# Patient Record
Sex: Male | Born: 1975 | Race: Black or African American | Hispanic: No | Marital: Married | Smoking: Never smoker
Health system: Southern US, Community
[De-identification: ages and names within clinical notes are randomized; demographics above are authoritative.]

## PROBLEM LIST (undated history)

## (undated) DIAGNOSIS — I10 Essential (primary) hypertension: Secondary | ICD-10-CM

## (undated) DIAGNOSIS — J45909 Unspecified asthma, uncomplicated: Secondary | ICD-10-CM

## (undated) DIAGNOSIS — F32A Depression, unspecified: Secondary | ICD-10-CM

## (undated) DIAGNOSIS — K2100 Gastro-esophageal reflux disease with esophagitis, without bleeding: Secondary | ICD-10-CM

## (undated) DIAGNOSIS — F319 Bipolar disorder, unspecified: Secondary | ICD-10-CM

## (undated) DIAGNOSIS — K219 Gastro-esophageal reflux disease without esophagitis: Secondary | ICD-10-CM

## (undated) DIAGNOSIS — F419 Anxiety disorder, unspecified: Secondary | ICD-10-CM

## (undated) HISTORY — PX: NO PAST SURGERIES: SHX2092

## (undated) HISTORY — DX: Gastro-esophageal reflux disease with esophagitis, without bleeding: K21.00

---

## 2012-02-12 ENCOUNTER — Encounter (HOSPITAL_COMMUNITY): Payer: Self-pay | Admitting: *Deleted

## 2012-02-12 ENCOUNTER — Emergency Department (HOSPITAL_COMMUNITY)
Admission: EM | Admit: 2012-02-12 | Discharge: 2012-02-12 | Disposition: A | Discharge status: Home / Self Care | Payer: No Typology Code available for payment source | Attending: Emergency Medicine | Admitting: Emergency Medicine

## 2012-02-12 ENCOUNTER — Emergency Department (HOSPITAL_COMMUNITY): Payer: No Typology Code available for payment source

## 2012-02-12 DIAGNOSIS — Z043 Encounter for examination and observation following other accident: Secondary | ICD-10-CM | POA: Insufficient documentation

## 2012-02-12 MED ORDER — IBUPROFEN 400 MG PO TABS
800.0000 mg | ORAL_TABLET | Freq: Once | ORAL | Status: AC
  Administered 2012-02-12: 800 mg via ORAL
  Filled 2012-02-12: qty 2
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
## 2012-02-12 NOTE — ED Notes (Signed)
Pt returned from xray pain med given
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
## 2013-02-12 ENCOUNTER — Emergency Department (HOSPITAL_COMMUNITY)
Admission: EM | Admit: 2013-02-12 | Discharge: 2013-02-12 | Disposition: A | Discharge status: Home / Self Care | Payer: Self-pay | Attending: Emergency Medicine | Admitting: Emergency Medicine

## 2013-02-12 ENCOUNTER — Encounter (HOSPITAL_COMMUNITY): Payer: Self-pay | Admitting: *Deleted

## 2013-02-12 DIAGNOSIS — H9201 Otalgia, right ear: Secondary | ICD-10-CM

## 2013-02-12 DIAGNOSIS — S335XXA Sprain of ligaments of lumbar spine, initial encounter: Secondary | ICD-10-CM | POA: Insufficient documentation

## 2013-02-12 DIAGNOSIS — K029 Dental caries, unspecified: Secondary | ICD-10-CM

## 2013-02-12 DIAGNOSIS — H9209 Otalgia, unspecified ear: Secondary | ICD-10-CM | POA: Insufficient documentation

## 2013-02-12 DIAGNOSIS — Y9229 Other specified public building as the place of occurrence of the external cause: Secondary | ICD-10-CM | POA: Insufficient documentation

## 2013-02-12 DIAGNOSIS — S29012A Strain of muscle and tendon of back wall of thorax, initial encounter: Secondary | ICD-10-CM

## 2013-02-12 DIAGNOSIS — X503XXA Overexertion from repetitive movements, initial encounter: Secondary | ICD-10-CM | POA: Insufficient documentation

## 2013-02-12 DIAGNOSIS — Y990 Civilian activity done for income or pay: Secondary | ICD-10-CM | POA: Insufficient documentation

## 2013-02-12 MED ORDER — TRAMADOL HCL 50 MG PO TABS: 50.0000 mg | ORAL_TABLET | Freq: Four times a day (QID) | ORAL | Status: DC | PRN

## 2013-02-12 MED ORDER — CYCLOBENZAPRINE HCL 10 MG PO TABS: 10.0000 mg | ORAL_TABLET | Freq: Two times a day (BID) | ORAL | Status: DC | PRN

## 2013-02-12 MED ORDER — IBUPROFEN 600 MG PO TABS: 600.0000 mg | ORAL_TABLET | Freq: Four times a day (QID) | ORAL | Status: DC | PRN

## 2013-02-12 MED ORDER — AMOXICILLIN 500 MG PO CAPS: 500.0000 mg | ORAL_CAPSULE | Freq: Three times a day (TID) | ORAL | Status: DC

## 2013-02-12 NOTE — ED Notes (Signed)
Pt is in no distress 

## 2013-02-12 NOTE — ED Provider Notes (Signed)
CSN: [id number]     Arrival date & time 02/12/13  1331 History   First MD Initiated Contact with Patient 02/12/13 1510     Chief Complaint  Patient presents with  . Otalgia  . Back Pain   (Consider location/radiation/quality/duration/timing/severity/associated sxs/prior Treatment) HPI Jordan [name] is a 37 y.o. male  presented to emergency department with complaint of a right earache and left upper back pain. Patient states his ear ache began 4 days ago. States it feels like pressure. Patient denies any injury or drainage out of the ear. Patient denies any fever, chills, any other URI symptoms. Patient denies any being in his ears. Patient denies any headache. Patient denies any dental pain or sore throat. Patient states that he took some Tylenol with no relief. He should also states he's been having left upper and mid back pain for the past month. Patient states the back pain does not radiate. It is not midline. States it is worsened with movement. States he lifts boxes up to 50 pounds at work daily. He denies any known injury. Patient denies any numbness or weakness in his extremities. Patient denies any loss of bowels or bladder function. Patient denies any fever, chills malaise. History reviewed. No pertinent past medical history. History reviewed. No pertinent past surgical history. History reviewed. No pertinent family history. History  Substance Use Topics  . Smoking status: Never Smoker   . Smokeless tobacco: Not on file  . Alcohol Use: Yes     Comment: occ    Review of Systems  Constitutional: Negative for fever and chills.  HENT: Positive for ear pain. Negative for sore throat, neck pain, neck stiffness and dental problem.   Respiratory: Negative for cough, chest tightness and shortness of breath.   Cardiovascular: Negative for chest pain, palpitations and leg swelling.  Gastrointestinal: Negative for nausea, vomiting, abdominal pain, diarrhea and abdominal distention.   Genitourinary: Negative for dysuria, urgency, frequency, hematuria and flank pain.  Musculoskeletal: Positive for back pain.  Skin: Negative for rash.  Allergic/Immunologic: Negative for immunocompromised state.  Neurological: Negative for dizziness, weakness, light-headedness, numbness and headaches.    Allergies  Review of patient's allergies indicates no known allergies.  Home Medications  No current outpatient prescriptions on file. BP 140/97  Pulse 62  Temp(Src) 97.8 F (36.6 C) (Oral)  Resp 18  SpO2 99% Physical Exam  Nursing note and vitals reviewed. Constitutional: He appears well-developed and well-nourished. No distress.  HENT:  Head: Normocephalic.  Right Ear: External ear normal.  Left Ear: External ear normal.  Nose: Nose normal.  Mouth/Throat: Oropharynx is clear and moist.  Ear canals and tympanic membranes are normal bilaterally. Partially broken off and decayed right lower third molar. No surrounding gum swelling or erythema.  Neck: Normal range of motion. Neck supple.  Cardiovascular: Normal rate, regular rhythm and normal heart sounds.   Pulmonary/Chest: Effort normal and breath sounds normal. No respiratory distress. He has no wheezes. He has no rales.  Abdominal: Soft.  Musculoskeletal:  No midline thoracic or lumbar spine tenderness. Patient is tender over left trapezius and left parascapular muscles. There is no bruising, swelling, erythema. No pain with range of motion of left shoulder.  Neurological:  5/5 and equal lower extremity strength. 2+ and equal patellar reflexes bilaterally. Pt able to dorsiflex bilateral toes and feet with good strength against resistance. Equal sensation bilaterally over thighs and lower legs.   Skin: Skin is warm and dry.    ED Course  Procedures (including critical  care time) Labs Review Labs Reviewed - No data to display Imaging Review No results found.  MDM   1. Ear pain, right   2. Dental cavity   3. Upper  back strain, initial encounter    Patient with 2 complaints. He is having right ear pain and left upper back pain. Ear appears normal, he does have a cavity in his right lower third molar tooth and this could be a referred pain from the tooth. He does not have any facial swelling or any signs of an obvious abscess. Afebrile. He is nontoxic appearing. There are no red flags suggest cauda equina. His back pain is most likely musculoskeletal. I will start him on amoxicillin to cover for possible dental infection. We'll prescribe him pain medication, muscle relaxant,  and instructed him to followup closely with pcp and a dentist.   Filed Vitals:   02/12/13 1359  BP: 140/97  Pulse: 62  Temp: 97.8 F (36.6 C)  Resp: 18       [name] A Robyn Nohr, PA-C 02/13/13 0015

## 2013-02-12 NOTE — ED Notes (Signed)
Pt reports left side lower back pain that radiates up to mid back, has been hurting x1 month. And now also having right ear pain since Monday. Ambulatory, no acute distress noted at triage.

## 2013-02-13 NOTE — ED Provider Notes (Signed)
Medical screening examination/treatment/procedure(s) were performed by non-physician practitioner and as supervising physician I was immediately available for consultation/collaboration.  Adasha Boehme N Cadie Sorci, DO 02/13/13 1458 

## 2013-03-15 ENCOUNTER — Emergency Department (HOSPITAL_COMMUNITY)
Admission: EM | Admit: 2013-03-15 | Discharge: 2013-03-15 | Disposition: A | Discharge status: Home / Self Care | Payer: No Typology Code available for payment source | Attending: Emergency Medicine | Admitting: Emergency Medicine

## 2013-03-15 ENCOUNTER — Encounter (HOSPITAL_COMMUNITY): Payer: Self-pay | Admitting: Emergency Medicine

## 2013-03-15 DIAGNOSIS — H6983 Other specified disorders of Eustachian tube, bilateral: Secondary | ICD-10-CM

## 2013-03-15 DIAGNOSIS — H6980 Other specified disorders of Eustachian tube, unspecified ear: Secondary | ICD-10-CM | POA: Insufficient documentation

## 2013-03-15 DIAGNOSIS — H9203 Otalgia, bilateral: Secondary | ICD-10-CM

## 2013-03-15 DIAGNOSIS — M549 Dorsalgia, unspecified: Secondary | ICD-10-CM

## 2013-03-15 DIAGNOSIS — M545 Low back pain, unspecified: Secondary | ICD-10-CM | POA: Insufficient documentation

## 2013-03-15 DIAGNOSIS — H9209 Otalgia, unspecified ear: Secondary | ICD-10-CM | POA: Insufficient documentation

## 2013-03-15 DIAGNOSIS — H6990 Unspecified Eustachian tube disorder, unspecified ear: Secondary | ICD-10-CM | POA: Insufficient documentation

## 2013-03-15 DIAGNOSIS — Z79899 Other long term (current) drug therapy: Secondary | ICD-10-CM | POA: Insufficient documentation

## 2013-03-15 MED ORDER — HYDROCODONE-ACETAMINOPHEN 5-325 MG PO TABS: 1.0000 | ORAL_TABLET | Freq: Four times a day (QID) | ORAL | Status: DC | PRN

## 2013-03-15 NOTE — ED Provider Notes (Signed)
Medical screening examination/treatment/procedure(s) were performed by non-physician practitioner and as supervising physician I was immediately available for consultation/collaboration.  Tekeya Geffert R. Jeziah Kretschmer, MD 03/15/13 1622 

## 2013-03-15 NOTE — ED Notes (Signed)
Reports having lower back pain, right ear pain and now left ear pain. No distress noted.

## 2013-03-15 NOTE — ED Provider Notes (Signed)
CSN: [id number]     Arrival date & time 03/15/13  1113 History  This chart was scribed for non-physician practitioner, Arthor Captain, PA-C working with Juliet Rude. Rubin Payor, MD by Greggory Stallion, ED scribe. This patient was seen in room TR08C/TR08C and the patient's care was started at 12:11 PM.   Chief Complaint  Patient presents with  . Back Pain  . Otalgia   The history is provided by the patient. No language interpreter was used.   HPI Comments: Jordan [name] is a 37 y.o. male who presents to the Emergency Department complaining of bilateral throbbing otalgia that started 3 weeks ago. Pt has trachel pressure. He states it started in the right ear and moved to the left ear last night. Pt also has aching lower back pain and was previously seen for it and given tramadol. The pain radiates into his buttock and up his back. Pt denies hearing loss, congestion and rhinorrhea.   History reviewed. No pertinent past medical history. History reviewed. No pertinent past surgical history. History reviewed. No pertinent family history. History  Substance Use Topics  . Smoking status: Never Smoker   . Smokeless tobacco: Not on file  . Alcohol Use: Yes     Comment: occ    Review of Systems  HENT: Positive for ear pain. Negative for congestion, hearing loss and rhinorrhea.   Musculoskeletal: Positive for back pain.    Allergies  Review of patient's allergies indicates no known allergies.  Home Medications   Current Outpatient Rx  Name  Route  Sig  Dispense  Refill  . Aspirin-Caffeine (BAYER BACK & BODY PAIN EX ST) 500-32.5 MG TABS   Oral   Take 2 tablets by mouth every 6 (six) hours as needed (pain).         . cyclobenzaprine (FLEXERIL) 10 MG tablet   Oral   Take 1 tablet (10 mg total) by mouth 2 (two) times daily as needed for muscle spasms.   20 tablet   0    BP 124/77  Pulse 69  Temp(Src) 97.5 F (36.4 C) (Oral)  Resp 18  Ht 5\' 8"  (1.727 m)  Wt 165 lb (74.844 kg)  BMI  25.09 kg/m2  SpO2 98%  Physical Exam  Nursing note and vitals reviewed. Constitutional: He is oriented to person, place, and time. He appears well-developed and well-nourished. No distress.  HENT:  Head: Normocephalic and atraumatic.  Right Ear: Tympanic membrane, external ear and ear canal normal.  Left Ear: Tympanic membrane, external ear and ear canal normal.  Mouth/Throat: Oropharynx is clear and moist and mucous membranes are normal.  Eyes: EOM are normal.  Neck: Neck supple. No tracheal deviation present.  Cardiovascular: Normal rate.   Pulmonary/Chest: Effort normal. No respiratory distress.  Musculoskeletal: Normal range of motion.  Lymphadenopathy:    He has no cervical adenopathy.  Neurological: He is alert and oriented to person, place, and time.  Skin: Skin is warm and dry.  Psychiatric: He has a normal mood and affect. His behavior is normal.    ED Course  Procedures (including critical care time)  DIAGNOSTIC STUDIES: Oxygen Saturation is 98% on RA, normal by my interpretation.    COORDINATION OF CARE: 12:16 PM-Discussed treatment plan which includes pain medication with pt at bedside and pt agreed to plan. Advised pt to follow up with [facility].   Labs Review Labs Reviewed - No data to display Imaging Review No results found.  EKG Interpretation   None  MDM   1. Acute otalgia, bilateral   2. Eustachian tube dysfunction, bilateral   3. Back pain    Pt with bilateral otalgia. Suspect eustation tube dysfunction. No signs of otitis. No neck stiffness, signs of meningitis or mastoiditis. Will treat ear pain with opiate. Follow up with [facility] regarding chronic back pain with specialist follow up.     I personally performed the services described in this documentation, which was scribed in my presence. The recorded information has been reviewed and is accurate.    Arthor Captain, PA-C 03/15/13 1432

## 2014-04-25 ENCOUNTER — Encounter (HOSPITAL_COMMUNITY): Payer: Self-pay | Admitting: *Deleted

## 2014-04-25 ENCOUNTER — Emergency Department (HOSPITAL_COMMUNITY)
Admission: EM | Admit: 2014-04-25 | Discharge: 2014-04-25 | Disposition: A | Discharge status: Home / Self Care | Payer: No Typology Code available for payment source | Attending: Emergency Medicine | Admitting: Emergency Medicine

## 2014-04-25 DIAGNOSIS — Z79899 Other long term (current) drug therapy: Secondary | ICD-10-CM | POA: Diagnosis not present

## 2014-04-25 DIAGNOSIS — M25562 Pain in left knee: Secondary | ICD-10-CM | POA: Insufficient documentation

## 2014-04-25 DIAGNOSIS — R21 Rash and other nonspecific skin eruption: Secondary | ICD-10-CM | POA: Diagnosis not present

## 2014-04-25 DIAGNOSIS — Y998 Other external cause status: Secondary | ICD-10-CM | POA: Insufficient documentation

## 2014-04-25 DIAGNOSIS — Y9289 Other specified places as the place of occurrence of the external cause: Secondary | ICD-10-CM | POA: Diagnosis not present

## 2014-04-25 DIAGNOSIS — R05 Cough: Secondary | ICD-10-CM | POA: Diagnosis not present

## 2014-04-25 DIAGNOSIS — Y9389 Activity, other specified: Secondary | ICD-10-CM | POA: Insufficient documentation

## 2014-04-25 DIAGNOSIS — S39012A Strain of muscle, fascia and tendon of lower back, initial encounter: Secondary | ICD-10-CM | POA: Diagnosis not present

## 2014-04-25 DIAGNOSIS — X58XXXA Exposure to other specified factors, initial encounter: Secondary | ICD-10-CM | POA: Insufficient documentation

## 2014-04-25 DIAGNOSIS — M545 Low back pain: Secondary | ICD-10-CM | POA: Diagnosis present

## 2014-04-25 MED ORDER — PREDNISONE 20 MG PO TABS: ORAL_TABLET | ORAL | Status: DC

## 2014-04-25 NOTE — ED Notes (Signed)
Declined W/C at D/C and was escorted to lobby by RN. 

## 2014-04-25 NOTE — ED Provider Notes (Signed)
CSN: [id number]     Arrival date & time 04/25/14  1057 History  This chart was scribed for Raymon MuttonMarissa Milena Liggett, PA-C with Gilda Creasehristopher J. Pollina, MD by Tonye RoyaltyJoshua Chen, ED Scribe. This patient was seen in room TR10C/TR10C and the patient's care was started at 12:37 PM.    Chief Complaint  Patient presents with  . Back Pain  . Leg Pain   HPI  HPI Comments: Jordan [name] is a 38 y.o. male without significant medical history who presents to the Emergency Department complaining of pressure-like, throbbing, non-radiating low back pain with onset 1 month ago. He states it is worse with standing and walking. He reports a moderate amount of heavy lifting at work and notes he wears  a back brace. He denies recent falls or injury. He denies history of kidney stones. He states he had similar back pain 9 months ago, but not as severe. He states he has been using ASA only at home for his pain. He denies incontinence, dysuria, hematuria, or tenderness to touch.  He also complains of throbbing, non-radiating pain behind his left knee with onset 1 month ago. He states he noticed it spontaneously while at work. He denies recent injury, falls, or long travel. He denies history of blood clots. He denies swelling to his left leg, numbness/tingling to his leg or anywhere else, chest pain, shortness of breath, dyspnea, neck pain, neck stiffness, foot pain, or ankle pain.  He also complains of a erythematous rash affecting at first his medial left lower leg, then bilateral thighs, now right arm and back. He states the spots are erythematous but denies pustule, drainage, pain, or itching. He denies insect bite, changes in soap, change in detergent, new medication, new pets, sleeping at someone else's home, recent travel outside of [place], or recent hiking. He notes a cough with onset 3 weeks ago. He denies fever, sore throat, fatigue, energy level change, appetite change, abdominal pain, nausea, or vomiting.  History reviewed. No  pertinent past medical history. History reviewed. No pertinent past surgical history. History reviewed. No pertinent family history. History  Substance Use Topics  . Smoking status: Never Smoker   . Smokeless tobacco: Not on file  . Alcohol Use: Yes     Comment: occ    Review of Systems  Constitutional: Negative for fever, activity change and appetite change.  HENT: Negative for sore throat.   Respiratory: Positive for cough. Negative for apnea and shortness of breath.   Cardiovascular: Negative for chest pain and leg swelling.  Gastrointestinal: Negative for nausea, vomiting and abdominal pain.  Genitourinary: Negative for dysuria and hematuria.  Musculoskeletal: Positive for back pain and arthralgias (posterior knee). Negative for neck pain and neck stiffness.  Skin: Positive for rash.  Neurological: Negative for numbness.      Allergies  Review of patient's allergies indicates no known allergies.  Home Medications   Prior to Admission medications   Medication Sig Start Date End Date Taking? Authorizing Provider  Aspirin-Caffeine (BAYER BACK & BODY PAIN EX ST) 500-32.5 MG TABS Take 2 tablets by mouth every 6 (six) hours as needed (pain).    Historical Provider, MD  cyclobenzaprine (FLEXERIL) 10 MG tablet Take 1 tablet (10 mg total) by mouth 2 (two) times daily as needed for muscle spasms. 02/12/13   Tatyana A Kirichenko, PA-C  HYDROcodone-acetaminophen (NORCO) 5-325 MG per tablet Take 1 tablet by mouth every 6 (six) hours as needed for pain. 03/15/13   Arthor CaptainAbigail Harris, PA-C  predniSONE (DELTASONE) 20  MG tablet 3 tabs po day one, then 2 tabs daily x 4 days 04/25/14   Niomie Englert, PA-C   BP 131/81 mmHg  Pulse 72  Temp(Src) 98.6 F (37 C) (Oral)  Resp 20  Ht 5\' 8"  (1.727 m)  Wt 170 lb (77.111 kg)  BMI 25.85 kg/m2  SpO2 100% Physical Exam  Constitutional: He is oriented to person, place, and time. He appears well-developed and well-nourished. No distress.  HENT:   Head: Normocephalic and atraumatic.  Mouth/Throat: Oropharynx is clear and moist. No oropharyngeal exudate.  Eyes: Conjunctivae and EOM are normal. Pupils are equal, round, and reactive to light. Right eye exhibits no discharge. Left eye exhibits no discharge.  Neck: Normal range of motion. Neck supple. No tracheal deviation present.  Cardiovascular: Normal rate, regular rhythm and normal heart sounds.  Exam reveals no friction rub.   No murmur heard. Pulmonary/Chest: Effort normal and breath sounds normal. No respiratory distress. He has no wheezes. He has no rales.  Musculoskeletal: Normal range of motion. He exhibits tenderness.       Left knee: He exhibits normal range of motion, no swelling, no effusion, no ecchymosis, no deformity and no laceration. Tenderness (posterior aspect) found.       Lumbar back: He exhibits normal range of motion, no tenderness, no bony tenderness, no swelling, no edema, no deformity and no laceration.  Negative swelling, erythema, inflammation, lesions, sores, deformities, bulging identified to the spine. Negative pain upon palpation.  Negative swelling, erythema, inflammation, lesions, sores, deformities identified to the left knee. Mild discomfort upon palpation to the posterior aspect of the left knee. Negative swelling to the left leg. Full range of motion to left lower extremity without difficulty. Negative warmth upon palpation. Negative pain out of portion to exam.  Full ROM to upper and lower extremities without difficulty noted, negative ataxia noted.  Lymphadenopathy:    He has no cervical adenopathy.  Neurological: He is alert and oriented to person, place, and time. No cranial nerve deficit. He exhibits normal muscle tone. Coordination normal.  Cranial nerves III-XII grossly intact Strength 5+/5+ to upper and lower extremities bilaterally with resistance applied, equal distribution noted Negative saddle paresthesias bilaterally Sensation intact with  differentiation to sharp and dull touch Fine motor skills intact 2 point discrimination intact  Heel to knee down shin normal bilaterally Gait proper, proper balance - negative sway, negative drift, negative step-offs  Skin: Rash noted. He is not diaphoretic.  Macules identified to the upper left and right thigh anterior aspect with negative erythema, swelling, drainage, bleeding, pain upon palpation.  Psychiatric: He has a normal mood and affect. His behavior is normal. Thought content normal.  Nursing note and vitals reviewed.   ED Course  Procedures (including critical care time)  DIAGNOSTIC STUDIES: Oxygen Saturation is 97% on room air, normal by my interpretation.    COORDINATION OF CARE: 12:52 PM Discussed treatment plan with patient at beside, the patient agrees with the plan and has no further questions at this time.   Labs Review  Labs Reviewed - No data to display  Imaging Review No results found.   EKG Interpretation None      MDM   Final diagnoses:  Lumbosacral strain, initial encounter  Left knee pain  Rash and nonspecific skin eruption    Medications - No data to display  Filed Vitals:   04/25/14 1106 04/25/14 1303  BP: 131/89 131/81  Pulse: 78 72  Temp: 98 F (36.7 C) 98.6 F (37  C)  TempSrc: Oral Oral  Resp: 18 20  Height: 5\' 8"  (1.727 m)   Weight: 170 lb (77.111 kg)   SpO2: 97% 100%   I personally performed the services described in this documentation, which was scribed in my presence. The recorded information has been reviewed and is accurate.  Patient presenting to the ED with multiple complaints of back pain, left knee pain, rash that had been ongoing for more than a month. Patient reports that he has history of back pain that appears to be similar presentation. Doubt cauda equina. Doubt epidural abscess. Suspicion to be muscular pain secondary to pain with motion. Negative focal neurological deficits identified. Pulses palpable and  strong. Gait proper-negative step-offs or sway. Suspicion to be muscular pain of the left knee-full range of motion noted without difficulty or ataxia. Rash unknown definitive etiology. Patient stable, afebrile. Patient not septic appearing. Discharged patient. Discharge patient with prednisone. [facility] to [facility], dermatologist, orthopedics. Discussed with patient to avoid any physical shortness activity. Discussed with patient to rest and stay hydrated. Discussed with patient to apply warm compressions and massage. Discussed with patient to closely monitor symptoms and if symptoms are to worsen or change to report back to the ED - strict return instructions given.  Patient agreed to plan of care, understood, all questions answered.   Raymon Mutton, PA-C 04/25/14 1336  Gilda Crease, MD 04/29/14 [phone]

## 2014-04-25 NOTE — ED Notes (Signed)
Pt has multiple complaints. Reports having mid back pain x 1 month, now left leg pain and rash to entire body that does not itch. No acute distress noted at triage.

## 2014-04-25 NOTE — Discharge Instructions (Signed)
Please call your doctor for a followup appointment within 24-48 hours. When you talk to your doctor please let them know that you were seen in the emergency department and have them acquire all of your records so that they can discuss the findings with you and formulate a treatment plan to fully care for your new and ongoing problems. Please call and set-up an appointment with Health and wellness Please call and set-up an appointment with Orthopedics Please call and set-up an appointment with Dermatology Please take medication as prescribed and on a full stomach Please avoid any physical strenuous activity Please apply warm compressions and massage Please continue to back brace as needed Please perform stretching exercises Please continue to monitor symptoms closely and if symptoms are to worsen or change (fever greater than 101, chills, sweating, nausea, vomiting, chest pain, shortness of breathe, difficulty breathing, weakness, numbness, tingling, worsening or changes to pain pattern, fall, injury, inability to control urine or bowel movements, swelling to the knee or to the back, loss of sensation, worsening or changes to pain pattern, spreading of the rash or worsening to appearance of rash) please report back to the Emergency Department immediately.   Lumbosacral Strain Lumbosacral strain is a strain of any of the parts that make up your lumbosacral vertebrae. Your lumbosacral vertebrae are the bones that make up the lower third of your backbone. Your lumbosacral vertebrae are held together by muscles and tough, fibrous tissue (ligaments).  CAUSES  A sudden blow to your back can cause lumbosacral strain. Also, anything that causes an excessive stretch of the muscles in the low back can cause this strain. This is typically seen when people exert themselves strenuously, fall, lift heavy objects, bend, or crouch repeatedly. RISK FACTORS  Physically demanding work.  Participation in pushing or  pulling sports or sports that require a sudden twist of the back (tennis, golf, baseball).  Weight lifting.  Excessive lower back curvature.  Forward-tilted pelvis.  Weak back or abdominal muscles or both.  Tight hamstrings. SIGNS AND SYMPTOMS  Lumbosacral strain may cause pain in the area of your injury or pain that moves (radiates) down your leg.  DIAGNOSIS Your health care provider can often diagnose lumbosacral strain through a physical exam. In some cases, you may need tests such as X-ray exams.  TREATMENT  Treatment for your lower back injury depends on many factors that your clinician will have to evaluate. However, most treatment will include the use of anti-inflammatory medicines. HOME CARE INSTRUCTIONS   Avoid hard physical activities (tennis, racquetball, waterskiing) if you are not in proper physical condition for it. This may aggravate or create problems.  If you have a back problem, avoid sports requiring sudden body movements. Swimming and walking are generally safer activities.  Maintain good posture.  Maintain a healthy weight.  For acute conditions, you may put ice on the injured area.  Put ice in a plastic bag.  Place a towel between your skin and the bag.  Leave the ice on for 20 minutes, 2-3 times a day.  When the low back starts healing, stretching and strengthening exercises may be recommended. SEEK MEDICAL CARE IF:  Your back pain is getting worse.  You experience severe back pain not relieved with medicines. SEEK IMMEDIATE MEDICAL CARE IF:   You have numbness, tingling, weakness, or problems with the use of your arms or legs.  There is a change in bowel or bladder control.  You have increasing pain in any area of the  body, including your belly (abdomen).  You notice shortness of breath, dizziness, or feel faint.  You feel sick to your stomach (nauseous), are throwing up (vomiting), or become sweaty.  You notice discoloration of your toes  or legs, or your feet get very cold. MAKE SURE YOU:   Understand these instructions.  Will watch your condition.  Will get help right away if you are not doing well or get worse. Document Released: 02/21/2005 Document Revised: 05/19/2013 Document Reviewed: 12/31/2012 [facility] Patient Information 2015 [place], [place]. This information is not intended to replace advice given to you by your health care provider. Make sure you discuss any questions you have with your health care provider.  Rash A rash is a change in the color or texture of your skin. There are many different types of rashes. You may have other problems that accompany your rash. CAUSES   Infections.  Allergic reactions. This can include allergies to pets or foods.  Certain medicines.  Exposure to certain chemicals, soaps, or cosmetics.  Heat.  Exposure to poisonous plants.  Tumors, both cancerous and noncancerous. SYMPTOMS   Redness.  Scaly skin.  Itchy skin.  Dry or cracked skin.  Bumps.  Blisters.  Pain. DIAGNOSIS  Your caregiver may do a physical exam to determine what type of rash you have. A skin sample (biopsy) may be taken and examined under a microscope. TREATMENT  Treatment depends on the type of rash you have. Your caregiver may prescribe certain medicines. For serious conditions, you may need to see a skin doctor (dermatologist). HOME CARE INSTRUCTIONS   Avoid the substance that caused your rash.  Do not scratch your rash. This can cause infection.  You may take cool baths to help stop itching.  Only take over-the-counter or prescription medicines as directed by your caregiver.  Keep all follow-up appointments as directed by your caregiver. SEEK IMMEDIATE MEDICAL CARE IF:  You have increasing pain, swelling, or redness.  You have a fever.  You have new or severe symptoms.  You have body aches, diarrhea, or vomiting.  Your rash is not better after 3 days. MAKE SURE  YOU:  Understand these instructions.  Will watch your condition.  Will get help right away if you are not doing well or get worse. Document Released: 05/04/2002 Document Revised: 08/06/2011 Document Reviewed: 02/26/2011 [facility] Patient Information 2015 [place], [place]. This information is not intended to replace advice given to you by your health care provider. Make sure you discuss any questions you have with your health care provider.  Back Exercises Back exercises help treat and prevent back injuries. The goal of back exercises is to increase the strength of your abdominal and back muscles and the flexibility of your back. These exercises should be started when you no longer have back pain. Back exercises include:  Pelvic Tilt. Lie on your back with your knees bent. Tilt your pelvis until the lower part of your back is against the floor. Hold this position 5 to 10 sec and repeat 5 to 10 times.  Knee to Chest. Pull first 1 knee up against your chest and hold for 20 to 30 seconds, repeat this with the other knee, and then both knees. This may be done with the other leg straight or bent, whichever feels better.  Sit-Ups or Curl-Ups. Bend your knees 90 degrees. Start with tilting your pelvis, and do a partial, slow sit-up, lifting your trunk only 30 to 45 degrees off the floor. Take at least 2 to  3 seconds for each sit-up. Do not do sit-ups with your knees out straight. If partial sit-ups are difficult, simply do the above but with only tightening your abdominal muscles and holding it as directed.  Hip-Lift. Lie on your back with your knees flexed 90 degrees. Push down with your feet and shoulders as you raise your hips a couple inches off the floor; hold for 10 seconds, repeat 5 to 10 times.  Back arches. Lie on your stomach, propping yourself up on bent elbows. Slowly press on your hands, causing an arch in your low back. Repeat 3 to 5 times. Any initial stiffness and discomfort should lessen  with repetition over time.  Shoulder-Lifts. Lie face down with arms beside your body. Keep hips and torso pressed to floor as you slowly lift your head and shoulders off the floor. Do not overdo your exercises, especially in the beginning. Exercises may cause you some mild back discomfort which lasts for a few minutes; however, if the pain is more severe, or lasts for more than 15 minutes, do not continue exercises until you see your caregiver. Improvement with exercise therapy for back problems is slow.  See your caregivers for assistance with developing a proper back exercise program. Document Released: 06/21/2004 Document Revised: 08/06/2011 Document Reviewed: 03/15/2011 [facility] Patient Information 2015 [employer]. This information is not intended to replace advice given to you by your health care provider. Make sure you discuss any questions you have with your health care provider.   Emergency Department Resource Guide 1) Find a Doctor and Pay Out of Pocket Although you won't have to find out who is covered by your insurance plan, it is a good idea to ask around and get recommendations. You will then need to call the office and see if the doctor you have chosen will accept you as a new patient and what types of options they offer for patients who are self-pay. Some doctors offer discounts or will set up payment plans for their patients who do not have insurance, but you will need to ask so you aren't surprised when you get to your appointment.  2) Contact Your Local Health Department Not all health departments have doctors that can see patients for sick visits, but many do, so it is worth a call to see if yours does. If you don't know where your local health department is, you can check in your phone book. The CDC also has a tool to help you locate your state's health department, and many state websites also have listings of all of their local health departments.  3) Find a Walk-in  Clinic If your illness is not likely to be very severe or complicated, you may want to try a walk in clinic. These are popping up all over the country in pharmacies, drugstores, and shopping centers. They're usually staffed by nurse practitioners or physician assistants that have been trained to treat common illnesses and complaints. They're usually fairly quick and inexpensive. However, if you have serious medical issues or chronic medical problems, these are probably not your best option.  No Primary Care Doctor: - Call Health Connect at  [phone] - they can help you locate a primary care doctor that  accepts your insurance, provides certain services, etc. - Physician Referral Service- [phone]  Chronic Pain Problems: Organization         Address  Phone   Notes  [facility]  [phone]) [phone] Patients need to be referred by their  primary care doctor.   Medication Assistance: Organization         Address  Phone   Notes  [facility] [place] Medication [facility] [address] [place], [place] [address] [phone] --Must be a resident of [facility] -- Must have NO insurance coverage whatsoever (no Medicaid/ Medicare, etc.) -- The pt. MUST have a primary care doctor that directs their care regularly and follows them in the community   MedAssist  [phone]   [employer]  [phone]    Agencies that provide inexpensive medical care: Organization         Address  Phone   Notes  [facility]  [phone]   [facility]    [phone]   [facility] [address] [place], [place] [address] [phone]) [phone]   [facility] [place] [address], [place] [phone]   Planned Parenthood    [phone]   [facility]    [phone]   [facility]  [address], [place] Phone:  [phone], Fax:  [phone] Hours  of Operation:  9 am - 6 pm, M-F.  Also accepts Medicaid/Medicare and self-pay.  [facility]  [address], [place] Phone: [phone]) [phone], Fax: [phone](336) [phone]. Hours of Operation:  8:30 am - 5:30 pm, M-F.  Also accepts Medicaid and self-pay.  [facility] [place] [address], [place] Phone: [phone]   [facility] [address], [place], [place] [phone], Ext. [phone] Mondays & Thursdays: 7-9 AM.  First 15 patients are seen on a first come, first serve basis.    Medicaid-accepting [facility] [place] Providers:  Organization         Address  Phone   Notes  [facility] [address], Post [phone]) [phone] Also accepts self-pay patients.  [facility] [address] [place], [place]  [phone]   [facility] [address], [place] [phone]) [phone]   [facility] [address] [place] [address], [place] [phone]) [phone]   Renaye RakersVeita Bland [address], [place]   [phone] Only accepts [place] Access [place] patients after they have their name applied to their card.   Self-Pay (no insurance) in [facility]:  Organization         Address  Phone   Notes  Sickle Cell Patients, [facility] [address], [place] [phone]) [phone]   [facility] [address], [place] [phone]   [facility] [place]  [address], [place] [phone]) [phone]   [facility]/Dr. Osei-Bonsu  [address] [place] [address], [place] or [phone], [place] [phone]) [phone] Phone number for both [place] and [place] locations is the same.  [facility] [address], [place] [phone]) [phone]   [facility] [place] [address] [place] [address], [place] or [address] [phone](336) [phone]   [facility] [address], [place] [phone]) [phone], phone; [phone], fax Sees patients 1st and 3rd Saturday of every month.  Must not qualify for public or private insurance (i.e. Medicaid, Medicare, [place] Health Choice, Veterans' Benefits)  Household income should be no more than 200%  of the poverty level The clinic cannot treat you if you are pregnant or think you are pregnant  Sexually transmitted diseases are not treated at the clinic.    Dental Care: Organization         Address  Phone  Notes  [facility] [address] [place], [place] [phone] Accepts children up to age 81 who are enrolled in [place] or [place] Health Choice; pregnant women with a Medicaid card; and children who have applied for Medicaid or [place] Health Choice, but were declined, whose parents can pay a reduced fee at time of service.  [facility] of [facility]  [address], [place] [phone] Accepts children up to age 40 who are enrolled in [place] or [place] Health Choice; pregnant women with a Medicaid card; and children who have applied for Medicaid or [place] Health Choice, but were declined, whose parents can pay a reduced fee at time of service.  [facility]  [address] [place], [place] [phone] Patients are seen by appointment only. Walk-ins are not accepted. [facility] will see patients 53 years of age and older. Monday - Tuesday (8am-5pm) Most Wednesdays (8:30-5pm) $30 per visit, cash only  [facility] Access [facility]  [address], [facility] [phone] Patients are seen by appointment only. Walk-ins are not accepted. [facility] will see patients 79 years of age and older. One Wednesday Evening (Monthly: Volunteer Based).  $30 per visit, cash only  [employer]  [phone] for adults; Children under age 67, call Graduate  Pediatric Dentistry at [phone]. Children aged 80-14, please call [phone] to request a pediatric application.  Dental services are provided in all areas of dental care including fillings, crowns and bridges, complete and partial dentures, implants, gum treatment, root canals, and extractions. Preventive care is also provided. Treatment is provided to both adults and children. Patients are selected via a lottery and there is often a waiting list.   [facility] [address], [place]  [phone] [email or web address]   [facility] [address] [place], [place] [phone], Ext. [phone] Second and Fourth Thursday of each month, opens at 6:30 AM; Clinic ends at 9 AM.  Patients are seen on a first-come first-served basis, and a limited number are seen during each clinic.   [facility]  [address] [place], [place] [phone]   Eligibility Requirements You must have lived in [place], [place], or [place] counties for at least the last three months.   You cannot be eligible for state or federal sponsored [place], including [employer], [place], or [employer].   You generally cannot be eligible for healthcare insurance through your employer.    How to apply: Eligibility screenings are held every Tuesday and Wednesday afternoon from 1:00 pm until 4:00 pm. You do not need an appointment for the interview!  [facility] [address] [place], [place] [phone]   [facility] Health Department  [phone]   [facility] Health Department  [phone]   [facility] Health Department  [phone]    Behavioral Health Resources in the Community: Intensive Outpatient Programs Organization         Address  Phone  Notes  [facility] [address] [place], [place] [phone]   [facility]patient [address], [place], [place]  [phone]   [facility] [address] [place], [place]  [phone]   [facility] [phone] [address],  [place], [place] [phone] or [phone]   Substance Abuse Resources Organization         Address  Phone  Notes  Alcohol and Drug Services  [phone]   [facility]  [phone]   The [place]  [phone]   Floydene Flock  [phone]   Residential & Outpatient Substance Abuse Program  [phone]   Psychological Services Organization         Address  Phone  Notes  [facility] Health  [phone]   [facility] Services  [phone]   [facility] Mental Health [address] [place] [phone] or [phone]    Mobile Crisis Teams Organization         Address  Phone  Notes  [facility], Mobile Crisis Care Unit  [phone]   [facility]  [address] [place], [place] [phone]   Doristine Locks [address] [place] [phone]    Self-Help/Support Groups Organization         Address  Phone             Notes  [facility] of [place] - variety of support groups  [phone]- I7437963 Call for more information  Narcotics Anonymous (NA), [facility] [address], [employer] [place]  2 meetings at this location   Statistician         Address  Phone  Notes  [facility] [address],    [place]  [phone]   [facility]  [address], [place] [address], [place], [place] [phone]   [facility] [address] [place] [address] [place], [place] [phone] Admissions: 8am-3pm M-F  [facility] [address]    [place], [place] [phone]   [facility] [address] [place], [place], [place] [phone]   [facility] [address]  [place], [place] [phone]   [facility] - Intensive Outpatient [address], [place], [place] [phone]   [facility] ([facility] [address] [place].,  [place], [place] [phone] or [phone]   [facility] ([facility]) [address] [place], [place] [phone] Accepts Medicaid  [facility] [place] [address],  [place] [phone] Substance Abuse/Addiction Treatment   [facility] Organization         Address  Phone  Notes  [facility]  [phone]   Angie Fava, PhD [address] [place], [place]   [phone] or [phone]   [facility] Behavioral   [address] [place], [place] [phone]   [facility] [address], [place], [place] [phone] Insurance/Medicaid/sponsorship through [facility] [address]                                    [place], [place] [phone] Therapy/tele-psych/case  [facility] [address], [place] [phone]    Dr. Lolly Mustache  [phone]   [facility] [place]  [employer] [facility] Dept. 1) [address] [place] 2) [address] [place] 3)  [address],  [place] [phone]  [phone]   [place] [phone] or [phone] (After Hours)

## 2014-12-22 ENCOUNTER — Emergency Department (HOSPITAL_COMMUNITY)
Admission: EM | Admit: 2014-12-22 | Discharge: 2014-12-22 | Disposition: A | Discharge status: Home / Self Care | Payer: No Typology Code available for payment source | Attending: Emergency Medicine | Admitting: Emergency Medicine

## 2014-12-22 ENCOUNTER — Encounter (HOSPITAL_COMMUNITY): Payer: Self-pay | Admitting: *Deleted

## 2014-12-22 DIAGNOSIS — R1110 Vomiting, unspecified: Secondary | ICD-10-CM | POA: Diagnosis not present

## 2014-12-22 DIAGNOSIS — Z79899 Other long term (current) drug therapy: Secondary | ICD-10-CM | POA: Diagnosis not present

## 2014-12-22 DIAGNOSIS — R51 Headache: Secondary | ICD-10-CM | POA: Insufficient documentation

## 2014-12-22 DIAGNOSIS — Z7982 Long term (current) use of aspirin: Secondary | ICD-10-CM | POA: Diagnosis not present

## 2014-12-22 DIAGNOSIS — R109 Unspecified abdominal pain: Secondary | ICD-10-CM | POA: Diagnosis present

## 2014-12-22 DIAGNOSIS — R197 Diarrhea, unspecified: Secondary | ICD-10-CM | POA: Insufficient documentation

## 2014-12-22 LAB — COMPREHENSIVE METABOLIC PANEL
ALK PHOS: 57 U/L (ref 38–126)
ALT: 33 U/L (ref 17–63)
AST: 25 U/L (ref 15–41)
Albumin: 4.2 g/dL (ref 3.5–5.0)
Anion gap: 8 (ref 5–15)
BUN: 14 mg/dL (ref 6–20)
CHLORIDE: 102 mmol/L (ref 101–111)
CO2: 28 mmol/L (ref 22–32)
CREATININE: 1.07 mg/dL (ref 0.61–1.24)
Calcium: 9.2 mg/dL (ref 8.9–10.3)
GFR calc Af Amer: >60 mL/min (ref >60)
GLUCOSE: 115 mg/dL — AB (ref 65–99)
Potassium: 3.8 mmol/L (ref 3.5–5.1)
Sodium: 138 mmol/L (ref 135–145)
Total Bilirubin: 0.7 mg/dL (ref 0.3–1.2)
Total Protein: 8.1 g/dL (ref 6.5–8.1)

## 2014-12-22 LAB — CBC
HEMATOCRIT: 43.3 % (ref 39.0–52.0)
Hemoglobin: 14.4 g/dL (ref 13.0–17.0)
MCH: 29.9 pg (ref 26.0–34.0)
MCHC: 33.3 g/dL (ref 30.0–36.0)
MCV: 90 fL (ref 78.0–100.0)
PLATELETS: 266 10*3/uL (ref 150–400)
RBC: 4.81 MIL/uL (ref 4.22–5.81)
RDW: 13.2 % (ref 11.5–15.5)
WBC: 4.7 10*3/uL (ref 4.0–10.5)

## 2014-12-22 LAB — LIPASE, BLOOD: LIPASE: 15 U/L — AB (ref 22–51)

## 2014-12-22 MED ORDER — PROMETHAZINE HCL 25 MG PO TABS: 25.0000 mg | ORAL_TABLET | Freq: Four times a day (QID) | ORAL | Status: DC | PRN

## 2014-12-22 MED ORDER — LOPERAMIDE HCL 2 MG PO CAPS
2.0000 mg | ORAL_CAPSULE | Freq: Once | ORAL | Status: AC
  Administered 2014-12-22: 2 mg via ORAL
  Filled 2014-12-22: qty 1

## 2014-12-22 MED ORDER — LOPERAMIDE HCL 2 MG PO CAPS: 2.0000 mg | ORAL_CAPSULE | Freq: Four times a day (QID) | ORAL | Status: DC | PRN

## 2014-12-22 MED ORDER — ONDANSETRON 4 MG PO TBDP
4.0000 mg | ORAL_TABLET | Freq: Once | ORAL | Status: AC
  Administered 2014-12-22: 4 mg via ORAL
  Filled 2014-12-22: qty 1

## 2014-12-22 NOTE — ED Notes (Signed)
Pt reports abd pain, nausea and diarrhea.

## 2014-12-22 NOTE — ED Provider Notes (Signed)
CSN: [id number]     Arrival date & time 12/22/14  1316 History   First MD Initiated Contact with Patient 12/22/14 1607     Chief Complaint  Patient presents with  . Abdominal Pain  . Headache  . Diarrhea     Patient is a 38 y.o. male presenting with abdominal pain, headaches, and diarrhea.  Abdominal Pain Associated symptoms: diarrhea   Headache Associated symptoms: abdominal pain and diarrhea   Diarrhea Associated symptoms: abdominal pain and headaches    Last night pt suddenly had multiple episodes of watery diarrhea.  He was able to go to sleep but when he woke up he had multiple episodes of diarrhea again.  Maybe 10 times.  Last night he had abominal cramping.  Not today.   Mild headache. No blood in stool.  Nausea but no vomiting.  No fever or trouble urinating.  He tried pepto bismol and ibuprofen with minimal relief. History reviewed. No pertinent past medical history. History reviewed. No pertinent past surgical history. History reviewed. No pertinent family history. History  Substance Use Topics  . Smoking status: Never Smoker   . Smokeless tobacco: Not on file  . Alcohol Use: Yes     Comment: occ    Review of Systems  Gastrointestinal: Positive for abdominal pain and diarrhea.  Neurological: Positive for headaches.  All other systems reviewed and are negative.     Allergies  Review of patient's allergies indicates no known allergies.  Home Medications   Prior to Admission medications   Medication Sig Start Date End Date Taking? Authorizing Provider  Aspirin-Caffeine (BAYER BACK & BODY PAIN EX ST) 500-32.5 MG TABS Take 2 tablets by mouth every 6 (six) hours as needed (pain).   Yes Historical Provider, MD  ibuprofen (ADVIL,MOTRIN) 200 MG tablet Take 400-800 mg by mouth every 6 (six) hours as needed for mild pain.   Yes Historical Provider, MD  loperamide (IMODIUM) 2 MG capsule Take 1 capsule (2 mg total) by mouth 4 (four) times daily as needed for diarrhea or  loose stools. 12/22/14   Linwood Dibbles, MD  promethazine (PHENERGAN) 25 MG tablet Take 1 tablet (25 mg total) by mouth every 6 (six) hours as needed for nausea or vomiting. 12/22/14   Linwood Dibbles, MD   BP 125/89 mmHg  Pulse 59  Temp(Src) 98 F (36.7 C) (Oral)  Resp 18  Ht  (1.778 m)  Wt 185 lb (83.915 kg)  BMI 26.54 kg/m2  SpO2 100% Physical Exam  Constitutional: He appears well-developed and well-nourished. No distress.  HENT:  Head: Normocephalic and atraumatic.  Right Ear: External ear normal.  Left Ear: External ear normal.  Eyes: Conjunctivae are normal. Right eye exhibits no discharge. Left eye exhibits no discharge. No scleral icterus.  Neck: Neck supple. No tracheal deviation present.  Cardiovascular: Normal rate, regular rhythm and intact distal pulses.   Pulmonary/Chest: Effort normal and breath sounds normal. No stridor. No respiratory distress. He has no wheezes. He has no rales.  Abdominal: Soft. Bowel sounds are normal. He exhibits no distension. There is no tenderness. There is no rebound and no guarding.  Musculoskeletal: He exhibits no edema or tenderness.  Neurological: He is alert. He has normal strength. No cranial nerve deficit (no facial droop, extraocular movements intact, no slurred speech) or sensory deficit. He exhibits normal muscle tone. He displays no seizure activity. Coordination normal.  Skin: Skin is warm and dry. No rash noted.  Psychiatric: He has a normal mood and  affect.  Nursing note and vitals reviewed.   ED Course  Procedures (including critical care time) Labs Review Labs Reviewed  LIPASE, BLOOD - Abnormal; Notable for the following:    Lipase 15 (*)    All other components within normal limits  COMPREHENSIVE METABOLIC PANEL - Abnormal; Notable for the following:    Glucose, Bld 115 (*)    All other components within normal limits  CBC  URINALYSIS, ROUTINE W REFLEX MICROSCOPIC (NOT AT [facility])   Medications  ondansetron (ZOFRAN-ODT)  disintegrating tablet 4 mg (4 mg Oral Given 12/22/14 1700)  loperamide (IMODIUM) capsule 2 mg (2 mg Oral Given 12/22/14 1700)    MDM   Final diagnoses:  Vomiting and diarrhea   Most likely viral illness.  Improved with treatment.  At this time there does not appear to be any evidence of an acute emergency medical condition and the patient appears stable for discharge with appropriate outpatient follow up.     Linwood Dibbles, MD 12/23/14 [phone]

## 2014-12-22 NOTE — Discharge Instructions (Signed)

## 2015-02-03 ENCOUNTER — Encounter (HOSPITAL_COMMUNITY): Payer: Self-pay | Admitting: *Deleted

## 2015-02-03 ENCOUNTER — Emergency Department (HOSPITAL_COMMUNITY)
Admission: EM | Admit: 2015-02-03 | Discharge: 2015-02-03 | Disposition: A | Discharge status: Home / Self Care | Payer: No Typology Code available for payment source | Attending: Emergency Medicine | Admitting: Emergency Medicine

## 2015-02-03 DIAGNOSIS — R1013 Epigastric pain: Secondary | ICD-10-CM | POA: Diagnosis present

## 2015-02-03 DIAGNOSIS — K2100 Gastro-esophageal reflux disease with esophagitis, without bleeding: Secondary | ICD-10-CM

## 2015-02-03 LAB — COMPREHENSIVE METABOLIC PANEL
ALK PHOS: 60 U/L (ref 38–126)
ALT: 32 U/L (ref 17–63)
AST: 29 U/L (ref 15–41)
Albumin: 4.1 g/dL (ref 3.5–5.0)
Anion gap: 6 (ref 5–15)
BUN: 15 mg/dL (ref 6–20)
CALCIUM: 9.4 mg/dL (ref 8.9–10.3)
CO2: 28 mmol/L (ref 22–32)
CREATININE: 1.11 mg/dL (ref 0.61–1.24)
Chloride: 104 mmol/L (ref 101–111)
GFR calc non Af Amer: >60 mL/min (ref >60)
GLUCOSE: 108 mg/dL — AB (ref 65–99)
Potassium: 4.1 mmol/L (ref 3.5–5.1)
SODIUM: 138 mmol/L (ref 135–145)
Total Bilirubin: 0.4 mg/dL (ref 0.3–1.2)
Total Protein: 7.6 g/dL (ref 6.5–8.1)

## 2015-02-03 LAB — URINALYSIS, ROUTINE W REFLEX MICROSCOPIC
BILIRUBIN URINE: NEGATIVE
GLUCOSE, UA: NEGATIVE mg/dL
HGB URINE DIPSTICK: NEGATIVE
KETONES UR: NEGATIVE mg/dL
Leukocytes, UA: NEGATIVE
Nitrite: NEGATIVE
PROTEIN: NEGATIVE mg/dL
Specific Gravity, Urine: 1.028 (ref 1.005–1.030)
Urobilinogen, UA: 1 mg/dL (ref 0.0–1.0)
pH: 6 (ref 5.0–8.0)

## 2015-02-03 LAB — CBC
HCT: 42.7 % (ref 39.0–52.0)
Hemoglobin: 13.9 g/dL (ref 13.0–17.0)
MCH: 29.6 pg (ref 26.0–34.0)
MCHC: 32.6 g/dL (ref 30.0–36.0)
MCV: 91 fL (ref 78.0–100.0)
PLATELETS: 258 10*3/uL (ref 150–400)
RBC: 4.69 MIL/uL (ref 4.22–5.81)
RDW: 13.4 % (ref 11.5–15.5)
WBC: 4.9 10*3/uL (ref 4.0–10.5)

## 2015-02-03 LAB — LIPASE, BLOOD: Lipase: 19 U/L — ABNORMAL LOW (ref 22–51)

## 2015-02-03 NOTE — ED Provider Notes (Signed)
CSN: [id number]     Arrival date & time 02/03/15  1458 History   First MD Initiated Contact with Patient 02/03/15 1627     Chief Complaint  Patient presents with  . Abdominal Pain     (Consider location/radiation/quality/duration/timing/severity/associated sxs/prior Treatment) HPI Complains of epigastric pain radiating to his throat onset April 2016. Pain typically occurs 20 minutes after eating. And improves after 1 or 2 hours. He's been treating himself with palms and with Pepto-Bismol. He also use milk of magnesia 2 days ago and had 3 "runny stools after treating himself with milk of magnesia. He denies fever denies urinary symptoms denies other associated symptoms. Presently asymptomatic. No past medical history on file. past medical history negative History reviewed. No pertinent past surgical history. No family history on file. Social History  Substance Use Topics  . Smoking status: Never Smoker   . Smokeless tobacco: None  . Alcohol Use: Yes     Comment: occ    Review of Systems  Constitutional: Negative.   HENT: Negative.   Respiratory: Negative.   Cardiovascular: Negative.   Gastrointestinal: Positive for abdominal pain.  Musculoskeletal: Negative.   Skin: Negative.   Neurological: Negative.   Psychiatric/Behavioral: Negative.   All other systems reviewed and are negative.     Allergies  Review of patient's allergies indicates no known allergies.  Home Medications   Prior to Admission medications   Medication Sig Start Date End Date Taking? Authorizing Provider  ibuprofen (ADVIL,MOTRIN) 200 MG tablet Take 400-800 mg by mouth every 6 (six) hours as needed for mild pain.   Yes Historical Provider, MD  naproxen sodium (ANAPROX) 220 MG tablet Take 220 mg by mouth 2 (two) times daily as needed (pain).   Yes Historical Provider, MD  loperamide (IMODIUM) 2 MG capsule Take 1 capsule (2 mg total) by mouth 4 (four) times daily as needed for diarrhea or loose  stools. Patient not taking: Reported on 02/03/2015 12/22/14   Linwood Dibbles, MD  promethazine (PHENERGAN) 25 MG tablet Take 1 tablet (25 mg total) by mouth every 6 (six) hours as needed for nausea or vomiting. Patient not taking: Reported on 02/03/2015 12/22/14   Linwood Dibbles, MD   BP 127/89 mmHg  Pulse 64  Temp(Src) 98.2 F (36.8 C) (Oral)  Resp 20  SpO2 98% Physical Exam  Constitutional: He appears well-developed and well-nourished.  HENT:  Head: Normocephalic and atraumatic.  Eyes: Conjunctivae are normal. Pupils are equal, round, and reactive to light.  Neck: Neck supple. No tracheal deviation present. No thyromegaly present.  Cardiovascular: Normal rate and regular rhythm.   No murmur heard. Pulmonary/Chest: Effort normal and breath sounds normal.  Abdominal: Soft. Bowel sounds are normal. He exhibits no distension. There is no tenderness.  Musculoskeletal: Normal range of motion. He exhibits no edema or tenderness.  Neurological: He is alert. Coordination normal.  Skin: Skin is warm and dry. No rash noted.  Psychiatric: He has a normal mood and affect.  Nursing note and vitals reviewed.   ED Course  Procedures (including critical care time) Labs Review Labs Reviewed  LIPASE, BLOOD - Abnormal; Notable for the following:    Lipase 19 (*)    All other components within normal limits  COMPREHENSIVE METABOLIC PANEL - Abnormal; Notable for the following:    Glucose, Bld 108 (*)    All other components within normal limits  URINALYSIS, ROUTINE W REFLEX MICROSCOPIC (NOT AT [facility]) - Abnormal; Notable for the following:    APPearance CLOUDY (*)  All other components within normal limits  CBC    Imaging Review No results found. I have personally reviewed and evaluated these images and lab results as part of my medical decision-making.   EKG Interpretation None     Results for orders placed or performed during the hospital encounter of 02/03/15  Lipase, blood  Result Value Ref  Range   Lipase 19 (L) 22 - 51 U/L  Comprehensive metabolic panel  Result Value Ref Range   Sodium 138 135 - 145 mmol/L   Potassium 4.1 3.5 - 5.1 mmol/L   Chloride 104 101 - 111 mmol/L   CO2 28 22 - 32 mmol/L   Glucose, Bld 108 (H) 65 - 99 mg/dL   BUN 15 6 - 20 mg/dL   Creatinine, Ser 0.98 0.61 - 1.24 mg/dL   Calcium 9.4 8.9 - 11.9 mg/dL   Total Protein 7.6 6.5 - 8.1 g/dL   Albumin 4.1 3.5 - 5.0 g/dL   AST 29 15 - 41 U/L   ALT 32 17 - 63 U/L   Alkaline Phosphatase 60 38 - 126 U/L   Total Bilirubin 0.4 0.3 - 1.2 mg/dL   GFR calc non Af Amer >60 >60 mL/min   GFR calc Af Amer >60 >60 mL/min   Anion gap 6 5 - 15  CBC  Result Value Ref Range   WBC 4.9 4.0 - 10.5 K/uL   RBC 4.69 4.22 - 5.81 MIL/uL   Hemoglobin 13.9 13.0 - 17.0 g/dL   HCT 14.7 82.9 - 56.2 %   MCV 91.0 78.0 - 100.0 fL   MCH 29.6 26.0 - 34.0 pg   MCHC 32.6 30.0 - 36.0 g/dL   RDW 13.0 86.5 - 78.4 %   Platelets 258 150 - 400 K/uL  Urinalysis, Routine w reflex microscopic (not at [facility])  Result Value Ref Range   Color, Urine YELLOW YELLOW   APPearance CLOUDY (A) CLEAR   Specific Gravity, Urine 1.028 1.005 - 1.030   pH 6.0 5.0 - 8.0   Glucose, UA NEGATIVE NEGATIVE mg/dL   Hgb urine dipstick NEGATIVE NEGATIVE   Bilirubin Urine NEGATIVE NEGATIVE   Ketones, ur NEGATIVE NEGATIVE mg/dL   Protein, ur NEGATIVE NEGATIVE mg/dL   Urobilinogen, UA 1.0 0.0 - 1.0 mg/dL   Nitrite NEGATIVE NEGATIVE   Leukocytes, UA NEGATIVE NEGATIVE   No results found.  MDM  Strongly suspect GERD. Patient told to stop milk of magnesia since it is temporally related to runny stools. Also told to avoid alcohol. Avoid greasy and fatty foods. He is no longer taking ibuprofen or Anaprox. I've referred him to the resource guide and [facility] to get primary care physician. Also suggest take Prilosec OTC Diagnosis esophagitis Final diagnoses:  None        Doug Sou, MD 02/03/15 [phone]

## 2015-02-03 NOTE — Discharge Instructions (Signed)
Esophagitis Use Prilosec OTC as directed. Stop milk of magnesia. Sleep on 2 pillows. Avoid greasy and fatty foods. Call the [facility] and community wellness Center tomorrow to get a primary care physician or any of the numbers on the resource guide. You may need referral to a specialist called a gastroenterologist if you're not better within a few weeks. Return if your condition worsens for any reason. Esophagitis is inflammation of the esophagus. It can involve swelling, soreness, and pain in the esophagus. This condition can make it difficult and painful to swallow. CAUSES  Most causes of esophagitis are not serious. Many different factors can cause esophagitis, including:  Gastroesophageal reflux disease (GERD). This is when acid from your stomach flows up into the esophagus.  Recurrent vomiting.  An allergic-type reaction.  Certain medicines, especially those that come in large pills.  Ingestion of harmful chemicals, such as household cleaning products.  Heavy alcohol use.  An infection of the esophagus.  Radiation treatment for cancer.  Certain diseases such as sarcoidosis, Crohn's disease, and scleroderma. These diseases may cause recurrent esophagitis. SYMPTOMS   Trouble swallowing.  Painful swallowing.  Chest pain.  Difficulty breathing.  Nausea.  Vomiting.  Abdominal pain. DIAGNOSIS  Your caregiver will take your history and do a physical exam. Depending upon what your caregiver finds, certain tests may also be done, including:  Barium X-ray. You will drink a solution that coats the esophagus, and X-rays will be taken.  Endoscopy. A lighted tube is put down the esophagus so your caregiver can examine the area.  Allergy tests. These can sometimes be arranged through follow-up visits. TREATMENT  Treatment will depend on the cause of your esophagitis. In some cases, steroids or other medicines may be given to help relieve your symptoms or to treat the underlying  cause of your condition. Medicines that may be recommended include:  Viscous lidocaine, to soothe the esophagus.  Antacids.  Acid reducers.  Proton pump inhibitors.  Antiviral medicines for certain viral infections of the esophagus.  Antifungal medicines for certain fungal infections of the esophagus.  Antibiotic medicines, depending on the cause of the esophagitis. HOME CARE INSTRUCTIONS   Avoid foods and drinks that seem to make your symptoms worse.  Eat small, frequent meals instead of large meals.  Avoid eating for the 3 hours prior to your bedtime.  If you have trouble taking pills, use a pill splitter to decrease the size and likelihood of the pill getting stuck or injuring the esophagus on the way down. Drinking water after taking a pill also helps.  Stop smoking if you smoke.  Maintain a healthy weight.  Wear loose-fitting clothing. Do not wear anything tight around your waist that causes pressure on your stomach.  Raise the head of your bed 6 to 8 inches with wood blocks to help you sleep. Extra pillows will not help.  Only take over-the-counter or prescription medicines as directed by your caregiver. SEEK IMMEDIATE MEDICAL CARE IF:  You have severe chest pain that radiates into your arm, neck, or jaw.  You feel sweaty, dizzy, or lightheaded.  You have shortness of breath.  You vomit blood.  You have difficulty or pain with swallowing.  You have bloody or black, tarry stools.  You have a fever.  You have a burning sensation in the chest more than 3 times a week for more than 2 weeks.  You cannot swallow, drink, or eat.  You drool because you cannot swallow your saliva. MAKE SURE YOU:  Understand these instructions.  Will watch your condition.  Will get help right away if you are not doing well or get worse. Document Released: 06/21/2004 Document Revised: 08/06/2011 Document Reviewed: 01/12/2011 [facility] Patient Information 2015 [place], [place].  This information is not intended to replace advice given to you by your health care provider. Make sure you discuss any questions you have with your health care provider.  Emergency Department Resource Guide 1) Find a Doctor and Pay Out of Pocket Although you won't have to find out who is covered by your insurance plan, it is a good idea to ask around and get recommendations. You will then need to call the office and see if the doctor you have chosen will accept you as a new patient and what types of options they offer for patients who are self-pay. Some doctors offer discounts or will set up payment plans for their patients who do not have insurance, but you will need to ask so you aren't surprised when you get to your appointment.  2) Contact Your Local Health Department Not all health departments have doctors that can see patients for sick visits, but many do, so it is worth a call to see if yours does. If you don't know where your local health department is, you can check in your phone book. The CDC also has a tool to help you locate your state's health department, and many state websites also have listings of all of their local health departments.  3) Find a Walk-in Clinic If your illness is not likely to be very severe or complicated, you may want to try a walk in clinic. These are popping up all over the country in pharmacies, drugstores, and shopping centers. They're usually staffed by nurse practitioners or physician assistants that have been trained to treat common illnesses and complaints. They're usually fairly quick and inexpensive. However, if you have serious medical issues or chronic medical problems, these are probably not your best option.  No Primary Care Doctor: - Call Health Connect at  [phone] - they can help you locate a primary care doctor that  accepts your insurance, provides certain services, etc. - Physician Referral Service- [phone]  Chronic Pain  Problems: Organization         Address  Phone   Notes  [facility]  [phone] Patients need to be referred by their primary care doctor.   Medication Assistance: Organization         Address  Phone   Notes  [facility] Medication [facility] [address] [place] [place], [place] [address] [phone] --Must be a resident of [facility] -- Must have NO insurance coverage whatsoever (no Medicaid/ Medicare, etc.) -- The pt. MUST have a primary care doctor that directs their care regularly and follows them in the community   [facility]  [phone]   [employer]  [phone]    Agencies that provide inexpensive medical care: Organization         Address  Phone   Notes  [facility]  [phone]   [facility]    [phone]   [facility] [address] [place], [place] [address] [phone]   [facility] [address] [place] [place] [phone]   Planned Parenthood    [phone]   [facility]    [phone]   [facility]  [phone]  [address], [place] Phone:  [phone], Fax:  [phone] Hours of Operation:  9 am - 6 pm, M-F.  Also accepts Medicaid/Medicare and self-pay.  [facility]  [address], [place] Phone: [phone], Fax: [phone]. Hours of Operation:  8:30 am - 5:30 pm, M-F.  Also accepts Medicaid and self-pay.  [facility] [place] [address] [place] Phone: [phone]   [facility] [address] Natasha Bence [place], [place] [phone], Ext. [phone] Mondays & Thursdays: 7-9 AM.  First 15 patients are seen on a first come, first serve basis.    Medicaid-accepting [facility] Providers:  Organization         Address  Phone   Notes  [facility] [address], [place] [phone] Also  accepts self-pay patients.  [facility] [address] Laurell Josephs [place], [place]  [phone]   [facility] [address], [place] [phone]   [facility] [address], [place] [phone]   Renaye Rakers [address], [place]   [phone] Only accepts [place] Access [place] patients after they have their name applied to their card.   Self-Pay (no insurance) in [facility]:  Organization         Address  Phone   Notes  Sickle Cell Patients, [facility] Internal Medicine [address] [place], [place] [phone]   [facility] [address] [place], [place] [phone]   [facility] [place]  [address], [place] [phone]   [facility]/Dr. Osei-Bonsu  [address], [place] or [address], [place] [phone] Phone number for both [place] and [place] locations is the same.  [facility] [address] [place] [phone]   [facility] [address], [place] or [address] [phone]   [facility] [address], [place] [phone], phone; [phone], fax Sees patients 1st and 3rd Saturday of every month.  Must not qualify for public or private insurance (i.e. Medicaid, Medicare, [place] Health Choice, Veterans' Benefits)  Household income should be no more than 200% of the poverty level The clinic cannot treat you if you are pregnant or think you are pregnant  Sexually transmitted diseases are not treated at the clinic.    Dental Care: Organization         Address  Phone  Notes  [facility] of [facility] [address] [place], [place] [phone] Accepts children up to age 19 who are enrolled in [place] or [place] Health Choice; pregnant  women with a Medicaid card; and children who have applied for Medicaid or [place] Health Choice, but were declined, whose parents can pay a reduced fee at time of service.  [facility] Department of [facility]  [address], [place] [phone] Accepts children up to age 70 who are enrolled in [place] or [place] Health Choice; pregnant women with a Medicaid card; and children who have applied for Medicaid or [place] Health Choice, but were declined, whose parents can pay a reduced fee at time of service.  [facility] Adult Dental Access PROGRAM  [address] [place], [place] [phone] Patients  are seen by appointment only. Walk-ins are not accepted. [facility] will see patients 41 years of age and older. Monday - Tuesday (8am-5pm) Most Wednesdays (8:30-5pm) $30 per visit, cash only  [facility] Adult Dental Access PROGRAM  [address], [facility] [phone] Patients are seen by appointment only. Walk-ins are not accepted. [facility] will see patients 56 years of age and older. One Wednesday Evening (Monthly: Volunteer Based).  $30 per visit, cash only  [employer]  [phone] for adults; Children under age 57, call Graduate Pediatric Dentistry at [phone]. Children aged 72-14, please call [phone] to request a pediatric application.  Dental services are provided in all areas of dental care including fillings, crowns and bridges, complete and partial dentures, implants, gum treatment, root canals, and extractions. Preventive care is also provided. Treatment is provided to both adults and children. Patients are selected via a lottery and there is often a waiting list.   [facility] [address], [place]  [phone] [email or web address]   [facility] [address] [place], [place] [phone], Ext. [phone] Second and Fourth Thursday of each month, opens at 6:30 AM; Clinic ends at 9 AM.  Patients are  seen on a first-come first-served basis, and a limited number are seen during each clinic.   [facility]  [address] Ether Griffins [place], [place] [phone]   Eligibility Requirements You must have lived in [place], [place], or [place] counties for at least the last three months.   You cannot be eligible for state or federal sponsored [place], including [employer], [place], or [employer].   You generally cannot be eligible for healthcare insurance through your employer.    How to apply: Eligibility screenings are held every Tuesday and Wednesday afternoon from 1:00 pm until 4:00 pm. You do not need an appointment for the interview!  [facility] [address] [place], [place] [phone]   [facility] Health Department  [phone]   [facility]  [phone]   [facility] Health Department  [phone]    Behavioral Health Resources in the Community: Intensive Outpatient Programs Organization         Address  Phone  Notes  [facility] [address] [place], [place] [phone]   [facility] Outpatient [address] [place], [place] [phone]   [facility] [address] [place], [place]  [phone]   [facility] [address]  [place], [place] [phone] or [phone]   Substance Abuse Resources Organization         Address  Phone  Notes  [facility] Drug Services  [phone]   [facility]  [phone]   [facility] [place]  [phone]   Floydene Flock  [phone]   Residential & Outpatient Substance Abuse Program  [phone]   Psychological Services Organization         Address  Phone  Notes  [facility]  [phone]   [facility] Services  [phone]   [facility] [address], [place] [phone] or [phone]    Mobile Crisis  Teams Organization         Address  Phone  Notes  [facility], Mobile Crisis Care Unit  [phone]   [facility]  [address] [place], [place] [phone]   [facility] [address], [place] [address]  [place] [phone]    Self-Help/Support Groups Organization         Address  Phone             Notes  [facility] [place] - variety of support groups  336- I7437963 Call for more information  Narcotics Anonymous (NA), [facility] [address], [employer] [place]  2 meetings at this location   Statistician         Address  Phone  Notes  [facility] [address],    [place]  [phone]   [facility]  [address] [place] [address], [place], [place] [phone]   [facility] [address] [place], [place] [phone] Admissions: 8am-3pm M-F  [facility] [address],    [place], [place] [phone]   [facility] [address] [place], [place], [place] [phone]   [facility] [address],  [place], [place] [phone]   [facility] - Intensive Outpatient [address] Laurell Josephs [address], [place], [place] [phone]   [facility] ([facility]iction [facility] [address] [place]  [place], [place] [phone] or [phone]   [facility] ([facility]) [address], [place], [place] [phone] Accepts Medicaid  [facility] [address]  [place] [phone] Substance Abuse/Addiction Treatment   [facility] Organization         Address  Phone  Notes  [facility]  [phone]   Angie Fava, PhD [address] Ervin Knack [place], [place]   [phone] or [phone]   [facility] Behavioral   [address] [place], [place] [phone]   [facility] [address] [place], [place] [phone]  Insurance/Medicaid/sponsorship through [facility] [address]                                    [place], [place] [phone] Therapy/tele-psych/case  [facility] [address] [place], [place] [phone]    Dr. Lolly Mustache  [phone]   [facility] [place]  [employer] [facility] Dept. 1) [address], [place] 2) [address] [place] 3)  [address], [place] [phone]  [phone]   [facility] Child Abuse Hotline 906-788-1420 or [phone] (After Hours)

## 2015-02-03 NOTE — ED Notes (Signed)
Pt states that he has been experiencing abd pain/indegestion after eating x 3 months. States that he takes pepto, milk of mag and gingerale for some relief.

## 2015-02-03 NOTE — ED Notes (Signed)
NAD. Pt verbalized understanding of discharge instructions. 

## 2015-04-08 ENCOUNTER — Encounter (HOSPITAL_COMMUNITY): Payer: Self-pay | Admitting: *Deleted

## 2015-04-08 ENCOUNTER — Emergency Department (HOSPITAL_COMMUNITY)
Admission: EM | Admit: 2015-04-08 | Discharge: 2015-04-08 | Disposition: A | Discharge status: Home / Self Care | Payer: No Typology Code available for payment source | Attending: Emergency Medicine | Admitting: Emergency Medicine

## 2015-04-08 DIAGNOSIS — R0981 Nasal congestion: Secondary | ICD-10-CM

## 2015-04-08 DIAGNOSIS — Z791 Long term (current) use of non-steroidal anti-inflammatories (NSAID): Secondary | ICD-10-CM | POA: Insufficient documentation

## 2015-04-08 MED ORDER — FLUTICASONE PROPIONATE 50 MCG/ACT NA SUSP: 2.0000 | Freq: Every day | NASAL | Status: DC

## 2015-04-08 MED ORDER — CETIRIZINE HCL 10 MG PO TABS: 10.0000 mg | ORAL_TABLET | Freq: Every day | ORAL | Status: DC

## 2015-04-08 NOTE — Discharge Instructions (Signed)
As we discussed, I do not think you need antibiotics at this time. You likely have a virus that is causing the nasal and sinus congestion. You may use the flonase nasal spray and Zyrtec (antihistamine) as needed for nasal congestion and sinus drainage. You may take ibuprofen (advil) or acetaminophen (tylenol) as well for your headache. Below is a list of resources for you to help establish primary care. Return to the ER for any new or worsening symptoms.  Take medications as prescribed. Return to the emergency room for worsening condition or new concerning symptoms. Follow up with your regular doctor. If you don't have a regular doctor use one of the numbers below to establish a primary care doctor.   Emergency Department Resource Guide 1) Find a Doctor and Pay Out of Pocket Although you won't have to find out who is covered by your insurance plan, it is a good idea to ask around and get recommendations. You will then need to call the office and see if the doctor you have chosen will accept you as a new patient and what types of options they offer for patients who are self-pay. Some doctors offer discounts or will set up payment plans for their patients who do not have insurance, but you will need to ask so you aren't surprised when you get to your appointment.  2) Contact Your Local Health Department Not all health departments have doctors that can see patients for sick visits, but many do, so it is worth a call to see if yours does. If you don't know where your local health department is, you can check in your phone book. The CDC also has a tool to help you locate your state's health department, and many state websites also have listings of all of their local health departments.  3) Find a Walk-in Clinic If your illness is not likely to be very severe or complicated, you may want to try a walk in clinic. These are popping up all over the country in pharmacies, drugstores, and shopping centers. They're  usually staffed by nurse practitioners or physician assistants that have been trained to treat common illnesses and complaints. They're usually fairly quick and inexpensive. However, if you have serious medical issues or chronic medical problems, these are probably not your best option.  No Primary Care Doctor: - Call Health Connect at  [phone] - they can help you locate a primary care doctor that  accepts your insurance, provides certain services, etc. - Physician Referral [phone]  Emergency Department Resource Guide 1) Find a Doctor and Pay Out of Pocket Although you won't have to find out who is covered by your insurance plan, it is a good idea to ask around and get recommendations. You will then need to call the office and see if the doctor you have chosen will accept you as a new patient and what types of options they offer for patients who are self-pay. Some doctors offer discounts or will set up payment plans for their patients who do not have insurance, but you will need to ask so you aren't surprised when you get to your appointment.  2) Contact Your Local Health Department Not all health departments have doctors that can see patients for sick visits, but many do, so it is worth a call to see if yours does. If you don't know where your local health department is, you can check in your phone book. The CDC also has a tool to help you locate  your state's health department, and many state websites also have listings of all of their local health departments.  3) Find a Walk-in Clinic If your illness is not likely to be very severe or complicated, you may want to try a walk in clinic. These are popping up all over the country in pharmacies, drugstores, and shopping centers. They're usually staffed by nurse practitioners or physician assistants that have been trained to treat common illnesses and complaints. They're usually fairly quick and inexpensive. However, if you have serious  medical issues or chronic medical problems, these are probably not your best option.  No Primary Care Doctor: - Call Health Connect at  [phone] - they can help you locate a primary care doctor that  accepts your insurance, provides certain services, etc. - Physician Referral Service- [phone]  Chronic Pain Problems: Organization         Address  Phone   Notes  [facility]  [phone] Patients need to be referred by their primary care doctor.   Medication Assistance: Organization         Address  Phone   Notes  [facility] [address] [place], [place] [address] [phone] --Must be a resident of [facility] [place] -- Must have NO insurance coverage whatsoever (no Medicaid/ Medicare, etc.) -- The pt. MUST have a primary care doctor that directs their care regularly and follows them in the community   Med[facility]ist  [phone]   [employer]  [phone]    Agencies that provide inexpensive medical care: Organization         Address  Phone   Notes  [facility]  [phone]   [facility]    [phone]   [facility] [address] [place], [place] [address] [phone]   [facility] [place] [address], [place] [phone]   [facility]    [phone]   [facility]    [phone]   [facility]  [address], [place] Phone:  [phone], Fax:  [phone] Hours of Operation:  9 am - 6 pm, M-F.  Also accepts Medicaid/Medicare and self-pay.  [facility]  [address], [place] Phone: [phone], Fax: [phone]. Hours of Operation:  8:30 am - 5:30 pm, M-F.  Also accepts Medicaid and self-pay.  [facility] [place] [address], [place] Phone: [phone]   [facility] [address], [place], [place] [phone], Ext. [phone] Mondays & Thursdays: 7-9 AM.  First 15 patients are seen on a first come, first serve basis.    Medicaid-accepting [facility] Providers:  Organization         Address  Phone   Notes  [facility] [address], [place] [phone] Also accepts self-pay patients.  [facility] [address], [place]  [phone]   [facility] [address], [place] [phone]   [facility] [address] [place] [address], [place] [phone]   Renaye RakersVeita Bland [address], [place]   [phone] Only accepts [place] patients after they have their name applied to their card.   Self-Pay (no insurance) in [facility]:  Organization  Address  Phone   Notes  Sickle Cell Patients, [facility] [address] [place], [place] [phone]   [facility] [address] [place], [place] [phone]   [facility]  [address], [place] [phone]   [facility]/Dr. Osei-Bonsu  [address] [place] or [address], [place] [phone] Phone number for both [place] and [place] locations is the same.  [facility] [address] [place] [phone]   [facility] [address], [place] or [address] [phone]   [facility] [address], [place] [phone], phone; [phone], fax Sees patients 1st and 3rd Saturday of every month.  Must not qualify for public or private insurance (i.e. Medicaid, Medicare,  Health Choice, Veterans' Benefits)  Household income should be no more than 200% of the poverty level The clinic cannot treat you if you are pregnant or think you are pregnant  Sexually  transmitted diseases are not treated at the clinic.   Please obtain all of your results from medical records or have your doctors office obtain the results - share them with your doctor - you should be seen at your doctors office in the next 2 days. Call today to arrange your follow up. Take the medications as prescribed. Please review all of the medicines and only take them if you do not have an allergy to them. Please be aware that if you are taking birth control pills, taking other prescriptions, ESPECIALLY ANTIBIOTICS may make the birth control ineffective - if this is the case, either do not engage in sexual activity or use alternative methods of birth control such as condoms until you have finished the medicine and your family doctor says it is OK to restart them. If you are on a blood thinner such as COUMADIN, be aware that any other medicine that you take may cause the coumadin to either work too much, or not enough - you should have your coumadin level rechecked in next 7 days if this is the case.  ?  It is also a possibility that you have an allergic reaction to any of the medicines that you have been prescribed - Everybody reacts differently to medications and while MOST people have no trouble with most medicines, you may have a reaction such as nausea, vomiting, rash, swelling, shortness of breath. If this is the case, please stop taking the medicine immediately and contact your physician.  ?  You should return to the ER if you develop severe or worsening symptoms.

## 2015-04-08 NOTE — ED Notes (Signed)
Pt c/o a cold with nasal congestion and nose running since yesterday

## 2015-04-08 NOTE — ED Provider Notes (Signed)
CSN: [id number]     Arrival date & time 04/08/15  1537 History   First MD Initiated Contact with Patient 04/08/15 1601     Chief Complaint  Patient presents with  . Nasal Congestion    HPI   Jordan [name] is an 39 y.o. male with no significant PMH who presents to the ED for evaluation of nasal congestion. He states that his symptoms began last night. States that he feels like his nose is really stuffy and feels like his sinuses are draining into his throat. Endorses a mild diffuse headache that feels like increased pressure. He states he has not tried anything for his symptoms. Denies history of seasonal allergies. Denies fever, chills, chest pain, SOB, abd pain, N/V/D. Denies alcohol, tobacco, or other drug use. He is requesting a work note today.  History reviewed. No pertinent past medical history. History reviewed. No pertinent past surgical history. No family history on file. Social History  Substance Use Topics  . Smoking status: Never [name]   . Smokeless tobacco: None  . Alcohol Use: Yes     Comment: occ    Review of Systems  All other systems reviewed and are negative.     Allergies  Review of patient's allergies indicates no known allergies.  Home Medications   Prior to Admission medications   Medication Sig Start Date End Date Taking? Authorizing Provider  cetirizine (ZYRTEC) 10 MG tablet Take 1 tablet (10 mg total) by mouth daily. 04/08/15   Ace Gins Tyrice Hewitt, PA-C  fluticasone (FLONASE) 50 MCG/ACT nasal spray Place 2 sprays into both nostrils daily. 04/08/15   Ace Gins Taysom Glymph, PA-C  ibuprofen (ADVIL,MOTRIN) 200 MG tablet Take 400-800 mg by mouth every 6 (six) hours as needed for mild pain.    Historical Provider, MD  loperamide (IMODIUM) 2 MG capsule Take 1 capsule (2 mg total) by mouth 4 (four) times daily as needed for diarrhea or loose stools. Patient not taking: Reported on 02/03/2015 12/22/14   Linwood Dibbles, MD  naproxen sodium (ANAPROX) 220 MG tablet Take 220 mg by mouth 2  (two) times daily as needed (pain).    Historical Provider, MD  promethazine (PHENERGAN) 25 MG tablet Take 1 tablet (25 mg total) by mouth every 6 (six) hours as needed for nausea or vomiting. Patient not taking: Reported on 02/03/2015 12/22/14   Linwood Dibbles, MD   BP 134/89 mmHg  Pulse 66  Temp(Src) 98 F (36.7 C)  Resp 16  Ht  (1.753 m)  Wt 180 lb (81.647 kg)  BMI 26.57 kg/m2  SpO2 98% Physical Exam  Constitutional: He is oriented to person, place, and time. He appears well-developed and well-nourished. No distress.  HENT:  Head: Atraumatic.  Right Ear: External ear normal.  Left Ear: External ear normal.  Nose: No sinus tenderness. Right sinus exhibits no maxillary sinus tenderness and no frontal sinus tenderness. Left sinus exhibits no maxillary sinus tenderness and no frontal sinus tenderness.  Mouth/Throat: Oropharynx is clear and moist. No oropharyngeal exudate.  Mucosal edema with swollen turbinates bilaterally  Eyes: No scleral icterus.  Neck: No tracheal deviation present.  Pulmonary/Chest: No respiratory distress.  Abdominal: He exhibits no distension.  Neurological: He is alert and oriented to person, place, and time.  Skin: He is not diaphoretic.  Psychiatric: He has a normal mood and affect. His behavior is normal.  Nursing note and vitals reviewed.   ED Course  Procedures (including critical care time) Labs Review Labs Reviewed - No data  to display  Imaging Review No results found. I have personally reviewed and evaluated these images and lab results as part of my medical decision-making.   EKG Interpretation None      MDM   Final diagnoses:  Nasal sinus congestion    Given recent onset of symptoms, antibiotic therapy not indicated at this time. I discussed with pt that he likely has sinus congestion secondary to a virus or seasonal changes. Will give rx for flonase. Pt also endorses sinus drainage so will give rx for Zyrtec. Pt does not have a PCP so  gave resource guide. Return precautions given.     Carlene CoriaSerena Y Analilia Geddis, PA-C 04/08/15 1701  Leta BaptistEmily Roe Nguyen, MD 04/10/15 [phone]

## 2015-07-25 ENCOUNTER — Encounter (HOSPITAL_COMMUNITY): Payer: Self-pay | Admitting: *Deleted

## 2015-07-25 ENCOUNTER — Emergency Department (HOSPITAL_COMMUNITY)
Admission: EM | Admit: 2015-07-25 | Discharge: 2015-07-25 | Disposition: A | Discharge status: Home / Self Care | Payer: No Typology Code available for payment source | Attending: Emergency Medicine | Admitting: Emergency Medicine

## 2015-07-25 DIAGNOSIS — R05 Cough: Secondary | ICD-10-CM | POA: Insufficient documentation

## 2015-07-25 DIAGNOSIS — Z79899 Other long term (current) drug therapy: Secondary | ICD-10-CM | POA: Insufficient documentation

## 2015-07-25 DIAGNOSIS — R21 Rash and other nonspecific skin eruption: Secondary | ICD-10-CM | POA: Insufficient documentation

## 2015-07-25 DIAGNOSIS — R059 Cough, unspecified: Secondary | ICD-10-CM

## 2015-07-25 DIAGNOSIS — Z7951 Long term (current) use of inhaled steroids: Secondary | ICD-10-CM | POA: Insufficient documentation

## 2015-07-25 MED ORDER — BENZONATATE 100 MG PO CAPS: 100.0000 mg | ORAL_CAPSULE | Freq: Three times a day (TID) | ORAL | Status: DC

## 2015-07-25 MED ORDER — DIPHENHYDRAMINE HCL 25 MG PO TABS: 25.0000 mg | ORAL_TABLET | Freq: Four times a day (QID) | ORAL | Status: DC | PRN

## 2015-07-25 NOTE — Discharge Instructions (Signed)
Take the prescribed medication as directed. °Follow-up with your primary care physician. °Return to the ED for new or worsening symptoms. ° °

## 2015-07-25 NOTE — ED Notes (Signed)
Pt reports rash to upper body for one week and a dry cough for one week.

## 2015-07-25 NOTE — ED Provider Notes (Signed)
CSN: [id number]     Arrival date & time 07/25/15  0818 History   First MD Initiated Contact with Patient 07/25/15 0831     Chief Complaint  Patient presents with  . Rash  . Cough     (Consider location/radiation/quality/duration/timing/severity/associated sxs/prior Treatment) Patient is a 40 y.o. male presenting with rash and cough. The history is provided by the patient and medical records.  Rash Cough Associated symptoms: rash    40 year old male with no significant past medical history presenting to the ED for cough and rash. Patient states he has had a dry cough for the past week. He states is worse in the morning and late at night but improves throughout the day. He denies any fever or chills. No sick contacts similar symptoms. No chest pain or shortness of breath. He has used over-the-counter cough and cold medications without relief.  Patient also complains of a rash of his back. He states is not pruritic but he has felt "bumps" on his back. He did recently change his shower gel, unsure if it is attributed to this.  History reviewed. No pertinent past medical history. History reviewed. No pertinent past surgical history. History reviewed. No pertinent family history. Social History  Substance Use Topics  . Smoking status: Never Smoker   . Smokeless tobacco: None  . Alcohol Use: Yes     Comment: occ    Review of Systems  Respiratory: Positive for cough.   Skin: Positive for rash.  All other systems reviewed and are negative.     Allergies  Review of patient's allergies indicates no known allergies.  Home Medications   Prior to Admission medications   Medication Sig Start Date End Date Taking? Authorizing Provider  cetirizine (ZYRTEC) 10 MG tablet Take 1 tablet (10 mg total) by mouth daily. 04/08/15  Yes Ace Gins Sam, PA-C  fluticasone (FLONASE) 50 MCG/ACT nasal spray Place 2 sprays into both nostrils daily. 04/08/15  Yes Ace Gins Sam, PA-C  ibuprofen  (ADVIL,MOTRIN) 200 MG tablet Take 400-800 mg by mouth every 6 (six) hours as needed for mild pain.   Yes Historical Provider, MD  loperamide (IMODIUM) 2 MG capsule Take 1 capsule (2 mg total) by mouth 4 (four) times daily as needed for diarrhea or loose stools. 12/22/14  Yes Linwood Dibbles, MD  naproxen sodium (ANAPROX) 220 MG tablet Take 220 mg by mouth 2 (two) times daily as needed (pain).   Yes Historical Provider, MD  promethazine (PHENERGAN) 25 MG tablet Take 1 tablet (25 mg total) by mouth every 6 (six) hours as needed for nausea or vomiting. 12/22/14  Yes Linwood Dibbles, MD   BP 138/91 mmHg  Pulse 73  Temp(Src) 98.7 F (37.1 C) (Oral)  Resp 16  Ht  (1.753 m)  Wt 80.74 kg  BMI 26.27 kg/m2  SpO2 96%   Physical Exam  Constitutional: He is oriented to person, place, and time. He appears well-developed and well-nourished. No distress.  HENT:  Head: Normocephalic and atraumatic.  Right Ear: Hearing, tympanic membrane and ear canal normal.  Left Ear: Hearing, tympanic membrane and ear canal normal.  Nose: Nose normal.  Mouth/Throat: Uvula is midline, oropharynx is clear and moist and mucous membranes are normal. No oropharyngeal exudate, posterior oropharyngeal edema, posterior oropharyngeal erythema or tonsillar abscesses.  Eyes: Conjunctivae and EOM are normal. Pupils are equal, round, and reactive to light.  Neck: Normal range of motion. Neck supple.  Cardiovascular: Normal rate, regular rhythm and normal heart sounds.  Pulmonary/Chest: Effort normal and breath sounds normal. No respiratory distress. He has no wheezes. He has no rhonchi. He has no rales.  Lungs clear, intermittent dry cough  Abdominal: Soft. Bowel sounds are normal. There is no tenderness. There is no guarding.  Musculoskeletal: Normal range of motion. He exhibits no edema.  Neurological: He is alert and oriented to person, place, and time.  Skin: Skin is warm and dry. No rash noted. He is not diaphoretic.  No rash noted   Psychiatric: He has a normal mood and affect.  Nursing note and vitals reviewed.   ED Course  Procedures (including critical care time) Labs Review Labs Reviewed - No data to display  Imaging Review No results found. I have personally reviewed and evaluated these images and lab results as part of my medical decision-making.   EKG Interpretation None      MDM   Final diagnoses:  Cough   40 y.o. M here with cough and rash.  Patient afebrile, non-toxic.  Exam is benign-- no wheezes or rhonchi on exam. No complaints of chest pain or SOB.   I do not appreciate any rash on exam.  Patient did recently change shower gel-- possible skin irritation from this.  Rx tessalon and benadryl.  Discussed plan with patient, he/she acknowledged understanding and agreed with plan of care.  Return precautions given for new or worsening symptoms.  Garlon Hatchet, PA-C 07/25/15 6962  Vanetta Mulders, MD 07/25/15 [phone]

## 2015-07-25 NOTE — ED Notes (Signed)
Declined W/C at D/C and was escorted to lobby by RN. 

## 2015-08-11 ENCOUNTER — Emergency Department (HOSPITAL_COMMUNITY)
Admission: EM | Admit: 2015-08-11 | Discharge: 2015-08-11 | Disposition: A | Discharge status: Home / Self Care | Payer: No Typology Code available for payment source | Attending: Emergency Medicine | Admitting: Emergency Medicine

## 2015-08-11 ENCOUNTER — Encounter (HOSPITAL_COMMUNITY): Payer: Self-pay | Admitting: Emergency Medicine

## 2015-08-11 DIAGNOSIS — Z79899 Other long term (current) drug therapy: Secondary | ICD-10-CM | POA: Insufficient documentation

## 2015-08-11 DIAGNOSIS — J40 Bronchitis, not specified as acute or chronic: Secondary | ICD-10-CM

## 2015-08-11 DIAGNOSIS — Z7951 Long term (current) use of inhaled steroids: Secondary | ICD-10-CM | POA: Insufficient documentation

## 2015-08-11 MED ORDER — BENZONATATE 100 MG PO CAPS: 100.0000 mg | ORAL_CAPSULE | Freq: Three times a day (TID) | ORAL | Status: DC

## 2015-08-11 NOTE — ED Notes (Signed)
Patient states cough x 2 weeks.  Patient states was seen recently and received tessalon perles and wants more.  Patient states chest congestion with dry cough.   Patient states nasal congestion, but not able to blow anything out.   Denies other symptoms.   Patient states he has been taking benadryl at home.

## 2015-08-11 NOTE — ED Provider Notes (Signed)
CSN: [id number]     Arrival date & time 08/11/15  0759 History   First MD Initiated Contact with Patient 08/11/15 [phone]     Chief Complaint  Patient presents with  . Cough   HPI  Jordan [name] is a 40 year old male presenting with cough. Onset of cough was approximately 2 weeks ago. He states that he is having a dry cough that is unrelieved with over-the-counter medications. He has tried cold and flu, Mucinex, Robitussin and Delsym. He was evaluated in this emergency department 2 weeks ago for the same complaint and discharged home with [facility]. He states that this has helped his cough that he recently ran out of his prescription. He states that in the interim he has been coughing so forcefully that his chest is aching. He denies chest pain between coughing episodes. Denies posttussive emesis. He has no other complaints at this time. Denies fevers, chills, headache, dizziness, syncope, nasal congestion, rhinorrhea, sore throat, neck pain, SOB, wheezing, abdominal pain, nausea or vomiting. Denies known sick contacts. Patient does not smoke cigarettes.  History reviewed. No pertinent past medical history. History reviewed. No pertinent past surgical history. No family history on file. Social History  Substance Use Topics  . Smoking status: Never Smoker   . Smokeless tobacco: None  . Alcohol Use: Yes     Comment: occ    Review of Systems  Respiratory: Positive for cough.   All other systems reviewed and are negative.     Allergies  Review of patient's allergies indicates no known allergies.  Home Medications   Prior to Admission medications   Medication Sig Start Date End Date Taking? Authorizing Provider  benzonatate (TESSALON) 100 MG capsule Take 1 capsule (100 mg total) by mouth every 8 (eight) hours. 07/25/15  Yes Garlon Hatchet, PA-C  diphenhydrAMINE (BENADRYL) 25 MG tablet Take 1 tablet (25 mg total) by mouth every 6 (six) hours as needed for itching (Rash). 07/25/15  Yes Garlon Hatchet, PA-C  fluticasone (FLONASE) 50 MCG/ACT nasal spray Place 2 sprays into both nostrils daily. 04/08/15  Yes Ace Gins Sam, PA-C  benzonatate (TESSALON) 100 MG capsule Take 1 capsule (100 mg total) by mouth every 8 (eight) hours. 08/11/15   Jordan Witzke, PA-C  cetirizine (ZYRTEC) 10 MG tablet Take 1 tablet (10 mg total) by mouth daily. 04/08/15   Ace Gins Sam, PA-C  ibuprofen (ADVIL,MOTRIN) 200 MG tablet Take 400-800 mg by mouth every 6 (six) hours as needed for mild pain.    Historical Provider, MD  loperamide (IMODIUM) 2 MG capsule Take 1 capsule (2 mg total) by mouth 4 (four) times daily as needed for diarrhea or loose stools. 12/22/14   Linwood Dibbles, MD  naproxen sodium (ANAPROX) 220 MG tablet Take 220 mg by mouth 2 (two) times daily as needed (pain).    Historical Provider, MD  promethazine (PHENERGAN) 25 MG tablet Take 1 tablet (25 mg total) by mouth every 6 (six) hours as needed for nausea or vomiting. 12/22/14   Linwood Dibbles, MD   BP 131/85 mmHg  Pulse 81  Temp(Src) 98.6 F (37 C) (Oral)  Resp 18  SpO2 99% Physical Exam  Constitutional: He appears well-developed and well-nourished. No distress.  Nontoxic-appearing  HENT:  Head: Normocephalic and atraumatic.  Mouth/Throat: Oropharynx is clear and moist. No oropharyngeal exudate.  Eyes: Conjunctivae are normal. Right eye exhibits no discharge. Left eye exhibits no discharge. No scleral icterus.  Neck: Normal range of motion. Neck supple.  Cardiovascular: Normal rate, regular rhythm and normal heart sounds.   Pulmonary/Chest: Effort normal and breath sounds normal. No respiratory distress. He has no wheezes. He has no rales. He exhibits no tenderness.  Lungs CTAB. No coughing during exam  Musculoskeletal: Normal range of motion.  Lymphadenopathy:    He has no cervical adenopathy.  Neurological: He is alert. Coordination normal.  Skin: Skin is warm and dry.  Psychiatric: He has a normal mood and affect. His behavior is normal.   Nursing note and vitals reviewed.   ED Course  Procedures (including critical care time) Labs Review Labs Reviewed - No data to display  Imaging Review No results found. I have personally reviewed and evaluated these images and lab results as part of my medical decision-making.   EKG Interpretation None      MDM   Final diagnoses:  Bronchitis   40 year old male presenting with dry cough 2 weeks. Previously evaluated in emergency department and discharged with Tessalon pearls which provided significant relief. Afebrile and nontoxic appearing. Lungs clear to auscultation bilaterally without wheezes or rhonchi. No chest tenderness to palpation. Presentation consistent with bronchitis; likely viral. I do not feel that chest imaging is necessary at this time. Will write another prescription for Tessalon but discussed that patient will need to follow-up with his primary care provider if the coughing persists. Patient does not currently have a PCP; discussed ways to get one and provided [facility] number in discharge paperwork. Return precautions given in discharge paperwork and discussed with pt at bedside. Pt stable for discharge     Jordan HeimlichStevi Keela Rubert, PA-C 08/11/15 [id number]  Jordan BarretteMarcy Pfeiffer, MD 08/12/15 [phone]

## 2015-08-11 NOTE — Discharge Instructions (Signed)
Upper Respiratory Infection, Adult Most upper respiratory infections (URIs) are a viral infection of the air passages leading to the lungs. A URI affects the nose, throat, and upper air passages. The most common type of URI is nasopharyngitis and is typically referred to as "the common cold." URIs run their course and usually go away on their own. Most of the time, a URI does not require medical attention, but sometimes a bacterial infection in the upper airways can follow a viral infection. This is called a secondary infection. Sinus and middle ear infections are common types of secondary upper respiratory infections. Bacterial pneumonia can also complicate a URI. A URI can worsen asthma and chronic obstructive pulmonary disease (COPD). Sometimes, these complications can require emergency medical care and may be life threatening.  CAUSES Almost all URIs are caused by viruses. A virus is a type of germ and can spread from one person to another.  RISKS FACTORS You may be at risk for a URI if:   You smoke.   You have chronic heart or lung disease.  You have a weakened defense (immune) system.   You are very young or very old.   You have nasal allergies or asthma.  You work in crowded or poorly ventilated areas.  You work in health care facilities or schools. SIGNS AND SYMPTOMS  Symptoms typically develop 2-3 days after you come in contact with a cold virus. Most viral URIs last 7-10 days. However, viral URIs from the influenza virus (flu virus) can last 14-18 days and are typically more severe. Symptoms may include:   Runny or stuffy (congested) nose.   Sneezing.   Cough.   Sore throat.   Headache.   Fatigue.   Fever.   Loss of appetite.   Pain in your forehead, behind your eyes, and over your cheekbones (sinus pain).  Muscle aches.  DIAGNOSIS  Your health care provider may diagnose a URI by:  Physical exam.  Tests to check that your symptoms are not due to  another condition such as:  Strep throat.  Sinusitis.  Pneumonia.  Asthma. TREATMENT  A URI goes away on its own with time. It cannot be cured with medicines, but medicines may be prescribed or recommended to relieve symptoms. Medicines may help:  Reduce your fever.  Reduce your cough.  Relieve nasal congestion. HOME CARE INSTRUCTIONS   Take medicines only as directed by your health care provider.   Gargle warm saltwater or take cough drops to comfort your throat as directed by your health care provider.  Use a warm mist humidifier or inhale steam from a shower to increase air moisture. This may make it easier to breathe.  Drink enough fluid to keep your urine clear or pale yellow.   Eat soups and other clear broths and maintain good nutrition.   Rest as needed.   Return to work when your temperature has returned to normal or as your health care provider advises. You may need to stay home longer to avoid infecting others. You can also use a face mask and careful hand washing to prevent spread of the virus.  Increase the usage of your inhaler if you have asthma.   Do not use any tobacco products, including cigarettes, chewing tobacco, or electronic cigarettes. If you need help quitting, ask your health care provider. PREVENTION  The best way to protect yourself from getting a cold is to practice good hygiene.   Avoid oral or hand contact with people with cold   symptoms.   Wash your hands often if contact occurs.  There is no clear evidence that vitamin C, vitamin E, echinacea, or exercise reduces the chance of developing a cold. However, it is always recommended to get plenty of rest, exercise, and practice good nutrition.  SEEK MEDICAL CARE IF:   You are getting worse rather than better.   Your symptoms are not controlled by medicine.   You have chills.  You have worsening shortness of breath.  You have brown or red mucus.  You have yellow or brown nasal  discharge.  You have pain in your face, especially when you bend forward.  You have a fever.  You have swollen neck glands.  You have pain while swallowing.  You have white areas in the back of your throat. SEEK IMMEDIATE MEDICAL CARE IF:   You have severe or persistent:  Headache.  Ear pain.  Sinus pain.  Chest pain.  You have chronic lung disease and any of the following:  Wheezing.  Prolonged cough.  Coughing up blood.  A change in your usual mucus.  You have a stiff neck.  You have changes in your:  Vision.  Hearing.  Thinking.  Mood. MAKE SURE YOU:   Understand these instructions.  Will watch your condition.  Will get help right away if you are not doing well or get worse.   This information is not intended to replace advice given to you by your health care provider. Make sure you discuss any questions you have with your health care provider.   Document Released: 11/07/2000 Document Revised: 09/28/2014 Document Reviewed: 08/19/2013 Elsevier Interactive Patient Education 2016 [employer]  

## 2016-04-03 ENCOUNTER — Emergency Department (HOSPITAL_COMMUNITY)
Admission: EM | Admit: 2016-04-03 | Discharge: 2016-04-03 | Disposition: A | Discharge status: Home / Self Care | Payer: No Typology Code available for payment source | Attending: Emergency Medicine | Admitting: Emergency Medicine

## 2016-04-03 ENCOUNTER — Encounter (HOSPITAL_COMMUNITY): Payer: Self-pay | Admitting: Emergency Medicine

## 2016-04-03 DIAGNOSIS — M62830 Muscle spasm of back: Secondary | ICD-10-CM | POA: Insufficient documentation

## 2016-04-03 MED ORDER — NAPROXEN 500 MG PO TABS: 500.0000 mg | ORAL_TABLET | Freq: Two times a day (BID) | ORAL | 0 refills | Status: DC

## 2016-04-03 MED ORDER — METHOCARBAMOL 500 MG PO TABS
1000.0000 mg | ORAL_TABLET | Freq: Once | ORAL | Status: AC
  Administered 2016-04-03: 1000 mg via ORAL
  Filled 2016-04-03: qty 2

## 2016-04-03 MED ORDER — METHOCARBAMOL 500 MG PO TABS: 500.0000 mg | ORAL_TABLET | Freq: Three times a day (TID) | ORAL | 0 refills | Status: DC | PRN

## 2016-04-03 MED ORDER — HYDROCODONE-ACETAMINOPHEN 5-325 MG PO TABS: 1.0000 | ORAL_TABLET | ORAL | 0 refills | Status: DC | PRN

## 2016-04-03 MED ORDER — IBUPROFEN 400 MG PO TABS
800.0000 mg | ORAL_TABLET | Freq: Once | ORAL | Status: AC
  Administered 2016-04-03: 800 mg via ORAL
  Filled 2016-04-03: qty 2

## 2016-04-03 NOTE — Discharge Instructions (Signed)
Pain medication as prescribed. Robaxin for muscle spasms. Ice to painful or spasming areas tonight. As areas improve you may use ice, heat, or massage. Slowly improve your activity as your symptoms improved.

## 2016-04-03 NOTE — ED Provider Notes (Signed)
[facility]-EMERGENCY DEPT Provider Note   CSN: [id number] Arrival date & time: 04/03/16  1737     History   Chief Complaint Chief Complaint  Patient presents with  . Neck Pain  . Back Pain    HPI Jordan [name] is a 40 y.o. male.He presents for valuation of left-sided back pain. He states he went to get out of bed and felt some pain in his left lateral ribs and to the left of his spine. He laid back down for a while. When he attempted to get back. Sudden pain from his neck down to his back. All left paraspinal. No right-sided symptoms. Nonradiating to arms or legs. No difficulty breathing or pleuritic discomfort. No cough or fever. No recent illness. No past similar episodes. He does work at [employer] He does not perform repetitive motion he states he has a different assignment per day. No recent falls injuries trauma or obvious inciting events  HPI  History reviewed. No pertinent past medical history.  There are no active problems to display for this patient.   History reviewed. No pertinent surgical history.     Home Medications    Prior to Admission medications   Medication Sig Start Date End Date Taking? Authorizing Provider  benzonatate (TESSALON) 100 MG capsule Take 1 capsule (100 mg total) by mouth every 8 (eight) hours. 07/25/15   Garlon Hatchet, PA-C  benzonatate (TESSALON) 100 MG capsule Take 1 capsule (100 mg total) by mouth every 8 (eight) hours. 08/11/15   Stevi Barrett, PA-C  cetirizine (ZYRTEC) 10 MG tablet Take 1 tablet (10 mg total) by mouth daily. 04/08/15   Ace Gins Sam, PA-C  diphenhydrAMINE (BENADRYL) 25 MG tablet Take 1 tablet (25 mg total) by mouth every 6 (six) hours as needed for itching (Rash). 07/25/15   Garlon Hatchet, PA-C  fluticasone (FLONASE) 50 MCG/ACT nasal spray Place 2 sprays into both nostrils daily. 04/08/15   Carlene Coria, PA-C  HYDROcodone-acetaminophen (NORCO/VICODIN) 5-325 MG tablet Take 1 tablet by mouth every 4 (four) hours as needed. 04/03/16    Rolland Porter, MD  ibuprofen (ADVIL,MOTRIN) 200 MG tablet Take 400-800 mg by mouth every 6 (six) hours as needed for mild pain.    Historical Provider, MD  loperamide (IMODIUM) 2 MG capsule Take 1 capsule (2 mg total) by mouth 4 (four) times daily as needed for diarrhea or loose stools. 12/22/14   Linwood Dibbles, MD  methocarbamol (ROBAXIN) 500 MG tablet Take 1 tablet (500 mg total) by mouth 3 (three) times daily between meals as needed. 04/03/16   Rolland Porter, MD  naproxen (NAPROSYN) 500 MG tablet Take 1 tablet (500 mg total) by mouth 2 (two) times daily. 04/03/16   Rolland Porter, MD  naproxen sodium (ANAPROX) 220 MG tablet Take 220 mg by mouth 2 (two) times daily as needed (pain).    Historical Provider, MD  promethazine (PHENERGAN) 25 MG tablet Take 1 tablet (25 mg total) by mouth every 6 (six) hours as needed for nausea or vomiting. 12/22/14   Linwood Dibbles, MD    Family History History reviewed. No pertinent family history.  Social History Social History  Substance Use Topics  . Smoking status: Never Smoker  . Smokeless tobacco: Never Used  . Alcohol use Yes     Comment: occ     Allergies   Patient has no known allergies.   Review of Systems Review of Systems  Constitutional: Negative for appetite change, chills, diaphoresis, fatigue and fever.  HENT: Negative  for mouth sores, sore throat and trouble swallowing.   Eyes: Negative for visual disturbance.  Respiratory: Negative for cough, chest tightness, shortness of breath and wheezing.   Cardiovascular: Negative for chest pain.  Gastrointestinal: Negative for abdominal distention, abdominal pain, diarrhea, nausea and vomiting.  Endocrine: Negative for polydipsia, polyphagia and polyuria.  Genitourinary: Negative for dysuria, frequency and hematuria.  Musculoskeletal: Positive for back pain. Negative for gait problem.  Skin: Negative for color change, pallor and rash.  Neurological: Negative for dizziness, syncope, light-headedness and  headaches.  Hematological: Does not bruise/bleed easily.  Psychiatric/Behavioral: Negative for behavioral problems and confusion.     Physical Exam Updated Vital Signs BP 154/99 (BP Location: Right Arm)   Pulse 76   Temp 97.9 F (36.6 C) (Oral)   Resp 18   SpO2 100%   Physical Exam  Constitutional: He is oriented to person, place, and time. He appears well-developed and well-nourished. No distress.  HENT:  Head: Normocephalic.  Eyes: Conjunctivae are normal. Pupils are equal, round, and reactive to light. No scleral icterus.  Neck: Normal range of motion. Neck supple. No thyromegaly present.  Cardiovascular: Normal rate and regular rhythm.  Exam reveals no gallop and no friction rub.   No murmur heard. Pulmonary/Chest: Effort normal and breath sounds normal. No respiratory distress. He has no wheezes. He has no rales.  Abdominal: Soft. Bowel sounds are normal. He exhibits no distension. There is no tenderness. There is no rebound.  Musculoskeletal: Normal range of motion.       Back:  Neurological: He is alert and oriented to person, place, and time.  Normal symmetric Strength to shoulder shrug, triceps, biceps, grip,wrist flex/extend,and intrinsics  Norma lsymmetric sensation above and below clavicles, and to all distributions to UEs. Norma symmetric strength to flex/.extend hip and knees, dorsi/plantar flex ankles. Normal symmetric sensation to all distributions to LEs Patellar and achilles reflexes 1-2+. Downgoing Babinski   Skin: Skin is warm and dry. No rash noted.  Psychiatric: He has a normal mood and affect. His behavior is normal.     ED Treatments / Results  Labs (all labs ordered are listed, but only abnormal results are displayed) Labs Reviewed - No data to display  EKG  EKG Interpretation None       Radiology No results found.  Procedures Procedures (including critical care time)  Medications Ordered in ED Medications  methocarbamol (ROBAXIN)  tablet 1,000 mg (not administered)  ibuprofen (ADVIL,MOTRIN) tablet 800 mg (not administered)     Initial Impression / Assessment and Plan / ED Course  I have reviewed the triage vital signs and the nursing notes.  Pertinent labs & imaging results that were available during my care of the patient were reviewed by me and considered in my medical decision making (see chart for details).  Clinical Course     Para spinal muscular spasm. No symptoms or findings to suggest radiculopathy or acute neurological abnormality. No history of trauma. No history of cancer, IV drug use, or fever. No pulmonary symptoms. Think he is appropriate for home with anti-inflammatories, pain medicine, muscle relaxants.  Final Clinical Impressions(s) / ED Diagnoses   Final diagnoses:  Muscle spasm of back  Spasm of thoracic back muscle    New Prescriptions New Prescriptions   HYDROCODONE-ACETAMINOPHEN (NORCO/VICODIN) 5-325 MG TABLET    Take 1 tablet by mouth every 4 (four) hours as needed.   METHOCARBAMOL (ROBAXIN) 500 MG TABLET    Take 1 tablet (500 mg total) by mouth 3 (  three) times daily between meals as needed.   NAPROXEN (NAPROSYN) 500 MG TABLET    Take 1 tablet (500 mg total) by mouth 2 (two) times daily.     Rolland PorterMark Deneisha Dade, MD 04/03/16 [phone]

## 2016-04-03 NOTE — ED Triage Notes (Signed)
Pt sts neck and back pain x 1 week worse today with spasms

## 2016-04-07 ENCOUNTER — Encounter (HOSPITAL_COMMUNITY): Payer: Self-pay

## 2016-04-07 ENCOUNTER — Emergency Department (HOSPITAL_COMMUNITY)
Admission: EM | Admit: 2016-04-07 | Discharge: 2016-04-07 | Disposition: A | Discharge status: Home / Self Care | Payer: No Typology Code available for payment source | Attending: Emergency Medicine | Admitting: Emergency Medicine

## 2016-04-07 DIAGNOSIS — M25512 Pain in left shoulder: Secondary | ICD-10-CM | POA: Insufficient documentation

## 2016-04-07 DIAGNOSIS — M542 Cervicalgia: Secondary | ICD-10-CM | POA: Insufficient documentation

## 2016-04-07 MED ORDER — MELOXICAM 15 MG PO TABS: 15.0000 mg | ORAL_TABLET | Freq: Every day | ORAL | 0 refills | Status: DC

## 2016-04-07 MED ORDER — MELOXICAM 15 MG PO TABS
15.0000 mg | ORAL_TABLET | Freq: Every day | ORAL | Status: DC
  Filled 2016-04-07: qty 1

## 2016-04-07 NOTE — ED Triage Notes (Signed)
Patient seen on Tuesday for left chest, shoulder and axilla pain. Taking meds for pain and muscle relaxer as prescribed with no relief. States the pain runs down to chest and axilla. NAD

## 2016-04-07 NOTE — Discharge Instructions (Signed)
Stop Naproxen. Start Mobic Try ice or heat on muscles Try and rest as much as possible between now and Monday EKG was normal today

## 2016-04-07 NOTE — ED Provider Notes (Signed)
[facility] Provider Note   CSN: [id number] Arrival date & time: 04/07/16  1203     History   Chief Complaint Chief Complaint  Patient presents with  . Shoulder Pain  . axilla discomfort    HPI Jordan [name] is a 40 y.o. male who presents with left-sided neck and shoulder pain. He was last seen 4 days ago for similar symptoms. Denies any injury. He states he does a lot of repetitive motions at his job. He has taken the last 3 days off been resting and taking medicines as prescribed. The pain has gotten better however has not gone away therefore he came in for another evaluation. He states that the pain is worse when he stands up or uses his left arm. He is worried that the pain is related to his heart. Denies history of hypertension, hyperlipidemia, diabetes, does not smoke, no personal history of cardiac or lung disease. No fever, syncope, trauma, loss of bowel/bladder function, saddle anesthesia, urinary retention, leg weakness or paresthesias.   HPI  History reviewed. No pertinent past medical history.  There are no active problems to display for this patient.   History reviewed. No pertinent surgical history.     Home Medications    Prior to Admission medications   Medication Sig Start Date End Date Taking? Authorizing Provider  benzonatate (TESSALON) 100 MG capsule Take 1 capsule (100 mg total) by mouth every 8 (eight) hours. 07/25/15   Garlon HatchetLisa M Sanders, PA-C  benzonatate (TESSALON) 100 MG capsule Take 1 capsule (100 mg total) by mouth every 8 (eight) hours. 08/11/15   Stevi Barrett, PA-C  cetirizine (ZYRTEC) 10 MG tablet Take 1 tablet (10 mg total) by mouth daily. 04/08/15   Ace GinsSerena Y Sam, PA-C  diphenhydrAMINE (BENADRYL) 25 MG tablet Take 1 tablet (25 mg total) by mouth every 6 (six) hours as needed for itching (Rash). 07/25/15   Garlon HatchetLisa M Sanders, PA-C  fluticasone (FLONASE) 50 MCG/ACT nasal spray Place 2 sprays into both nostrils daily. 04/08/15   Carlene CoriaSerena Y Sam, PA-C    HYDROcodone-acetaminophen (NORCO/VICODIN) 5-325 MG tablet Take 1 tablet by mouth every 4 (four) hours as needed. 04/03/16   Rolland PorterMark James, MD  ibuprofen (ADVIL,MOTRIN) 200 MG tablet Take 400-800 mg by mouth every 6 (six) hours as needed for mild pain.    Historical Provider, MD  loperamide (IMODIUM) 2 MG capsule Take 1 capsule (2 mg total) by mouth 4 (four) times daily as needed for diarrhea or loose stools. 12/22/14   Linwood DibblesJon Knapp, MD  methocarbamol (ROBAXIN) 500 MG tablet Take 1 tablet (500 mg total) by mouth 3 (three) times daily between meals as needed. 04/03/16   Rolland PorterMark James, MD  naproxen (NAPROSYN) 500 MG tablet Take 1 tablet (500 mg total) by mouth 2 (two) times daily. 04/03/16   Rolland PorterMark James, MD  naproxen sodium (ANAPROX) 220 MG tablet Take 220 mg by mouth 2 (two) times daily as needed (pain).    Historical Provider, MD  promethazine (PHENERGAN) 25 MG tablet Take 1 tablet (25 mg total) by mouth every 6 (six) hours as needed for nausea or vomiting. 12/22/14   Linwood DibblesJon Knapp, MD    Family History No family history on file.  Social History Social History  Substance Use Topics  . Smoking status: Never Smoker  . Smokeless tobacco: Never Used  . Alcohol use Yes     Comment: occ     Allergies   Patient has no known allergies.   Review of Systems Review of  Systems  Constitutional: Negative for fever.  Musculoskeletal: Positive for arthralgias, myalgias and neck pain. Negative for back pain.  All other systems reviewed and are negative.    Physical Exam Updated Vital Signs BP 124/79   Pulse 63   Temp 97.8 F (36.6 C) (Oral)   Resp 15   SpO2 98%   Physical Exam  Constitutional: He is oriented to person, place, and time. He appears well-developed and well-nourished. No distress.  HENT:  Head: Normocephalic and atraumatic.  Eyes: Conjunctivae are normal. Pupils are equal, round, and reactive to light. Right eye exhibits no discharge. Left eye exhibits no discharge. No scleral icterus.   Neck: Normal range of motion.  Cardiovascular: Normal rate.   Pulmonary/Chest: Effort normal. No respiratory distress.  Abdominal: He exhibits no distension.  Musculoskeletal:  Tenderness of left trapezius muscle and left lateral upper ribs under the axillary region  Neurological: He is alert and oriented to person, place, and time.  Skin: Skin is warm and dry.  Psychiatric: He has a normal mood and affect. His behavior is normal.  Nursing note and vitals reviewed.    ED Treatments / Results  Labs (all labs ordered are listed, but only abnormal results are displayed) Labs Reviewed - No data to display  EKG  EKG Interpretation  Date/Time:  Saturday April 07 2016 12:08:02 EST Ventricular Rate:  64 PR Interval:  164 QRS Duration: 80 QT Interval:  374 QTC Calculation: 385 R Axis:   111 Text Interpretation:  Normal sinus rhythm Right axis deviation Abnormal ECG No previous ECGs available Confirmed by ZACKOWSKI  MD, SCOTT ([id number]) on 04/07/2016 12:47:38 PM       Radiology No results found.  Procedures Procedures (including critical care time)  Medications Ordered in ED Medications - No data to display   Initial Impression / Assessment and Plan / ED Course  I have reviewed the triage vital signs and the nursing notes.  Pertinent labs & imaging results that were available during my care of the patient were reviewed by me and considered in my medical decision making (see chart for details).  Clinical Course    40 year old male with ongoing shoulder pain. EKG is NSR. Rx for Mobic given and gave reassurance that symptoms will get better as long as he continues medicines and rests. Also discussed treatment with ice/heat. Patient is NAD, non-toxic, with stable VS. Patient is informed of clinical course, understands medical decision making process, and agrees with plan. Opportunity for questions provided and all questions answered. Return precautions given.   Final Clinical  Impressions(s) / ED Diagnoses   Final diagnoses:  Acute pain of left shoulder  Neck pain on left side    New Prescriptions New Prescriptions   No medications on file     Bethel BornKelly Marie Cayce Paschal, PA-C 04/11/16 [id number]    Vanetta MuldersScott Zackowski, MD 04/11/16 [phone]

## 2016-04-30 ENCOUNTER — Emergency Department (HOSPITAL_COMMUNITY)
Admission: EM | Admit: 2016-04-30 | Discharge: 2016-04-30 | Disposition: A | Discharge status: Home / Self Care | Payer: No Typology Code available for payment source | Attending: Emergency Medicine | Admitting: Emergency Medicine

## 2016-04-30 ENCOUNTER — Encounter (HOSPITAL_COMMUNITY): Payer: Self-pay | Admitting: *Deleted

## 2016-04-30 DIAGNOSIS — J069 Acute upper respiratory infection, unspecified: Secondary | ICD-10-CM | POA: Insufficient documentation

## 2016-04-30 MED ORDER — FLUTICASONE PROPIONATE 50 MCG/ACT NA SUSP: 2.0000 | Freq: Every day | NASAL | 2 refills | Status: DC

## 2016-04-30 MED ORDER — BENZONATATE 100 MG PO CAPS: 100.0000 mg | ORAL_CAPSULE | Freq: Three times a day (TID) | ORAL | 0 refills | Status: DC

## 2016-04-30 MED ORDER — ONDANSETRON 4 MG PO TBDP
4.0000 mg | ORAL_TABLET | Freq: Once | ORAL | Status: AC
  Administered 2016-04-30: 4 mg via ORAL
  Filled 2016-04-30: qty 1

## 2016-04-30 NOTE — ED Notes (Signed)
Pt states he understands instructions. All questions answered. Home stable with steady gait.

## 2016-04-30 NOTE — ED Triage Notes (Signed)
Pt states cough since last Monday and emesis since this am.

## 2016-04-30 NOTE — ED Provider Notes (Signed)
[facility] Provider Note   CSN: [id number] Arrival date & time: 04/30/16  [id number]     History   Chief Complaint Chief Complaint  Patient presents with  . Cough  . Emesis    HPI Jordan [name] is a 40 y.o. male.  HPI Patient is a [age] year old male with no significant past medical history presents with a week of cough, nasal congestion and myalgias. Patient reports progressively worsening nonproductive cough, worse with lying down. He has attempted taking over-the-counter medications including NyQuil last night without relief. Over the past day he has had two episodes of posttussive emesis. Had subjective fevers with diaphoresis this morning. He reports multiple sick contacts including his son with similar symptoms over the past week. Denies chills, changes in bowel movements, dysuria, or other symptoms.    History reviewed. No pertinent past medical history.  There are no active problems to display for this patient.   History reviewed. No pertinent surgical history.     Home Medications    Prior to Admission medications   Medication Sig Start Date End Date Taking? Authorizing Provider  benzonatate (TESSALON) 100 MG capsule Take 1 capsule (100 mg total) by mouth every 8 (eight) hours. 04/30/16   Isa RankinAnn B Lizmarie Witters, MD  fluticasone (FLONASE) 50 MCG/ACT nasal spray Place 2 sprays into both nostrils daily. 04/30/16   Isa RankinAnn B Edge Mauger, MD  HYDROcodone-acetaminophen (NORCO/VICODIN) 5-325 MG tablet Take 1 tablet by mouth every 4 (four) hours as needed. 04/03/16   Rolland PorterMark James, MD  meloxicam (MOBIC) 15 MG tablet Take 1 tablet (15 mg total) by mouth daily. 04/07/16   Bethel BornKelly Marie Gekas, PA-C  methocarbamol (ROBAXIN) 500 MG tablet Take 1 tablet (500 mg total) by mouth 3 (three) times daily between meals as needed. 04/03/16   Rolland PorterMark James, MD  naproxen (NAPROSYN) 500 MG tablet Take 1 tablet (500 mg total) by mouth 2 (two) times daily. 04/03/16   Rolland PorterMark James, MD    Family History No family history on  file.  Social History Social History  Substance Use Topics  . Smoking status: Never Smoker  . Smokeless tobacco: Never Used  . Alcohol use Yes     Comment: occ     Allergies   Patient has no known allergies.   Review of Systems Review of Systems  Constitutional: Positive for fever (subjective). Negative for chills.  HENT: Positive for congestion, postnasal drip and rhinorrhea. Negative for ear pain and sore throat.   Eyes: Negative for pain and visual disturbance.  Respiratory: Positive for cough. Negative for shortness of breath.   Cardiovascular: Negative for chest pain and palpitations.  Gastrointestinal: Positive for nausea and vomiting. Negative for abdominal pain.  Genitourinary: Negative for dysuria and hematuria.  Musculoskeletal: Positive for myalgias. Negative for arthralgias and back pain.  Skin: Negative for color change and rash.  Neurological: Negative for seizures and syncope.  All other systems reviewed and are negative.    Physical Exam Updated Vital Signs BP 121/87 (BP Location: Right Arm)   Pulse 67   Temp 98.3 F (36.8 C)   Resp 16   SpO2 100%   Physical Exam  Constitutional: He appears well-developed and well-nourished.  HENT:  Head: Normocephalic and atraumatic.  Cerumen obscures bilateral TMs.  Eyes: Conjunctivae are normal.  Neck: Neck supple.  Cardiovascular: Normal rate and regular rhythm.   No murmur heard. Pulmonary/Chest: Effort normal and breath sounds normal. No respiratory distress.  Abdominal: Soft. There is no tenderness.  Musculoskeletal: He exhibits no edema.  Neurological: He is alert.  Skin: Skin is warm and dry.  Psychiatric: He has a normal mood and affect.  Nursing note and vitals reviewed.    ED Treatments / Results  Labs (all labs ordered are listed, but only abnormal results are displayed) Labs Reviewed - No data to display  EKG  EKG Interpretation None       Radiology No results  found.  Procedures Procedures (including critical care time)  Medications Ordered in ED Medications  ondansetron (ZOFRAN-ODT) disintegrating tablet 4 mg (4 mg Oral Given 04/30/16 0837)     Initial Impression / Assessment and Plan / ED Course  I have reviewed the triage vital signs and the nursing notes.  Pertinent labs & imaging results that were available during my care of the patient were reviewed by me and considered in my medical decision making (see chart for details).  Clinical Course     Patient is a 40 year old male with no significant past medical history presents with a week of cough, nasal congestion and myalgias. On presentation is afebrile, VSS, non-toxic appearing. No fever, tachycardia, hypoxia or respiratory distress. Exam without acute findings. Positive sick contacts. Likely viral URI. No further labs or imaging necessary at this time. Discharged in stable condition. Will prescribe Tessalon perls and Flonase spray for congestion. Symptomatic care discussed. Work note given. Advised PCP follow up in 2-3 days if not improving. Patient in agreement with plan.  Patient care supervised by Dr. Clydene PughKnott, ED attending  Final Clinical Impressions(s) / ED Diagnoses   Final diagnoses:  Upper respiratory tract infection, unspecified type    New Prescriptions Discharge Medication List as of 04/30/2016  8:33 AM    START taking these medications   Details  benzonatate (TESSALON) 100 MG capsule Take 1 capsule (100 mg total) by mouth every 8 (eight) hours., Starting Mon 04/30/2016, Print    fluticasone (FLONASE) 50 MCG/ACT nasal spray Place 2 sprays into both nostrils daily., Starting Mon 04/30/2016, Print         Isa RankinAnn B Tylia Ewell, MD 04/30/16 1757    Lyndal Pulleyaniel Knott, MD 04/30/16 1958

## 2016-06-18 ENCOUNTER — Ambulatory Visit (HOSPITAL_COMMUNITY)
Admission: EM | Admit: 2016-06-18 | Discharge: 2016-06-18 | Disposition: A | Discharge status: Home / Self Care | Payer: No Typology Code available for payment source | Attending: Family Medicine | Admitting: Family Medicine

## 2016-06-18 ENCOUNTER — Encounter (HOSPITAL_COMMUNITY): Payer: Self-pay | Admitting: Family Medicine

## 2016-06-18 DIAGNOSIS — R42 Dizziness and giddiness: Secondary | ICD-10-CM

## 2016-06-18 DIAGNOSIS — B349 Viral infection, unspecified: Secondary | ICD-10-CM

## 2016-06-18 DIAGNOSIS — G44209 Tension-type headache, unspecified, not intractable: Secondary | ICD-10-CM | POA: Diagnosis not present

## 2016-06-18 MED ORDER — ONDANSETRON 8 MG PO TBDP: 8.0000 mg | ORAL_TABLET | Freq: Three times a day (TID) | ORAL | 0 refills | Status: DC | PRN

## 2016-06-18 MED ORDER — MECLIZINE HCL 25 MG PO TABS: ORAL_TABLET | ORAL | 0 refills | Status: DC

## 2016-06-18 MED ORDER — NAPROXEN 500 MG PO TABS: ORAL_TABLET | ORAL | 0 refills | Status: DC

## 2016-06-18 NOTE — ED Triage Notes (Signed)
Pt here for congestion that stated this week and today he is here with headache, nausea and dizziness.

## 2016-06-18 NOTE — ED Provider Notes (Signed)
CSN: [id number]     Arrival date & time 06/18/16  1518 History   First MD Initiated Contact with Patient 06/18/16 1603     Chief Complaint  Patient presents with  . Headache  . Nasal Congestion  . Nausea   (Consider location/radiation/quality/duration/timing/severity/associated sxs/prior Treatment) Patient c/o dizziness, headache, and nausea today.  He states he has had these sx's on and offfor last week.   The history is provided by the patient.  Headache  Pain location:  Generalized Severity currently:  4/10 Onset quality:  Sudden Timing:  Constant Chronicity:  New Similar to prior headaches: no   Relieved by:  Nothing Worsened by:  Nothing Associated symptoms: fatigue     History reviewed. No pertinent past medical history. History reviewed. No pertinent surgical history. History reviewed. No pertinent family history. Social History  Substance Use Topics  . Smoking status: Never Smoker  . Smokeless tobacco: Never Used  . Alcohol use Yes     Comment: occ    Review of Systems  Constitutional: Positive for fatigue.  HENT: Negative.   Eyes: Negative.   Respiratory: Negative.   Cardiovascular: Negative.   Gastrointestinal: Negative.   Endocrine: Negative.   Genitourinary: Negative.   Musculoskeletal: Negative.   Allergic/Immunologic: Negative.   Neurological: Positive for headaches.  Hematological: Negative.   Psychiatric/Behavioral: Negative.     Allergies  Patient has no known allergies.  Home Medications   Prior to Admission medications   Medication Sig Start Date End Date Taking? Authorizing Provider  meclizine (ANTIVERT) 25 MG tablet One po tid prn dizziness 06/18/16   Deatra CanterWilliam J Velton Roselle, FNP  naproxen (NAPROSYN) 500 MG tablet One po bid prn headaches 06/18/16   Deatra CanterWilliam J Yaneli Keithley, FNP  ondansetron (ZOFRAN ODT) 8 MG disintegrating tablet Take 1 tablet (8 mg total) by mouth every 8 (eight) hours as needed for nausea or vomiting. 06/18/16   Deatra CanterWilliam J Sameera Betton,  FNP   Meds Ordered and Administered this Visit  Medications - No data to display  BP 136/85   Pulse 66   Temp 98.7 F (37.1 C)   Resp 18   SpO2 95%  No data found.   Physical Exam  Constitutional: He appears well-developed and well-nourished.  HENT:  Head: Normocephalic and atraumatic.  Right Ear: External ear normal.  Left Ear: External ear normal.  Mouth/Throat: Oropharynx is clear and moist.  Eyes: Conjunctivae and EOM are normal. Pupils are equal, round, and reactive to light.  Neck: Normal range of motion. Neck supple.  Cardiovascular: Normal rate, regular rhythm and normal heart sounds.   Pulmonary/Chest: Effort normal and breath sounds normal.  Abdominal: Soft. Bowel sounds are normal.  Nursing note and vitals reviewed.   Urgent Care Course     Procedures (including critical care time)  Labs Review Labs Reviewed - No data to display  Imaging Review No results found.   Visual Acuity Review  Right Eye Distance:   Left Eye Distance:   Bilateral Distance:    Right Eye Near:   Left Eye Near:    Bilateral Near:         MDM   1. Vertigo   2. Tension-type headache, not intractable, unspecified chronicity pattern   3. Viral syndrome    antivert zofran Naprosyn      Deatra CanterWilliam J Tyan Dy, FNP 06/18/16 [phone]

## 2016-08-31 ENCOUNTER — Emergency Department (HOSPITAL_COMMUNITY)
Admission: EM | Admit: 2016-08-31 | Discharge: 2016-08-31 | Disposition: A | Discharge status: Home / Self Care | Payer: No Typology Code available for payment source | Attending: Emergency Medicine | Admitting: Emergency Medicine

## 2016-08-31 ENCOUNTER — Encounter (HOSPITAL_COMMUNITY): Payer: Self-pay | Admitting: Nurse Practitioner

## 2016-08-31 DIAGNOSIS — R112 Nausea with vomiting, unspecified: Secondary | ICD-10-CM

## 2016-08-31 DIAGNOSIS — J069 Acute upper respiratory infection, unspecified: Secondary | ICD-10-CM | POA: Insufficient documentation

## 2016-08-31 LAB — COMPREHENSIVE METABOLIC PANEL
ALBUMIN: 4.2 g/dL (ref 3.5–5.0)
ALT: 30 U/L (ref 17–63)
ANION GAP: 9 (ref 5–15)
AST: 31 U/L (ref 15–41)
Alkaline Phosphatase: 62 U/L (ref 38–126)
BUN: 10 mg/dL (ref 6–20)
CALCIUM: 9.3 mg/dL (ref 8.9–10.3)
CHLORIDE: 104 mmol/L (ref 101–111)
CO2: 28 mmol/L (ref 22–32)
Creatinine, Ser: 1.03 mg/dL (ref 0.61–1.24)
GFR calc Af Amer: >60 mL/min (ref >60)
GFR calc non Af Amer: >60 mL/min (ref >60)
GLUCOSE: 98 mg/dL (ref 65–99)
POTASSIUM: 3.7 mmol/L (ref 3.5–5.1)
Sodium: 141 mmol/L (ref 135–145)
Total Bilirubin: 0.6 mg/dL (ref 0.3–1.2)
Total Protein: 8.1 g/dL (ref 6.5–8.1)

## 2016-08-31 LAB — URINALYSIS, ROUTINE W REFLEX MICROSCOPIC
Bilirubin Urine: NEGATIVE
Glucose, UA: NEGATIVE mg/dL
HGB URINE DIPSTICK: NEGATIVE
Ketones, ur: NEGATIVE mg/dL
Leukocytes, UA: NEGATIVE
Nitrite: NEGATIVE
PH: 6 (ref 5.0–8.0)
Protein, ur: NEGATIVE mg/dL
SPECIFIC GRAVITY, URINE: 1.02 (ref 1.005–1.030)

## 2016-08-31 LAB — CBC
HEMATOCRIT: 41.2 % (ref 39.0–52.0)
HEMOGLOBIN: 13.6 g/dL (ref 13.0–17.0)
MCH: 29.6 pg (ref 26.0–34.0)
MCHC: 33 g/dL (ref 30.0–36.0)
MCV: 89.8 fL (ref 78.0–100.0)
Platelets: 264 10*3/uL (ref 150–400)
RBC: 4.59 MIL/uL (ref 4.22–5.81)
RDW: 13.5 % (ref 11.5–15.5)
WBC: 6.8 10*3/uL (ref 4.0–10.5)

## 2016-08-31 MED ORDER — ONDANSETRON 4 MG PO TBDP: 4.0000 mg | ORAL_TABLET | Freq: Three times a day (TID) | ORAL | 0 refills | Status: DC | PRN

## 2016-08-31 MED ORDER — ONDANSETRON 4 MG PO TBDP
8.0000 mg | ORAL_TABLET | Freq: Once | ORAL | Status: AC
  Administered 2016-08-31: 8 mg via ORAL
  Filled 2016-08-31: qty 2

## 2016-08-31 NOTE — ED Provider Notes (Signed)
[facility] Provider Note   CSN: [id number] Arrival date & time: 08/31/16  1633     History   Chief Complaint Chief Complaint  Patient presents with  . Nasal Congestion  . Emesis    HPI Jordan [name] is a 41 y.o. male.  HPI SUBJECTIVE:  Jordan [name] is a 41 y.o. male who complains of congestion, nasal blockage, post nasal drip, dry cough, myalgias, chills and headache for 1 days. Pt also has nausea and emesis x 4 without abd pain. He denies a history of chest pain and wheezing and denies a history of asthma.   Pt's kids have had URI like symptoms without emesis. No suspicious po intake. Marland Kitchen   History reviewed. No pertinent past medical history.  There are no active problems to display for this patient.   History reviewed. No pertinent surgical history.     Home Medications    Prior to Admission medications   Medication Sig Start Date End Date Taking? Authorizing Provider  omeprazole (PRILOSEC OTC) 20 MG tablet Take 20 mg by mouth daily.   Yes Historical Provider, MD  ondansetron (ZOFRAN ODT) 4 MG disintegrating tablet Take 1 tablet (4 mg total) by mouth every 8 (eight) hours as needed for nausea or vomiting. 08/31/16   Derwood Kaplan, MD    Family History History reviewed. No pertinent family history.  Social History Social History  Substance Use Topics  . Smoking status: Never Smoker  . Smokeless tobacco: Never Used  . Alcohol use Yes     Comment: occ     Allergies   Patient has no known allergies.   Review of Systems Review of Systems  Constitutional: Positive for activity change.  HENT: Positive for congestion, postnasal drip, rhinorrhea, sinus pain and voice change. Negative for sore throat.   Gastrointestinal: Positive for nausea and vomiting.  Allergic/Immunologic: Negative for immunocompromised state.  Neurological: Positive for headaches.     Physical Exam Updated Vital Signs BP 118/90   Pulse 73   Temp 97.8 F (36.6 C) (Oral)    Resp 16   SpO2 99%   Physical Exam  Constitutional: He is oriented to person, place, and time. He appears well-developed.  HENT:  Head: Atraumatic.  No meningeal signs  Neck: Neck supple.  Cardiovascular: Normal rate.   Pulmonary/Chest: Effort normal.  Neurological: He is alert and oriented to person, place, and time.  Skin: Skin is warm.  Nursing note and vitals reviewed.    ED Treatments / Results  Labs (all labs ordered are listed, but only abnormal results are displayed) Labs Reviewed  COMPREHENSIVE METABOLIC PANEL  CBC  URINALYSIS, ROUTINE W REFLEX MICROSCOPIC    EKG  EKG Interpretation None       Radiology No results found.  Procedures Procedures (including critical care time)  Medications Ordered in ED Medications  ondansetron (ZOFRAN-ODT) disintegrating tablet 8 mg (8 mg Oral Given 08/31/16 2014)     Initial Impression / Assessment and Plan / ED Course  I have reviewed the triage vital signs and the nursing notes.  Pertinent labs & imaging results that were available during my care of the patient were reviewed by me and considered in my medical decision making (see chart for details).  Clinical Course as of Aug 31 2113  Fri Aug 31, 2016  2114 Pt reassessed. Pt's VSS and WNL. Pt's cap refill < 3 seconds. Pt has been hydrated in the ER and now passed po challenge. We will discharge with antiemetic. Strict ER  return precautions have been discussed and pt will return if he is unable to tolerate fluids and symptoms are getting worse.   [AN]    Clinical Course User Index [AN] Derwood Kaplan, MD    ASSESSMENT:  viral upper respiratory illness  PLAN: Symptomatic therapy suggested: push fluids, rest and return office visit prn if symptoms persist or worsen. Lack of antibiotic effectiveness discussed with him. Call or return to clinic prn if these symptoms worsen or fail to improve as anticipated  Final Clinical Impressions(s) / ED Diagnoses   Final  diagnoses:  Viral upper respiratory tract infection  Non-intractable vomiting with nausea, unspecified vomiting type    New Prescriptions New Prescriptions   ONDANSETRON (ZOFRAN ODT) 4 MG DISINTEGRATING TABLET    Take 1 tablet (4 mg total) by mouth every 8 (eight) hours as needed for nausea or vomiting.     Derwood Kaplan, MD 08/31/16 2115

## 2016-08-31 NOTE — ED Triage Notes (Signed)
Pt presents with c/o nasal congestion and vomiting. He woke this morning with nasal congestion, then when he got to work he began vomiting. He reports headache and loss of appetite. He denies pain, fevers, cough, urinary symptoms, constipation, diarrhea. He had to leave work early due to vomiting

## 2016-08-31 NOTE — Discharge Instructions (Signed)
We think what you have is a viral syndrome - the treatment for which is symptomatic relief only, and your body will fight the infection off in a few days. °We are prescribing you some meds for pain and fevers. °See your primary care doctor in 1 week if the symptoms dont improve. ° °Please return to the ER if your symptoms worsen; you have increased pain, fevers, chills, inability to keep any medications down, confusion. Otherwise see the outpatient doctor as requested. ° °

## 2016-08-31 NOTE — ED Notes (Signed)
Reports being a little queasy but able to keep ginger ale down.

## 2016-12-24 ENCOUNTER — Encounter (HOSPITAL_COMMUNITY): Payer: Self-pay

## 2016-12-24 ENCOUNTER — Emergency Department (HOSPITAL_COMMUNITY)
Admission: EM | Admit: 2016-12-24 | Discharge: 2016-12-24 | Disposition: A | Discharge status: Home / Self Care | Payer: Self-pay | Attending: Emergency Medicine | Admitting: Emergency Medicine

## 2016-12-24 DIAGNOSIS — M791 Myalgia: Secondary | ICD-10-CM | POA: Insufficient documentation

## 2016-12-24 DIAGNOSIS — M7918 Myalgia, other site: Secondary | ICD-10-CM

## 2016-12-24 MED ORDER — OXYCODONE HCL 5 MG PO TABS
5.0000 mg | ORAL_TABLET | Freq: Once | ORAL | Status: AC
  Administered 2016-12-24: 5 mg via ORAL
  Filled 2016-12-24: qty 1

## 2016-12-24 MED ORDER — KETOROLAC TROMETHAMINE 60 MG/2ML IM SOLN
15.0000 mg | Freq: Once | INTRAMUSCULAR | Status: AC
  Administered 2016-12-24: 15 mg via INTRAMUSCULAR
  Filled 2016-12-24: qty 2

## 2016-12-24 MED ORDER — ACETAMINOPHEN 500 MG PO TABS
1000.0000 mg | ORAL_TABLET | Freq: Once | ORAL | Status: AC
  Administered 2016-12-24: 1000 mg via ORAL
  Filled 2016-12-24: qty 2

## 2016-12-24 NOTE — ED Triage Notes (Signed)
Pt states he has had pain in his legs bilaterally X1 month. 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 Describes the pain as a "pressure." Pt denies any recent extended travel or shortness of breath.

## 2016-12-24 NOTE — Discharge Instructions (Signed)
Take 4 over the counter ibuprofen tablets 3 times a day or 2 over-the-counter naproxen tablets twice a day for pain. Also take tylenol 1000mg(2 extra strength) four times a day.    

## 2016-12-24 NOTE — ED Provider Notes (Signed)
[facility]-EMERGENCY DEPT Provider Note   CSN: [id number] Arrival date & time: 12/24/16  1350     History   Chief Complaint Chief Complaint  Patient presents with  . Leg Pain    HPI Jordan [name] is a 41 y.o. male.  41 yo M with a chief complaint of pelvis pain. 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 He's been having pain to the anterior aspect of his pelvis as well as down around his gluteus. Worse with walking and sitting. No pain with standing.   The history is provided by the patient.  Illness  This is a new problem. The current episode started more than 2 days ago. The problem occurs constantly. The problem has been gradually worsening. Pertinent negatives include no chest pain, no abdominal pain, no headaches and no shortness of breath. Nothing aggravates the symptoms. Nothing relieves the symptoms. He has tried nothing for the symptoms. The treatment provided no relief.    History reviewed. No pertinent past medical history.  There are no active problems to display for this patient.   History reviewed. No pertinent surgical history.     Home Medications    Prior to Admission medications   Medication Sig Start Date End Date Taking? Authorizing Provider  omeprazole (PRILOSEC OTC) 20 MG tablet Take 20 mg by mouth daily.    [provider]  ondansetron (ZOFRAN ODT) 4 MG disintegrating tablet Take 1 tablet (4 mg total) by mouth every 8 (eight) hours as needed for nausea or vomiting. 08/31/16   Derwood KaplanNanavati, Ankit, MD    Family History History reviewed. No pertinent family history.  Social History Social History  Substance Use Topics  . Smoking status: Never Smoker  . Smokeless tobacco: Never Used  . Alcohol use Yes     Comment: occ     Allergies   Patient has no known allergies.   Review of Systems Review of Systems  Constitutional: Negative for chills and fever.  HENT: Negative for congestion and  facial swelling.   Eyes: Negative for discharge and visual disturbance.  Respiratory: Negative for shortness of breath.   Cardiovascular: Negative for chest pain and palpitations.  Gastrointestinal: Negative for abdominal pain, diarrhea and vomiting.  Musculoskeletal: Positive for arthralgias and myalgias.  Skin: Negative for color change and rash.  Neurological: Negative for tremors, syncope and headaches.  Psychiatric/Behavioral: Negative for confusion and dysphoric mood.     Physical Exam Updated Vital Signs BP 128/83 (BP Location: Right Arm)   Pulse (!) 55   Temp 98.3 F (36.8 C) (Oral)   Resp 16   SpO2 100%   Physical Exam  Constitutional: He is oriented to person, place, and time. He appears well-developed and well-nourished.  HENT:  Head: Normocephalic and atraumatic.  Eyes: Pupils are equal, round, and reactive to light. EOM are normal.  Neck: Normal range of motion. Neck supple. No JVD present.  Cardiovascular: Normal rate and regular rhythm.  Exam reveals no gallop and no friction rub.   No murmur heard. Pulmonary/Chest: No respiratory distress. He has no wheezes.  Abdominal: He exhibits no distension and no mass. There is no tenderness. There is no rebound and no guarding.  Musculoskeletal: Normal range of motion. He exhibits tenderness.  Tenderness to the inferior pubic ramus. There is no mass. No noted testicular pain. Pain also noted to the ischium bilaterally.  Neurological: He is alert and oriented to person, place, and time.  Skin: No rash noted. No pallor.  Psychiatric: He has a normal mood and affect. His behavior is normal.  Nursing note and vitals reviewed.    ED Treatments / Results  Labs (all labs ordered are listed, but only abnormal results are displayed) Labs Reviewed - No data to display  EKG  EKG Interpretation None       Radiology No results found.  Procedures Procedures (including critical care time)  Medications Ordered in  ED Medications  ketorolac (TORADOL) injection 15 mg (not administered)  acetaminophen (TYLENOL) tablet 1,000 mg (not administered)  oxyCODONE (Oxy IR/ROXICODONE) immediate release tablet 5 mg (not administered)     Initial Impression / Assessment and Plan / ED Course  I have reviewed the triage vital signs and the nursing notes.  Pertinent labs & imaging results that were available during my care of the patient were reviewed by me and considered in my medical decision making (see chart for details).     41 yo M With a chief complaint of pain in his pelvis. I think most likely this is a pelvic floor injury. We'll have him stay to work for a couple days Tylenol and NSAIDs. Right and limited duty for the next week. PCP follow-up. This could also be early hernia, was unable to palpate any defects.  4:07 PM:  I have discussed the diagnosis/risks/treatment options with the patient and family and believe the pt to be eligible for discharge home to follow-up with PCP. We also discussed returning to the ED immediately if new or worsening sx occur. We discussed the sx which are most concerning (e.g., sudden worsening pain, fever, inability to tolerate by mouth) that necessitate immediate return. Medications administered to the patient during their visit and any new prescriptions provided to the patient are listed below.  Medications given during this visit Medications  ketorolac (TORADOL) injection 15 mg (not administered)  acetaminophen (TYLENOL) tablet 1,000 mg (not administered)  oxyCODONE (Oxy IR/ROXICODONE) immediate release tablet 5 mg (not administered)     The patient appears reasonably screen and/or stabilized for discharge and I doubt any other medical condition or other [facility] requiring further screening, evaluation, or treatment in the ED at this time prior to discharge.    Final Clinical Impressions(s) / ED Diagnoses   Final diagnoses:  Myalgia of pelvic floor    New  Prescriptions New Prescriptions   No medications on file     Melene PlanFloyd, Nicolina Hirt, DO 12/24/16 1607

## 2017-04-21 ENCOUNTER — Encounter (HOSPITAL_COMMUNITY): Payer: Self-pay | Admitting: *Deleted

## 2017-04-21 ENCOUNTER — Other Ambulatory Visit: Payer: Self-pay

## 2017-04-21 ENCOUNTER — Emergency Department (HOSPITAL_COMMUNITY): Payer: Self-pay

## 2017-04-21 ENCOUNTER — Emergency Department (HOSPITAL_COMMUNITY)
Admission: EM | Admit: 2017-04-21 | Discharge: 2017-04-21 | Disposition: A | Discharge status: Home / Self Care | Payer: Self-pay | Attending: Emergency Medicine | Admitting: Emergency Medicine

## 2017-04-21 DIAGNOSIS — Z79899 Other long term (current) drug therapy: Secondary | ICD-10-CM | POA: Insufficient documentation

## 2017-04-21 DIAGNOSIS — R1032 Left lower quadrant pain: Secondary | ICD-10-CM

## 2017-04-21 DIAGNOSIS — R1030 Lower abdominal pain, unspecified: Secondary | ICD-10-CM | POA: Insufficient documentation

## 2017-04-21 DIAGNOSIS — R1031 Right lower quadrant pain: Secondary | ICD-10-CM

## 2017-04-21 LAB — I-STAT CHEM 8, ED
BUN: 21 mg/dL — ABNORMAL HIGH (ref 6–20)
Calcium, Ion: 1.19 mmol/L (ref 1.15–1.40)
Chloride: 104 mmol/L (ref 101–111)
Creatinine, Ser: 1 mg/dL (ref 0.61–1.24)
Glucose, Bld: 99 mg/dL (ref 65–99)
HCT: 44 % (ref 39.0–52.0)
Hemoglobin: 15 g/dL (ref 13.0–17.0)
Potassium: 3.9 mmol/L (ref 3.5–5.1)
Sodium: 140 mmol/L (ref 135–145)
TCO2: 27 mmol/L (ref 22–32)

## 2017-04-21 LAB — URINALYSIS, ROUTINE W REFLEX MICROSCOPIC
Bilirubin Urine: NEGATIVE
Glucose, UA: NEGATIVE mg/dL
Hgb urine dipstick: NEGATIVE
Ketones, ur: NEGATIVE mg/dL
Leukocytes, UA: NEGATIVE
Nitrite: NEGATIVE
Protein, ur: NEGATIVE mg/dL
Specific Gravity, Urine: 1.015 (ref 1.005–1.030)
pH: 5 (ref 5.0–8.0)

## 2017-04-21 MED ORDER — PREDNISONE 50 MG PO TABS: 50.0000 mg | ORAL_TABLET | Freq: Every day | ORAL | 0 refills | Status: DC

## 2017-04-21 NOTE — ED Notes (Signed)
Patient transported to CT 

## 2017-04-21 NOTE — Discharge Instructions (Signed)
You will need to follow-up with a primary care provider.  Your CT scan and laboratory testing did not show any significant abnormalities.  There is no signs of infection or hernia noted.  We will give you medicines to try to help with any inflammatory process that could be causing this.

## 2017-04-21 NOTE — ED Notes (Signed)
Pt stating he wants non contrast CT scan because he is afraid of needles and does not want IV. PA made aware.

## 2017-04-21 NOTE — ED Triage Notes (Signed)
Pt states was dx with myalgia of the pelvic floor several months ago, but the s/s have not improved.

## 2017-04-22 NOTE — ED Provider Notes (Signed)
[facility]   CSN: [id number] Arrival date & time: 04/21/17  [id number]     History   Chief Complaint Chief Complaint  Patient presents with  . Muscle Pain    HPI Jordan [name] is a 41 y.o. male.  HPI Patient presents to the emergency department with right lower groin pain for the last few months.  Patient states that he was seen here several months ago for similar complaints.  Patient states that nothing seems to make the condition better or worse.  He states that the pain is intermittent.  He states that he has not taken any medications prior to arrival.  He states that he has vomited occasionally during this timeframe.abdominal pain, nausea, vomiting, diarrhea, rash, back pain, dysuria, hematemesis, bloody stool, near syncope, or syncope. History reviewed. No pertinent past medical history.  There are no active problems to display for this patient.   History reviewed. No pertinent surgical history.     Home Medications    Prior to Admission medications   Medication Sig Start Date End Date Taking? Authorizing Provider  acetaminophen (TYLENOL) 500 MG tablet Take 500 mg by mouth every 6 (six) hours as needed for mild pain.   Yes [provider]  ibuprofen (ADVIL,MOTRIN) 800 MG tablet Take 800 mg by mouth every 8 (eight) hours as needed for mild pain.   Yes [provider]  pyridOXINE (VITAMIN B-6) 100 MG tablet Take 100 mg by mouth daily.   Yes [provider]  ondansetron (ZOFRAN ODT) 4 MG disintegrating tablet Take 1 tablet (4 mg total) by mouth every 8 (eight) hours as needed for nausea or vomiting. Patient not taking: Reported on 04/21/2017 08/31/16   Derwood KaplanNanavati, Ankit, MD  predniSONE (DELTASONE) 50 MG tablet Take 1 tablet (50 mg total) by mouth daily. 04/21/17   Charlestine NightLawyer, Airyana Sprunger, PA-C    Family History No family history on file.  Social History Social History   Tobacco Use  . Smoking status:  Never Smoker  . Smokeless tobacco: Never Used  Substance Use Topics  . Alcohol use: Yes    Comment: occ  . Drug use: No     Allergies   Patient has no known allergies.   Review of Systems Review of Systems All other systems negative except as documented in the HPI. All pertinent positives and negatives as reviewed in the HPI.   Physical Exam Updated Vital Signs BP 122/84 (BP Location: Right Arm)   Pulse 65   Temp (!) 97.2 F (36.2 C) (Oral)   Resp 16   Ht 5\' 8"  (1.727 m)   Wt 77.1 kg (170 lb)   SpO2 100%   BMI 25.85 kg/m   Physical Exam  Constitutional: He is oriented to person, place, and time. He appears well-developed and well-nourished. No distress.  HENT:  Head: Normocephalic and atraumatic.  Mouth/Throat: Oropharynx is clear and moist.  Eyes: Pupils are equal, round, and reactive to light.  Neck: Normal range of motion. Neck supple.  Cardiovascular: Normal rate, regular rhythm and normal heart sounds. Exam reveals no gallop and no friction rub.  No murmur heard. Pulmonary/Chest: Effort normal and breath sounds normal. No respiratory distress. He has no wheezes.  Abdominal: Soft. Bowel sounds are normal. He exhibits no distension and no mass. There is tenderness in the suprapubic area. There is no guarding.    Genitourinary: Penis normal. Right testis shows no mass and no tenderness. Left testis shows no mass and no  tenderness. No penile erythema. No discharge found.  Lymphadenopathy: No inguinal adenopathy noted on the right or left side.  Neurological: He is alert and oriented to person, place, and time. He exhibits normal muscle tone. Coordination normal.  Skin: Skin is warm and dry. Capillary refill takes less than 2 seconds. No rash noted. No erythema.  Psychiatric: He has a normal mood and affect. His behavior is normal.  Nursing note and vitals reviewed.    ED Treatments / Results  Labs (all labs ordered are listed, but only abnormal results are  displayed) Labs Reviewed  I-STAT CHEM 8, ED - Abnormal; Notable for the following components:      Result Value   BUN 21 (*)    All other components within normal limits  URINALYSIS, ROUTINE W REFLEX MICROSCOPIC    EKG  EKG Interpretation None       Radiology Ct Renal Stone Study  Result Date: 04/21/2017 CLINICAL DATA:  41 year old male with flank pain. EXAM: CT ABDOMEN AND PELVIS WITHOUT CONTRAST TECHNIQUE: Multidetector CT imaging of the abdomen and pelvis was performed following the standard protocol without IV contrast. COMPARISON:  None. FINDINGS: Lower chest: No acute abnormality. Hepatobiliary: No focal liver abnormality is seen. No gallstones, gallbladder wall thickening, or biliary dilatation. Pancreas: Unremarkable. No pancreatic ductal dilatation or surrounding inflammatory changes. Spleen: Normal in size without focal abnormality. Adrenals/Urinary Tract: Adrenal glands are unremarkable. Kidneys are normal, without renal calculi, focal lesion, or hydronephrosis. Bladder is unremarkable. Stomach/Bowel: Stomach is within normal limits. Appendix appears normal. No evidence of bowel wall thickening, distention, or inflammatory changes. Vascular/Lymphatic: No significant vascular findings are present. No enlarged abdominal or pelvic lymph nodes. Reproductive: Prostate is unremarkable. Other: No abdominal wall hernia or abnormality. No abdominopelvic ascites. Musculoskeletal: No acute or significant osseous findings. IMPRESSION: Unremarkable CT evaluation of the abdomen and pelvis. Specifically, no evidence for nephroureterolithiasis. Electronically Signed   By: Sande BrothersSerena  Chacko M.D.   On: 04/21/2017 10:21    Procedures Procedures (including critical care time)  Medications Ordered in ED Medications - No data to display   Initial Impression / Assessment and Plan / ED Course  I have reviewed the triage vital signs and the nursing notes.  Pertinent labs & imaging results that were  available during my care of the patient were reviewed by me and considered in my medical decision making (see chart for details).    Base of the patient's testing today and CT scans I do not see any specific cause for his pain.  I will have him follow-up with GI specialist and primary care.  The patient is advised that this could be an inflammatory process.  The patient is advised to return for any worsening in his condition I have answered all his questions and the patient agrees the plan.  I do not see any signs of hernia at this point.  We will have our case manager call the patient to help attempt to set up primary care follow-up  Final Clinical Impressions(s) / ED Diagnoses   Final diagnoses:  Bilateral groin pain    ED Discharge Orders        Ordered    predniSONE (DELTASONE) 50 MG tablet  Daily     04/21/17 [id number]       Darrek Leasure, PA-C 04/30/17 [id number]    Mancel BaleWentz, Elliott, MD 04/30/17 [phone]

## 2017-05-29 ENCOUNTER — Ambulatory Visit (HOSPITAL_COMMUNITY)
Admission: EM | Admit: 2017-05-29 | Discharge: 2017-05-29 | Disposition: A | Discharge status: Home / Self Care | Payer: Self-pay | Attending: Urgent Care | Admitting: Urgent Care

## 2017-05-29 ENCOUNTER — Encounter (HOSPITAL_COMMUNITY): Payer: Self-pay | Admitting: Emergency Medicine

## 2017-05-29 ENCOUNTER — Other Ambulatory Visit: Payer: Self-pay

## 2017-05-29 DIAGNOSIS — H9201 Otalgia, right ear: Secondary | ICD-10-CM

## 2017-05-29 DIAGNOSIS — R0982 Postnasal drip: Secondary | ICD-10-CM

## 2017-05-29 DIAGNOSIS — R1013 Epigastric pain: Secondary | ICD-10-CM

## 2017-05-29 DIAGNOSIS — R0981 Nasal congestion: Secondary | ICD-10-CM

## 2017-05-29 DIAGNOSIS — R112 Nausea with vomiting, unspecified: Secondary | ICD-10-CM

## 2017-05-29 DIAGNOSIS — R070 Pain in throat: Secondary | ICD-10-CM

## 2017-05-29 LAB — POCT H PYLORI SCREEN: H. PYLORI SCREEN, POC: NEGATIVE

## 2017-05-29 MED ORDER — ONDANSETRON 8 MG PO TBDP: 8.0000 mg | ORAL_TABLET | Freq: Three times a day (TID) | ORAL | 0 refills | Status: DC | PRN

## 2017-05-29 MED ORDER — PSEUDOEPHEDRINE HCL 60 MG PO TABS: 60.0000 mg | ORAL_TABLET | Freq: Three times a day (TID) | ORAL | 0 refills | Status: DC | PRN

## 2017-05-29 MED ORDER — CETIRIZINE HCL 10 MG PO TABS: 10.0000 mg | ORAL_TABLET | Freq: Every day | ORAL | 0 refills | Status: DC

## 2017-05-29 NOTE — ED Provider Notes (Signed)
  MRN: [id number] DOB: 07/12/1975  Subjective:   Jordan [name] is a 42 y.o. male presenting for 3 day history of nausea, decreased appetite, subjective fever. Reports that he was trying vigorously to vomit today, saw some streaks of blood. Has also had left ear discomfort, right sinus congestion, scratchy throat at night. Denies sinus pain, headache, sore throat, chest pain, shob, wheezing, cough. Has tried otc cold medications including Sudafed. Denies smoking cigarettes.  Jordan [name] is not currently taking any medications and has No Known Allergies.  Jordan [name] denies past medical and surgical history.   Objective:   Vitals: BP (!) 135/95 (BP Location: Left Arm)   Pulse 61   Temp 98.6 F (37 C) (Oral)   SpO2 97%   Physical Exam  Constitutional: He is oriented to person, place, and time. He appears well-developed and well-nourished.  HENT:  TM's intact bilaterally, no effusions or erythema. Nasal turbinates pink, dry, nasal passages patent. No sinus tenderness. Oropharynx with significant post-nasal drainage, mucous membranes moist.    Eyes: Right eye exhibits no discharge. Left eye exhibits no discharge.  Neck: Normal range of motion. Neck supple. No thyromegaly present.  Cardiovascular: Normal rate, regular rhythm and intact distal pulses. Exam reveals no gallop and no friction rub.  No murmur heard. Pulmonary/Chest: No respiratory distress. He has no wheezes. He has no rales.  Abdominal: Soft. Bowel sounds are normal. He exhibits no distension and no mass. There is tenderness (generalized throughout, upper abdomen worse than lower abdomen). There is no guarding.  Lymphadenopathy:    He has no cervical adenopathy.  Neurological: He is alert and oriented to person, place, and time.  Skin: Skin is warm and dry.  Psychiatric: He has a normal mood and affect.   Results for orders placed or performed during the hospital encounter of 05/29/17 (from the past 24 hour(s))  H.pylori screen, POC      Status: None   Collection Time: 05/29/17  3:58 PM  Result Value Ref Range   H. PYLORI SCREEN, POC NEGATIVE NEGATIVE   Assessment and Plan :   Abdominal discomfort, epigastric  Nausea and vomiting, intractability of vomiting not specified, unspecified vomiting type  Throat discomfort  Nasal congestion  Discomfort of right ear  Post-nasal drainage  Recommended supportive care to patient for viral illness that is likely causing nausea from his post-nasal drainage. Zofran for nausea and vomiting. Counseled patient on potential for adverse effects with medications prescribed today, patient verbalized understanding. Return-to-clinic precautions discussed, patient verbalized understanding.    Wallis BambergMani, Jermiah Howton, [place] JerseyPA-C 05/29/17 1621

## 2017-05-29 NOTE — ED Triage Notes (Signed)
Pt reports nausea for 3 days and one episode of bloody vomit today.  He also reports waking up in a pool of sweat today.

## 2018-03-08 ENCOUNTER — Other Ambulatory Visit: Payer: Self-pay

## 2018-03-08 ENCOUNTER — Emergency Department (HOSPITAL_COMMUNITY)
Admission: EM | Admit: 2018-03-08 | Discharge: 2018-03-08 | Disposition: A | Discharge status: Home / Self Care | Payer: Self-pay | Attending: Emergency Medicine | Admitting: Emergency Medicine

## 2018-03-08 ENCOUNTER — Encounter (HOSPITAL_COMMUNITY): Payer: Self-pay | Admitting: Emergency Medicine

## 2018-03-08 DIAGNOSIS — Z79899 Other long term (current) drug therapy: Secondary | ICD-10-CM | POA: Insufficient documentation

## 2018-03-08 DIAGNOSIS — M545 Low back pain, unspecified: Secondary | ICD-10-CM

## 2018-03-08 DIAGNOSIS — M62830 Muscle spasm of back: Secondary | ICD-10-CM | POA: Insufficient documentation

## 2018-03-08 MED ORDER — LIDOCAINE 5 % EX PTCH
1.0000 | MEDICATED_PATCH | Freq: Once | CUTANEOUS | Status: DC
  Administered 2018-03-08: 1 via TRANSDERMAL
  Filled 2018-03-08: qty 1

## 2018-03-08 MED ORDER — METHOCARBAMOL 500 MG PO TABS: 500.0000 mg | ORAL_TABLET | Freq: Two times a day (BID) | ORAL | 0 refills | Status: DC

## 2018-03-08 MED ORDER — KETOROLAC TROMETHAMINE 15 MG/ML IJ SOLN
15.0000 mg | Freq: Once | INTRAMUSCULAR | Status: AC
  Administered 2018-03-08: 15 mg via INTRAMUSCULAR
  Filled 2018-03-08: qty 1

## 2018-03-08 MED ORDER — LIDOCAINE 5 % EX PTCH: 1.0000 | MEDICATED_PATCH | CUTANEOUS | 0 refills | Status: DC

## 2018-03-08 NOTE — ED Provider Notes (Signed)
[facility] EMERGENCY DEPARTMENT Provider Note   CSN: [id number] Arrival date & time: 03/08/18  4098     History   Chief Complaint Chief Complaint  Patient presents with  . Back Pain    HPI Jordan [name] is a 42 y.o. male presenting for 3 weeks of left-sided back pain.  Patient states that pain has been constant for the past 3 weeks after beginning gradually after work 1 day.  Patient denies injury, fall or trauma.  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  Patient describes his pain as a left-sided throbbing pain that is moderate in intensity and worsened with certain movements such as turning or palpation of the left side of his back.  Patient denies radiation of pain.  Patient states that he has been using Tylenol and ibuprofen for his pain with minimal relief.  Patient denies history of fever, numbness/tingling or weak saddle area paresthesias, bowel/bladder incontinence, IV drug use or cancer.  Patient denies dysuria hematuria or urinary frequency.  HPI  History reviewed. No pertinent past medical history.  There are no active problems to display for this patient.   History reviewed. No pertinent surgical history.      Home Medications    Prior to Admission medications   Medication Sig Start Date End Date Taking? Authorizing Provider  acetaminophen (TYLENOL) 500 MG tablet Take 500 mg by mouth every 6 (six) hours as needed for mild pain.    [provider]  cetirizine (ZYRTEC ALLERGY) 10 MG tablet Take 1 tablet (10 mg total) by mouth daily. 05/29/17   Wallis Bamberg, PA-C  ibuprofen (ADVIL,MOTRIN) 800 MG tablet Take 800 mg by mouth every 8 (eight) hours as needed for mild pain.    [provider]  lidocaine (LIDODERM) 5 % Place 1 patch onto the skin daily. Remove & Discard patch within 12 hours or as directed by MD 03/08/18   Bill Salinas, PA-C    methocarbamol (ROBAXIN) 500 MG tablet Take 1 tablet (500 mg total) by mouth 2 (two) times daily. 03/08/18   Harlene Salts A, PA-C  ondansetron (ZOFRAN-ODT) 8 MG disintegrating tablet Take 1 tablet (8 mg total) by mouth every 8 (eight) hours as needed for nausea. 05/29/17   Wallis Bamberg, PA-C  predniSONE (DELTASONE) 50 MG tablet Take 1 tablet (50 mg total) by mouth daily. 04/21/17   Lawyer, Cristal Deer, PA-C  pseudoephedrine (SUDAFED) 60 MG tablet Take 1 tablet (60 mg total) by mouth every 8 (eight) hours as needed for congestion. 05/29/17   Wallis Bamberg, PA-C  pyridOXINE (VITAMIN B-6) 100 MG tablet Take 100 mg by mouth daily.    [provider]    Family History No family history on file.  Social History Social History   Tobacco Use  . Smoking status: Never Smoker  . Smokeless tobacco: Never Used  Substance Use Topics  . Alcohol use: Yes    Comment: occ  . Drug use: No     Allergies   Patient has no known allergies.   Review of Systems Review of Systems  Constitutional: Negative.  Negative for chills and fever.  Gastrointestinal: Negative.  Negative for abdominal pain, diarrhea, nausea and vomiting.  Genitourinary: Negative for dysuria and hematuria.  Musculoskeletal: Positive for back pain. Negative for neck pain.  Neurological: Negative.  Negative for weakness, numbness and headaches.  Denies saddle area paresthesias Denies bowel/bladder incontinence   Physical Exam Updated Vital Signs BP (!) 130/95 (BP Location: Right Arm)   Pulse (!) 55   Temp 97.9 F (36.6 C) (Oral)   Resp 15   Ht 5\' 9"  (1.753 m)   Wt 82.6 kg   SpO2 100%   BMI 26.88 kg/m   Physical Exam  Constitutional: He appears well-developed and well-nourished. No distress.  HENT:  Head: Normocephalic and atraumatic.  Right Ear: External ear normal.  Left Ear: External ear normal.  Nose: Nose normal.  Eyes: Pupils are equal, round, and reactive to light. EOM are normal.  Neck: Trachea  normal, normal range of motion, full passive range of motion without pain and phonation normal. Neck supple. No tracheal deviation present.  Cardiovascular:  Pulses:      Dorsalis pedis pulses are 2+ on the right side, and 2+ on the left side.       Posterior tibial pulses are 2+ on the right side, and 2+ on the left side.  Pulmonary/Chest: Effort normal. No respiratory distress.  Abdominal: Soft. There is no tenderness. There is no rebound and no guarding.  Musculoskeletal: Normal range of motion.       Right hip: Normal.       Left hip: Normal.       Cervical back: Normal.       Thoracic back: Normal. He exhibits normal range of motion, no bony tenderness, no swelling and no deformity.       Lumbar back: He exhibits tenderness and spasm. He exhibits normal range of motion, no bony tenderness, no swelling and no deformity.       Back:  Muscular spasms noted to left lumbar musculature.  No midline spinal tenderness to palpation.  No spinal step-off crepitus or deformity noted.  Hips stable and without bony tenderness.  Feet:  Right Foot:  Protective Sensation: 3 sites tested. 3 sites sensed.  Left Foot:  Protective Sensation: 3 sites tested. 3 sites sensed.  Neurological: He is alert. No sensory deficit. GCS eye subscore is 4. GCS verbal subscore is 5. GCS motor subscore is 6.  Speech is clear and goal oriented, follows commands Major Cranial nerves without deficit, no facial droop Normal strength in upper and lower extremities bilaterally including dorsiflexion and plantar flexion, strong and equal grip strength Sensation normal to light touch Moves extremities without ataxia, coordination intact Normal gait  Skin: Skin is warm and dry. Capillary refill takes less than 2 seconds.  Psychiatric: He has a normal mood and affect. His behavior is normal.   ED Treatments / Results  Labs (all labs ordered are listed, but only abnormal results are displayed) Labs Reviewed - No data to  display  EKG None  Radiology No results found.  Procedures Procedures (including critical care time)  Medications Ordered in ED Medications  lidocaine (LIDODERM) 5 % 1 patch (1 patch Transdermal Patch Applied 03/08/18 0624)  ketorolac (TORADOL) 15 MG/ML injection 15 mg (15 mg Intramuscular Given 03/08/18 1324)     Initial Impression / Assessment and Plan / ED Course  I have reviewed the triage vital signs and the nursing notes.  Pertinent labs & imaging results that were available during my care of the patient were reviewed by me and considered in my medical decision making (see chart for details).  Clinical Course as of Mar 09 643  Sat Mar 08, 2018  4010 Discussed with Dr. Erma Heritage, agrees with toradol, lidoderm, robaxin and discharge   [  BM]    Clinical Course User Index [BM] Bill Salinas, PA-C   Patient presenting with Left sided back pain.   No neurological deficits and normal neuro exam.  Patient can walk but states is painful.  Patient denies loss of bowel/bladder control or saddle area paresthesias.  No concern for cauda equina.  No fever, night sweats, weight loss, h/o cancer, or IVDU. RICE protocol and pain medicine indicated and discussed with patient.   Patient without trauma or signs of injury.  No bony tenderness on examination.  Imaging not indicated at this time.  Ketorolac 15 mg IM given and patient endorses some relief with medication. Patient denies history of CKD or gastric ulcers/bleeding. Robaxin 500mg  BID prescribed. Patient informed to avoid driving or operating heavy machinery while taking muscle relaxer. Patient provided with Lidoderm patch for comfort.  At this time there does not appear to be any evidence of an acute emergency medical condition and the patient appears stable for discharge with appropriate outpatient follow up. Diagnosis was discussed with patient who verbalizes understanding of care plan and is agreeable to discharge. I have  discussed return precautions with patient who verbalizes understanding of return precautions. Patient strongly encouraged to follow-up with their PCP. All questions answered.  Patient's case discussed with Dr. Erma Heritage who agrees with plan to discharge with follow-up.    Note: Portions of this report may have been transcribed using voice recognition software. Every effort was made to ensure accuracy; however, inadvertent computerized transcription errors may still be present.  Final Clinical Impressions(s) / ED Diagnoses   Final diagnoses:  Acute left-sided low back pain without sciatica  Muscle spasm of back    ED Discharge Orders         Ordered    methocarbamol (ROBAXIN) 500 MG tablet  2 times daily     03/08/18 0641    lidocaine (LIDODERM) 5 %  Every 24 hours     03/08/18 0641           Bill Salinas, PA-C 03/08/18 1610    Shaune Pollack, MD 03/08/18 [phone]

## 2018-03-08 NOTE — ED Notes (Signed)
Patient verbalizes understanding of medications and discharge instructions. No further questions at this time. VSS and patient ambulatory at discharge.   

## 2018-03-08 NOTE — ED Triage Notes (Signed)
Pt reports left sided back pain X3 weeks, OTC and home remedies have not relieved the pain.  Pt has physical job.

## 2018-03-08 NOTE — Discharge Instructions (Addendum)
Please return to the Emergency Department for any new or worsening symptoms or if your symptoms do not improve. Please be sure to follow up with your Primary Care Physician as soon as possible regarding your visit today. If you do not have a Primary Doctor please use the resources below to establish one. You may use the muscle relaxer Robaxin as prescribed.  Please do not drive or operate heavy machinery while taking this medication because it will make you drowsy. You may use the Lidoderm patch as prescribed on your left-sided back to help with pain. Please use heating pads and stretching to help with your muscle spasm.  Contact a health care provider if: You have pain that is not relieved with rest or medicine. You have increasing pain going down into your legs or buttocks. Your pain does not improve in 2 weeks. You have pain at night. You lose weight. You have a fever or chills. Get help right away if: You develop new bowel or bladder control problems. You have unusual weakness or numbness in your arms or legs. You develop nausea or vomiting. You develop abdominal pain. You feel faint.  Do not take your medicine if  develop an itchy rash, swelling in your mouth or lips, or difficulty breathing.   RESOURCE GUIDE  Chronic Pain Problems: Contact [facility]  [phone] Patients need to be referred by their primary care doctor.  Insufficient Money for Medicine: Contact United Way:  call "211" or [facility] 254-617-0096.  No Primary Care Doctor: Call [facility] Connect  [phone] - can help you locate a primary care doctor that  accepts your insurance, provides certain services, etc. Physician Referral Service- [phone]  Agencies that provide inexpensive medical care: [facility]  [phone] [facility]  [phone] [facility]  [phone] [facility]  [phone] Planned [facility]  [phone] [facility]  Cl[facility]  [phone]  Medicaid-accepting [facility] Providers: [facility]- [address]  [phone], Mon-Fri 9am-7pm, Sat 9am-1pm [facility]- [address] [place], Suite [place]  [phone] [facility]- [address] [place]  [phone] [facility]- [address]  [phone] Renaye Rakers- [address] [place], [address], [phone]  Only accepts [place] patients after they have their name  applied to their card  Self Pay (no insurance) in [facility]: Sickle Cell Patients: Dr Willey Blade, [facility]  [address] [place], [phone] [facility]- [address] [place]  [phone]       [facility] [place]- [address]       -     [facility]- see information above (Speak to [employer] if you do not have insurance)       -  [facility]- [address] [place], [phone]       -  [facility]- [address],  [phone]       -  [facility]- [address], [phone]       -  Dr Julio Sicks-  [address], [place], [phone]       -  [facility]- [address] [phone]       -  [facility]- [address] [phone], also [address]  [address] [phone]       -    [facility]- [address] [place], [phone], 1st & 3rd Saturday   every month, 10am-1pm  1) Find a Doctor and Pay Out of Pocket Although you won't have to find out who is covered by your insurance plan, it is a good idea to ask around and get recommendations. You will then need to call the office and see if the doctor you have chosen will accept you as a new patient and what types of options they offer for patients who are self-pay. Some doctors offer discounts or will set up payment plans for their patients who do not have insurance, but you will need to ask so you aren't  surprised when you get to your appointment.  2) Contact Your Local Health Department Not all health departments have doctors that can see patients for sick visits, but many do, so it is worth a call to see if yours does. If you don't know where your local health department is, you can check in your phone book. The CDC also has a tool to help you locate your state's health department, and many state websites also have listings of all of their local health departments.  3) Find a Walk-in Clinic If your illness is not likely to be very severe or complicated, you may want to try a walk in clinic. These are popping up all over the country in pharmacies, drugstores, and shopping centers. They're usually staffed by nurse practitioners or physician assistants that have been trained to treat common illnesses and complaints. They're usually fairly quick and inexpensive. However, if you have serious medical issues or chronic medical problems, these are probably not your best option  STD Testing [facility] Department of [facility], STD Clinic, [address] [place], phone [phone] or [phone].  Monday - Friday, call for an appointment. [facility] Department of [employer], STD Clinic, [place] [address], [place], phone [phone] or [phone].  Monday - Friday, call for an appointment.  Abuse/Neglect: [facility] Child Abuse Hotline 802 755 5876 [facility] Child Abuse Hotline 620-543-0656 (After Hours)  Emergency Shelter:  [facility] 947-156-4044  Maternity Homes: [facility] [place] [facility] 6715220404 [facility] [phone]  MRSA Hotline #:   (716)732-4081  [facility] Resources  [facility]  United Way [facility] Dept. [address]                 [address]         [address] [place] [address]  [place]                                                Cristobal Goldmann Phone:  [phone]                                  Phone:  [phone]                   Phone:  [phone]  [facility]  Mental Health, [phone] [facility]- [phone]       -     [facility] in [place], [address],                                  [phone], [facility] Child Abuse Hotline (502)207-2778 or 262-371-7388 (After Hours)   Behavioral Health Services  Substance Abuse Resources: Alcohol and Drug Services  717-245-5022 [facility] 902-253-9812 [facility] [place] 778-301-7766 Floydene Flock 820-351-7540 Residential & Outpatient Substance Abuse Program  402-111-4407  Psychological Services: [facility]  [phone] [facility] Services  [phone] [facility], [phone] [place]. [address] [place], ACCESS LINE: 2086300038 or 667-654-9533, [employer]  Dental Assistance  If unable to pay or uninsured, contact:  [facility] or [facility]. to become qualified for the adult dental clinic.  Patients with Medicaid: [facility] [phone] [address], [phone] [address], [phone]  If unable to pay, or uninsured, contact [facility] 914-749-3528) or [facility] Department [phone] in [place], [phone] in [facility]) to become qualified for the adult dental clinic   Other Low-Cost Community Dental Services: [facility]- [address] [place], [place], [place], [address], [phone], [address], 2nd and 4th Thursday of the month at 6:30am.  10 clients each day by appointment, can sometimes see walk-in patients if someone does not show for an appointment. [facility]- [address] Griffins [place], [place], [address], [phone] [facility] [address] [place], [place],  [address], [phone] [facility] Health Department- [phone] [facility] Health Department- [phone] [facility] [phone]

## 2018-05-24 ENCOUNTER — Emergency Department (HOSPITAL_COMMUNITY): Payer: Self-pay

## 2018-05-24 ENCOUNTER — Other Ambulatory Visit: Payer: Self-pay

## 2018-05-24 ENCOUNTER — Emergency Department (HOSPITAL_COMMUNITY)
Admission: EM | Admit: 2018-05-24 | Discharge: 2018-05-24 | Disposition: A | Discharge status: Home / Self Care | Payer: Self-pay | Attending: Emergency Medicine | Admitting: Emergency Medicine

## 2018-05-24 DIAGNOSIS — J209 Acute bronchitis, unspecified: Secondary | ICD-10-CM | POA: Insufficient documentation

## 2018-05-24 DIAGNOSIS — Z79899 Other long term (current) drug therapy: Secondary | ICD-10-CM | POA: Insufficient documentation

## 2018-05-24 DIAGNOSIS — J40 Bronchitis, not specified as acute or chronic: Secondary | ICD-10-CM

## 2018-05-24 MED ORDER — PREDNISONE 20 MG PO TABS: 40.0000 mg | ORAL_TABLET | Freq: Every day | ORAL | 0 refills | Status: DC

## 2018-05-24 MED ORDER — ALBUTEROL SULFATE (2.5 MG/3ML) 0.083% IN NEBU
5.0000 mg | INHALATION_SOLUTION | Freq: Once | RESPIRATORY_TRACT | Status: AC
  Administered 2018-05-24: 5 mg via RESPIRATORY_TRACT
  Filled 2018-05-24: qty 6

## 2018-05-24 MED ORDER — ALBUTEROL SULFATE HFA 108 (90 BASE) MCG/ACT IN AERS
2.0000 | INHALATION_SPRAY | RESPIRATORY_TRACT | Status: DC | PRN
  Administered 2018-05-24: 2 via RESPIRATORY_TRACT
  Filled 2018-05-24: qty 6.7

## 2018-05-24 MED ORDER — PREDNISONE 20 MG PO TABS
60.0000 mg | ORAL_TABLET | Freq: Once | ORAL | Status: AC
  Administered 2018-05-24: 60 mg via ORAL
  Filled 2018-05-24: qty 3

## 2018-05-24 NOTE — ED Notes (Signed)
ED Provider at bedside. 

## 2018-05-24 NOTE — ED Triage Notes (Signed)
Pt endorses dry cough x 2 weeks, has taken several otc meds without relief. Denies shob, bodyaches, chills or fevers. VSS

## 2018-05-24 NOTE — ED Notes (Signed)
Patient transported to X-ray 

## 2018-05-24 NOTE — ED Notes (Signed)
Patient verbalizes understanding of discharge instructions. Opportunity for questioning and answers were provided. Armband removed by staff, pt discharged from ED ambularory.

## 2018-05-24 NOTE — ED Provider Notes (Signed)
[facility] Provider Note   CSN: [id number] Arrival date & time: 05/24/18  1630     History   Chief Complaint Chief Complaint  Patient presents with  . Cough    HPI Jordan [name] is a 42 y.o. male.  Patient is a 42 year old male with no significant past medical history presenting today with 3 weeks of cough now.  He states initially started as a cold with nasal congestion, rhinorrhea, cough and sore throat.  He states after about a week those symptoms improved but then he had fever and chills for about 3 or 4 days.  However now he has had a persistent dry cough which seems to be worse at night but is just not getting better.  He is tried for 5 different over-the-counter medications that are not helping.  He has no history of asthma and he is not a smoker.  No vomiting, abdominal pain or chest pain.  He states his chest just feels sore in the morning when he wakes up after all the coughing.  The history is provided by the patient.  Cough  This is a new problem. Episode onset: 3 weeks. The problem occurs constantly. The problem has been gradually worsening. The cough is non-productive. Associated symptoms include rhinorrhea, myalgias and shortness of breath. Pertinent negatives include no headaches and no wheezing. He has tried decongestants and cough syrup for the symptoms. The treatment provided no relief. He is not a smoker. His past medical history does not include bronchitis, COPD or asthma.    No past medical history on file.  There are no active problems to display for this patient.   No past surgical history on file.      Home Medications    Prior to Admission medications   Medication Sig Start Date End Date Taking? Authorizing Provider  acetaminophen (TYLENOL) 500 MG tablet Take 500 mg by mouth every 6 (six) hours as needed for mild pain.    [provider]  cetirizine (ZYRTEC ALLERGY) 10 MG tablet Take 1 tablet (10 mg  total) by mouth daily. 05/29/17   Wallis BambergMani, Mario, PA-C  ibuprofen (ADVIL,MOTRIN) 800 MG tablet Take 800 mg by mouth every 8 (eight) hours as needed for mild pain.    [provider]  lidocaine (LIDODERM) 5 % Place 1 patch onto the skin daily. Remove & Discard patch within 12 hours or as directed by MD 03/08/18   Bill SalinasMorelli, Brandon A, PA-C  methocarbamol (ROBAXIN) 500 MG tablet Take 1 tablet (500 mg total) by mouth 2 (two) times daily. 03/08/18   Harlene SaltsMorelli, Brandon A, PA-C  ondansetron (ZOFRAN-ODT) 8 MG disintegrating tablet Take 1 tablet (8 mg total) by mouth every 8 (eight) hours as needed for nausea. 05/29/17   Wallis BambergMani, Mario, PA-C  predniSONE (DELTASONE) 50 MG tablet Take 1 tablet (50 mg total) by mouth daily. 04/21/17   Lawyer, Cristal Deerhristopher, PA-C  pseudoephedrine (SUDAFED) 60 MG tablet Take 1 tablet (60 mg total) by mouth every 8 (eight) hours as needed for congestion. 05/29/17   Wallis BambergMani, Mario, PA-C  pyridOXINE (VITAMIN B-6) 100 MG tablet Take 100 mg by mouth daily.    [provider]    Family History No family history on file.  Social History Social History   Tobacco Use  . Smoking status: Never Smoker  . Smokeless tobacco: Never Used  Substance Use Topics  . Alcohol use: Yes    Comment: occ  . Drug use: No  Allergies   Patient has no known allergies.   Review of Systems Review of Systems  HENT: Positive for rhinorrhea.   Respiratory: Positive for cough and shortness of breath. Negative for wheezing.   Musculoskeletal: Positive for myalgias.  Neurological: Negative for headaches.  All other systems reviewed and are negative.    Physical Exam Updated Vital Signs BP (!) 146/93 (BP Location: Right Arm)   Pulse 62   Temp 98.3 F (36.8 C) (Oral)   Resp 16   SpO2 98%   Physical Exam Vitals signs and nursing note reviewed.  Constitutional:      General: He is not in acute distress.    Appearance: He is well-developed.  HENT:     Head: Normocephalic and  atraumatic.     Right Ear: Tympanic membrane normal.     Left Ear: Tympanic membrane normal.     Nose: Nose normal. No congestion.     Mouth/Throat:     Mouth: Mucous membranes are moist.  Eyes:     Conjunctiva/sclera: Conjunctivae normal.     Pupils: Pupils are equal, round, and reactive to light.  Neck:     Musculoskeletal: Normal range of motion and neck supple.  Cardiovascular:     Rate and Rhythm: Normal rate and regular rhythm.     Heart sounds: No murmur.  Pulmonary:     Effort: Pulmonary effort is normal. No respiratory distress.     Breath sounds: Decreased breath sounds and wheezing present. No rales.  Abdominal:     General: There is no distension.     Palpations: Abdomen is soft.     Tenderness: There is no abdominal tenderness. There is no guarding or rebound.  Musculoskeletal: Normal range of motion.        General: No tenderness.  Skin:    General: Skin is warm and dry.     Findings: No erythema or rash.  Neurological:     Mental Status: He is alert and oriented to person, place, and time.  Psychiatric:        Behavior: Behavior normal.      ED Treatments / Results  Labs (all labs ordered are listed, but only abnormal results are displayed) Labs Reviewed - No data to display  EKG None  Radiology Dg Chest 2 View  Result Date: 05/24/2018 CLINICAL DATA:  Dry cough x2 weeks EXAM: CHEST - 2 VIEW COMPARISON:  02/02/2012 thoracic spine radiographs FINDINGS: The heart size and mediastinal contours are within normal limits. Both lungs are clear. The visualized skeletal structures are unremarkable. IMPRESSION: No active cardiopulmonary disease. Electronically Signed   By: Tollie Ethavid  Kwon M.D.   On: 05/24/2018 17:35    Procedures Procedures (including critical care time)  Medications Ordered in ED Medications  predniSONE (DELTASONE) tablet 60 mg (has no administration in time range)  albuterol (PROVENTIL HFA;VENTOLIN HFA) 108 (90 Base) MCG/ACT inhaler 2 puff  (has no administration in time range)  albuterol (PROVENTIL) (2.5 MG/3ML) 0.083% nebulizer solution 5 mg (5 mg Nebulization Given 05/24/18 1754)     Initial Impression / Assessment and Plan / ED Course  I have reviewed the triage vital signs and the nursing notes.  Pertinent labs & imaging results that were available during my care of the patient were reviewed by me and considered in my medical decision making (see chart for details).     Pt with symptoms consistent with viral URI which has now most likely become bronchitis.  Well appearing here.  No signs  of breathing difficulty but decreased breath sounds on exam.  No signs of pharyngitis, otitis or abnormal abdominal findings.   CXR wnl and after albuterol pt feeling a little better with slightly better air movement and more wheezing.  Pt given prednisone and albuterol.  O2 sat 98% on RA and no distress.  Final Clinical Impressions(s) / ED Diagnoses   Final diagnoses:  Bronchitis    ED Discharge Orders         Ordered    predniSONE (DELTASONE) 20 MG tablet  Daily     05/24/18 1834           Gwyneth Sprout, MD 05/24/18 1834

## 2018-06-01 ENCOUNTER — Other Ambulatory Visit: Payer: Self-pay

## 2018-06-01 ENCOUNTER — Emergency Department (HOSPITAL_COMMUNITY)
Admission: EM | Admit: 2018-06-01 | Discharge: 2018-06-01 | Disposition: A | Discharge status: Home / Self Care | Payer: Self-pay | Attending: Emergency Medicine | Admitting: Emergency Medicine

## 2018-06-01 ENCOUNTER — Encounter (HOSPITAL_COMMUNITY): Payer: Self-pay | Admitting: *Deleted

## 2018-06-01 DIAGNOSIS — R030 Elevated blood-pressure reading, without diagnosis of hypertension: Secondary | ICD-10-CM | POA: Insufficient documentation

## 2018-06-01 DIAGNOSIS — R05 Cough: Secondary | ICD-10-CM | POA: Insufficient documentation

## 2018-06-01 DIAGNOSIS — R059 Cough, unspecified: Secondary | ICD-10-CM

## 2018-06-01 DIAGNOSIS — Z79899 Other long term (current) drug therapy: Secondary | ICD-10-CM | POA: Insufficient documentation

## 2018-06-01 DIAGNOSIS — R0982 Postnasal drip: Secondary | ICD-10-CM | POA: Insufficient documentation

## 2018-06-01 MED ORDER — MONTELUKAST SODIUM 10 MG PO TABS: 10.0000 mg | ORAL_TABLET | Freq: Every day | ORAL | 0 refills | Status: DC

## 2018-06-01 MED ORDER — FLUTICASONE PROPIONATE 50 MCG/ACT NA SUSP: 1.0000 | Freq: Every day | NASAL | 2 refills | Status: DC

## 2018-06-01 MED ORDER — ALBUTEROL SULFATE HFA 108 (90 BASE) MCG/ACT IN AERS
1.0000 | INHALATION_SPRAY | Freq: Once | RESPIRATORY_TRACT | Status: AC
  Administered 2018-06-01: 1 via RESPIRATORY_TRACT
  Filled 2018-06-01: qty 6.7

## 2018-06-01 NOTE — ED Triage Notes (Signed)
Pt in c/o non productive cough worse at night, pt c/o sore throat onset yesterday, pt reports cough onset x 4 wks, pt reports seen here last week for the same symptoms, pt afebrile, A&O x4

## 2018-06-01 NOTE — ED Provider Notes (Signed)
[name]   CSN: [id number] Arrival date & time: 06/01/18  0630     History   Chief Complaint Chief Complaint  Patient presents with  . Cough    HPI Jordan FloodCharles [name] is a 43 y.o. male.  HPI  Patient is a 43 year old male with no significant past medical history presenting for persistent cough, and postnasal drip for 3 to 4 weeks.  Patient reports he was evaluated here 2 weeks ago and was diagnosed with upper respiratory infection, given prednisone, albuterol inhaler, however he reports that the cough continues.  He reports that he will wake up with a sore throat because he has a lot of drainage running down the back of his throat.  Denies any fevers at this interval.  Denies any productive cough or hemoptysis.  Patient denies any history of asthma or lung disease.  Patient is a non-smoker.  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  He denies any history of allergic rhinitis.  History reviewed. No pertinent past medical history.  There are no active problems to display for this patient.   History reviewed. No pertinent surgical history.      Home Medications    Prior to Admission medications   Medication Sig Start Date End Date Taking? Authorizing Provider  acetaminophen (TYLENOL) 500 MG tablet Take 500 mg by mouth every 6 (six) hours as needed for mild pain.    [provider]  cetirizine (ZYRTEC ALLERGY) 10 MG tablet Take 1 tablet (10 mg total) by mouth daily. 05/29/17   Wallis BambergMani, Mario, PA-C  ibuprofen (ADVIL,MOTRIN) 800 MG tablet Take 800 mg by mouth every 8 (eight) hours as needed for mild pain.    [provider]  lidocaine (LIDODERM) 5 % Place 1 patch onto the skin daily. Remove & Discard patch within 12 hours or as directed by MD 03/08/18   Bill SalinasMorelli, Brandon A, PA-C  methocarbamol (ROBAXIN) 500 MG tablet Take 1 tablet  (500 mg total) by mouth 2 (two) times daily. 03/08/18   Harlene SaltsMorelli, Brandon A, PA-C  ondansetron (ZOFRAN-ODT) 8 MG disintegrating tablet Take 1 tablet (8 mg total) by mouth every 8 (eight) hours as needed for nausea. 05/29/17   Wallis BambergMani, Mario, PA-C  predniSONE (DELTASONE) 20 MG tablet Take 2 tablets (40 mg total) by mouth daily. 05/24/18   Gwyneth SproutPlunkett, Whitney, MD  pseudoephedrine (SUDAFED) 60 MG tablet Take 1 tablet (60 mg total) by mouth every 8 (eight) hours as needed for congestion. 05/29/17   Wallis BambergMani, Mario, PA-C  pyridOXINE (VITAMIN B-6) 100 MG tablet Take 100 mg by mouth daily.    [provider]    Family History No family history on file.  Social History Social History   Tobacco Use  . Smoking status: Never Smoker  . Smokeless tobacco: Never Used  Substance Use Topics  . Alcohol use: Yes    Comment: occ  . Drug use: No     Allergies   Patient has no known allergies.   Review of Systems Review of Systems  Constitutional: Negative for chills and fever.  HENT: Positive for postnasal drip and rhinorrhea. Negative for congestion.   Respiratory: Positive for cough. Negative for chest tightness and wheezing.   Gastrointestinal: Positive for nausea.     Physical Exam Updated Vital Signs BP (!) 158/115 (BP Location: Right Arm)   Pulse (!) 59   Temp 98  F (36.7 C) (Oral)   Resp 18   Ht 5\' 9"  (1.753 m)   Wt 81.6 kg   SpO2 98%   BMI 26.58 kg/m   Physical Exam Vitals signs and nursing note reviewed.  Constitutional:      General: He is not in acute distress.    Appearance: He is well-developed. He is not diaphoretic.     Comments: Sitting comfortably in bed.  HENT:     Head: Normocephalic and atraumatic.     Right Ear: Tympanic membrane normal.     Left Ear: Tympanic membrane normal.     Nose:     Comments: Patient has erythematous inferior turbinates.    Mouth/Throat:     Comments: Cobblestoning of posterior pharynx.  No exudate. Eyes:     General:        Right  eye: No discharge.        Left eye: No discharge.     Conjunctiva/sclera: Conjunctivae normal.     Comments: EOMs normal to gross examination.  Neck:     Musculoskeletal: Normal range of motion.  Cardiovascular:     Rate and Rhythm: Normal rate and regular rhythm.     Heart sounds: Normal heart sounds. No murmur.  Pulmonary:     Effort: Pulmonary effort is normal.     Breath sounds: Normal breath sounds. No wheezing, rhonchi or rales.  Abdominal:     General: There is no distension.  Musculoskeletal: Normal range of motion.  Skin:    General: Skin is warm and dry.  Neurological:     Mental Status: He is alert.     Comments: Cranial nerves intact to gross observation. Patient moves extremities without difficulty.  Psychiatric:        Behavior: Behavior normal.        Thought Content: Thought content normal.        Judgment: Judgment normal.      ED Treatments / Results  Labs (all labs ordered are listed, but only abnormal results are displayed) Labs Reviewed - No data to display  EKG None  Radiology No results found.  Procedures Procedures (including critical care time)  Medications Ordered in ED Medications  albuterol (PROVENTIL HFA;VENTOLIN HFA) 108 (90 Base) MCG/ACT inhaler 1 puff (1 puff Inhalation Given 06/01/18 0813)     Initial Impression / Assessment and Plan / ED Course  I have reviewed the triage vital signs and the nursing notes.  Pertinent labs & imaging results that were available during my care of the patient were reviewed by me and considered in my medical decision making (see chart for details).     Patient nontoxic-appearing, afebrile, and in no acute distress.  Differentialincludes reactive airway disease and allergic rhinitis, postnasal drip, continued bronchitis.  Patient has normal lung exam.  Do not feel that chest x-ray needs to be repeated.  He had normal chest x-ray 2 weeks ago.  Question if patient is experiencing chronic cough due to  postnasal drip.  Will start Flonase and montelukast.  Scusset etiology with the patient.  Return precautions were given for any fevers or chills, worsening productive cough, or new or worsening symptoms.  Patient is in understanding and agrees with the plan of care.  Elevated blood pressure noted.  Resources given for primary care clinics within [facility] health system.  Final Clinical Impressions(s) / ED Diagnoses   Final diagnoses:  Cough  Postnasal drip  Elevated blood pressure reading without diagnosis of hypertension  ED Discharge Orders         Ordered    montelukast (SINGULAIR) 10 MG tablet  Daily at bedtime     06/01/18 0859    fluticasone (FLONASE) 50 MCG/ACT nasal spray  Daily     06/01/18 0859           Elisha Ponder, PA-C 06/01/18 0900    Gwyneth Sprout, MD 06/02/18 2225

## 2018-06-01 NOTE — ED Notes (Signed)
Patient verbalized understanding of discharge instructions and denies any further needs or questions at this time. VS stable. Patient ambulatory with steady gait.  

## 2018-06-01 NOTE — Discharge Instructions (Signed)
Please read and follow all provided instructions.  Your diagnoses today include:  1. Cough   2. Postnasal drip   3. Elevated blood pressure reading without diagnosis of hypertension     Tests performed today include: Vital signs. See below for your results today.   Medications prescribed:   Please take montelukast, 10 mg at night.  Home care instructions:  Follow any educational materials contained in this packet.   Your illness is contagious and can be spread to others, especially during the first 3 or 4 days. It cannot be cured by antibiotics or other medicines. Take basic precautions such as washing your hands often, covering your mouth when you cough or sneeze, and avoiding public places where you could spread your illness to others.   Please continue drinking plenty of fluids.  Use over-the-counter medicines as needed as directed on packaging for symptom relief.  You may also use ibuprofen or tylenol as directed on packaging for pain or fever.  Do not take multiple medicines containing Tylenol or acetaminophen to avoid taking too much of this medication.  Follow-up instructions: Please follow-up with your primary care provider in the next 3 days for further evaluation of your symptoms if you are not feeling better.   Return instructions:  Please return to the Emergency Department if you experience worsening symptoms.  RETURN IMMEDIATELY IF you develop shortness of breath, chest pain, confusion or altered mental status, a new rash, become dizzy, faint, or poorly responsive, or are unable to be cared for at home. Please return if you have persistent vomiting and cannot keep down fluids or develop a fever that is not controlled by tylenol or motrin.   Please return if you have any other emergent concerns.  Additional Information:  Your vital signs today were: BP (!) 158/115 (BP Location: Right Arm)    Pulse (!) 59    Temp 98 F (36.7 C) (Oral)    Resp 18    Ht 5\' 9"  (1.753 m)     Wt 81.6 kg    SpO2 98%    BMI 26.58 kg/m  If your blood pressure (BP) was elevated above 135/85 this visit, please have this repeated by your doctor within one month. --------------

## 2018-06-08 ENCOUNTER — Emergency Department (HOSPITAL_COMMUNITY): Payer: Self-pay

## 2018-06-08 ENCOUNTER — Encounter (HOSPITAL_COMMUNITY): Payer: Self-pay | Admitting: Emergency Medicine

## 2018-06-08 ENCOUNTER — Other Ambulatory Visit: Payer: Self-pay

## 2018-06-08 ENCOUNTER — Emergency Department (HOSPITAL_COMMUNITY)
Admission: EM | Admit: 2018-06-08 | Discharge: 2018-06-08 | Disposition: A | Discharge status: Home / Self Care | Payer: Self-pay | Attending: Emergency Medicine | Admitting: Emergency Medicine

## 2018-06-08 DIAGNOSIS — H579 Unspecified disorder of eye and adnexa: Secondary | ICD-10-CM | POA: Insufficient documentation

## 2018-06-08 DIAGNOSIS — R05 Cough: Secondary | ICD-10-CM | POA: Insufficient documentation

## 2018-06-08 DIAGNOSIS — R0981 Nasal congestion: Secondary | ICD-10-CM | POA: Insufficient documentation

## 2018-06-08 DIAGNOSIS — R059 Cough, unspecified: Secondary | ICD-10-CM

## 2018-06-08 DIAGNOSIS — R5383 Other fatigue: Secondary | ICD-10-CM | POA: Insufficient documentation

## 2018-06-08 MED ORDER — BENZONATATE 100 MG PO CAPS: 100.0000 mg | ORAL_CAPSULE | Freq: Three times a day (TID) | ORAL | 0 refills | Status: DC

## 2018-06-08 MED ORDER — AZITHROMYCIN 250 MG PO TABS: ORAL_TABLET | ORAL | 0 refills | Status: DC

## 2018-06-08 NOTE — ED Notes (Signed)
Pt stable, ambulatory, states understanding of discharge instructions 

## 2018-06-08 NOTE — ED Triage Notes (Signed)
Pt.l STATED, THIS IS MY 4TH TRIP HERE AND IM STILL HAVING A COUGH.

## 2018-06-08 NOTE — ED Triage Notes (Signed)
This started a month ago

## 2018-06-08 NOTE — ED Provider Notes (Signed)
[facility] Emergency Department Provider Note MRN:  [id number]  Arrival date & time: 06/08/18     Chief Complaint   Cough and Nasal Congestion   History of Present Illness   Jordan [name] is a 43 y.o. year-old male with no pertinent past medical history presents to the ED with chief complaint of cough.  1 month of persistent cough and nasal congestion.  Has been seen in the ED twice prior to today.  Did not improve with steroids, felt that the montelukast medication was helping quite a bit, was almost all the way better 2 days ago, but symptoms recurred yesterday and today.  Persistent dry cough, denies shortness of breath, no chest pain, no fever, no neck pain, mild sore throat, no abdominal pain, endorsing general malaise and low energy, no body aches.  Review of Systems  A complete 10 system review of systems was obtained and all systems are negative except as noted in the HPI and PMH.   Patient's Health History   History reviewed. No pertinent past medical history.  History reviewed. No pertinent surgical history.  No family history on file.  Social History   Socioeconomic History  . Marital status: Married    Spouse name: Not on file  . Number of children: Not on file  . Years of education: Not on file  . Highest education level: Not on file  Occupational History  . Not on file  Social Needs  . Financial resource strain: Not on file  . Food insecurity:    Worry: Not on file    Inability: Not on file  . Transportation needs:    Medical: Not on file    Non-medical: Not on file  Tobacco Use  . Smoking status: Never Smoker  . Smokeless tobacco: Never Used  Substance and Sexual Activity  . Alcohol use: Yes    Comment: occ  . Drug use: No  . Sexual activity: Not on file  Lifestyle  . Physical activity:    Days per week: Not on file    Minutes per session: Not on file  . Stress: Not on file  Relationships  . Social connections:    Talks on phone:  Not on file    Gets together: Not on file    Attends religious service: Not on file    Active member of club or organization: Not on file    Attends meetings of clubs or organizations: Not on file    Relationship status: Not on file  . Intimate partner violence:    Fear of current or ex partner: Not on file    Emotionally abused: Not on file    Physically abused: Not on file    Forced sexual activity: Not on file  Other Topics Concern  . Not on file  Social History Narrative  . Not on file     Physical Exam  Vital Signs and Nursing Notes reviewed Vitals:   06/08/18 1325  BP: (!) 151/116  Pulse: 93  Resp: 16  Temp: 100.2 F (37.9 C)  SpO2: 98%    CONSTITUTIONAL: Well-appearing, NAD NEURO:  Alert and oriented x 3, no focal deficits EYES:  eyes equal and reactive ENT/NECK:  no LAD, no JVD CARDIO: Regular rate, well-perfused, normal S1 and S2 PULM:  CTAB no wheezing or rhonchi GI/GU:  normal bowel sounds, non-distended, non-tender MSK/SPINE:  No gross deformities, no edema SKIN:  no rash, atraumatic PSYCH:  Appropriate speech and behavior  Diagnostic and  Interventional Summary    Labs Reviewed - No data to display  DG Chest 2 View  Final Result      Medications - No data to display   Procedures Critical Care  ED Course and Medical Decision Making  I have reviewed the triage vital signs and the nursing notes.  Pertinent labs & imaging results that were available during my care of the patient were reviewed by me and considered in my medical decision making (see below for details).  Suspect viral illness versus secondary bacterial pneumonia versus cough related to seasonal allergies.  Patient has no risk factors for TB, seems inconsistent with cough related to GERD.  X-ray pending.  X-ray unremarkable.  Upon further questioning, patient did have a prodrome of nasal congestion, low energy, general malaise, eye redness.  Raising some consideration of whooping cough.   Patient does endorse coughing spells.  Will treat with Z-Pak, Tessalon.  After the discussed management above, the patient was determined to be safe for discharge.  The patient was in agreement with this plan and all questions regarding their care were answered.  ED return precautions were discussed and the patient will return to the ED with any significant worsening of condition.  Elmer Sow. Pilar Plate, MD [facility] Emergency Medicine [facility] [email or web address]  Final Clinical Impressions(s) / ED Diagnoses     ICD-10-CM   1. Cough R05 DG Chest 2 View    DG Chest 2 View    ED Discharge Orders         Ordered    azithromycin (ZITHROMAX) 250 MG tablet     06/08/18 1603    benzonatate (TESSALON) 100 MG capsule  Every 8 hours     06/08/18 1603            Sabas Sous, MD 06/08/18 1607

## 2018-06-08 NOTE — Discharge Instructions (Addendum)
You were evaluated in the Emergency Department and after careful evaluation, we did not find any emergent condition requiring admission or further testing in the hospital.  Please take the medications provided as directed and follow-up with your regular doctor.  We are treating you for possible whooping cough.  Please return to the Emergency Department if you experience any worsening of your condition.  We encourage you to follow up with a primary care provider.  Thank you for allowing [place] to be a part of your care.

## 2018-06-08 NOTE — ED Notes (Signed)
To x-ray

## 2018-12-26 DIAGNOSIS — J029 Acute pharyngitis, unspecified: Secondary | ICD-10-CM | POA: Diagnosis not present

## 2018-12-26 DIAGNOSIS — Z1159 Encounter for screening for other viral diseases: Secondary | ICD-10-CM | POA: Diagnosis not present

## 2018-12-29 DIAGNOSIS — R1032 Left lower quadrant pain: Secondary | ICD-10-CM | POA: Diagnosis not present

## 2018-12-31 ENCOUNTER — Ambulatory Visit (HOSPITAL_COMMUNITY)
Admission: EM | Admit: 2018-12-31 | Discharge: 2018-12-31 | Disposition: A | Discharge status: Home / Self Care | Payer: BC Managed Care – PPO | Attending: Urgent Care | Admitting: Urgent Care

## 2018-12-31 ENCOUNTER — Ambulatory Visit (INDEPENDENT_AMBULATORY_CARE_PROVIDER_SITE_OTHER): Payer: BC Managed Care – PPO

## 2018-12-31 ENCOUNTER — Other Ambulatory Visit: Payer: Self-pay

## 2018-12-31 ENCOUNTER — Encounter (HOSPITAL_COMMUNITY): Payer: Self-pay | Admitting: Emergency Medicine

## 2018-12-31 DIAGNOSIS — K219 Gastro-esophageal reflux disease without esophagitis: Secondary | ICD-10-CM

## 2018-12-31 DIAGNOSIS — I10 Essential (primary) hypertension: Secondary | ICD-10-CM | POA: Diagnosis not present

## 2018-12-31 DIAGNOSIS — M545 Low back pain: Secondary | ICD-10-CM | POA: Diagnosis not present

## 2018-12-31 DIAGNOSIS — M791 Myalgia, unspecified site: Secondary | ICD-10-CM

## 2018-12-31 DIAGNOSIS — M546 Pain in thoracic spine: Secondary | ICD-10-CM | POA: Diagnosis not present

## 2018-12-31 DIAGNOSIS — R110 Nausea: Secondary | ICD-10-CM

## 2018-12-31 DIAGNOSIS — R030 Elevated blood-pressure reading, without diagnosis of hypertension: Secondary | ICD-10-CM

## 2018-12-31 MED ORDER — PREDNISONE 20 MG PO TABS: ORAL_TABLET | ORAL | 0 refills | Status: DC

## 2018-12-31 MED ORDER — AMLODIPINE BESYLATE 5 MG PO TABS: 5.0000 mg | ORAL_TABLET | Freq: Every day | ORAL | 0 refills | Status: DC

## 2018-12-31 MED ORDER — CYCLOBENZAPRINE HCL 5 MG PO TABS: 5.0000 mg | ORAL_TABLET | Freq: Three times a day (TID) | ORAL | 0 refills | Status: DC | PRN

## 2018-12-31 NOTE — ED Provider Notes (Signed)
MRN: [id number] DOB: 1975/06/05  Subjective:   Jordan [name] is a 43 y.o. male presenting for 2 month history of worsening persistent left flank/thoracic/upper low back pain that is like a soreness in nature.  Occasionally, patient reports that the pain can radiate down into his left thigh.  Sx occur daily unless he's sitting. Once he starts moving starts to aggravate his pain. Tries rubbing his left flank but does not really help. Has tried aspirin, ibuprofen without any relief. Has also tried muscle rub and icing but does not help. Works in Retail buyer, drives a fork lift, also stands, does plenty of twisting and turning at the level of his torso.  Of note, patient has had similar episodes of pain in the past, had negative CT renal study November 2018.  Has generally had normal labs including normal creatinine, GFR and liver enzymes.  Patient admits that he drinks energy drinks regularly including prior to today's visit.  Denies history of hypertension and is not taking any medications for this.  Denies smoking cigarettes. Has occasional alcohol drink.   Denies taking any chronic medications.    No Known Allergies. Denies pmh and psh.   Family history is positive for hypertension.  ROS  Objective:   Vitals: BP (!) 162/94 (BP Location: Left Arm)   Pulse 73   Temp 98.3 F (36.8 C) (Oral)   Resp 18   SpO2 100%   BP Readings from Last 3 Encounters:  12/31/18 (!) 162/94  06/08/18 (!) 141/102  06/01/18 (!) 152/111   Physical Exam Constitutional:      General: He is not in acute distress.    Appearance: Normal appearance. He is well-developed. He is not ill-appearing, toxic-appearing or diaphoretic.  HENT:     Head: Normocephalic and atraumatic.     Right Ear: External ear normal.     Left Ear: External ear normal.     Nose: Nose normal.     Mouth/Throat:     Mouth: Mucous membranes are moist.     Pharynx: Oropharynx is clear.  Eyes:     General: No scleral icterus.  Extraocular Movements: Extraocular movements intact.     Pupils: Pupils are equal, round, and reactive to light.  Cardiovascular:     Rate and Rhythm: Normal rate and regular rhythm.     Heart sounds: Normal heart sounds. No murmur. No friction rub. No gallop.   Pulmonary:     Effort: Pulmonary effort is normal. No respiratory distress.     Breath sounds: Normal breath sounds. No stridor. No wheezing, rhonchi or rales.  Musculoskeletal:     Lumbar back: He exhibits tenderness. He exhibits normal range of motion, no bony tenderness, no swelling, no edema, no deformity and no spasm.       Back:  Skin:    General: Skin is warm and dry.  Neurological:     Mental Status: He is alert and oriented to person, place, and time.  Psychiatric:        Mood and Affect: Mood normal.        Behavior: Behavior normal.        Thought Content: Thought content normal.    Dg Lumbar Spine Complete  Result Date: 12/31/2018 CLINICAL DATA:  Left-sided low back pain for 8 weeks. No known injury. EXAM: LUMBAR SPINE - COMPLETE 4+ VIEW COMPARISON:  Thoracic spine radiographs 02/12/2012. Abdominal CT 04/21/2017. FINDINGS: Five lumbar type vertebral bodies with a transitional, partially sacralized L5 segment. There are bilateral lumbosacral  assimilation joints. The alignment is normal. There is stable mild disc space narrowing at L4-5. The additional disc spaces are preserved. No evidence of acute fracture or pars defect. Stable right pelvic calcification. IMPRESSION: Stable appearance of the lumbar spine without acute findings. Mild disc space narrowing at L4-5. Transitional L5 segment. Electronically Signed   By: Carey BullocksWilliam  Veazey M.D.   On: 12/31/2018 13:46    Assessment and Plan :   1. Acute left-sided thoracic back pain   2. Essential hypertension   3. Elevated blood pressure reading   4. Myalgia   5. Gastroesophageal reflux disease without esophagitis     Patient is to stop using energy drinks as often as he  is.  Recommended he hydrate with just plain water.  Will address his concerns for his severe back pain with short steroid course, muscle relaxant.  However I suspect that it is a combination of the nature of his work and persistent use of energy drinks.  We will have patient start amlodipine at low-dose of 5 mg to address his high blood pressure.  He is to [facility] care [facility] internal medicine for follow-up on his chronic back pain and blood pressure.   Wallis BambergMani, Lerline Valdivia, [place]-C 12/31/18 1534

## 2018-12-31 NOTE — ED Triage Notes (Signed)
Pt here for left sided back pain x 2 months

## 2019-01-11 ENCOUNTER — Other Ambulatory Visit: Payer: Self-pay

## 2019-01-11 ENCOUNTER — Ambulatory Visit (HOSPITAL_COMMUNITY)
Admission: EM | Admit: 2019-01-11 | Discharge: 2019-01-11 | Disposition: A | Discharge status: Home / Self Care | Payer: BC Managed Care – PPO | Attending: Urgent Care | Admitting: Urgent Care

## 2019-01-11 ENCOUNTER — Encounter (HOSPITAL_COMMUNITY): Payer: Self-pay | Admitting: Emergency Medicine

## 2019-01-11 DIAGNOSIS — Z20828 Contact with and (suspected) exposure to other viral communicable diseases: Secondary | ICD-10-CM | POA: Insufficient documentation

## 2019-01-11 DIAGNOSIS — R51 Headache: Secondary | ICD-10-CM | POA: Insufficient documentation

## 2019-01-11 DIAGNOSIS — R519 Headache, unspecified: Secondary | ICD-10-CM

## 2019-01-11 DIAGNOSIS — M5136 Other intervertebral disc degeneration, lumbar region: Secondary | ICD-10-CM | POA: Diagnosis not present

## 2019-01-11 DIAGNOSIS — M7918 Myalgia, other site: Secondary | ICD-10-CM | POA: Insufficient documentation

## 2019-01-11 DIAGNOSIS — R109 Unspecified abdominal pain: Secondary | ICD-10-CM | POA: Diagnosis not present

## 2019-01-11 DIAGNOSIS — M545 Low back pain, unspecified: Secondary | ICD-10-CM

## 2019-01-11 DIAGNOSIS — Z79899 Other long term (current) drug therapy: Secondary | ICD-10-CM | POA: Diagnosis not present

## 2019-01-11 DIAGNOSIS — R197 Diarrhea, unspecified: Secondary | ICD-10-CM

## 2019-01-11 LAB — POCT URINALYSIS DIP (DEVICE)
Bilirubin Urine: NEGATIVE
Glucose, UA: NEGATIVE mg/dL
Hgb urine dipstick: NEGATIVE
Ketones, ur: NEGATIVE mg/dL
Leukocytes,Ua: NEGATIVE
Nitrite: NEGATIVE
Protein, ur: NEGATIVE mg/dL
Specific Gravity, Urine: 1.01 (ref 1.005–1.030)
Urobilinogen, UA: 0.2 mg/dL (ref 0.0–1.0)
pH: 6 (ref 5.0–8.0)

## 2019-01-11 MED ORDER — MELOXICAM 7.5 MG PO TABS: 7.5000 mg | ORAL_TABLET | Freq: Every day | ORAL | 0 refills | Status: DC

## 2019-01-11 MED ORDER — LOPERAMIDE HCL 2 MG PO CAPS: 2.0000 mg | ORAL_CAPSULE | Freq: Every day | ORAL | 0 refills | Status: DC | PRN

## 2019-01-11 NOTE — ED Provider Notes (Signed)
MRN: [id number] DOB: 07/07/1975  Subjective:   Jordan [name] is a 43 y.o. male presenting for several day history of generalized headaches, mostly frontal.  Has also had loose stools/diarrhea daily, has about 5 bowel movements per day.  Of note, patient had an office visit with me on 12/31/2018, was advised to stop drinking energy drinks and start hydrating with water.  Patient reports that he has done so.  To address his persistent musculoskeletal back pain, I started patient on prednisone course and he was doing very well while he was on it.  Has also been using Flexeril.  However after finishing the prednisone, his left flank, left back pain returned.  It is not as severe but still persists.  Patient does continue to work.  He would not like to miss more time from work.  No known COVID-19 contacts.  He was tested about 2 weeks ago, results were negative.  He is not opposed to retesting today given his nonspecific generalized headaches, loose stools/diarrhea and musculoskeletal pain.  Of note, patient has had a CT scan in 2018, was negative for renal stones.  His last lumbar x-ray showed some degenerative disc disease at L4-L5.  He did reach out to the [facility] internal medicine and has an appointment scheduled this week for establishing care for PCP.  No current facility-administered medications for this encounter.   Current Outpatient Medications:  .  amLODipine (NORVASC) 5 MG tablet, Take 1 tablet (5 mg total) by mouth daily., Disp: 30 tablet, Rfl: 0   No Known Allergies  History reviewed. No pertinent past medical history.   History reviewed. No pertinent surgical history.  ROS  Objective:   Vitals: BP (!) 137/93   Pulse 86   Temp 97.7 F (36.5 C)   Resp 18   SpO2 100%   Physical Exam Constitutional:      General: He is not in acute distress.    Appearance: Normal appearance. He is well-developed and normal weight. He is not ill-appearing.  HENT:     Head: Normocephalic and  atraumatic.     Right Ear: Tympanic membrane, ear canal and external ear normal. There is no impacted cerumen.     Left Ear: Tympanic membrane, ear canal and external ear normal. There is no impacted cerumen.     Nose: Nose normal. No congestion or rhinorrhea.     Comments: Nasal turbinates boggy.    Mouth/Throat:     Mouth: Mucous membranes are moist.     Pharynx: No oropharyngeal exudate or posterior oropharyngeal erythema.     Comments: Mild postnasal drainage and cobblestone pattern. Eyes:     General: No scleral icterus.       Right eye: No discharge.        Left eye: No discharge.     Extraocular Movements: Extraocular movements intact.     Conjunctiva/sclera: Conjunctivae normal.     Pupils: Pupils are equal, round, and reactive to light.  Neck:     Musculoskeletal: Normal range of motion and neck supple. No neck rigidity or muscular tenderness.  Cardiovascular:     Rate and Rhythm: Normal rate.  Pulmonary:     Effort: Pulmonary effort is normal.  Musculoskeletal:     Lumbar back: He exhibits tenderness (Over area depicted albeit not as pronounced as his last office visit). He exhibits normal range of motion, no bony tenderness, no swelling, no edema, no deformity, no laceration, no pain and no spasm.  Back:  Neurological:     General: No focal deficit present.     Mental Status: He is alert and oriented to person, place, and time.     Cranial Nerves: No cranial nerve deficit.     Motor: No weakness.     Coordination: Coordination normal.     Gait: Gait normal.     Deep Tendon Reflexes: Reflexes normal.  Psychiatric:        Mood and Affect: Mood normal. Mood is not anxious or depressed.        Speech: Speech normal.        Behavior: Behavior normal. Behavior is not agitated.        Thought Content: Thought content normal.        Cognition and Memory: Cognition is not impaired. Memory is not impaired.        Judgment: Judgment normal.     Results for orders  placed or performed during the hospital encounter of 01/11/19 (from the past 24 hour(s))  POCT urinalysis dip (device)     Status: None   Collection Time: 01/11/19  5:27 PM  Result Value Ref Range   Glucose, UA NEGATIVE NEGATIVE mg/dL   Bilirubin Urine NEGATIVE NEGATIVE   Ketones, ur NEGATIVE NEGATIVE mg/dL   Specific Gravity, Urine 1.010 1.005 - 1.030   Hgb urine dipstick NEGATIVE NEGATIVE   pH 6.0 5.0 - 8.0   Protein, ur NEGATIVE NEGATIVE mg/dL   Urobilinogen, UA 0.2 0.0 - 1.0 mg/dL   Nitrite NEGATIVE NEGATIVE   Leukocytes,Ua NEGATIVE NEGATIVE    Assessment and Plan :   1. Generalized headaches   2. Diarrhea, unspecified type   3. Acute left-sided low back pain without sciatica   4. Left flank pain      We will have patient start meloxicam, maintain hydration.  Maintain amlodipine.  Will use loperamide for diarrhea/loose stools.  Recommended patient start Zyrtec to address possible allergic rhinitis for what I suspect are sinus headaches.  Physical exam findings reassuring.  Patient is to maintain appointment with new PCP this week.  COVID-19 testing pending, have low suspicion for this.  Counseled patient on potential for adverse effects with medications prescribed/recommended today, ER and return-to-clinic precautions discussed, patient verbalized understanding.    Jaynee Eagles, PA-C 01/11/19 1739

## 2019-01-11 NOTE — ED Triage Notes (Signed)
Pt c/o headache and pressure in the center of his face for the last few days, also states hes had diarrhea as well.

## 2019-01-12 ENCOUNTER — Ambulatory Visit: Payer: BC Managed Care – PPO | Admitting: Family Medicine

## 2019-01-12 LAB — NOVEL CORONAVIRUS, NAA (HOSP ORDER, SEND-OUT TO REF LAB; TAT 18-24 HRS): SARS-CoV-2, NAA: NOT DETECTED

## 2019-01-19 DIAGNOSIS — F411 Generalized anxiety disorder: Secondary | ICD-10-CM | POA: Diagnosis not present

## 2019-01-19 DIAGNOSIS — F4010 Social phobia, unspecified: Secondary | ICD-10-CM | POA: Diagnosis not present

## 2019-01-19 DIAGNOSIS — F341 Dysthymic disorder: Secondary | ICD-10-CM | POA: Diagnosis not present

## 2019-01-21 ENCOUNTER — Ambulatory Visit: Payer: BC Managed Care – PPO | Admitting: Family Medicine

## 2019-01-22 DIAGNOSIS — F4010 Social phobia, unspecified: Secondary | ICD-10-CM | POA: Diagnosis not present

## 2019-01-22 DIAGNOSIS — F411 Generalized anxiety disorder: Secondary | ICD-10-CM | POA: Diagnosis not present

## 2019-01-22 DIAGNOSIS — F341 Dysthymic disorder: Secondary | ICD-10-CM | POA: Diagnosis not present

## 2019-01-28 DIAGNOSIS — F411 Generalized anxiety disorder: Secondary | ICD-10-CM | POA: Diagnosis not present

## 2019-01-28 DIAGNOSIS — F341 Dysthymic disorder: Secondary | ICD-10-CM | POA: Diagnosis not present

## 2019-01-28 DIAGNOSIS — F4010 Social phobia, unspecified: Secondary | ICD-10-CM | POA: Diagnosis not present

## 2019-02-04 DIAGNOSIS — F411 Generalized anxiety disorder: Secondary | ICD-10-CM | POA: Diagnosis not present

## 2019-02-04 DIAGNOSIS — F4010 Social phobia, unspecified: Secondary | ICD-10-CM | POA: Diagnosis not present

## 2019-02-04 DIAGNOSIS — F341 Dysthymic disorder: Secondary | ICD-10-CM | POA: Diagnosis not present

## 2019-02-11 ENCOUNTER — Ambulatory Visit: Payer: BC Managed Care – PPO | Admitting: Family Medicine

## 2019-02-11 DIAGNOSIS — F411 Generalized anxiety disorder: Secondary | ICD-10-CM | POA: Diagnosis not present

## 2019-02-11 DIAGNOSIS — F341 Dysthymic disorder: Secondary | ICD-10-CM | POA: Diagnosis not present

## 2019-02-11 DIAGNOSIS — F4010 Social phobia, unspecified: Secondary | ICD-10-CM | POA: Diagnosis not present

## 2019-02-18 DIAGNOSIS — F341 Dysthymic disorder: Secondary | ICD-10-CM | POA: Diagnosis not present

## 2019-02-18 DIAGNOSIS — F4010 Social phobia, unspecified: Secondary | ICD-10-CM | POA: Diagnosis not present

## 2019-02-18 DIAGNOSIS — F411 Generalized anxiety disorder: Secondary | ICD-10-CM | POA: Diagnosis not present

## 2019-02-24 DIAGNOSIS — F4010 Social phobia, unspecified: Secondary | ICD-10-CM | POA: Diagnosis not present

## 2019-02-24 DIAGNOSIS — F411 Generalized anxiety disorder: Secondary | ICD-10-CM | POA: Diagnosis not present

## 2019-02-24 DIAGNOSIS — F341 Dysthymic disorder: Secondary | ICD-10-CM | POA: Diagnosis not present

## 2019-02-25 ENCOUNTER — Ambulatory Visit: Payer: BC Managed Care – PPO | Admitting: Family Medicine

## 2019-02-25 DIAGNOSIS — Z0289 Encounter for other administrative examinations: Secondary | ICD-10-CM

## 2019-03-04 ENCOUNTER — Other Ambulatory Visit: Payer: Self-pay

## 2019-03-04 ENCOUNTER — Encounter (HOSPITAL_COMMUNITY): Payer: Self-pay

## 2019-03-04 ENCOUNTER — Ambulatory Visit (HOSPITAL_COMMUNITY)
Admission: EM | Admit: 2019-03-04 | Discharge: 2019-03-04 | Disposition: A | Discharge status: Home / Self Care | Payer: BC Managed Care – PPO | Attending: Family Medicine | Admitting: Family Medicine

## 2019-03-04 DIAGNOSIS — F341 Dysthymic disorder: Secondary | ICD-10-CM | POA: Diagnosis not present

## 2019-03-04 DIAGNOSIS — Z20828 Contact with and (suspected) exposure to other viral communicable diseases: Secondary | ICD-10-CM

## 2019-03-04 DIAGNOSIS — J0100 Acute maxillary sinusitis, unspecified: Secondary | ICD-10-CM

## 2019-03-04 DIAGNOSIS — F411 Generalized anxiety disorder: Secondary | ICD-10-CM | POA: Diagnosis not present

## 2019-03-04 DIAGNOSIS — F4010 Social phobia, unspecified: Secondary | ICD-10-CM | POA: Diagnosis not present

## 2019-03-04 DIAGNOSIS — J3489 Other specified disorders of nose and nasal sinuses: Secondary | ICD-10-CM

## 2019-03-04 DIAGNOSIS — Z20822 Contact with and (suspected) exposure to covid-19: Secondary | ICD-10-CM

## 2019-03-04 MED ORDER — PREDNISONE 20 MG PO TABS: ORAL_TABLET | ORAL | 0 refills | Status: DC

## 2019-03-04 MED ORDER — AMOXICILLIN 875 MG PO TABS: 875.0000 mg | ORAL_TABLET | Freq: Two times a day (BID) | ORAL | 0 refills | Status: DC

## 2019-03-04 NOTE — ED Provider Notes (Signed)
  MRN: [id number] DOB: Sep 06, 1975  Subjective:   Jordan [name] is a 43 y.o. male presenting for 2 week history of acute onset and persistent sinus congestion, sinus pain, bilateral ear pain and popping. Has had COVID exposure at work. Of note, patient had flu shot last week.  Denies fever, chest pain, shortness of breath, wheezing.  Patient is not a smoker.  No current facility-administered medications for this encounter.   Current Outpatient Medications:  .  amLODipine (NORVASC) 5 MG tablet, Take 1 tablet (5 mg total) by mouth daily., Disp: 30 tablet, Rfl: 0 .  loperamide (IMODIUM) 2 MG capsule, Take 1 capsule (2 mg total) by mouth daily as needed for diarrhea or loose stools., Disp: 10 capsule, Rfl: 0 .  meloxicam (MOBIC) 7.5 MG tablet, Take 1 tablet (7.5 mg total) by mouth daily., Disp: 30 tablet, Rfl: 0   No Known Allergies  History reviewed. No pertinent past medical history.   History reviewed. No pertinent surgical history.  ROS  Objective:   Vitals: BP (!) 138/95 (BP Location: Left Arm)   Pulse 80   Temp 98.1 F (36.7 C) (Oral)   Resp 17   SpO2 100%   Physical Exam Constitutional:      General: He is not in acute distress.    Appearance: Normal appearance. He is well-developed. He is not ill-appearing, toxic-appearing or diaphoretic.  HENT:     Head: Normocephalic and atraumatic.     Right Ear: External ear normal.     Left Ear: External ear normal.     Nose: Nose normal.     Mouth/Throat:     Mouth: Mucous membranes are moist.     Pharynx: Oropharynx is clear.  Eyes:     General: No scleral icterus.    Extraocular Movements: Extraocular movements intact.     Pupils: Pupils are equal, round, and reactive to light.  Cardiovascular:     Rate and Rhythm: Normal rate and regular rhythm.     Heart sounds: Normal heart sounds. No murmur. No friction rub. No gallop.   Pulmonary:     Effort: Pulmonary effort is normal. No respiratory distress.     Breath sounds: Normal  breath sounds. No stridor. No wheezing, rhonchi or rales.  Neurological:     Mental Status: He is alert and oriented to person, place, and time.  Psychiatric:        Mood and Affect: Mood normal.        Behavior: Behavior normal.        Thought Content: Thought content normal.        Judgment: Judgment normal.      Assessment and Plan :   1. Exposure to COVID-19 virus   2. Acute maxillary sinusitis, recurrence not specified   3. Sinus pain    Will cover patient for sinusitis given 2-week history of persistent and worsening sinus congestion and pain.  Will have him start amoxicillin and use prednisone as well given severity of his symptoms.  Recommended patient start Zyrtec and maintain this through the end of October.  COVID-19 testing pending. Counseled patient on potential for adverse effects with medications prescribed/recommended today, ER and return-to-clinic precautions discussed, patient verbalized understanding.    Jaynee Eagles, PA-C 03/04/19 1744

## 2019-03-04 NOTE — ED Triage Notes (Signed)
Pt presents with sinus issues; sinus congestion, facial pressure,  And ear pain.  Pt also presents for covid testing after having and exposure at work.

## 2019-03-06 LAB — NOVEL CORONAVIRUS, NAA (HOSP ORDER, SEND-OUT TO REF LAB; TAT 18-24 HRS): SARS-CoV-2, NAA: NOT DETECTED

## 2019-03-22 ENCOUNTER — Ambulatory Visit (HOSPITAL_COMMUNITY)
Admission: EM | Admit: 2019-03-22 | Discharge: 2019-03-22 | Disposition: A | Discharge status: Home / Self Care | Payer: BC Managed Care – PPO | Attending: Urgent Care | Admitting: Urgent Care

## 2019-03-22 ENCOUNTER — Encounter (HOSPITAL_COMMUNITY): Payer: Self-pay

## 2019-03-22 DIAGNOSIS — R000 Tachycardia, unspecified: Secondary | ICD-10-CM | POA: Diagnosis not present

## 2019-03-22 DIAGNOSIS — J3089 Other allergic rhinitis: Secondary | ICD-10-CM | POA: Diagnosis not present

## 2019-03-22 DIAGNOSIS — Z20828 Contact with and (suspected) exposure to other viral communicable diseases: Secondary | ICD-10-CM | POA: Diagnosis not present

## 2019-03-22 DIAGNOSIS — J309 Allergic rhinitis, unspecified: Secondary | ICD-10-CM | POA: Diagnosis not present

## 2019-03-22 DIAGNOSIS — R0981 Nasal congestion: Secondary | ICD-10-CM | POA: Insufficient documentation

## 2019-03-22 DIAGNOSIS — I10 Essential (primary) hypertension: Secondary | ICD-10-CM | POA: Insufficient documentation

## 2019-03-22 DIAGNOSIS — Z791 Long term (current) use of non-steroidal anti-inflammatories (NSAID): Secondary | ICD-10-CM | POA: Insufficient documentation

## 2019-03-22 DIAGNOSIS — Z79899 Other long term (current) drug therapy: Secondary | ICD-10-CM | POA: Diagnosis not present

## 2019-03-22 DIAGNOSIS — R42 Dizziness and giddiness: Secondary | ICD-10-CM | POA: Diagnosis not present

## 2019-03-22 MED ORDER — CETIRIZINE HCL 10 MG PO TABS: 10.0000 mg | ORAL_TABLET | Freq: Every day | ORAL | 0 refills | Status: DC

## 2019-03-22 MED ORDER — AMLODIPINE BESYLATE 5 MG PO TABS: 5.0000 mg | ORAL_TABLET | Freq: Every day | ORAL | 0 refills | Status: DC

## 2019-03-22 MED ORDER — MONTELUKAST SODIUM 10 MG PO TABS: 10.0000 mg | ORAL_TABLET | Freq: Every day | ORAL | 0 refills | Status: DC

## 2019-03-22 NOTE — ED Triage Notes (Signed)
Pt present nasal drainage and light headed symptoms started two days ago.

## 2019-03-22 NOTE — ED Provider Notes (Signed)
MRN: [id number] DOB: 04-01-1976  Subjective:   Jordan [name] is a 43 y.o. male presenting for 2-day history of recurrent sinus congestion, sinus pressure.  Patient is taking over-the-counter medication for this but what prompted his visit is that he felt some lightheadedness today and heart racing when he was sweeping.  At his last office visit with me we had started him on amlodipine on 03/04/2019.  He was advised to establish care with [facility] internal medicine but did not do so.  Denies fever, headache, confusion, weakness on one side of the body, sore throat, chest pain, shortness of breath, nausea, vomiting, belly pain.  Patient works in a [employer].  Denies smoking cigarettes.  No current facility-administered medications for this encounter.   Current Outpatient Medications:  .  amLODipine (NORVASC) 5 MG tablet, Take 1 tablet (5 mg total) by mouth daily., Disp: 90 tablet, Rfl: 0 .  cetirizine (ZYRTEC ALLERGY) 10 MG tablet, Take 1 tablet (10 mg total) by mouth daily., Disp: 90 tablet, Rfl: 0 .  loperamide (IMODIUM) 2 MG capsule, Take 1 capsule (2 mg total) by mouth daily as needed for diarrhea or loose stools., Disp: 10 capsule, Rfl: 0 .  meloxicam (MOBIC) 7.5 MG tablet, Take 1 tablet (7.5 mg total) by mouth daily., Disp: 30 tablet, Rfl: 0 .  montelukast (SINGULAIR) 10 MG tablet, Take 1 tablet (10 mg total) by mouth at bedtime., Disp: 90 tablet, Rfl: 0   No Known Allergies  History reviewed. No pertinent past medical history.   History reviewed. No pertinent surgical history.  ROS  Objective:   Vitals: BP 133/90 (BP Location: Left Arm)   Pulse 70   Temp 98.1 F (36.7 C) (Oral)   SpO2 100%   Physical Exam Constitutional:      General: He is not in acute distress.    Appearance: Normal appearance. He is well-developed. He is not ill-appearing, toxic-appearing or diaphoretic.  HENT:     Head: Normocephalic and atraumatic.     Right Ear: External ear normal.     Left Ear: External  ear normal.     Nose: Congestion present. No rhinorrhea.     Mouth/Throat:     Mouth: Mucous membranes are moist.     Comments: Significant postnasal drainage in throat. Eyes:     General: No scleral icterus.    Extraocular Movements: Extraocular movements intact.     Pupils: Pupils are equal, round, and reactive to light.  Neck:     Musculoskeletal: Normal range of motion and neck supple. No muscular tenderness.     Vascular: No carotid bruit.  Cardiovascular:     Rate and Rhythm: Normal rate and regular rhythm.     Heart sounds: Normal heart sounds. No murmur. No friction rub. No gallop.   Pulmonary:     Effort: Pulmonary effort is normal. No respiratory distress.     Breath sounds: Normal breath sounds. No stridor. No wheezing, rhonchi or rales.  Musculoskeletal: Normal range of motion.     Right lower leg: No edema.     Left lower leg: No edema.  Lymphadenopathy:     Cervical: No cervical adenopathy.  Neurological:     Mental Status: He is alert and oriented to person, place, and time.     Cranial Nerves: No cranial nerve deficit.     Motor: No weakness.     Coordination: Coordination normal.     Gait: Gait normal.     Deep Tendon Reflexes: Reflexes normal.  Psychiatric:  Mood and Affect: Mood normal.        Behavior: Behavior normal.        Thought Content: Thought content normal.        Judgment: Judgment normal.     Assessment and Plan :   1. Allergic rhinitis due to other allergic trigger, unspecified seasonality   2. Lightheadedness   3. Essential hypertension     COVID-19 testing pending.  Counseled patient that he needs to be on oral antihistamine together with Singulair every day to address allergic rhinitis.  Refilled his amlodipine and emphasized establishing care with PCP through [facility] internal medicine.  Discussed differential for lightheadedness and palpitations. Counseled patient on potential for adverse effects with medications  prescribed/recommended today, ER and return-to-clinic precautions discussed, patient verbalized understanding.    Jaynee Eagles, [place] 03/22/19 1609

## 2019-03-23 LAB — NOVEL CORONAVIRUS, NAA (HOSP ORDER, SEND-OUT TO REF LAB; TAT 18-24 HRS): SARS-CoV-2, NAA: NOT DETECTED

## 2019-04-03 ENCOUNTER — Other Ambulatory Visit: Payer: Self-pay

## 2019-04-03 ENCOUNTER — Ambulatory Visit (HOSPITAL_COMMUNITY)
Admission: EM | Admit: 2019-04-03 | Discharge: 2019-04-03 | Disposition: A | Discharge status: Home / Self Care | Payer: BC Managed Care – PPO | Attending: Emergency Medicine | Admitting: Emergency Medicine

## 2019-04-03 ENCOUNTER — Encounter (HOSPITAL_COMMUNITY): Payer: Self-pay

## 2019-04-03 DIAGNOSIS — Z79899 Other long term (current) drug therapy: Secondary | ICD-10-CM | POA: Diagnosis not present

## 2019-04-03 DIAGNOSIS — R05 Cough: Secondary | ICD-10-CM

## 2019-04-03 DIAGNOSIS — R059 Cough, unspecified: Secondary | ICD-10-CM

## 2019-04-03 DIAGNOSIS — Z20828 Contact with and (suspected) exposure to other viral communicable diseases: Secondary | ICD-10-CM | POA: Insufficient documentation

## 2019-04-03 MED ORDER — BENZONATATE 100 MG PO CAPS: 100.0000 mg | ORAL_CAPSULE | Freq: Three times a day (TID) | ORAL | 0 refills | Status: DC | PRN

## 2019-04-03 NOTE — ED Triage Notes (Signed)
Pt states he is having cough x 2 days. Pt states he feels hot at night like having fever x 2 days. Pt is having nasal congestion and drainage x 3 weeks aprox.

## 2019-04-03 NOTE — Discharge Instructions (Addendum)
Take the cough medication as directed.    Your COVID test is pending.  You should self quarantine until your test result is back and is negative.    Go to the emergency department if you develop high fever, shortness of breath, severe diarrhea, or other concerning symptoms.

## 2019-04-03 NOTE — ED Provider Notes (Signed)
[facility]    CSN: [id number] Arrival date & time: 04/03/19  1415      History   Chief Complaint Chief Complaint  Patient presents with  . Cough  . Nasal Congestion  . Fever    HPI Jordan [name] is a 43 y.o. male.   Patient presents with 2-day history of nonproductive cough.  He reports feeling warm at home but has not taken his temperature.  He also reports ongoing nasal congestion x3 weeks.  No treatments attempted at home.  Patient denies sore throat, vomiting, diarrhea, rash, or other symptoms.  He tested negative for Covid on 03/22/2019, 03/04/2019, and 01/11/2019.  The history is provided by the patient.    History reviewed. No pertinent past medical history.  There are no active problems to display for this patient.   History reviewed. No pertinent surgical history.     Home Medications    Prior to Admission medications   Medication Sig Start Date End Date Taking? Authorizing Provider  amLODipine (NORVASC) 5 MG tablet Take 1 tablet (5 mg total) by mouth daily. 03/22/19   Wallis BambergMani, Mario, PA-C  benzonatate (TESSALON PERLES) 100 MG capsule Take 1 capsule (100 mg total) by mouth 3 (three) times daily as needed for cough. 04/03/19   Mickie Bailate, Kameria Canizares H, NP  cetirizine (ZYRTEC ALLERGY) 10 MG tablet Take 1 tablet (10 mg total) by mouth daily. 03/22/19   Wallis BambergMani, Mario, PA-C  loperamide (IMODIUM) 2 MG capsule Take 1 capsule (2 mg total) by mouth daily as needed for diarrhea or loose stools. 01/11/19   Wallis BambergMani, Mario, PA-C  meloxicam (MOBIC) 7.5 MG tablet Take 1 tablet (7.5 mg total) by mouth daily. 01/11/19   Wallis BambergMani, Mario, PA-C  montelukast (SINGULAIR) 10 MG tablet Take 1 tablet (10 mg total) by mouth at bedtime. 03/22/19   Wallis BambergMani, Mario, PA-C  fluticasone (FLONASE) 50 MCG/ACT nasal spray Place 1 spray into both nostrils daily. 06/01/18 12/31/18  Elisha PonderMurray, Alyssa B, PA-C    Family History Family History  Problem Relation Age of Onset  . Healthy Mother   . Healthy Father     Social  History Social History   Tobacco Use  . Smoking status: Never Smoker  . Smokeless tobacco: Never Used  Substance Use Topics  . Alcohol use: Yes    Comment: occ  . Drug use: No     Allergies   Patient has no known allergies.   Review of Systems Review of Systems  Constitutional: Negative for chills and fever.  HENT: Positive for congestion. Negative for ear pain and sore throat.   Eyes: Negative for pain and visual disturbance.  Respiratory: Positive for cough. Negative for shortness of breath.   Cardiovascular: Negative for chest pain and palpitations.  Gastrointestinal: Negative for abdominal pain and vomiting.  Genitourinary: Negative for dysuria and hematuria.  Musculoskeletal: Negative for arthralgias and back pain.  Skin: Negative for color change and rash.  Neurological: Negative for seizures and syncope.  All other systems reviewed and are negative.    Physical Exam Triage Vital Signs ED Triage Vitals  Enc Vitals Group     BP      Pulse      Resp      Temp      Temp src      SpO2      Weight      Height      Head Circumference      Peak Flow      Pain  Score      Pain Loc      Pain Edu?      Excl. in [place]?    No data found.  Updated Vital Signs BP (!) 128/94 (BP Location: Left Arm)   Pulse 79   Temp 98.3 F (36.8 C) (Temporal)   Resp 16   SpO2 99%   Visual Acuity Right Eye Distance:   Left Eye Distance:   Bilateral Distance:    Right Eye Near:   Left Eye Near:    Bilateral Near:     Physical Exam Vitals signs and nursing note reviewed.  Constitutional:      General: He is not in acute distress.    Appearance: He is well-developed. He is not ill-appearing.  HENT:     Head: Normocephalic and atraumatic.     Right Ear: Tympanic membrane normal.     Left Ear: Tympanic membrane normal.     Nose: Nose normal.     Mouth/Throat:     Mouth: Mucous membranes are moist.     Pharynx: Oropharynx is clear.  Eyes:     Conjunctiva/sclera:  Conjunctivae normal.  Neck:     Musculoskeletal: Neck supple.  Cardiovascular:     Rate and Rhythm: Normal rate and regular rhythm.     Heart sounds: No murmur.  Pulmonary:     Effort: Pulmonary effort is normal. No respiratory distress.     Breath sounds: Normal breath sounds.  Abdominal:     General: Bowel sounds are normal.     Palpations: Abdomen is soft.     Tenderness: There is no abdominal tenderness. There is no guarding or rebound.  Skin:    General: Skin is warm and dry.     Findings: No rash.  Neurological:     Mental Status: He is alert.      UC Treatments / Results  Labs (all labs ordered are listed, but only abnormal results are displayed) Labs Reviewed  NOVEL CORONAVIRUS, NAA (HOSP ORDER, SEND-OUT TO REF LAB; TAT 18-24 HRS)    EKG   Radiology No results found.  Procedures Procedures (including critical care time)  Medications Ordered in UC Medications - No data to display  Initial Impression / Assessment and Plan / UC Course  I have reviewed the triage vital signs and the nursing notes.  Pertinent labs & imaging results that were available during my care of the patient were reviewed by me and considered in my medical decision making (see chart for details).    Cough.  Treating with Tessalon.  COVID test performed here.  Instructed patient to self quarantine until the test result is back.  Instructed patient to go to the emergency department if he develops high fever, shortness of breath, severe diarrhea, or other concerning symptoms.  Patient agrees with plan of care.   Final Clinical Impressions(s) / UC Diagnoses   Final diagnoses:  Cough     Discharge Instructions     Take the cough medication as directed.    Your COVID test is pending.  You should self quarantine until your test result is back and is negative.    Go to the emergency department if you develop high fever, shortness of breath, severe diarrhea, or other concerning symptoms.        ED Prescriptions    Medication Sig Dispense Auth. Provider   benzonatate (TESSALON PERLES) 100 MG capsule Take 1 capsule (100 mg total) by mouth 3 (three) times daily as needed for  cough. 20 capsule Mickie Bail, NP     PDMP not reviewed this encounter.   Mickie Bail, NP 04/03/19 [phone]

## 2019-04-05 LAB — NOVEL CORONAVIRUS, NAA (HOSP ORDER, SEND-OUT TO REF LAB; TAT 18-24 HRS): SARS-CoV-2, NAA: NOT DETECTED

## 2019-04-26 ENCOUNTER — Encounter (HOSPITAL_COMMUNITY): Payer: Self-pay | Admitting: Emergency Medicine

## 2019-04-26 ENCOUNTER — Ambulatory Visit (HOSPITAL_COMMUNITY)
Admission: EM | Admit: 2019-04-26 | Discharge: 2019-04-26 | Disposition: A | Discharge status: Home / Self Care | Payer: BC Managed Care – PPO | Attending: Nurse Practitioner | Admitting: Nurse Practitioner

## 2019-04-26 ENCOUNTER — Other Ambulatory Visit: Payer: Self-pay

## 2019-04-26 DIAGNOSIS — J209 Acute bronchitis, unspecified: Secondary | ICD-10-CM | POA: Insufficient documentation

## 2019-04-26 DIAGNOSIS — Z20828 Contact with and (suspected) exposure to other viral communicable diseases: Secondary | ICD-10-CM | POA: Insufficient documentation

## 2019-04-26 DIAGNOSIS — Z20822 Contact with and (suspected) exposure to covid-19: Secondary | ICD-10-CM

## 2019-04-26 MED ORDER — METHYLPREDNISOLONE 4 MG PO TBPK: ORAL_TABLET | ORAL | 0 refills | Status: DC

## 2019-04-26 MED ORDER — DOXYCYCLINE HYCLATE 100 MG PO CAPS: 100.0000 mg | ORAL_CAPSULE | Freq: Two times a day (BID) | ORAL | 0 refills | Status: AC

## 2019-04-26 MED ORDER — PROMETHAZINE-DM 6.25-15 MG/5ML PO SYRP: 5.0000 mL | ORAL_SOLUTION | Freq: Four times a day (QID) | ORAL | 0 refills | Status: DC | PRN

## 2019-04-26 MED ORDER — DM-GUAIFENESIN ER 30-600 MG PO TB12: 1.0000 | ORAL_TABLET | Freq: Two times a day (BID) | ORAL | 0 refills | Status: AC

## 2019-04-26 NOTE — Discharge Instructions (Addendum)
Take medications as prescribed. You may take tylenol or ibuprofen as needed for fevers/headache/body aches. Drink plenty of fluids. Stay in home isolation until you receive results of your COVID test. You will only be notified for positive results. You may go online to MyChart in the next few days and review your results. Please follow CDC guidelines that are attached. You may discontinue home isolation when there has been at least 10 days since symptoms onset AND 3 days fever free without antipyretics (Tylenol or Ibuprofen) AND an overall improvement in your symptoms. Go to the ED immediately if you get worse or have any other symptoms.   Feel better soon!  Stefanie Hodgens, FNP-C   

## 2019-04-26 NOTE — ED Triage Notes (Signed)
Pt states hes had a cough since the 5th, was seen here on the 6th, had a covid test which was negative. Pt states he was given tessalon, c/o ongoing cough, states the last week his cough gets worse in the mornings.

## 2019-04-26 NOTE — ED Provider Notes (Signed)
[facility]    CSN: [id number] Arrival date & time: 04/26/19  1227      History   Chief Complaint Chief Complaint  Patient presents with  . Cough    HPI Jordan [name] is a 43 y.o. male.   Subjective:   Jordan [name] is a 43 y.o. male here for evaluation of a cough.  The cough is productive of green/yellow sputum and is aggravated by nothing. Onset of symptoms was 3 weeks ago and has been unchanged since that time.  He was seen here on 11/5. COVID test negative. Received a prescription for Benzonatate which was minimally helpful. Associated symptoms include shortness of breath and sore throat. He denies any fevers, chills, sweats, body aches, nausea, vomiting, diarrhea, wheezing, headache, dizziness or change in taste/smell. No known exposure to COVID-19. Patient does not have a history of asthma. Patient has not had recent travel. Patient does not have a history of smoking.   The following portions of the patient's history were reviewed and updated as appropriate: allergies, current medications, past family history, past medical history, past social history, past surgical history and problem list.       History reviewed. No pertinent past medical history.  There are no active problems to display for this patient.   History reviewed. No pertinent surgical history.     Home Medications    Prior to Admission medications   Medication Sig Start Date End Date Taking? Authorizing Provider  benzonatate (TESSALON PERLES) 100 MG capsule Take 1 capsule (100 mg total) by mouth 3 (three) times daily as needed for cough. 04/03/19   Mickie Bail, NP  cetirizine (ZYRTEC ALLERGY) 10 MG tablet Take 1 tablet (10 mg total) by mouth daily. 03/22/19   Wallis Bamberg, PA-C  dextromethorphan-guaiFENesin [facility] DM) 30-600 MG 12hr tablet Take 1 tablet by mouth 2 (two) times daily for 7 days. 04/26/19 05/03/19  Lurline Idol, FNP  doxycycline (VIBRAMYCIN) 100 MG capsule Take 1 capsule  (100 mg total) by mouth 2 (two) times daily for 7 days. 04/26/19 05/03/19  Lurline Idol, FNP  loperamide (IMODIUM) 2 MG capsule Take 1 capsule (2 mg total) by mouth daily as needed for diarrhea or loose stools. 01/11/19   Wallis Bamberg, PA-C  meloxicam (MOBIC) 7.5 MG tablet Take 1 tablet (7.5 mg total) by mouth daily. 01/11/19   Wallis Bamberg, PA-C  methylPREDNISolone (MEDROL DOSEPAK) 4 MG TBPK tablet Take as directed 04/26/19   Lurline Idol, FNP  montelukast (SINGULAIR) 10 MG tablet Take 1 tablet (10 mg total) by mouth at bedtime. 03/22/19   Wallis Bamberg, PA-C  promethazine-dextromethorphan (PROMETHAZINE-DM) 6.25-15 MG/5ML syrup Take 5 mLs by mouth 4 (four) times daily as needed for cough. 04/26/19   Lurline Idol, FNP  amLODipine (NORVASC) 5 MG tablet Take 1 tablet (5 mg total) by mouth daily. 03/22/19 04/26/19  Wallis Bamberg, PA-C  fluticasone (FLONASE) 50 MCG/ACT nasal spray Place 1 spray into both nostrils daily. 06/01/18 12/31/18  Elisha Ponder, PA-C    Family History Family History  Problem Relation Age of Onset  . Healthy Mother   . Healthy Father     Social History Social History   Tobacco Use  . Smoking status: Never Smoker  . Smokeless tobacco: Never Used  Substance Use Topics  . Alcohol use: Yes    Comment: occ  . Drug use: No     Allergies   Patient has no known allergies.   Review of Systems Review of Systems  Constitutional:  Negative for chills, diaphoresis, fatigue and fever.  HENT: Positive for sore throat. Negative for congestion.   Respiratory: Positive for cough and shortness of breath. Negative for wheezing.   Gastrointestinal: Negative for diarrhea, nausea and vomiting.  Musculoskeletal: Negative for myalgias.  Neurological: Negative for dizziness and headaches.  All other systems reviewed and are negative.    Physical Exam Triage Vital Signs ED Triage Vitals  Enc Vitals Group     BP 04/26/19 1305 (!) 153/99     Pulse Rate 04/26/19 1305 73      Resp 04/26/19 1305 18     Temp 04/26/19 1305 98.3 F (36.8 C)     Temp src --      SpO2 04/26/19 1305 97 %     Weight --      Height --      Head Circumference --      Peak Flow --      Pain Score 04/26/19 1307 0     Pain Loc --      Pain Edu? --      Excl. in GC? --    No data found.  Updated Vital Signs BP (!) 153/99   Pulse 73   Temp 98.3 F (36.8 C)   Resp 18   SpO2 97%   Visual Acuity Right Eye Distance:   Left Eye Distance:   Bilateral Distance:    Right Eye Near:   Left Eye Near:    Bilateral Near:     Physical Exam Vitals signs reviewed.  Constitutional:      General: He is not in acute distress.    Appearance: Normal appearance. He is not ill-appearing, toxic-appearing or diaphoretic.  HENT:     Head: Normocephalic.     Mouth/Throat:     Mouth: Mucous membranes are moist.  Neck:     Musculoskeletal: Normal range of motion and neck supple.  Cardiovascular:     Rate and Rhythm: Normal rate and regular rhythm.  Pulmonary:     Effort: Pulmonary effort is normal.     Breath sounds: Normal breath sounds.  Musculoskeletal: Normal range of motion.  Lymphadenopathy:     Cervical: No cervical adenopathy.  Skin:    General: Skin is warm and dry.  Neurological:     General: No focal deficit present.     Mental Status: He is alert and oriented to person, place, and time.      UC Treatments / Results  Labs (all labs ordered are listed, but only abnormal results are displayed) Labs Reviewed  NOVEL CORONAVIRUS, NAA (HOSP ORDER, SEND-OUT TO REF LAB; TAT 18-24 HRS)    EKG   Radiology No results found.  Procedures Procedures (including critical care time)  Medications Ordered in UC Medications - No data to display  Initial Impression / Assessment and Plan / UC Course  I have reviewed the triage vital signs and the nursing notes.  Pertinent labs & imaging results that were available during my care of the patient were reviewed by me and  considered in my medical decision making (see chart for details).    43 yo male with a three week history of cough with shortness of breath and sore throat. Patient is afebrile. Nontoxic appearing. Physical exam is unremarkable. He is a nonsmoker. No history of lung disease. No recent travel.  COVID-19 test pending. Antitussives, antibiotics and steroids per medication orders. B-agonist inhaler as needed.  Call if shortness of breath worsens, blood in sputum, change  in character of cough, development of fever or chills, inability to maintain nutrition and hydration.   Today's evaluation has revealed no signs of a dangerous process. Discussed diagnosis with patient and/or guardian. Patient and/or guardian aware of their diagnosis, possible red flag symptoms to watch out for and need for close follow up. Patient and/or guardian understands verbal and written discharge instructions. Patient and/or guardian comfortable with plan and disposition.  Patient and/or guardian has a clear mental status at this time, good insight into illness (after discussion and teaching) and has clear judgment to make decisions regarding their care  This care was provided during an unprecedented National Emergency due to the Novel Coronavirus (COVID-19) pandemic. COVID-19 infections and transmission risks place heavy strains on healthcare resources.  As this pandemic evolves, our facility, providers, and staff strive to respond fluidly, to remain operational, and to provide care relative to available resources and information. Outcomes are unpredictable and treatments are without well-defined guidelines. Further, the impact of COVID-19 on all aspects of urgent care, including the impact to patients seeking care for reasons other than COVID-19, is unavoidable during this national emergency. At this time of the global pandemic, management of patients has significantly changed, even for non-COVID positive patients given high local and  regional COVID volumes at this time requiring high healthcare system and resource utilization. The standard of care for management of both COVID suspected and non-COVID suspected patients continues to change rapidly at the local, regional, national, and global levels. This patient was worked up and treated to the best available but ever changing evidence and resources available at this current time.   Documentation was completed with the aid of voice recognition software. Transcription may contain typographical errors.  Final Clinical Impressions(s) / UC Diagnoses   Final diagnoses:  Acute bronchitis, unspecified organism  Encounter for screening laboratory testing for COVID-19 virus     Discharge Instructions     Take medications as prescribed. You may take tylenol or ibuprofen as needed for fevers/headache/body aches. Drink plenty of fluids. Stay in home isolation until you receive results of your COVID test. You will only be notified for positive results. You may go online to MyChart in the next few days and review your results. Please follow CDC guidelines that are attached. You may discontinue home isolation when there has been at least 10 days since symptoms onset AND 3 days fever free without antipyretics (Tylenol or Ibuprofen) AND an overall improvement in your symptoms. Go to the ED immediately if you get worse or have any other symptoms.   Feel better soon!  Aldona Bar, FNP-C      ED Prescriptions    Medication Sig Dispense Auth. Provider   doxycycline (VIBRAMYCIN) 100 MG capsule Take 1 capsule (100 mg total) by mouth 2 (two) times daily for 7 days. 14 capsule Markeya Mincy, Aldona Bar, FNP   methylPREDNISolone (MEDROL DOSEPAK) 4 MG TBPK tablet Take as directed 21 tablet Alexandre Faries, Aldona Bar, FNP   dextromethorphan-guaiFENesin (MUCINEX DM) 30-600 MG 12hr tablet Take 1 tablet by mouth 2 (two) times daily for 7 days. 14 tablet Enrique Sack, FNP   promethazine-dextromethorphan  (PROMETHAZINE-DM) 6.25-15 MG/5ML syrup Take 5 mLs by mouth 4 (four) times daily as needed for cough. 120 mL Enrique Sack, FNP     PDMP not reviewed this encounter.   Enrique Sack, [place] 04/26/19 1321

## 2019-04-28 LAB — NOVEL CORONAVIRUS, NAA (HOSP ORDER, SEND-OUT TO REF LAB; TAT 18-24 HRS): SARS-CoV-2, NAA: NOT DETECTED

## 2019-05-17 ENCOUNTER — Ambulatory Visit (HOSPITAL_COMMUNITY)
Admission: EM | Admit: 2019-05-17 | Discharge: 2019-05-17 | Disposition: A | Discharge status: Home / Self Care | Payer: BC Managed Care – PPO | Attending: Family Medicine | Admitting: Family Medicine

## 2019-05-17 ENCOUNTER — Encounter (HOSPITAL_COMMUNITY): Payer: Self-pay

## 2019-05-17 ENCOUNTER — Other Ambulatory Visit: Payer: Self-pay

## 2019-05-17 DIAGNOSIS — J069 Acute upper respiratory infection, unspecified: Secondary | ICD-10-CM | POA: Diagnosis not present

## 2019-05-17 DIAGNOSIS — R05 Cough: Secondary | ICD-10-CM | POA: Diagnosis not present

## 2019-05-17 DIAGNOSIS — Z20828 Contact with and (suspected) exposure to other viral communicable diseases: Secondary | ICD-10-CM | POA: Diagnosis not present

## 2019-05-17 DIAGNOSIS — H9203 Otalgia, bilateral: Secondary | ICD-10-CM | POA: Diagnosis not present

## 2019-05-17 DIAGNOSIS — Z20822 Contact with and (suspected) exposure to covid-19: Secondary | ICD-10-CM

## 2019-05-17 DIAGNOSIS — Z0289 Encounter for other administrative examinations: Secondary | ICD-10-CM

## 2019-05-17 DIAGNOSIS — B9789 Other viral agents as the cause of diseases classified elsewhere: Secondary | ICD-10-CM

## 2019-05-17 DIAGNOSIS — Z760 Encounter for issue of repeat prescription: Secondary | ICD-10-CM

## 2019-05-17 MED ORDER — ALBUTEROL SULFATE HFA 108 (90 BASE) MCG/ACT IN AERS
INHALATION_SPRAY | RESPIRATORY_TRACT | Status: AC
  Filled 2019-05-17: qty 6.7

## 2019-05-17 MED ORDER — BENZONATATE 100 MG PO CAPS: 100.0000 mg | ORAL_CAPSULE | Freq: Three times a day (TID) | ORAL | 0 refills | Status: DC | PRN

## 2019-05-17 MED ORDER — ALBUTEROL SULFATE HFA 108 (90 BASE) MCG/ACT IN AERS
2.0000 | INHALATION_SPRAY | Freq: Once | RESPIRATORY_TRACT | Status: AC
  Administered 2019-05-17: 15:00:00 2 via RESPIRATORY_TRACT

## 2019-05-17 MED ORDER — FLUTICASONE PROPIONATE 50 MCG/ACT NA SUSP: 2.0000 | Freq: Every day | NASAL | 2 refills | Status: DC

## 2019-05-17 NOTE — ED Provider Notes (Signed)
[place]    CSN: [id number] Arrival date & time: 05/17/19  1408      History   Chief Complaint Chief Complaint  Patient presents with  . Sore Throat  . Migraine  . Chest Pain  . Otalgia    HPI Jordan [name] is a 43 y.o. male.   HPI  Jordan [name] presents today with concerns of sore throat, chest congestion, and ear pain x 3 days. Patient has had multiple negative COVID tests within the course of last 60 days.  Endorses non-productive cough and persistent mucus accumulation nose and chest is worst in the morning time when he awakens. Endorses a mild HA which improved with ibuprofen. Continue Flonase and cetrizine daily. Recently completed a prednisone taper with temporary relief. Endorse improvement with daily Singulair and requests a refill. Has an albuterol inhaler in the past which helped with cyclic cough-requests a refill. Reports no formal diagnosis of asthma. No wheezing, fever, nausea, vomiting, SOB, or abdominal pain.  History reviewed. No pertinent past medical history.  There are no problems to display for this patient.   History reviewed. No pertinent surgical history.     Home Medications    Prior to Admission medications   Medication Sig Start Date End Date Taking? Authorizing Provider  benzonatate (TESSALON PERLES) 100 MG capsule Take 1 capsule (100 mg total) by mouth 3 (three) times daily as needed for cough. 04/03/19   Sharion Balloon, NP  cetirizine (ZYRTEC ALLERGY) 10 MG tablet Take 1 tablet (10 mg total) by mouth daily. 03/22/19   Jaynee Eagles, PA-C  loperamide (IMODIUM) 2 MG capsule Take 1 capsule (2 mg total) by mouth daily as needed for diarrhea or loose stools. 01/11/19   Jaynee Eagles, PA-C  meloxicam (MOBIC) 7.5 MG tablet Take 1 tablet (7.5 mg total) by mouth daily. 01/11/19   Jaynee Eagles, PA-C  methylPREDNISolone (MEDROL DOSEPAK) 4 MG TBPK tablet Take as directed 04/26/19   Enrique Sack, FNP  montelukast (SINGULAIR) 10 MG tablet Take 1  tablet (10 mg total) by mouth at bedtime. 03/22/19   Jaynee Eagles, PA-C  promethazine-dextromethorphan (PROMETHAZINE-DM) 6.25-15 MG/5ML syrup Take 5 mLs by mouth 4 (four) times daily as needed for cough. 04/26/19   Enrique Sack, FNP  amLODipine (NORVASC) 5 MG tablet Take 1 tablet (5 mg total) by mouth daily. 03/22/19 04/26/19  Jaynee Eagles, PA-C  fluticasone (FLONASE) 50 MCG/ACT nasal spray Place 1 spray into both nostrils daily. 06/01/18 12/31/18  Albesa Seen, PA-C    Family History Family History  Problem Relation Age of Onset  . Healthy Mother   . Healthy Father     Social History Social History   Tobacco Use  . Smoking status: Never Smoker  . Smokeless tobacco: Never Used  Substance Use Topics  . Alcohol use: Yes    Comment: occ  . Drug use: No     Allergies   Patient has no known allergies.   Review of Systems Review of Systems Pertinent negatives listed in HPI Physical Exam Triage Vital Signs ED Triage Vitals [05/17/19 1453]  Enc Vitals Group     BP (!) 147/91     Pulse Rate 80     Resp 18     Temp 98.4 F (36.9 C)     Temp Source Oral     SpO2 99 %     Weight      Height      Head Circumference      Peak Flow  Pain Score 6     Pain Loc      Pain Edu?      Excl. in GC?    No data found.  Updated Vital Signs BP (!) 147/91 (BP Location: Left Arm)   Pulse 80   Temp 98.4 F (36.9 C) (Oral)   Resp 18   SpO2 99%   Visual Acuity Right Eye Distance:   Left Eye Distance:   Bilateral Distance:    Right Eye Near:   Left Eye Near:    Bilateral Near:     Physical Exam General appearance: alert, well developed, well nourished, cooperative and in no distress Head: Normocephalic, without obvious abnormality, atraumatic ENT: TM visible-normal, oropharynx non-erythematous, tonsil normal  Respiratory: Respirations even and unlabored, normal respiratory rate Heart: rate and rhythm normal. No gallop or murmurs noted on exam  Abdomen: BS +, no  distention, no rebound tenderness, or no mass Extremities: No gross deformities Skin: Skin color, texture, turgor normal. No rashes seen  Psych: Appropriate mood and affect. Neurologic: Mental status: Alert, oriented to person, place, and time, thought content appropriate.  UC Treatments / Results  Labs (all labs ordered are listed, but only abnormal results are displayed) Labs Reviewed  NOVEL CORONAVIRUS, NAA (HOSP ORDER, SEND-OUT TO REF LAB; TAT 18-24 HRS)    EKG   Radiology No results found.  Procedures Procedures (including critical care time)  Medications Ordered in UC Medications - No data to display  Initial Impression / Assessment and Plan / UC Course  I have reviewed the triage vital signs and the nursing notes.  Pertinent labs & imaging results that were available during my care of the patient were reviewed by me and considered in my medical decision making (see chart for details).  Viral upper respiratory tract infection with bilateral ear pain. Patient has experienced recurrent respiratory system over the course of last few months with multiple negative COVID-19 tests. Discussed with patient symptoms are related to underlying chronic environmental allergies. Pt reports he is required to have a negative COVID test to return to work, given symptoms. Work note provided. Symptomatic treated warranted. Advised a steroid taper is not indicated as he recently completed a taper dose pack. Refilled Singulair, Benzonatate, Flonase, and Albuterol inhaler provided while in clinic. Patient verbalized understanding and agreement with plan.  Final Clinical Impressions(s) / UC Diagnoses   Final diagnoses:  Viral upper respiratory tract infection  Otalgia of both ears  Encounter for laboratory testing for COVID-19 virus     Discharge Instructions        Person Under Monitoring Name: Jordan [name]  Location: [address] [address] [place] [address]   Infection Prevention  Recommendations for Individuals Confirmed to have, or Being Evaluated for, 2019 Novel Coronavirus (COVID-19) Infection Who Receive Care at Home  Individuals who are confirmed to have, or are being evaluated for, COVID-19 should follow the prevention steps below until a healthcare provider or local or state health department says they can return to normal activities.  Stay home except to get medical care You should restrict activities outside your home, except for getting medical care. Do not go to work, school, or public areas, and do not use public transportation or taxis.  Call ahead before visiting your doctor Before your medical appointment, call the healthcare provider and tell them that you have, or are being evaluated for, COVID-19 infection. This will help the healthcare provider's office take steps to keep other people from getting infected. Ask your healthcare  provider to call the local or state health department.  Monitor your symptoms Seek prompt medical attention if your illness is worsening (e.g., difficulty breathing). Before going to your medical appointment, call the healthcare provider and tell them that you have, or are being evaluated for, COVID-19 infection. Ask your healthcare provider to call the local or state health department.  Wear a facemask You should wear a facemask that covers your nose and mouth when you are in the same room with other people and when you visit a healthcare provider. People who live with or visit you should also wear a facemask while they are in the same room with you.  Separate yourself from other people in your home As much as possible, you should stay in a different room from other people in your home. Also, you should use a separate bathroom, if available.  Avoid sharing household items You should not share dishes, drinking glasses, cups, eating utensils, towels, bedding, or other items with other people in your home. After using  these items, you should wash them thoroughly with soap and water.  Cover your coughs and sneezes Cover your mouth and nose with a tissue when you cough or sneeze, or you can cough or sneeze into your sleeve. Throw used tissues in a lined trash can, and immediately wash your hands with soap and water for at least 20 seconds or use an alcohol-based hand rub.  Wash your [employer] your hands often and thoroughly with soap and water for at least 20 seconds. You can use an alcohol-based hand sanitizer if soap and water are not available and if your hands are not visibly dirty. Avoid touching your eyes, nose, and mouth with unwashed hands.   Prevention Steps for Caregivers and Household Members of Individuals Confirmed to have, or Being Evaluated for, COVID-19 Infection Being Cared for in the Home  If you live with, or provide care at home for, a person confirmed to have, or being evaluated for, COVID-19 infection please follow these guidelines to prevent infection:  Follow healthcare provider's instructions Make sure that you understand and can help the patient follow any healthcare provider instructions for all care.  Provide for the patient's basic needs You should help the patient with basic needs in the home and provide support for getting groceries, prescriptions, and other personal needs.  Monitor the patient's symptoms If they are getting sicker, call his or her medical provider and tell them that the patient has, or is being evaluated for, COVID-19 infection. This will help the healthcare provider's office take steps to keep other people from getting infected. Ask the healthcare provider to call the local or state health department.  Limit the number of people who have contact with the patient  If possible, have only one caregiver for the patient.  Other household members should stay in another home or place of residence. If this is not possible, they should stay  in another  room, or be separated from the patient as much as possible. Use a separate bathroom, if available.  Restrict visitors who do not have an essential need to be in the home.  Keep older adults, very young children, and other sick people away from the patient Keep older adults, very young children, and those who have compromised immune systems or chronic health conditions away from the patient. This includes people with chronic heart, lung, or kidney conditions, diabetes, and cancer.  Ensure good ventilation Make sure that shared spaces in the home  have good air flow, such as from an air conditioner or an opened window, weather permitting.  Wash your hands often  Wash your hands often and thoroughly with soap and water for at least 20 seconds. You can use an alcohol based hand sanitizer if soap and water are not available and if your hands are not visibly dirty.  Avoid touching your eyes, nose, and mouth with unwashed hands.  Use disposable paper towels to dry your hands. If not available, use dedicated cloth towels and replace them when they become wet.  Wear a facemask and gloves  Wear a disposable facemask at all times in the room and gloves when you touch or have contact with the patient's blood, body fluids, and/or secretions or excretions, such as sweat, saliva, sputum, nasal mucus, vomit, urine, or feces.  Ensure the mask fits over your nose and mouth tightly, and do not touch it during use.  Throw out disposable facemasks and gloves after using them. Do not reuse.  Wash your hands immediately after removing your facemask and gloves.  If your personal clothing becomes contaminated, carefully remove clothing and launder. Wash your hands after handling contaminated clothing.  Place all used disposable facemasks, gloves, and other waste in a lined container before disposing them with other household waste.  Remove gloves and wash your hands immediately after handling these items.  Do  not share dishes, glasses, or other household items with the patient  Avoid sharing household items. You should not share dishes, drinking glasses, cups, eating utensils, towels, bedding, or other items with a patient who is confirmed to have, or being evaluated for, COVID-19 infection.  After the person uses these items, you should wash them thoroughly with soap and water.  Wash laundry thoroughly  Immediately remove and wash clothes or bedding that have blood, body fluids, and/or secretions or excretions, such as sweat, saliva, sputum, nasal mucus, vomit, urine, or feces, on them.  Wear gloves when handling laundry from the patient.  Read and follow directions on labels of laundry or clothing items and detergent. In general, wash and dry with the warmest temperatures recommended on the label.  Clean all areas the individual has used often  Clean all touchable surfaces, such as counters, tabletops, doorknobs, bathroom fixtures, toilets, phones, keyboards, tablets, and bedside tables, every day. Also, clean any surfaces that may have blood, body fluids, and/or secretions or excretions on them.  Wear gloves when cleaning surfaces the patient has come in contact with.  Use a diluted bleach solution (e.g., dilute bleach with 1 part bleach and 10 parts water) or a household disinfectant with a label that says EPA-registered for coronaviruses. To make a bleach solution at home, add 1 tablespoon of bleach to 1 quart (4 cups) of water. For a larger supply, add  cup of bleach to 1 gallon (16 cups) of water.  Read labels of cleaning products and follow recommendations provided on product labels. Labels contain instructions for safe and effective use of the cleaning product including precautions you should take when applying the product, such as wearing gloves or eye protection and making sure you have good ventilation during use of the product.  Remove gloves and wash hands immediately after  cleaning.  Monitor yourself for signs and symptoms of illness Caregivers and household members are considered close contacts, should monitor their health, and will be asked to limit movement outside of the home to the extent possible. Follow the monitoring steps for close contacts listed on the  symptom monitoring form.   ? If you have additional questions, contact your local health department or call the epidemiologist on call at [phone] (available 24/7). ? This guidance is subject to change. For the most up-to-date guidance from [facility], please refer to their website: TripMetro.hu    ED Prescriptions    None     PDMP not reviewed this encounter.   Bing Neighbors, [place] 05/19/19 [phone]

## 2019-05-17 NOTE — ED Triage Notes (Signed)
Pt present cough, sore throat, chest and ear pain. Symptoms started 3 days ago.

## 2019-05-17 NOTE — Discharge Instructions (Signed)
Person Under Monitoring Name: Jordan [name]  Location: [address] [address] [facility] [address]   Infection Prevention Recommendations for Individuals Confirmed to have, or Being Evaluated for, 2019 Novel Coronavirus (COVID-19) Infection Who Receive Care at Home  Individuals who are confirmed to have, or are being evaluated for, COVID-19 should follow the prevention steps below until a healthcare provider or local or state health department says they can return to normal activities.  Stay home except to get medical care You should restrict activities outside your home, except for getting medical care. Do not go to work, school, or public areas, and do not use public transportation or taxis.  Call ahead before visiting your doctor Before your medical appointment, call the healthcare provider and tell them that you have, or are being evaluated for, COVID-19 infection. This will help the healthcare provider's office take steps to keep other people from getting infected. Ask your healthcare provider to call the local or state health department.  Monitor your symptoms Seek prompt medical attention if your illness is worsening (e.g., difficulty breathing). Before going to your medical appointment, call the healthcare provider and tell them that you have, or are being evaluated for, COVID-19 infection. Ask your healthcare provider to call the local or state health department.  Wear a facemask You should wear a facemask that covers your nose and mouth when you are in the same room with other people and when you visit a healthcare provider. People who live with or visit you should also wear a facemask while they are in the same room with you.  Separate yourself from other people in your home As much as possible, you should stay in a different room from other people in your home. Also, you should use a separate bathroom, if available.  Avoid sharing household items You should not share dishes,  drinking glasses, cups, eating utensils, towels, bedding, or other items with other people in your home. After using these items, you should wash them thoroughly with soap and water.  Cover your coughs and sneezes Cover your mouth and nose with a tissue when you cough or sneeze, or you can cough or sneeze into your sleeve. Throw used tissues in a lined trash can, and immediately wash your hands with soap and water for at least 20 seconds or use an alcohol-based hand rub.  Wash your [employer] your hands often and thoroughly with soap and water for at least 20 seconds. You can use an alcohol-based hand sanitizer if soap and water are not available and if your hands are not visibly dirty. Avoid touching your eyes, nose, and mouth with unwashed hands.   Prevention Steps for Caregivers and Household Members of Individuals Confirmed to have, or Being Evaluated for, COVID-19 Infection Being Cared for in the Home  If you live with, or provide care at home for, a person confirmed to have, or being evaluated for, COVID-19 infection please follow these guidelines to prevent infection:  Follow healthcare provider's instructions Make sure that you understand and can help the patient follow any healthcare provider instructions for all care.  Provide for the patient's basic needs You should help the patient with basic needs in the home and provide support for getting groceries, prescriptions, and other personal needs.  Monitor the patient's symptoms If they are getting sicker, call his or her medical provider and tell them that the patient has, or is being evaluated for, COVID-19 infection. This will help the healthcare provider's office take  steps to keep other people from getting infected. Ask the healthcare provider to call the local or state health department.  Limit the number of people who have contact with the patient If possible, have only one caregiver for the patient. Other household  members should stay in another home or place of residence. If this is not possible, they should stay in another room, or be separated from the patient as much as possible. Use a separate bathroom, if available. Restrict visitors who do not have an essential need to be in the home.  Keep older adults, very young children, and other sick people away from the patient Keep older adults, very young children, and those who have compromised immune systems or chronic health conditions away from the patient. This includes people with chronic heart, lung, or kidney conditions, diabetes, and cancer.  Ensure good ventilation Make sure that shared spaces in the home have good air flow, such as from an air conditioner or an opened window, weather permitting.  Wash your hands often Wash your hands often and thoroughly with soap and water for at least 20 seconds. You can use an alcohol based hand sanitizer if soap and water are not available and if your hands are not visibly dirty. Avoid touching your eyes, nose, and mouth with unwashed hands. Use disposable paper towels to dry your hands. If not available, use dedicated cloth towels and replace them when they become wet.  Wear a facemask and gloves Wear a disposable facemask at all times in the room and gloves when you touch or have contact with the patient's blood, body fluids, and/or secretions or excretions, such as sweat, saliva, sputum, nasal mucus, vomit, urine, or feces.  Ensure the mask fits over your nose and mouth tightly, and do not touch it during use. Throw out disposable facemasks and gloves after using them. Do not reuse. Wash your hands immediately after removing your facemask and gloves. If your personal clothing becomes contaminated, carefully remove clothing and launder. Wash your hands after handling contaminated clothing. Place all used disposable facemasks, gloves, and other waste in a lined container before disposing them with other  household waste. Remove gloves and wash your hands immediately after handling these items.  Do not share dishes, glasses, or other household items with the patient Avoid sharing household items. You should not share dishes, drinking glasses, cups, eating utensils, towels, bedding, or other items with a patient who is confirmed to have, or being evaluated for, COVID-19 infection. After the person uses these items, you should wash them thoroughly with soap and water.  Wash laundry thoroughly Immediately remove and wash clothes or bedding that have blood, body fluids, and/or secretions or excretions, such as sweat, saliva, sputum, nasal mucus, vomit, urine, or feces, on them. Wear gloves when handling laundry from the patient. Read and follow directions on labels of laundry or clothing items and detergent. In general, wash and dry with the warmest temperatures recommended on the label.  Clean all areas the individual has used often Clean all touchable surfaces, such as counters, tabletops, doorknobs, bathroom fixtures, toilets, phones, keyboards, tablets, and bedside tables, every day. Also, clean any surfaces that may have blood, body fluids, and/or secretions or excretions on them. Wear gloves when cleaning surfaces the patient has come in contact with. Use a diluted bleach solution (e.g., dilute bleach with 1 part bleach and 10 parts water) or a household disinfectant with a label that says EPA-registered for coronaviruses. To make a bleach solution  at home, add 1 tablespoon of bleach to 1 quart (4 cups) of water. For a larger supply, add  cup of bleach to 1 gallon (16 cups) of water. Read labels of cleaning products and follow recommendations provided on product labels. Labels contain instructions for safe and effective use of the cleaning product including precautions you should take when applying the product, such as wearing gloves or eye protection and making sure you have good ventilation  during use of the product. Remove gloves and wash hands immediately after cleaning.  Monitor yourself for signs and symptoms of illness Caregivers and household members are considered close contacts, should monitor their health, and will be asked to limit movement outside of the home to the extent possible. Follow the monitoring steps for close contacts listed on the symptom monitoring form.   ? If you have additional questions, contact your local health department or call the epidemiologist on call at [phone] (available 24/7). ? This guidance is subject to change. For the most up-to-date guidance from [facility], please refer to their website: YouBlogs.pl

## 2019-05-18 LAB — NOVEL CORONAVIRUS, NAA (HOSP ORDER, SEND-OUT TO REF LAB; TAT 18-24 HRS): SARS-CoV-2, NAA: NOT DETECTED

## 2019-05-29 ENCOUNTER — Ambulatory Visit
Admission: EM | Admit: 2019-05-29 | Discharge: 2019-05-29 | Disposition: A | Discharge status: Home / Self Care | Payer: BC Managed Care – PPO | Attending: Emergency Medicine | Admitting: Emergency Medicine

## 2019-05-29 ENCOUNTER — Other Ambulatory Visit: Payer: Self-pay

## 2019-05-29 ENCOUNTER — Encounter: Payer: Self-pay | Admitting: Emergency Medicine

## 2019-05-29 DIAGNOSIS — Z20828 Contact with and (suspected) exposure to other viral communicable diseases: Secondary | ICD-10-CM | POA: Diagnosis not present

## 2019-05-29 DIAGNOSIS — R0981 Nasal congestion: Secondary | ICD-10-CM

## 2019-05-29 DIAGNOSIS — K219 Gastro-esophageal reflux disease without esophagitis: Secondary | ICD-10-CM | POA: Diagnosis not present

## 2019-05-29 DIAGNOSIS — Z20822 Contact with and (suspected) exposure to covid-19: Secondary | ICD-10-CM

## 2019-05-29 MED ORDER — PANTOPRAZOLE SODIUM 20 MG PO TBEC: 20.0000 mg | DELAYED_RELEASE_TABLET | Freq: Every day | ORAL | 0 refills | Status: DC

## 2019-05-29 NOTE — ED Provider Notes (Signed)
[name]    CSN: [id number] Arrival date & time: 05/29/19  1719      History   Chief Complaint Chief Complaint  Patient presents with  . Nasal Congestion    HPI Jordan [name] is a 44 y.o. male without significant documented medical history presenting for Covid testing due to persistent, chronic upper respiratory symptoms.  Patient has been seen 5 times since 03/04/2019.  Most recently seen 05/17/2019, please see that note which was reviewed by me at time of appointment.  Patient treated for viral URI supportively with Singulair, benzonatate, Flonase, albuterol inhaler.  Patient states he only filled Flonase, albuterol.  Using albuterol 3 times daily with relief of SOB/chest tightness (worse when going from inside to outside/hot to cold).  Patient states he use Flonase 3 days in a row and developed dry/raw sensation with scant dried blood on tissue paper; has discontinued use since then.  Of note, patient scheduled to see allergy/asthma clinic 06/10/2019: Intends to keep this appointment.  Denies new exposure to Covid, change in overall status.  Patient does note reflux has been acting up.  Used to take a medication for this, though cannot remember name.   History reviewed. No pertinent past medical history.  There are no problems to display for this patient.   History reviewed. No pertinent surgical history.     Home Medications    Prior to Admission medications   Medication Sig Start Date End Date Taking? Authorizing Provider  benzonatate (TESSALON PERLES) 100 MG capsule Take 1 capsule (100 mg total) by mouth 3 (three) times daily as needed for cough. 05/17/19   Scot Jun, FNP  cetirizine (ZYRTEC ALLERGY) 10 MG tablet Take 1 tablet (10 mg total) by mouth daily. 03/22/19   Jaynee Eagles, PA-C  fluticasone (FLONASE) 50 MCG/ACT nasal spray Place 2 sprays into both nostrils daily. 05/17/19   Scot Jun, FNP  loperamide (IMODIUM) 2 MG capsule Take 1 capsule  (2 mg total) by mouth daily as needed for diarrhea or loose stools. 01/11/19   Jaynee Eagles, PA-C  meloxicam (MOBIC) 7.5 MG tablet Take 1 tablet (7.5 mg total) by mouth daily. 01/11/19   Jaynee Eagles, PA-C  montelukast (SINGULAIR) 10 MG tablet Take 1 tablet (10 mg total) by mouth at bedtime. 03/22/19   Jaynee Eagles, PA-C  pantoprazole (PROTONIX) 20 MG tablet Take 1 tablet (20 mg total) by mouth daily. 05/29/19   Hall-Potvin, [place], PA-C  promethazine-dextromethorphan (PROMETHAZINE-DM) 6.25-15 MG/5ML syrup Take 5 mLs by mouth 4 (four) times daily as needed for cough. 04/26/19   Enrique Sack, FNP  amLODipine (NORVASC) 5 MG tablet Take 1 tablet (5 mg total) by mouth daily. 03/22/19 04/26/19  Jaynee Eagles, PA-C    Family History Family History  Problem Relation Age of Onset  . Healthy Mother   . Healthy Father     Social History Social History   Tobacco Use  . Smoking status: Never Smoker  . Smokeless tobacco: Never Used  Substance Use Topics  . Alcohol use: Yes    Comment: occ  . Drug use: No     Allergies   Patient has no known allergies.   Review of Systems Review of Systems  Constitutional: Negative for activity change, appetite change, fatigue and fever.  HENT: Positive for postnasal drip. Negative for congestion, dental problem, ear pain, facial swelling, hearing loss, sinus pain, sore throat, trouble swallowing and voice change.        Positive for dry nose  Eyes: Negative for photophobia, pain and visual disturbance.  Respiratory: Positive for cough and chest tightness. Negative for shortness of breath.        Intermittent, dry, nonproductive and without blood.  Chest tightness alleviated by inhaler  Cardiovascular: Negative for chest pain and palpitations.  Gastrointestinal: Negative for abdominal pain, constipation, diarrhea and vomiting.       Positive for reflux  Musculoskeletal: Negative for arthralgias and myalgias.  Skin: Negative for rash and wound.    Neurological: Negative for dizziness and headaches.     Physical Exam Triage Vital Signs ED Triage Vitals  Enc Vitals Group     BP 05/29/19 1734 (!) 134/91     Pulse Rate 05/29/19 1734 84     Resp 05/29/19 1734 16     Temp 05/29/19 1734 98.4 F (36.9 C)     Temp Source 05/29/19 1734 Oral     SpO2 05/29/19 1734 97 %     Weight --      Height --      Head Circumference --      Peak Flow --      Pain Score 05/29/19 1732 0     Pain Loc --      Pain Edu? --      Excl. in GC? --    No data found.  Updated Vital Signs BP (!) 134/91 (BP Location: Left Arm)   Pulse 84   Temp 98.4 F (36.9 C) (Oral)   Resp 16   SpO2 97%   Visual Acuity Right Eye Distance:   Left Eye Distance:   Bilateral Distance:    Right Eye Near:   Left Eye Near:    Bilateral Near:     Physical Exam Constitutional:      General: He is not in acute distress.    Appearance: Normal appearance. He is not ill-appearing.  HENT:     Head: Normocephalic and atraumatic.     Mouth/Throat:     Mouth: Mucous membranes are moist.     Pharynx: Oropharynx is clear.  Eyes:     General: No scleral icterus.    Conjunctiva/sclera: Conjunctivae normal.     Pupils: Pupils are equal, round, and reactive to light.  Cardiovascular:     Rate and Rhythm: Normal rate and regular rhythm.  Pulmonary:     Effort: Pulmonary effort is normal. No respiratory distress.     Breath sounds: No stridor. No wheezing, rhonchi or rales.     Comments: Good air entry bilaterally without prolonged expiratory phase Chest:     Chest wall: No tenderness.  Abdominal:     General: Bowel sounds are normal.     Tenderness: There is no abdominal tenderness.  Musculoskeletal:     Cervical back: Neck supple. No tenderness.  Lymphadenopathy:     Cervical: No cervical adenopathy.  Skin:    Capillary Refill: Capillary refill takes less than 2 seconds.     Coloration: Skin is not jaundiced or pale.  Neurological:     Mental Status: He is  alert and oriented to person, place, and time.      UC Treatments / Results  Labs (all labs ordered are listed, but only abnormal results are displayed) Labs Reviewed  NOVEL CORONAVIRUS, NAA    EKG   Radiology No results found.  Procedures Procedures (including critical care time)  Medications Ordered in UC Medications - No data to display  Initial Impression / Assessment and Plan / UC Course  I have reviewed the triage vital signs and the nursing notes.  Pertinent labs & imaging results that were available during my care of the patient were reviewed by me and considered in my medical decision making (see chart for details).     Patient afebrile, nontoxic, with SpO2 97%.  Reviewed case with Dr. Leonides Grills who agrees with A/P: chronic issue requiring further work-up, has specialty appointment pending.  Covid PCR pending though due to numerous repeat negative tests without change in symptomatology, known sick contacts, I feel safe for patient to be able to return to work next week.  Short-term work note provided.  We will continue current treatment plan, add humidifier, nasal saline gel, Protonix for additional symptom relief.  Return precautions discussed, patient verbalized understanding and is agreeable to plan. Final Clinical Impressions(s) / UC Diagnoses   Final diagnoses:  Nasal congestion     Discharge Instructions     Use humidifier, saline nasal gel, drink more water, and continue inhaler use. Take protonix every day, try avoiding/decreasing fast food as this can make symptoms worse. Return for worsening symptoms, fever, shortness of breath.    ED Prescriptions    Medication Sig Dispense Auth. Provider   pantoprazole (PROTONIX) 20 MG tablet Take 1 tablet (20 mg total) by mouth daily. 30 tablet Hall-Potvin, [place], PA-C     PDMP not reviewed this encounter.   Odette Fraction [place], [place] 05/29/19 1844

## 2019-05-29 NOTE — ED Notes (Signed)
Patient able to ambulate independently  

## 2019-05-29 NOTE — Discharge Instructions (Addendum)
Use humidifier, saline nasal gel, drink more water, and continue inhaler use. Take protonix every day, try avoiding/decreasing fast food as this can make symptoms worse. Return for worsening symptoms, fever, shortness of breath.

## 2019-05-29 NOTE — ED Triage Notes (Signed)
Pt presents to [facility] for assessment shortness of breath/chest tightness unrelieved by inhaler.  Pt c/o nasal congestion/drainage and "ear drainage",  Pt c/o congestion in throat.  States getting up in the morning is difficult due to all of the crud.  Patient c/o dried blood in nose when blowing.  Patient states when he goes from inside to inside and back inside he feels like he has to take multiple doses of his inhaler.  Also c/o nausea in the mornings.  Patient states recent positive case on his job but denies direct exposure.      Pt supposed to see specialist on 06/10/19.

## 2019-05-31 LAB — NOVEL CORONAVIRUS, NAA: SARS-CoV-2, NAA: NOT DETECTED

## 2019-06-03 ENCOUNTER — Telehealth: Payer: Self-pay | Admitting: Emergency Medicine

## 2019-06-03 NOTE — Telephone Encounter (Signed)
Attempted to return patient's call about needing an extended work note due to COVID results not back.  Call went straight to voicemail.  COVID results are back and are negative, no need to extend note at this time.  Left voicemail to try to get return call.

## 2019-06-03 NOTE — Telephone Encounter (Signed)
Pt called to receive COVID results.  Confirmed identity using two identifiers and provided negative result.  Pt verbalized understanding.

## 2019-06-10 ENCOUNTER — Ambulatory Visit (INDEPENDENT_AMBULATORY_CARE_PROVIDER_SITE_OTHER): Payer: BC Managed Care – PPO | Admitting: Allergy

## 2019-06-10 ENCOUNTER — Other Ambulatory Visit: Payer: Self-pay

## 2019-06-10 ENCOUNTER — Encounter: Payer: Self-pay | Admitting: Allergy

## 2019-06-10 VITALS — BP 134/94 | HR 78 | Temp 98.3°F | Resp 18 | Ht 69.0 in | Wt 176.0 lb

## 2019-06-10 DIAGNOSIS — J3089 Other allergic rhinitis: Secondary | ICD-10-CM

## 2019-06-10 DIAGNOSIS — H1013 Acute atopic conjunctivitis, bilateral: Secondary | ICD-10-CM | POA: Diagnosis not present

## 2019-06-10 DIAGNOSIS — J4540 Moderate persistent asthma, uncomplicated: Secondary | ICD-10-CM | POA: Diagnosis not present

## 2019-06-10 MED ORDER — MONTELUKAST SODIUM 10 MG PO TABS: 10.0000 mg | ORAL_TABLET | Freq: Every day | ORAL | 1 refills | Status: DC

## 2019-06-10 MED ORDER — BECLOMETHASONE DIPROPIONATE 80 MCG/ACT IN AERS: 2.0000 | INHALATION_SPRAY | Freq: Two times a day (BID) | RESPIRATORY_TRACT | 1 refills | Status: DC

## 2019-06-10 MED ORDER — IPRATROPIUM BROMIDE 0.06 % NA SOLN: 2.0000 | Freq: Three times a day (TID) | NASAL | 1 refills | Status: DC

## 2019-06-10 NOTE — Progress Notes (Signed)
New Patient Note  RE: Jordan [name] MRN: [id number] DOB: [birth year] Date of Office Visit: 06/10/2019  Referring provider: Quincy Sheehan, * Primary care provider: Patient, No Pcp Per  Chief Complaint: allergies  History of present illness: Jordan [name] is a 44 y.o. male presenting today for consultation for allergies.   Jordan [name] has been having nasal/sinus congestion and drainage.  Also reporting red eyes, ear drainage, throat clearing and feels like "everything from my nose to chest is clogged up".  Jordan [name] also reports the drainage is leading to cough and Jordan [name] also felt chest tightness and occasional wheezing.   Jordan [name] reports Jordan [name] has been going back and forth to UC quite a bit lately as Jordan [name] felt Jordan [name] was getting a cold.  Jordan [name] has been given tessalon perles, prednisone, albuterol inhaler, flonase.  A different UC provided him with nasal gel for his nose and recommended use of a vaporizer.   Symptoms ongoing since November 2020.  Jordan [name] had to stop flonase as was causing nosebleeds.  Jordan [name] feels the nasal gel has helped to "open up my nostrils".    Jordan [name] states Jordan [name] seems like the albuterol inhaler helps to "open things up".  Jordan [name] was using the albuterol inhaler daily but has run out several day ago.     Jordan [name] states Jordan [name] had similar symptoms the year prior and ended up getting whooping cough.   Jordan [name] feels Jordan [name] likely had asthma as a child but was never diagnosed.    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  Jordan [name] states with cleaning his house like sweeping Jordan [name] also feels like Jordan [name] develops chest tightening and uses albuterol then as well.   No history of food allergy or eczema.   Jordan [name] does report feeling more throat congestion after Jordan [name] eats in general.  Jordan [name] does report having refluz and has been taking pantoprazole for control.     Jordan [name] has had at least 7 testing done for Covid since August that all have been negative.    Review of systems: Review of Systems    Constitutional: Negative.   HENT: Positive for congestion, ear discharge, nosebleeds and sinus pain.   Eyes: Positive for redness. Negative for pain and discharge.  Respiratory: Positive for cough, shortness of breath and wheezing.   Cardiovascular: Negative.   Gastrointestinal: Negative.   Musculoskeletal: Negative.   Skin: Negative.   Neurological: Negative.     All other systems negative unless noted above in HPI  Past medical history: Past Medical History:  Diagnosis Date   Reflux esophagitis     Past surgical history: Past Surgical History:  Procedure Laterality Date   NO PAST SURGERIES      Family history:  Family History  Problem Relation Age of Onset   Healthy Mother    Healthy Father     Social history: Lives in a home with out carpeting with a gas heating and central cooling.  No pets in the home.  There is concern for water damage or mildew in the home.  No concern for roaches in the home.  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  Jordan [name] denies a smoking history.  Medication List: Current Outpatient Medications  Medication Sig Dispense Refill   beclomethasone (QVAR) 80 MCG/ACT inhaler Inhale 2 puffs into the lungs 2 (two) times daily. 26.1 g 1   benzonatate (TESSALON PERLES) 100 MG capsule Take 1 capsule (100  mg total) by mouth 3 (three) times daily as needed for cough. (Patient not taking: Reported on 06/10/2019) 20 capsule 0   cetirizine (ZYRTEC ALLERGY) 10 MG tablet Take 1 tablet (10 mg total) by mouth daily. (Patient not taking: Reported on 06/10/2019) 90 tablet 0   fluticasone (FLONASE) 50 MCG/ACT nasal spray Place 2 sprays into both nostrils daily. (Patient not taking: Reported on 06/10/2019) 9.9 mL 2   ipratropium (ATROVENT) 0.06 % nasal spray Place 2 sprays into both nostrils 3 (three) times daily. 45 mL 1   loperamide (IMODIUM) 2 MG capsule Take 1 capsule (2 mg total) by mouth daily as needed for diarrhea or loose stools. (Patient not  taking: Reported on 06/10/2019) 10 capsule 0   meloxicam (MOBIC) 7.5 MG tablet Take 1 tablet (7.5 mg total) by mouth daily. (Patient not taking: Reported on 06/10/2019) 30 tablet 0   montelukast (SINGULAIR) 10 MG tablet Take 1 tablet (10 mg total) by mouth at bedtime. 90 tablet 1   pantoprazole (PROTONIX) 20 MG tablet Take 1 tablet (20 mg total) by mouth daily. (Patient not taking: Reported on 06/10/2019) 30 tablet 0   promethazine-dextromethorphan (PROMETHAZINE-DM) 6.25-15 MG/5ML syrup Take 5 mLs by mouth 4 (four) times daily as needed for cough. (Patient not taking: Reported on 06/10/2019) 120 mL 0   No current facility-administered medications for this visit.    Known medication allergies: No Known Allergies   Physical examination: Blood pressure (!) 134/94, pulse 78, temperature 98.3 F (36.8 C), temperature source Temporal, resp. rate 18, height 5\' 9"  (1.753 m), weight 176 lb (79.8 kg), SpO2 99 %.  General: Alert, interactive, in no acute distress. HEENT: PERRLA, TMs pearly gray, turbinates moderately edematous with clear discharge, post-pharynx non erythematous. Neck: Supple without lymphadenopathy. Lungs: Clear to auscultation without wheezing, rhonchi or rales. {no increased work of breathing. CV: Normal S1, S2 without murmurs. Abdomen: Nondistended, nontender. Skin: Warm and dry, without lesions or rashes. Extremities:  No clubbing, cyanosis or edema. Neuro:   Grossly intact.  Diagnositics/Labs:  Spirometry: FEV1: 2.88L 85%, FVC: 3.23L 78% predicted.  FVC is slightly reduced.    Allergy testing: environmental allergy skin prick testing is positive to grasses, trees, molds, dust mites.  Intradermals offered however Jordan [name] declined.   Allergy testing results were read and interpreted by provider, documented by clinical staff.   Assessment and plan:   Allergic rhinitis with conjunctivitis - environmental allergy skin testing today is positive to grasses, trees, molds, dust  mites - allergen avoidance measures discussed/handouts provided - recommend taking long-acting antihistamine like Xyzal 5mg  or Allegra daily and see if this helps with control of allergy based symptoms - resume Singulair 10mg  daily at bedtime.  If you notice any change in mood/behavior/sleep after starting Singulair then stop this medication and let know.  Symptoms resolve after stopping the medication.   - try nasal saline rinses (ie. Netipot) to help flush out the nose and help with moisturization - for control of nasal drainage and congestion try use of nasal Atrovent 2 sprays each nostril twice a day  - allergen immunotherapy discussed today including protocol, benefits and risk.  Informational handout provided.  If interested in this therapuetic option you can check with your insurance carrier for coverage.  Let know if you would like to proceed with this option.    Reactive airway, moderate persistent - have access to albuterol inhaler 2 puffs every 4-6 hours as needed for cough/wheeze/shortness of breath/chest tightness.  May use 15-20 minutes  prior to activity.   Monitor frequency of use.   - recommend starting a daily maintenance inhaler Qvar 2 puffs twice a day to help control breathing symptoms  Control goals:   Full participation in all desired activities (may need albuterol before activity)  Albuterol use two time or less a week on average (not counting use with activity)  Cough interfering with sleep two time or less a month  Oral steroids no more than once a year  No hospitalizations  Follow-up in 3 months or sooner if needed  I appreciate the opportunity to take part in Chetan's care. Please do not hesitate to contact me with questions.  Sincerely,   Margo Aye, MD Allergy/Immunology [facility]

## 2019-06-10 NOTE — Patient Instructions (Addendum)
-   environmental allergy skin testing today is positive to grasses, trees, molds, dust mites - allergen avoidance measures discussed/handouts provided - recommend taking long-acting antihistamine like Xyzal 5mg  or Allegra daily and see if this helps with control of allergy based symptoms - resume Singulair 10mg  daily at bedtime.  If you notice any change in mood/behavior/sleep after starting Singulair then stop this medication and let know.  Symptoms resolve after stopping the medication.   - try nasal saline rinses (ie. Netipot) to help flush out the nose and help with moisturization - for control of nasal drainage and congestion try use of nasal Atrovent 2 sprays each nostril twice a day  - allergen immunotherapy discussed today including protocol, benefits and risk.  Informational handout provided.  If interested in this therapuetic option you can check with your insurance carrier for coverage.  Let know if you would like to proceed with this option.    - have access to albuterol inhaler 2 puffs every 4-6 hours as needed for cough/wheeze/shortness of breath/chest tightness.  May use 15-20 minutes prior to activity.   Monitor frequency of use.   - recommend starting a daily maintenance inhaler Qvar [place] 2 puffs twice a day to help control breathing symptoms  Control goals:   Full participation in all desired activities (may need albuterol before activity)  Albuterol use two time or less a week on average (not counting use with activity)  Cough interfering with sleep two time or less a month  Oral steroids no more than once a year  No hospitalizations  Follow-up in 3 months or sooner if needed

## 2019-06-13 ENCOUNTER — Other Ambulatory Visit: Payer: Self-pay

## 2019-06-13 ENCOUNTER — Ambulatory Visit
Admission: EM | Admit: 2019-06-13 | Discharge: 2019-06-13 | Disposition: A | Discharge status: Home / Self Care | Payer: BC Managed Care – PPO

## 2019-06-13 ENCOUNTER — Encounter: Payer: Self-pay | Admitting: Emergency Medicine

## 2019-06-13 DIAGNOSIS — Z20822 Contact with and (suspected) exposure to covid-19: Secondary | ICD-10-CM

## 2019-06-13 DIAGNOSIS — J399 Disease of upper respiratory tract, unspecified: Secondary | ICD-10-CM

## 2019-06-13 DIAGNOSIS — I10 Essential (primary) hypertension: Secondary | ICD-10-CM

## 2019-06-13 MED ORDER — AMLODIPINE BESYLATE 5 MG PO TABS: 5.0000 mg | ORAL_TABLET | Freq: Every day | ORAL | 0 refills | Status: DC

## 2019-06-13 NOTE — ED Provider Notes (Signed)
[name]    CSN: [id number] Arrival date & time: 06/13/19  1022      History   Chief Complaint Chief Complaint  Patient presents with  . Exposure to COVID    HPI Jordan [name] is a 44 y.o. male with history of allergies presenting for Covid exposure.  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  States his boss then went home, got tested and found he was positive for Covid.  Patient had subjective chills this morning, there are no fever, cough, shortness of breath.  Overall symptoms have been stable: Of note patient has been seen numerous times in urgent care setting for repeat Covid testing due to exposures.  Patient has undergone evaluation by allergist and states medications are being filled by them.   Past Medical History:  Diagnosis Date  . Reflux esophagitis     There are no problems to display for this patient.   Past Surgical History:  Procedure Laterality Date  . NO PAST SURGERIES         Home Medications    Prior to Admission medications   Medication Sig Start Date End Date Taking? Authorizing Provider  amLODipine (NORVASC) 5 MG tablet Take 1 tablet (5 mg total) by mouth daily. 06/13/19   Hall-Potvin, [place], PA-C  beclomethasone (QVAR) 80 MCG/ACT inhaler Inhale 2 puffs into the lungs 2 (two) times daily. 06/10/19   Marcelyn Bruins, MD  ipratropium (ATROVENT) 0.06 % nasal spray Place 2 sprays into both nostrils 3 (three) times daily. 06/10/19   Marcelyn Bruins, MD  loperamide (IMODIUM) 2 MG capsule Take 1 capsule (2 mg total) by mouth daily as needed for diarrhea or loose stools. Patient not taking: Reported on 06/10/2019 01/11/19   Wallis Bamberg, PA-C  meloxicam (MOBIC) 7.5 MG tablet Take 1 tablet (7.5 mg total) by mouth daily. Patient not taking: Reported on 06/10/2019 01/11/19   Wallis Bamberg, PA-C  montelukast (SINGULAIR) 10 MG  tablet Take 1 tablet (10 mg total) by mouth at bedtime. 06/10/19   Marcelyn Bruins, MD  pantoprazole (PROTONIX) 20 MG tablet Take 1 tablet (20 mg total) by mouth daily. Patient not taking: Reported on 06/10/2019 05/29/19   Hall-Potvin, [place], PA-C  cetirizine (ZYRTEC ALLERGY) 10 MG tablet Take 1 tablet (10 mg total) by mouth daily. Patient not taking: Reported on 06/10/2019 03/22/19 06/13/19  Wallis Bamberg, PA-C  fluticasone [facility]) 50 MCG/ACT nasal spray Place 2 sprays into both nostrils daily. Patient not taking: Reported on 06/10/2019 05/17/19 06/13/19  Bing Neighbors, FNP    Family History Family History  Problem Relation Age of Onset  . Healthy Mother   . Healthy Father     Social History Social History   Tobacco Use  . Smoking status: Never Smoker  . Smokeless tobacco: Never Used  Substance Use Topics  . Alcohol use: Yes    Comment: occ  . Drug use: No     Allergies   Patient has no known allergies.   Review of Systems As per HPI   Physical Exam Triage Vital Signs ED Triage Vitals  Enc Vitals Group     BP      Pulse      Resp      Temp      Temp src      SpO2      Weight  Height      Head Circumference      Peak Flow      Pain Score      Pain Loc      Pain Edu?      Excl. in [place]?    No data found.  Updated Vital Signs BP 137/89 (BP Location: Left Arm)   Pulse 70   Temp 98 F (36.7 C) (Temporal)   Resp 18   SpO2 98%   Visual Acuity Right Eye Distance:   Left Eye Distance:   Bilateral Distance:    Right Eye Near:   Left Eye Near:    Bilateral Near:     Physical Exam Constitutional:      General: He is not in acute distress.    Appearance: He is not toxic-appearing or diaphoretic.  HENT:     Head: Normocephalic and atraumatic.     Mouth/Throat:     Mouth: Mucous membranes are moist.     Pharynx: Oropharynx is clear.  Eyes:     General: No scleral icterus.    Conjunctiva/sclera: Conjunctivae normal.     Pupils:  Pupils are equal, round, and reactive to light.  Neck:     Comments: Trachea midline, negative JVD Cardiovascular:     Rate and Rhythm: Normal rate and regular rhythm.  Pulmonary:     Effort: Pulmonary effort is normal. No respiratory distress.     Breath sounds: No wheezing.  Musculoskeletal:     Cervical back: Neck supple. No tenderness.  Lymphadenopathy:     Cervical: No cervical adenopathy.  Skin:    Capillary Refill: Capillary refill takes less than 2 seconds.     Coloration: Skin is not jaundiced or pale.     Findings: No rash.  Neurological:     Mental Status: He is alert and oriented to person, place, and time.      UC Treatments / Results  Labs (all labs ordered are listed, but only abnormal results are displayed) Labs Reviewed - No data to display  EKG   Radiology No results found.  Procedures Procedures (including critical care time)  Medications Ordered in UC Medications - No data to display  Initial Impression / Assessment and Plan / UC Course  I have reviewed the triage vital signs and the nursing notes.  Pertinent labs & imaging results that were available during my care of the patient were reviewed by me and considered in my medical decision making (see chart for details).     Patient afebrile, nontoxic, with SpO2 98%.  Reviewed that patient does not meet recommended guidelines for Covid testing at this time due to lack of symptoms, fever without high risk exposur that was less than 5 to 7 days prior to being seen today.  Reviewed appropriate guidelines for Covid testing in the future.  Patient requesting refill of amlodipine: Sent.  Stressed importance of following up with PCP for further evaluation/management of chronic conditions.  Return precautions discussed, patient verbalized understanding and is agreeable to plan. Final Clinical Impressions(s) / UC Diagnoses   Final diagnoses:  Exposure to COVID-19 virus  Essential hypertension, benign      Discharge Instructions     Recommend waiting at least 5-7 days after exposure before testing. May test sooner if you develop severe cough, difficulty breathing, or fever (>100F). Very important to follow up with family doc (please call their office Tues to schedule appointment).    ED Prescriptions    Medication Sig Dispense Auth.  Provider   amLODipine (NORVASC) 5 MG tablet Take 1 tablet (5 mg total) by mouth daily. 30 tablet Hall-Potvin, [place], PA-C     PDMP not reviewed this encounter.   Hall-Potvin, [place], [place] 06/13/19 1302

## 2019-06-13 NOTE — ED Triage Notes (Addendum)
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  Patient states his boss was not wearing his mask correctly but the patient he wore his.  Patient states he has had his "asthma" symptoms, and c/o chills this morning.  PT states he needs a refill on his BP medication as well, believe it is Amlodipine, added to med list.

## 2019-06-13 NOTE — Discharge Instructions (Addendum)
Recommend waiting at least 5-7 days after exposure before testing. May test sooner if you develop severe cough, difficulty breathing, or fever (>100F). Very important to follow up with family doc (please call their office Tues to schedule appointment).

## 2019-06-13 NOTE — ED Notes (Signed)
Patient able to ambulate independently  

## 2019-06-14 ENCOUNTER — Other Ambulatory Visit: Payer: Self-pay

## 2019-06-14 ENCOUNTER — Encounter (HOSPITAL_COMMUNITY): Payer: Self-pay

## 2019-06-14 ENCOUNTER — Ambulatory Visit (HOSPITAL_COMMUNITY)
Admission: EM | Admit: 2019-06-14 | Discharge: 2019-06-14 | Disposition: A | Discharge status: Home / Self Care | Payer: BC Managed Care – PPO | Attending: Family Medicine | Admitting: Family Medicine

## 2019-06-14 DIAGNOSIS — J399 Disease of upper respiratory tract, unspecified: Secondary | ICD-10-CM | POA: Diagnosis not present

## 2019-06-14 DIAGNOSIS — Z20822 Contact with and (suspected) exposure to covid-19: Secondary | ICD-10-CM | POA: Diagnosis not present

## 2019-06-14 MED ORDER — ALBUTEROL SULFATE HFA 108 (90 BASE) MCG/ACT IN AERS: 1.0000 | INHALATION_SPRAY | Freq: Four times a day (QID) | RESPIRATORY_TRACT | 0 refills | Status: DC | PRN

## 2019-06-14 NOTE — ED Provider Notes (Signed)
[facility]    CSN: [id number] Arrival date & time: 06/14/19  1524      History   Chief Complaint Chief Complaint  Patient presents with  . Appointment    4.00  . COVID Exposure    HPI Jordan [name] is a 44 y.o. male.   Jordan [name] presents with complaints of runny nose, congestion, headache, sore throat, which started two days ago. No known fevers. Body aches. Diarrhea, today 3 episodes, non bloody. No vomiting. Eating and drinking. A coworker tested positive for covid-19, was around them last 4 days ago, found out 3 days ago this person tested positive. Tylenol, last this morning, helped some with headache but not congestion. Using nasal spray, qvar and zyrtec for what appears to be chronic upper respiratory symptoms. Per chart review has had multiple visits and covid testing since October. Last seen yesterday, of which shared with provider he was exposed on 1/15 and it was recommended that he wait at least 5 days following exposure for testing. He has seen asthma and allergy center, last on 1/13.     ROS per HPI, negative if not otherwise mentioned.      Past Medical History:  Diagnosis Date  . Reflux esophagitis     There are no problems to display for this patient.   Past Surgical History:  Procedure Laterality Date  . NO PAST SURGERIES         Home Medications    Prior to Admission medications   Medication Sig Start Date End Date Taking? Authorizing Provider  albuterol (PROAIR HFA) 108 (90 Base) MCG/ACT inhaler Inhale 1-2 puffs into the lungs every 6 (six) hours as needed for wheezing or shortness of breath. 06/14/19   Georgetta Haber, NP  amLODipine (NORVASC) 5 MG tablet Take 1 tablet (5 mg total) by mouth daily. 06/13/19   Hall-Potvin, [place], PA-C  beclomethasone (QVAR) 80 MCG/ACT inhaler Inhale 2 puffs into the lungs 2 (two) times daily. 06/10/19   Marcelyn Bruins, MD  ipratropium (ATROVENT) 0.06 % nasal spray Place 2 sprays into both  nostrils 3 (three) times daily. 06/10/19   Marcelyn Bruins, MD  loperamide (IMODIUM) 2 MG capsule Take 1 capsule (2 mg total) by mouth daily as needed for diarrhea or loose stools. Patient not taking: Reported on 06/10/2019 01/11/19   Wallis Bamberg, PA-C  meloxicam (MOBIC) 7.5 MG tablet Take 1 tablet (7.5 mg total) by mouth daily. Patient not taking: Reported on 06/10/2019 01/11/19   Wallis Bamberg, PA-C  montelukast (SINGULAIR) 10 MG tablet Take 1 tablet (10 mg total) by mouth at bedtime. 06/10/19   Marcelyn Bruins, MD  pantoprazole (PROTONIX) 20 MG tablet Take 1 tablet (20 mg total) by mouth daily. Patient not taking: Reported on 06/10/2019 05/29/19   Hall-Potvin, [place], PA-C  cetirizine (ZYRTEC ALLERGY) 10 MG tablet Take 1 tablet (10 mg total) by mouth daily. Patient not taking: Reported on 06/10/2019 03/22/19 06/13/19  Wallis Bamberg, PA-C  fluticasone [facility]) 50 MCG/ACT nasal spray Place 2 sprays into both nostrils daily. Patient not taking: Reported on 06/10/2019 05/17/19 06/13/19  Bing Neighbors, FNP    Family History Family History  Problem Relation Age of Onset  . Healthy Mother   . Healthy Father     Social History Social History   Tobacco Use  . Smoking status: Never Smoker  . Smokeless tobacco: Never Used  Substance Use Topics  . Alcohol use: Yes    Comment: occ  . Drug  use: No     Allergies   Patient has no known allergies.   Review of Systems Review of Systems   Physical Exam Triage Vital Signs ED Triage Vitals  Enc Vitals Group     BP 06/14/19 1544 (!) 146/86     Pulse Rate 06/14/19 1544 84     Resp 06/14/19 1544 16     Temp 06/14/19 1544 98.1 F (36.7 C)     Temp Source 06/14/19 1544 Oral     SpO2 06/14/19 1544 100 %     Weight --      Height --      Head Circumference --      Peak Flow --      Pain Score 06/14/19 1543 0     Pain Loc --      Pain Edu? --      Excl. in [place]? --    No data found.  Updated Vital Signs BP (!) 146/86  (BP Location: Left Arm)   Pulse 84   Temp 98.1 F (36.7 C) (Oral)   Resp 16   SpO2 100%   Visual Acuity Right Eye Distance:   Left Eye Distance:   Bilateral Distance:    Right Eye Near:   Left Eye Near:    Bilateral Near:     Physical Exam Constitutional:      Appearance: He is well-developed.  Cardiovascular:     Rate and Rhythm: Normal rate.  Pulmonary:     Effort: Pulmonary effort is normal.  Skin:    General: Skin is warm and dry.  Neurological:     Mental Status: He is alert and oriented to person, place, and time.      UC Treatments / Results  Labs (all labs ordered are listed, but only abnormal results are displayed) Labs Reviewed  NOVEL CORONAVIRUS, NAA (HOSP ORDER, SEND-OUT TO REF LAB; TAT 18-24 HRS)    EKG   Radiology No results found.  Procedures Procedures (including critical care time)  Medications Ordered in UC Medications - No data to display  Initial Impression / Assessment and Plan / UC Course  I have reviewed the triage vital signs and the nursing notes.  Pertinent labs & imaging results that were available during my care of the patient were reviewed by me and considered in my medical decision making (see chart for details).     Non toxic. Benign physical exam.  Unfortunately it does appear he has had recurrent/ chronic URI symptoms. New covid exposure per patient, 4 days ago, with pcr collected and pending. Albuterol refilled. Return precautions provided. Encouraged continued follow up with PCP. Patient verbalized understanding and agreeable to plan.   Final Clinical Impressions(s) / UC Diagnoses   Final diagnoses:  Exposure to COVID-19 virus  Upper respiratory disease     Discharge Instructions     Self isolate until covid results are back and negative. Due to time frame of exposure it is still possible to get a false negative. Will notify you by phone of any positive findings. Your negative results will be sent through your  MyChart.     Use of inhaler as needed for wheezing or shortness of breath.   Continue with your previously prescribed medications for your symptoms.     ED Prescriptions    Medication Sig Dispense Auth. Provider   albuterol (PROAIR HFA) 108 (90 Base) MCG/ACT inhaler Inhale 1-2 puffs into the lungs every 6 (six) hours as needed for wheezing or shortness  of breath. 8 g Georgetta Haber, NP     PDMP not reviewed this encounter.   Georgetta Haber, NP 06/14/19 [phone]

## 2019-06-14 NOTE — ED Triage Notes (Signed)
Patient presents to Urgent Care with complaints of needing a rapid covid test since being exposed at work 4 days ago. Patient reports he developed a runny nose and sore throat two days ago.

## 2019-06-14 NOTE — Discharge Instructions (Addendum)
Self isolate until covid results are back and negative. Due to time frame of exposure it is still possible to get a false negative. Will notify you by phone of any positive findings. Your negative results will be sent through your MyChart.     Use of inhaler as needed for wheezing or shortness of breath.   Continue with your previously prescribed medications for your symptoms.

## 2019-06-16 LAB — NOVEL CORONAVIRUS, NAA (HOSP ORDER, SEND-OUT TO REF LAB; TAT 18-24 HRS): SARS-CoV-2, NAA: NOT DETECTED

## 2019-06-25 ENCOUNTER — Ambulatory Visit: Payer: BC Managed Care – PPO | Attending: Internal Medicine

## 2019-06-25 DIAGNOSIS — Z20822 Contact with and (suspected) exposure to covid-19: Secondary | ICD-10-CM

## 2019-06-26 LAB — NOVEL CORONAVIRUS, NAA: SARS-CoV-2, NAA: NOT DETECTED

## 2019-06-29 ENCOUNTER — Telehealth: Payer: Self-pay

## 2019-06-29 ENCOUNTER — Ambulatory Visit
Admission: EM | Admit: 2019-06-29 | Discharge: 2019-06-29 | Disposition: A | Discharge status: Home / Self Care | Payer: BC Managed Care – PPO | Attending: Physician Assistant | Admitting: Physician Assistant

## 2019-06-29 DIAGNOSIS — J0140 Acute pansinusitis, unspecified: Secondary | ICD-10-CM | POA: Diagnosis not present

## 2019-06-29 DIAGNOSIS — B9789 Other viral agents as the cause of diseases classified elsewhere: Secondary | ICD-10-CM | POA: Diagnosis not present

## 2019-06-29 MED ORDER — AMOXICILLIN-POT CLAVULANATE 875-125 MG PO TABS: 1.0000 | ORAL_TABLET | Freq: Two times a day (BID) | ORAL | 0 refills | Status: DC

## 2019-06-29 MED ORDER — PREDNISONE 50 MG PO TABS: 50.0000 mg | ORAL_TABLET | Freq: Every day | ORAL | 0 refills | Status: DC

## 2019-06-29 NOTE — Telephone Encounter (Signed)
can you advise if patient could get a letter regarding his Asthma problems. What do we need to state on the letter.

## 2019-06-29 NOTE — ED Triage Notes (Signed)
Pt c/o cough and headaches for over a week. States had a neg covid test after sx's started. States has been to an Proof[facility] and treated. States the cough, congestion and headache is stilll there. States needs a work note.

## 2019-06-29 NOTE — Telephone Encounter (Signed)
Patient called to see if he could get a letter for work regarding his  Allergies & Asthma. Any time he has any allergy flare symptoms his job makes him go get COVID Tested.   Please Advise

## 2019-06-29 NOTE — ED Provider Notes (Signed)
[name]    CSN: [id number] Arrival date & time: 06/29/19  1446      History   Chief Complaint Chief Complaint  Patient presents with  . Cough    HPI Jordan [name] is a 44 y.o. male.   44 year old male comes in for 10 day of URI symptoms. Cough, sinus pressure, rhinorrhea, nasal congestion. Has had some chills, body aches. Denies fever. Has had shortness of breath that is not completely relieved by inhaler use. Denies abdominal pain, nausea, vomiting, diarrhea. Denies loss of taste/smell. Had negative COVID testing 3 days ago. Has been using inhalers, xyzal, singulair without much relief. States atrovent, flonase with some relief, but started having nose bleeds and therefore discontinued.      Past Medical History:  Diagnosis Date  . Reflux esophagitis     There are no problems to display for this patient.   Past Surgical History:  Procedure Laterality Date  . NO PAST SURGERIES         Home Medications    Prior to Admission medications   Medication Sig Start Date End Date Taking? Authorizing Provider  albuterol (PROAIR HFA) 108 (90 Base) MCG/ACT inhaler Inhale 1-2 puffs into the lungs every 6 (six) hours as needed for wheezing or shortness of breath. 06/14/19   Georgetta Haber, NP  amLODipine (NORVASC) 5 MG tablet Take 1 tablet (5 mg total) by mouth daily. 06/13/19   Hall-Potvin, [place], PA-C  amoxicillin-clavulanate (AUGMENTIN) 875-125 MG tablet Take 1 tablet by mouth every 12 (twelve) hours. 06/29/19   Cathie Hoops, Jadon Harbaugh V, PA-C  beclomethasone (QVAR) 80 MCG/ACT inhaler Inhale 2 puffs into the lungs 2 (two) times daily. 06/10/19   Marcelyn Bruins, MD  ipratropium (ATROVENT) 0.06 % nasal spray Place 2 sprays into both nostrils 3 (three) times daily. 06/10/19   Marcelyn Bruins, MD  loperamide (IMODIUM) 2 MG capsule Take 1 capsule (2 mg total) by mouth daily as needed for diarrhea or loose stools. Patient not taking: Reported on 06/10/2019 01/11/19    Wallis Bamberg, PA-C  meloxicam (MOBIC) 7.5 MG tablet Take 1 tablet (7.5 mg total) by mouth daily. Patient not taking: Reported on 06/10/2019 01/11/19   Wallis Bamberg, PA-C  montelukast (SINGULAIR) 10 MG tablet Take 1 tablet (10 mg total) by mouth at bedtime. 06/10/19   Marcelyn Bruins, MD  pantoprazole (PROTONIX) 20 MG tablet Take 1 tablet (20 mg total) by mouth daily. Patient not taking: Reported on 06/10/2019 05/29/19   Hall-Potvin, [place], PA-C  predniSONE (DELTASONE) 50 MG tablet Take 1 tablet (50 mg total) by mouth daily with breakfast. 06/29/19   Cathie Hoops, Margean Korell V, PA-C  cetirizine (ZYRTEC ALLERGY) 10 MG tablet Take 1 tablet (10 mg total) by mouth daily. Patient not taking: Reported on 06/10/2019 03/22/19 06/13/19  Wallis Bamberg, PA-C  fluticasone [facility]) 50 MCG/ACT nasal spray Place 2 sprays into both nostrils daily. Patient not taking: Reported on 06/10/2019 05/17/19 06/13/19  Bing Neighbors, FNP    Family History Family History  Problem Relation Age of Onset  . Healthy Mother   . Healthy Father     Social History Social History   Tobacco Use  . Smoking status: Never Smoker  . Smokeless tobacco: Never Used  Substance Use Topics  . Alcohol use: Yes    Comment: occ  . Drug use: No     Allergies   Patient has no known allergies.   Review of Systems Review of Systems  Reason unable to  perform ROS: See HPI as above.     Physical Exam Triage Vital Signs ED Triage Vitals  Enc Vitals Group     BP 06/29/19 1523 (!) 163/104     Pulse Rate 06/29/19 1523 83     Resp 06/29/19 1523 18     Temp 06/29/19 1523 98.5 F (36.9 C)     Temp Source 06/29/19 1523 Oral     SpO2 06/29/19 1523 97 %     Weight --      Height --      Head Circumference --      Peak Flow --      Pain Score 06/29/19 1524 0     Pain Loc --      Pain Edu? --      Excl. in [place]? --    No data found.  Updated Vital Signs BP (!) 163/104 (BP Location: Left Arm)   Pulse 83   Temp 98.5 F (36.9 C) (Oral)    Resp 18   SpO2 97%   Physical Exam Constitutional:      General: He is not in acute distress.    Appearance: Normal appearance. He is not ill-appearing, toxic-appearing or diaphoretic.  HENT:     Head: Normocephalic and atraumatic.     Right Ear: Ear canal and external ear normal. Tympanic membrane is erythematous. Tympanic membrane is not bulging.     Left Ear: Ear canal and external ear normal. Tympanic membrane is erythematous. Tympanic membrane is not bulging.     Nose:     Left Nostril: No epistaxis.     Right Sinus: Maxillary sinus tenderness and frontal sinus tenderness present.     Left Sinus: Maxillary sinus tenderness and frontal sinus tenderness present.     Mouth/Throat:     Mouth: Mucous membranes are moist.     Pharynx: Oropharynx is clear. Uvula midline.  Cardiovascular:     Rate and Rhythm: Normal rate and regular rhythm.     Heart sounds: Normal heart sounds. No murmur. No friction rub. No gallop.   Pulmonary:     Effort: Pulmonary effort is normal. No accessory muscle usage, prolonged expiration, respiratory distress or retractions.     Comments: Lungs clear to auscultation without adventitious lung sounds. Musculoskeletal:     Cervical back: Normal range of motion and neck supple.  Neurological:     General: No focal deficit present.     Mental Status: He is alert and oriented to person, place, and time.      UC Treatments / Results  Labs (all labs ordered are listed, but only abnormal results are displayed) Labs Reviewed - No data to display  EKG   Radiology No results found.  Procedures Procedures (including critical care time)  Medications Ordered in UC Medications - No data to display  Initial Impression / Assessment and Plan / UC Course  I have reviewed the triage vital signs and the nursing notes.  Pertinent labs & imaging results that were available during my care of the patient were reviewed by me and considered in my medical decision  making (see chart for details).    Augmentin for sinusitis. Prednisone for sinus pressure. Other symptomatic treatment discussed. Return precautions given.   Final Clinical Impressions(s) / UC Diagnoses   Final diagnoses:  Acute non-recurrent pansinusitis    ED Prescriptions    Medication Sig Dispense Auth. Provider   amoxicillin-clavulanate (AUGMENTIN) 875-125 MG tablet Take 1 tablet by mouth every 12 (twelve)  hours. 14 tablet Mosiah Bastin V, PA-C   predniSONE (DELTASONE) 50 MG tablet Take 1 tablet (50 mg total) by mouth daily with breakfast. 5 tablet Ok [place], PA-C     PDMP not reviewed this encounter.   Ok [place], PA-C 06/29/19 1627

## 2019-06-29 NOTE — Discharge Instructions (Addendum)
Since COVID is negative, not worried for COVID. This could have started as allergy or another viral illness. Now having inflammation and sinus infection. Start augmentin as directed. Prednisone as directed. Continue flonase/atrovent as tolerated. Continue xyzal, singular for allergies. If symptoms improving, but not completely resolved, follow up with PCP/allergist for further evaluation. If worsening symptoms, significant shortness of breath, chest pain, go to the emergency department for further evaluation needed.

## 2019-06-30 NOTE — Telephone Encounter (Signed)
We can provide with the following letter however I can not determine what his job will or will not do regarding his symptoms as they can overlap Covid symptoms.     --------------------------------------------------------------------------------- To Whom it May Concern:   (insert pt name) is a patient of mine with allergic rhinitis with conjunctivitis as well as moderate persistent reactive airway disease at the [facility].  Due to these atopic conditions he may exhibit symptoms of allergic rhinitis with conjunctivitis that can include itchy/watery/red eyes, sneezing, nasal congestion or drainage and/or generalized itching or hives.  These symptoms can improve with use of antihistamines, nasal sprays and/or allergy based eye drops.  With reactive airway disease if triggered he can exhibit symptoms of cough, wheeze, chest tightness and/or shortness of breath that should be relieved or improved with use of albuterol.    Symptoms of fever, muscle or body aches, nausea, vomiting, diarrhea, loss of taste are not typically seen in allergic rhinitis with conjunctivitis or reactive airway disease.     Sincerely,     Margo Aye, MD [facility]

## 2019-06-30 NOTE — Telephone Encounter (Signed)
Letter has been generated and has been faxed to [place] to have Dr. Delorse Lek sign and fax back to [facility]. Will call patient once I receive signed letter back through fax.

## 2019-07-01 NOTE — Telephone Encounter (Signed)
Letter has been signed. Called patient and advised, patient verbalized understanding and would like letter to be mailed to his home. Letter has been placed in the mail.

## 2019-07-06 ENCOUNTER — Other Ambulatory Visit: Payer: Self-pay

## 2019-07-06 ENCOUNTER — Ambulatory Visit
Admission: EM | Admit: 2019-07-06 | Discharge: 2019-07-06 | Disposition: A | Discharge status: Home / Self Care | Payer: BC Managed Care – PPO | Attending: Emergency Medicine | Admitting: Emergency Medicine

## 2019-07-06 ENCOUNTER — Encounter: Payer: Self-pay | Admitting: Emergency Medicine

## 2019-07-06 ENCOUNTER — Telehealth: Payer: Self-pay

## 2019-07-06 DIAGNOSIS — Z9109 Other allergy status, other than to drugs and biological substances: Secondary | ICD-10-CM | POA: Diagnosis not present

## 2019-07-06 DIAGNOSIS — Z711 Person with feared health complaint in whom no diagnosis is made: Secondary | ICD-10-CM | POA: Diagnosis not present

## 2019-07-06 MED ORDER — AEROCHAMBER PLUS FLO-VU MEDIUM MISC
1.0000 | Freq: Once | Status: AC
  Administered 2019-07-06: 16:00:00 1

## 2019-07-06 NOTE — Discharge Instructions (Addendum)
Important to keep appointments with allergy/asthma Very important to get an appointment with a PCP for routine health screenings

## 2019-07-06 NOTE — Telephone Encounter (Signed)
Patient is calling to request a letter for work saying that he is taking medications (antihistamines and rescue inhaler) due to his allergies and asthma. He verbalizes that his job requires this because they keep sending him for COVID testing due to his symptoms.

## 2019-07-06 NOTE — ED Triage Notes (Signed)
Pt presents to [facility] for assessment of wheezing yesterday and today.  Pt states when he uses his inhaler it doesn't seem as effective.

## 2019-07-06 NOTE — ED Provider Notes (Signed)
[name]    CSN: [id number] Arrival date & time: 07/06/19  1529      History   Chief Complaint Chief Complaint  Patient presents with  . Wheezing    HPI Jordan [name] is a 44 y.o. male with history of allergies, asthma presenting for wheezing that occurred yesterday and today.  Patient states that he had to leave work due to this.  Denies fever, cough, shortness of breath, chest pain.  No change in sick contacts.  States he uses albuterol inhaler which did not seem as effective is normal.  Overall, patient feels well in office.    Past Medical History:  Diagnosis Date  . Asthma   . Reflux esophagitis     There are no problems to display for this patient.   Past Surgical History:  Procedure Laterality Date  . NO PAST SURGERIES         Home Medications    Prior to Admission medications   Medication Sig Start Date End Date Taking? Authorizing Provider  albuterol (PROAIR HFA) 108 (90 Base) MCG/ACT inhaler Inhale 1-2 puffs into the lungs every 6 (six) hours as needed for wheezing or shortness of breath. 06/14/19   Zigmund Gottron, NP  amLODipine (NORVASC) 5 MG tablet Take 1 tablet (5 mg total) by mouth daily. 06/13/19   Hall-Potvin, [place], PA-C  amoxicillin-clavulanate (AUGMENTIN) 875-125 MG tablet Take 1 tablet by mouth every 12 (twelve) hours. 06/29/19   Tasia Catchings, Amy V, PA-C  beclomethasone (QVAR) 80 MCG/ACT inhaler Inhale 2 puffs into the lungs 2 (two) times daily. 06/10/19   Kennith Gain, MD  ipratropium (ATROVENT) 0.06 % nasal spray Place 2 sprays into both nostrils 3 (three) times daily. 06/10/19   Kennith Gain, MD  loperamide (IMODIUM) 2 MG capsule Take 1 capsule (2 mg total) by mouth daily as needed for diarrhea or loose stools. Patient not taking: Reported on 06/10/2019 01/11/19   Jaynee Eagles, PA-C  meloxicam (MOBIC) 7.5 MG tablet Take 1 tablet (7.5 mg total) by mouth daily. Patient not taking: Reported on 06/10/2019 01/11/19   Jaynee Eagles, PA-C  montelukast (SINGULAIR) 10 MG tablet Take 1 tablet (10 mg total) by mouth at bedtime. 06/10/19   Kennith Gain, MD  pantoprazole (PROTONIX) 20 MG tablet Take 1 tablet (20 mg total) by mouth daily. Patient not taking: Reported on 06/10/2019 05/29/19   Hall-Potvin, [place], PA-C  predniSONE (DELTASONE) 50 MG tablet Take 1 tablet (50 mg total) by mouth daily with breakfast. 06/29/19   Tasia Catchings, Amy V, PA-C  cetirizine (ZYRTEC ALLERGY) 10 MG tablet Take 1 tablet (10 mg total) by mouth daily. Patient not taking: Reported on 06/10/2019 03/22/19 06/13/19  Jaynee Eagles, PA-C  fluticasone [facility]) 50 MCG/ACT nasal spray Place 2 sprays into both nostrils daily. Patient not taking: Reported on 06/10/2019 05/17/19 06/13/19  Scot Jun, FNP    Family History Family History  Problem Relation Age of Onset  . Healthy Mother   . Healthy Father     Social History Social History   Tobacco Use  . Smoking status: Never Smoker  . Smokeless tobacco: Never Used  Substance Use Topics  . Alcohol use: Yes    Comment: occ  . Drug use: No     Allergies   Patient has no known allergies.   Review of Systems As per HPI   Physical Exam Triage Vital Signs ED Triage Vitals  Enc Vitals Group     BP  Pulse      Resp      Temp      Temp src      SpO2      Weight      Height      Head Circumference      Peak Flow      Pain Score      Pain Loc      Pain Edu?      Excl. in GC?    No data found.  Updated Vital Signs BP 133/88 (BP Location: Left Arm)   Pulse 89   Temp 98 F (36.7 C) (Temporal)   Resp 18   SpO2 96%   Visual Acuity Right Eye Distance:   Left Eye Distance:   Bilateral Distance:    Right Eye Near:   Left Eye Near:    Bilateral Near:     Physical Exam Constitutional:      General: He is not in acute distress.    Appearance: He is not toxic-appearing or diaphoretic.  HENT:     Head: Normocephalic and atraumatic.     Mouth/Throat:     Mouth:  Mucous membranes are moist.     Pharynx: Oropharynx is clear.  Eyes:     General: No scleral icterus.    Conjunctiva/sclera: Conjunctivae normal.     Pupils: Pupils are equal, round, and reactive to light.  Neck:     Comments: Trachea midline, negative JVD Cardiovascular:     Rate and Rhythm: Normal rate and regular rhythm.  Pulmonary:     Effort: Pulmonary effort is normal. No respiratory distress.     Breath sounds: No wheezing.  Musculoskeletal:     Cervical back: Neck supple. No tenderness.  Lymphadenopathy:     Cervical: No cervical adenopathy.  Skin:    Capillary Refill: Capillary refill takes less than 2 seconds.     Coloration: Skin is not jaundiced or pale.     Findings: No rash.  Neurological:     Mental Status: He is alert and oriented to person, place, and time.      UC Treatments / Results  Labs (all labs ordered are listed, but only abnormal results are displayed) Labs Reviewed - No data to display  EKG   Radiology No results found.  Procedures Procedures (including critical care time)  Medications Ordered in UC Medications  AeroChamber Plus Flo-Vu Medium MISC 1 each (1 each Other Given 07/06/19 1606)    Initial Impression / Assessment and Plan / UC Course  I have reviewed the triage vital signs and the nursing notes.  Pertinent labs & imaging results that were available during my care of the patient were reviewed by me and considered in my medical decision making (see chart for details).     Patient afebrile, nontoxic in office today.  No signs of acute respiratory distress with SpO2 greater than 96% throughout visit.  No focal cardiopulmonary findings on exam: CXR deferred.  Patient is well-known to this clinic: Has been treated numerous time for upper respiratory, allergy, asthma symptoms.  Last treated for acute nonrecurrent pansinusitis on 06/29/2019: Had negative Covid test 3 days prior to in person evaluation at that time-completed prednisone  course, still has one more day of Augmentin, which helped with sinus symptoms, but "did not do anything for my chest".  Provided AeroChamber spacing device and had patient demonstrate proper inhaler use to my satisfaction.  Will use AeroChamber spacing device with inhaler moving forward, continue all  home medications and specialty follow-up.  Patient requesting work note: instructed patient to return to work tomorrow as he is afebrile, appears well in office, and patient is allowed to have rescue inhaler on hand as needed.  Further work-related restrictions will need to be written by specialist who is following him for these chronic conditions.  Return precautions discussed, patient verbalized understanding and is agreeable to plan.  Final Clinical Impressions(s) / UC Diagnoses   Final diagnoses:  Worried well  Environmental allergies     Discharge Instructions     Important to keep appointments with allergy/asthma Very important to get an appointment with a PCP for routine health screenings    ED Prescriptions    None     PDMP not reviewed this encounter.   Hall-Potvin, [place], [place] 07/07/19 1505

## 2019-07-06 NOTE — ED Notes (Signed)
Patient able to ambulate independently  

## 2019-07-07 ENCOUNTER — Ambulatory Visit (HOSPITAL_COMMUNITY)
Admission: EM | Admit: 2019-07-07 | Discharge: 2019-07-07 | Disposition: A | Discharge status: Home / Self Care | Payer: BC Managed Care – PPO | Attending: Family Medicine | Admitting: Family Medicine

## 2019-07-07 ENCOUNTER — Ambulatory Visit (INDEPENDENT_AMBULATORY_CARE_PROVIDER_SITE_OTHER): Payer: BC Managed Care – PPO

## 2019-07-07 ENCOUNTER — Encounter (HOSPITAL_COMMUNITY): Payer: Self-pay

## 2019-07-07 ENCOUNTER — Encounter: Payer: Self-pay | Admitting: Allergy

## 2019-07-07 ENCOUNTER — Other Ambulatory Visit: Payer: Self-pay

## 2019-07-07 DIAGNOSIS — Z7951 Long term (current) use of inhaled steroids: Secondary | ICD-10-CM | POA: Insufficient documentation

## 2019-07-07 DIAGNOSIS — Z7952 Long term (current) use of systemic steroids: Secondary | ICD-10-CM | POA: Insufficient documentation

## 2019-07-07 DIAGNOSIS — J4540 Moderate persistent asthma, uncomplicated: Secondary | ICD-10-CM | POA: Diagnosis not present

## 2019-07-07 DIAGNOSIS — R0602 Shortness of breath: Secondary | ICD-10-CM

## 2019-07-07 DIAGNOSIS — J4520 Mild intermittent asthma, uncomplicated: Secondary | ICD-10-CM | POA: Diagnosis not present

## 2019-07-07 DIAGNOSIS — Z79899 Other long term (current) drug therapy: Secondary | ICD-10-CM | POA: Insufficient documentation

## 2019-07-07 DIAGNOSIS — K2100 Gastro-esophageal reflux disease with esophagitis, without bleeding: Secondary | ICD-10-CM | POA: Diagnosis not present

## 2019-07-07 DIAGNOSIS — Z792 Long term (current) use of antibiotics: Secondary | ICD-10-CM | POA: Insufficient documentation

## 2019-07-07 DIAGNOSIS — Z20822 Contact with and (suspected) exposure to covid-19: Secondary | ICD-10-CM | POA: Insufficient documentation

## 2019-07-07 HISTORY — DX: Unspecified asthma, uncomplicated: J45.909

## 2019-07-07 MED ORDER — IPRATROPIUM-ALBUTEROL 0.5-2.5 (3) MG/3ML IN SOLN: 3.0000 mL | Freq: Four times a day (QID) | RESPIRATORY_TRACT | 0 refills | Status: DC | PRN

## 2019-07-07 NOTE — Telephone Encounter (Signed)
Ok so in the new letter did you add in what his medication regimen is below and is he taking them as directed?   - long-acting antihistamine like Xyzal 5mg  or Allegra daily  - Singulair 10mg  daily at bedtime.   - nasal saline rinses (ie. Netipot) to help flush out the nose and help with moisturization - nasal Atrovent 2 sprays each nostril twice a day  - albuterol inhaler 2 puffs every 4-6 hours as needed f - Qvar 2 puffs twice a day to help control breathing symptoms

## 2019-07-07 NOTE — Telephone Encounter (Signed)
Left message to discuss letter

## 2019-07-07 NOTE — Telephone Encounter (Signed)
New letter should be created as below.   Since he is still having SOB and albuterol needs with Qvar let's step him up to Symbicort 2 puffs twice a day.     --------------------------------------------------------------- To Whom it May Concern:    (insert pt name) is a patient of mine with allergic rhinitis with conjunctivitis as well as moderate persistent reactive airway disease at the [facility].  Due to these atopic conditions he may exhibit symptoms of allergic rhinitis with conjunctivitis that can include itchy/watery/red eyes, sneezing, nasal congestion or drainage and/or generalized itching or hives.  These symptoms can improve with the following regimen he has been advised to take: antihistamines (Xyzal or Allegra 1-2 times a day), nasal spray (Atrovent twice a day) and Singulair daily.  With reactive airway disease if triggered he can exhibit symptoms of cough, wheeze, chest tightness and/or shortness of breath that should be relieved or improved with use of albuterol inhaler 2 puffs every 4-6 hours as needed as well as maintenance inhaler Symbicort 2 puffs twice a day.   Symptoms of fever, muscle or body aches, nausea, vomiting, diarrhea, loss of taste are not typically seen in allergic rhinitis with conjunctivitis or reactive airway disease.       Sincerely,        Margo Aye, MD [facility]

## 2019-07-07 NOTE — Discharge Instructions (Signed)
Chest Xray normal Continue with prescribed asthma medicines May try DuoNeb nebulizers at home for further relief of wheezing, chest tightness and shortness of breath Follow-up with your asthma/allergy as needed  Follow-up if developing increased chest pain, difficulty breathing or shortness of breath, fevers

## 2019-07-07 NOTE — Telephone Encounter (Signed)
Patient is currently taking all medications as prescribed. Please advise on letter and dosage changes?

## 2019-07-07 NOTE — Telephone Encounter (Signed)
Ok if the letter now needs to start what medications he is to take and for what reason then will need to revise the letter.    The regimen I recommended he take at last visit is in the previous message.  Is he taking those medications?  If he is and he is still having respiratory symptoms on Qvar then it likely we need to step up therapy.  Thus in order to update the letter with medication will need to know what he is actually taking at this time.

## 2019-07-07 NOTE — Telephone Encounter (Signed)
Already made a letter.  See previous telephone encounter.     What symptoms does he continue to have and what medications is he currently taking.  Hopefully we can maximize his regimen for better symptom control so that he is less symptomatic.

## 2019-07-07 NOTE — ED Triage Notes (Addendum)
Pt states he has SOB x 2 days. Pt states she would like to have a chest xray.

## 2019-07-07 NOTE — ED Provider Notes (Signed)
[facility]    CSN: [id number] Arrival date & time: 07/07/19  1402      History   Chief Complaint Chief Complaint  Patient presents with  . Appointment    230  . Shortness of Breath    HPI Jordan [name] is a 44 y.o. male history of asthma, GERD, presenting today for evaluation of shortness of breath.  Patient states that over the past few days he has had worsening shortness of breath.  He has felt congested and wheezing.  He feels as if he has difficulty taking a full deep breath.  Denies chest pain.  Denies leg pain or leg swelling.  Denies prior DVT/PE.  Denies tobacco use.  Denies recent travel/immobilization.  He has recently been seeing asthma/allergy center for his uncontrolled asthma as he has missed a lot of work related to this.  He is currently on Xyzal, Singulair, saline rinses, albuterol and Qvar.  He does not have nebulizers at home.  Recent negative Covid on 1/28.  Also recent treatment for sinusitis with Augmentin and course of prednisone.  He has finished a course of prednisone, but still has a few doses of Augmentin left.  Has helped some, but is not fully relieved congestion.   HPI  Past Medical History:  Diagnosis Date  . Asthma   . Reflux esophagitis     There are no problems to display for this patient.   Past Surgical History:  Procedure Laterality Date  . NO PAST SURGERIES         Home Medications    Prior to Admission medications   Medication Sig Start Date End Date Taking? Authorizing Provider  albuterol (PROAIR HFA) 108 (90 Base) MCG/ACT inhaler Inhale 1-2 puffs into the lungs every 6 (six) hours as needed for wheezing or shortness of breath. 06/14/19   Georgetta Haber, NP  amLODipine (NORVASC) 5 MG tablet Take 1 tablet (5 mg total) by mouth daily. 06/13/19   Hall-Potvin, [place], PA-C  amoxicillin-clavulanate (AUGMENTIN) 875-125 MG tablet Take 1 tablet by mouth every 12 (twelve) hours. 06/29/19   Cathie Hoops, Amy V, PA-C  beclomethasone (QVAR)  80 MCG/ACT inhaler Inhale 2 puffs into the lungs 2 (two) times daily. 06/10/19   Marcelyn Bruins, MD  ipratropium (ATROVENT) 0.06 % nasal spray Place 2 sprays into both nostrils 3 (three) times daily. 06/10/19   Marcelyn Bruins, MD  ipratropium-albuterol (DUONEB) 0.5-2.5 (3) MG/3ML SOLN Take 3 mLs by nebulization every 6 (six) hours as needed (wheezing, shortness of breath, chest tightness). 07/07/19   Wieters, Hallie C, PA-C  loperamide (IMODIUM) 2 MG capsule Take 1 capsule (2 mg total) by mouth daily as needed for diarrhea or loose stools. Patient not taking: Reported on 06/10/2019 01/11/19   Wallis Bamberg, PA-C  meloxicam (MOBIC) 7.5 MG tablet Take 1 tablet (7.5 mg total) by mouth daily. Patient not taking: Reported on 06/10/2019 01/11/19   Wallis Bamberg, PA-C  montelukast (SINGULAIR) 10 MG tablet Take 1 tablet (10 mg total) by mouth at bedtime. 06/10/19   Marcelyn Bruins, MD  pantoprazole (PROTONIX) 20 MG tablet Take 1 tablet (20 mg total) by mouth daily. Patient not taking: Reported on 06/10/2019 05/29/19   Hall-Potvin, [place], PA-C  predniSONE (DELTASONE) 50 MG tablet Take 1 tablet (50 mg total) by mouth daily with breakfast. 06/29/19   Cathie Hoops, Amy V, PA-C  cetirizine (ZYRTEC ALLERGY) 10 MG tablet Take 1 tablet (10 mg total) by mouth daily. Patient not taking: Reported on 06/10/2019 03/22/19  06/13/19  Wallis Bamberg, PA-C  fluticasone (FLONASE) 50 MCG/ACT nasal spray Place 2 sprays into both nostrils daily. Patient not taking: Reported on 06/10/2019 05/17/19 06/13/19  Bing Neighbors, FNP    Family History Family History  Problem Relation Age of Onset  . Healthy Mother   . Healthy Father     Social History Social History   Tobacco Use  . Smoking status: Never Smoker  . Smokeless tobacco: Never Used  Substance Use Topics  . Alcohol use: Yes    Comment: occ  . Drug use: No     Allergies   Patient has no known allergies.   Review of Systems Review of Systems   Constitutional: Negative for activity change, appetite change, chills, fatigue and fever.  HENT: Positive for congestion, postnasal drip, rhinorrhea and sore throat. Negative for ear pain, sinus pressure and trouble swallowing.   Eyes: Negative for discharge and redness.  Respiratory: Positive for chest tightness, shortness of breath and wheezing. Negative for cough.   Cardiovascular: Negative for chest pain.  Gastrointestinal: Negative for abdominal pain, diarrhea, nausea and vomiting.  Musculoskeletal: Negative for myalgias.  Skin: Negative for rash.  Neurological: Negative for dizziness, light-headedness and headaches.     Physical Exam Triage Vital Signs ED Triage Vitals  Enc Vitals Group     BP 07/07/19 1427 127/88     Pulse Rate 07/07/19 1427 96     Resp 07/07/19 1427 16     Temp 07/07/19 1427 98.4 F (36.9 C)     Temp Source 07/07/19 1427 Oral     SpO2 07/07/19 1427 97 %     Weight 07/07/19 1426 179 lb 9.6 oz (81.5 kg)     Height --      Head Circumference --      Peak Flow --      Pain Score 07/07/19 1425 3     Pain Loc --      Pain Edu? --      Excl. in GC? --    No data found.  Updated Vital Signs BP 127/88 (BP Location: Right Arm)   Pulse 96   Temp 98.4 F (36.9 C) (Oral)   Resp 16   Wt 179 lb 9.6 oz (81.5 kg)   SpO2 97%   BMI 26.52 kg/m   Visual Acuity Right Eye Distance:   Left Eye Distance:   Bilateral Distance:    Right Eye Near:   Left Eye Near:    Bilateral Near:     Physical Exam Vitals and nursing note reviewed.  Constitutional:      Appearance: He is well-developed.  HENT:     Head: Normocephalic and atraumatic.     Ears:     Comments: Bilateral ears without tenderness to palpation of external auricle, tragus and mastoid, EAC's without erythema or swelling, TM's with good bony landmarks and cone of light. Non erythematous.    Mouth/Throat:     Comments: Oral mucosa pink and moist, no tonsillar enlargement or exudate. Posterior  pharynx patent and nonerythematous, no uvula deviation or swelling. Normal phonation. Eyes:     Conjunctiva/sclera: Conjunctivae normal.  Cardiovascular:     Rate and Rhythm: Normal rate and regular rhythm.     Heart sounds: No murmur.  Pulmonary:     Effort: Pulmonary effort is normal. No respiratory distress.     Breath sounds: Normal breath sounds.     Comments: Breathing comfortably at rest, CTABL, no wheezing, rales or other adventitious sounds  auscultated Abdominal:     Palpations: Abdomen is soft.     Tenderness: There is no abdominal tenderness.  Musculoskeletal:     Cervical back: Neck supple.     Comments: Bilateral lower leg symmetric, no calf tenderness  Skin:    General: Skin is warm and dry.  Neurological:     Mental Status: He is alert.      UC Treatments / Results  Labs (all labs ordered are listed, but only abnormal results are displayed) Labs Reviewed  NOVEL CORONAVIRUS, NAA (HOSP ORDER, SEND-OUT TO REF LAB; TAT 18-24 HRS)    EKG   Radiology DG Chest 2 View  Result Date: 07/07/2019 CLINICAL DATA:  Shortness of breath EXAM: CHEST - 2 VIEW COMPARISON:  06/08/2018 FINDINGS: Heart and mediastinal contours are within normal limits. No focal opacities or effusions. No acute bony abnormality. IMPRESSION: No active cardiopulmonary disease. Electronically Signed   By: Rolm Baptise M.D.   On: 07/07/2019 15:36    Procedures Procedures (including critical care time)  Medications Ordered in UC Medications - No data to display  Initial Impression / Assessment and Plan / UC Course  I have reviewed the triage vital signs and the nursing notes.  Pertinent labs & imaging results that were available during my care of the patient were reviewed by me and considered in my medical decision making (see chart for details).    Multiple recent COVID tests negative. X-ray normal, vital signs stable in clinic today, lungs clear to auscultation.  Patient has been seen multiple  times recently as well as by asthma and allergy.  Recently on a course of steroids.  This time will have finish course of Augmentin, will provide nebulizers/duo nebs as alternative to inhaler.  Continue medicines prescribed by asthma specialist.  Continue to follow-up with them for persistent symptoms.  Defering further steroids.  Discussed strict return precautions. Patient verbalized understanding and is agreeable with plan.  Final Clinical Impressions(s) / UC Diagnoses   Final diagnoses:  SOB (shortness of breath)  Moderate persistent asthma without complication     Discharge Instructions     Chest Xray normal Continue with prescribed asthma medicines May try DuoNeb nebulizers at home for further relief of wheezing, chest tightness and shortness of breath Follow-up with your asthma/allergy as needed  Follow-up if developing increased chest pain, difficulty breathing or shortness of breath, fevers     ED Prescriptions    Medication Sig Dispense Auth. Provider   ipratropium-albuterol (DUONEB) 0.5-2.5 (3) MG/3ML SOLN Take 3 mLs by nebulization every 6 (six) hours as needed (wheezing, shortness of breath, chest tightness). 360 mL Wieters, [place] C, PA-C     PDMP not reviewed this encounter.   Janith Lima, [place] 07/07/19 [phone]

## 2019-07-07 NOTE — Telephone Encounter (Signed)
Patient called back and stated the letter he gave his employer was not enough information as to why he needs to take this medication. Patient states he needs it to specify as to what medications is allowed to take while at work. The letter needs to state what he is taking to better help his case with missing work so much due to his symptoms with asthma flare ups. Patient is taking current regimen but as been seen at the Urgent Care most recently due to can't get relieve from shortness of breath. Patient states his job is wanting to be on intermittent FMLA due to his Asthma and symptoms as well he has miss a lot of work and due to most recent visit to the urgent care he will be missing work again until he is symptom free. Advise patient to continue the current regimen until we can get a better understanding of what medication works best.

## 2019-07-07 NOTE — Telephone Encounter (Signed)
Letter was generated to the old letter. We can create the new letter if you like?

## 2019-07-08 MED ORDER — BUDESONIDE-FORMOTEROL FUMARATE 80-4.5 MCG/ACT IN AERO: 2.0000 | INHALATION_SPRAY | Freq: Two times a day (BID) | RESPIRATORY_TRACT | 5 refills | Status: DC

## 2019-07-08 NOTE — Telephone Encounter (Signed)
Left message to let patient know we created a new letter for his job as well as letting patient know we sent in Symbicort to replace his Qvar to the pharmacy.

## 2019-07-08 NOTE — Telephone Encounter (Signed)
Patient called back and was informed that a new letter has been sent out through mail and that Symbicort has been sent in to replace Qvar, Patient verbalized understanding.

## 2019-07-08 NOTE — Telephone Encounter (Signed)
Letter was placed in mail for patient

## 2019-07-09 LAB — NOVEL CORONAVIRUS, NAA (HOSP ORDER, SEND-OUT TO REF LAB; TAT 18-24 HRS): SARS-CoV-2, NAA: NOT DETECTED

## 2019-07-09 NOTE — Telephone Encounter (Signed)
Ok sounds good

## 2019-07-09 NOTE — Telephone Encounter (Signed)
Patient called and wanted to updated the office that he received a nebulizer machine from the urgent care on 2/9. Patient also has a new patient appointment with a primary care on 07/14/2019.   Patient states FMLA forms will be faxed over shortly.

## 2019-07-09 NOTE — Telephone Encounter (Signed)
Dr. Irving Burton

## 2019-07-13 ENCOUNTER — Other Ambulatory Visit: Payer: Self-pay

## 2019-07-14 ENCOUNTER — Telehealth: Payer: Self-pay | Admitting: Family Medicine

## 2019-07-14 ENCOUNTER — Ambulatory Visit (INDEPENDENT_AMBULATORY_CARE_PROVIDER_SITE_OTHER): Payer: BC Managed Care – PPO | Admitting: Family Medicine

## 2019-07-14 ENCOUNTER — Telehealth: Payer: Self-pay

## 2019-07-14 ENCOUNTER — Encounter: Payer: Self-pay | Admitting: Family Medicine

## 2019-07-14 VITALS — BP 134/80 | HR 95 | Temp 97.5°F | Ht 69.0 in | Wt 180.0 lb

## 2019-07-14 DIAGNOSIS — I10 Essential (primary) hypertension: Secondary | ICD-10-CM

## 2019-07-14 DIAGNOSIS — F4329 Adjustment disorder with other symptoms: Secondary | ICD-10-CM | POA: Diagnosis not present

## 2019-07-14 DIAGNOSIS — Z0000 Encounter for general adult medical examination without abnormal findings: Secondary | ICD-10-CM | POA: Insufficient documentation

## 2019-07-14 DIAGNOSIS — J4540 Moderate persistent asthma, uncomplicated: Secondary | ICD-10-CM | POA: Diagnosis not present

## 2019-07-14 DIAGNOSIS — J45909 Unspecified asthma, uncomplicated: Secondary | ICD-10-CM | POA: Insufficient documentation

## 2019-07-14 MED ORDER — AMLODIPINE BESYLATE 5 MG PO TABS: 5.0000 mg | ORAL_TABLET | Freq: Every day | ORAL | 0 refills | Status: DC

## 2019-07-14 MED ORDER — PREDNISONE 10 MG PO TABS: ORAL_TABLET | ORAL | 0 refills | Status: DC

## 2019-07-14 NOTE — Telephone Encounter (Signed)
Patient states he cannot be seen this week do you want to see him next week.

## 2019-07-14 NOTE — Telephone Encounter (Signed)
Patient was notified on how to take his current regimen per Dr. Delorse Lek request. Patient verbalized understanding and will follow current regimen. Also Prednisone was sent into patient pharmacy to use for the next few days. Patient has an appt to see Dr. Delorse Lek on 08/07/2019

## 2019-07-14 NOTE — Progress Notes (Signed)
New Patient Office Visit  Subjective:  Patient ID: Jordan [name], male    DOB: 1976-05-01  Age: 44 y.o. MRN: [id number]  CC:  Chief Complaint  Patient presents with  . Establish Care    New pt, asthma evalution pt would also like to go over BP medications with you to see what you recommend.     HPI Jordan [name] presents for establishment of care and follow-up for his hypertension.  Hypertension has been treated well with the Norvasc and has had no difficulty with the medicine.  Denies lower extremity edema.  He does not smoke or use illicit drugs.  He drinks alcohol on occasion.  He lives with his wife and 4 children.  His father passed in his 47s and patient is not sure why.  Mom is in relatively good health.  Patient has not been able to see the dentist over the last year.  He tells me that he has no history of asthma but developed breathing difficulties in 2019.  Denies a past medical history of depression or anxiety.  He has been seen an allergist for his breathing difficulties.  He is on multiple medications for this problem and does not believe that they are helping him much.  Finished a round of amoxicillin and prednisone about a week ago.  He has had multiple ER visits for breathing difficulties.  Multiple Covid tests have been negative.  Last normal chest x-ray was about a week ago.  Past Medical History:  Diagnosis Date  . Asthma   . Reflux esophagitis     Past Surgical History:  Procedure Laterality Date  . NO PAST SURGERIES      Family History  Problem Relation Age of Onset  . Healthy Mother   . Healthy Father     Social History   Socioeconomic History  . Marital status: Married    Spouse name: Not on file  . Number of children: Not on file  . Years of education: Not on file  . Highest education level: Not on file  Occupational History  . Not on file  Tobacco Use  . Smoking status: Never Smoker  . Smokeless tobacco: Never Used  Substance and Sexual Activity  .  Alcohol use: Yes    Comment: occ  . Drug use: No  . Sexual activity: Yes  Other Topics Concern  . Not on file  Social History Narrative  . Not on file   Social Determinants of Health   Financial Resource Strain:   . Difficulty of Paying Living Expenses: Not on file  Food Insecurity:   . Worried About Programme researcher, broadcasting/film/video in the Last Year: Not on file  . Ran Out of Food in the Last Year: Not on file  Transportation Needs:   . Lack of Transportation (Medical): Not on file  . Lack of Transportation (Non-Medical): Not on file  Physical Activity:   . Days of Exercise per Week: Not on file  . Minutes of Exercise per Session: Not on file  Stress:   . Feeling of Stress : Not on file  Social Connections:   . Frequency of Communication with Friends and Family: Not on file  . Frequency of Social Gatherings with Friends and Family: Not on file  . Attends Religious Services: Not on file  . Active Member of Clubs or Organizations: Not on file  . Attends Banker Meetings: Not on file  . Marital Status: Not on file  Intimate Partner  Violence:   . Fear of Current or Ex-Partner: Not on file  . Emotionally Abused: Not on file  . Physically Abused: Not on file  . Sexually Abused: Not on file    ROS Review of Systems  Constitutional: Negative.   HENT: Negative.   Eyes: Negative for photophobia and visual disturbance.  Respiratory: Positive for shortness of breath and wheezing. Negative for chest tightness.   Cardiovascular: Negative.   Gastrointestinal: Negative.   Endocrine: Negative for polyphagia and polyuria.  Genitourinary: Negative.   Musculoskeletal: Negative for gait problem and joint swelling.  Skin: Negative for pallor and rash.  Allergic/Immunologic: Negative for immunocompromised state.  Neurological: Negative for light-headedness and numbness.  Hematological: Does not bruise/bleed easily.  Psychiatric/Behavioral: Positive for dysphoric mood. The patient is  nervous/anxious.     Objective:   Today's Vitals: BP 134/80   Pulse 95   Temp (!) 97.5 F (36.4 C) (Tympanic)   Ht 5\' 9"  (1.753 m)   Wt 180 lb (81.6 kg)   SpO2 96%   BMI 26.58 kg/m   Physical Exam Constitutional:      General: He is not in acute distress.    Appearance: Normal appearance. He is normal weight. He is not ill-appearing, toxic-appearing or diaphoretic.  HENT:     Head: Normocephalic and atraumatic.     Right Ear: Tympanic membrane, ear canal and external ear normal.     Left Ear: Tympanic membrane, ear canal and external ear normal.     Nose: No congestion or rhinorrhea.  Eyes:     Extraocular Movements: Extraocular movements intact.     Conjunctiva/sclera: Conjunctivae normal.     Pupils: Pupils are equal, round, and reactive to light.  Cardiovascular:     Rate and Rhythm: Normal rate and regular rhythm.  Pulmonary:     Effort: Pulmonary effort is normal.     Breath sounds: Normal breath sounds.  Abdominal:     General: Bowel sounds are normal.  Musculoskeletal:     Cervical back: No rigidity or tenderness.  Lymphadenopathy:     Cervical: No cervical adenopathy.  Skin:    General: Skin is warm and dry.  Neurological:     Mental Status: He is alert and oriented to person, place, and time.  Psychiatric:        Mood and Affect: Mood normal.        Behavior: Behavior normal.     Assessment & Plan:   Problem List Items Addressed This Visit      Cardiovascular and Mediastinum   Essential hypertension - Primary   Relevant Medications   amLODipine (NORVASC) 5 MG tablet   Other Relevant Orders   Comprehensive metabolic panel     Respiratory   Reactive airway disease     Other   Stress and adjustment reaction   Relevant Orders   Ambulatory referral to Psychology   Healthcare maintenance   Relevant Orders   CBC   Comprehensive metabolic panel   Lipid panel   TSH   Urinalysis, Routine w reflex microscopic   HIV Antibody (routine testing w  rflx)      Outpatient Encounter Medications as of 07/14/2019  Medication Sig  . albuterol (PROAIR HFA) 108 (90 Base) MCG/ACT inhaler Inhale 1-2 puffs into the lungs every 6 (six) hours as needed for wheezing or shortness of breath.  . budesonide-formoterol (SYMBICORT) 80-4.5 MCG/ACT inhaler Inhale 2 puffs into the lungs 2 (two) times daily.  Marland Kitchen ipratropium-albuterol (DUONEB) 0.5-2.5 (3) MG/3ML  SOLN Take 3 mLs by nebulization every 6 (six) hours as needed (wheezing, shortness of breath, chest tightness).  . montelukast (SINGULAIR) 10 MG tablet Take 1 tablet (10 mg total) by mouth at bedtime.  Marland Kitchen amLODipine (NORVASC) 5 MG tablet Take 1 tablet (5 mg total) by mouth daily.  Marland Kitchen amoxicillin-clavulanate (AUGMENTIN) 875-125 MG tablet Take 1 tablet by mouth every 12 (twelve) hours. (Patient not taking: Reported on 07/14/2019)  . ipratropium (ATROVENT) 0.06 % nasal spray Place 2 sprays into both nostrils 3 (three) times daily. (Patient not taking: Reported on 07/14/2019)  . loperamide (IMODIUM) 2 MG capsule Take 1 capsule (2 mg total) by mouth daily as needed for diarrhea or loose stools. (Patient not taking: Reported on 06/10/2019)  . meloxicam (MOBIC) 7.5 MG tablet Take 1 tablet (7.5 mg total) by mouth daily. (Patient not taking: Reported on 06/10/2019)  . pantoprazole (PROTONIX) 20 MG tablet Take 1 tablet (20 mg total) by mouth daily. (Patient not taking: Reported on 06/10/2019)  . predniSONE (DELTASONE) 50 MG tablet Take 1 tablet (50 mg total) by mouth daily with breakfast. (Patient not taking: Reported on 07/14/2019)  . [DISCONTINUED] amLODipine (NORVASC) 5 MG tablet Take 1 tablet (5 mg total) by mouth daily. (Patient not taking: Reported on 07/14/2019)  . [DISCONTINUED] cetirizine (ZYRTEC ALLERGY) 10 MG tablet Take 1 tablet (10 mg total) by mouth daily. (Patient not taking: Reported on 06/10/2019)  . [DISCONTINUED] fluticasone (FLONASE) 50 MCG/ACT nasal spray Place 2 sprays into both nostrils daily. (Patient not  taking: Reported on 06/10/2019)   No facility-administered encounter medications on file as of 07/14/2019.    Follow-up: Return in about 3 months (around 10/11/2019), or if symptoms worsen or fail to improve, for Return fasting for above blood work. Follow up with Allergy/Asthma doctor for breathing issues. .   Patient will return fasting for above ordered blood work.  We will continue his Norvasc at 5 mg daily.  He will follow-up in 3 months for complete physical exam.  Continues to complain of ongoing issues with breathing.  He is on multiple medicines for his breathing difficulties.  He was advised to follow back up with his allergy and asthma doctor for these issues.  He had a remarkably positive PHQ-9 but says that he has only felt depressed and anxious over the last week or 2 because of his breathing difficulties.  He denies any past medical history of depression or anxiety.  I pressed him on these specific issues.  Mliss Sax, MD

## 2019-07-14 NOTE — Telephone Encounter (Signed)
Pt would like to transfer from Dr Doreene Burke to [facility]

## 2019-07-14 NOTE — Telephone Encounter (Signed)
Please advised that he can use his albuterol every 4 hours for the next 2 to 3 days while awake and every other treatment can be the DuoNeb.  Continue the Symbicort 2 puffs twice a day and please ensure that he is using the Symbicort properly.  He should still have his Qvar and would recommend adding this in temporarily to the Symbicort 2 puffs twice a day.   Continue both Singulair and Xyzal at this time.    He likely needs an extension of the prednisone for better control and would recommend he take 30 mg twice a day for 3 days, 20 mg twice a day for 3 days, 10 mg twice a day for 3 days, 10 mg once a day for 3 days and stop.    I do feel at this time if he has not had any recent testing for Covid then he should be retested with these current symptoms.    If symptoms persist despite extension of the prednisone course then would recommend he have a chest x-ray done.    Please see if you can schedule him for a regular office visit in the next week (either with myself or with Thurston Hole).   If he feels that he is having worsening shortness of breath, cough, wheezing or chest tightness or that his albuterol is not relieving his symptoms then I would recommend a visit to the urgent care or to the ED for further evaluation and management.

## 2019-07-14 NOTE — Telephone Encounter (Signed)
Can we move up this appt?   Can in come in before 3 weeks?

## 2019-07-14 NOTE — Telephone Encounter (Signed)
Patient called stating he seen his PCP today and was told to call us  About his shortness of breath. Patient stated he wanted to go to the emergency room last night, but decided to wait since he was going to his PCP today.  Please Advise.

## 2019-07-14 NOTE — Telephone Encounter (Signed)
Called and spoke with patient and he stated every since he finished the Prednisone and Amoxicillin about 1 week ago he started having congestion and tightness in the center of his chest. He feels as if he cannot take a good deep and feels like he may have chest congestion. He states that he is using his albuterol inhaler consistently along with his Duoneb treatments every 6 hours along with the Symbicort 2 puffs BID and his Singulair and Xyzal and is still not having relief and almost went to the ER last night. He had a visit with his PCP today but they did not do anything for him regarding his breathing issues and wanted Jasmeet to follow up with [place]. Please advise further treatment.

## 2019-07-15 MED ORDER — QVAR REDIHALER 80 MCG/ACT IN AERB: 2.0000 | INHALATION_SPRAY | Freq: Two times a day (BID) | RESPIRATORY_TRACT | 5 refills | Status: DC

## 2019-07-15 NOTE — Telephone Encounter (Signed)
Called and left detailed voicemail per DPR permission advising of QVAR and FMLA paperwork being completed and faxed to requesting agency. FMLA paperwork has been labeled and placed in bulk scanning.

## 2019-07-15 NOTE — Telephone Encounter (Signed)
qvar sent to pharmacy.

## 2019-07-15 NOTE — Telephone Encounter (Signed)
Okay 

## 2019-07-15 NOTE — Telephone Encounter (Signed)
ok 

## 2019-07-15 NOTE — Telephone Encounter (Signed)
Attempted to call patient to notify of medications sent in.  Phone rang and then a busy signal.  No voicemail box available.

## 2019-07-15 NOTE — Telephone Encounter (Signed)
Jordan [name] it looks like he may need to keep his current appt; I'm not seeing any openings in the schedule.

## 2019-07-15 NOTE — Telephone Encounter (Signed)
Patient called and would like Qvar sent in to [facility] on [address].  Please advise.

## 2019-07-15 NOTE — Addendum Note (Signed)
Addended by: Teressa Senter on: 07/15/2019 10:35 AM   Modules accepted: Orders

## 2019-07-16 NOTE — Telephone Encounter (Addendum)
Pt aware, will call back to schedule

## 2019-07-17 ENCOUNTER — Other Ambulatory Visit: Payer: BC Managed Care – PPO

## 2019-07-20 ENCOUNTER — Ambulatory Visit (INDEPENDENT_AMBULATORY_CARE_PROVIDER_SITE_OTHER): Payer: BC Managed Care – PPO | Admitting: Allergy

## 2019-07-20 ENCOUNTER — Encounter: Payer: Self-pay | Admitting: Allergy

## 2019-07-20 ENCOUNTER — Telehealth: Payer: Self-pay | Admitting: Allergy

## 2019-07-20 ENCOUNTER — Other Ambulatory Visit: Payer: Self-pay

## 2019-07-20 VITALS — BP 120/78 | HR 90 | Temp 98.5°F | Resp 16 | Ht 69.0 in | Wt 179.4 lb

## 2019-07-20 DIAGNOSIS — J4540 Moderate persistent asthma, uncomplicated: Secondary | ICD-10-CM

## 2019-07-20 DIAGNOSIS — H1010 Acute atopic conjunctivitis, unspecified eye: Secondary | ICD-10-CM | POA: Diagnosis not present

## 2019-07-20 DIAGNOSIS — J302 Other seasonal allergic rhinitis: Secondary | ICD-10-CM | POA: Diagnosis not present

## 2019-07-20 DIAGNOSIS — J45909 Unspecified asthma, uncomplicated: Secondary | ICD-10-CM | POA: Insufficient documentation

## 2019-07-20 DIAGNOSIS — R12 Heartburn: Secondary | ICD-10-CM | POA: Diagnosis not present

## 2019-07-20 DIAGNOSIS — J3089 Other allergic rhinitis: Secondary | ICD-10-CM

## 2019-07-20 MED ORDER — PANTOPRAZOLE SODIUM 20 MG PO TBEC: 20.0000 mg | DELAYED_RELEASE_TABLET | Freq: Every day | ORAL | 1 refills | Status: DC

## 2019-07-20 MED ORDER — BREZTRI AEROSPHERE 160-9-4.8 MCG/ACT IN AERO: 2.0000 | INHALATION_SPRAY | Freq: Two times a day (BID) | RESPIRATORY_TRACT | 5 refills | Status: DC

## 2019-07-20 MED ORDER — XHANCE 93 MCG/ACT NA EXHU: 2.0000 | INHALANT_SUSPENSION | Freq: Two times a day (BID) | NASAL | 5 refills | Status: DC

## 2019-07-20 NOTE — Assessment & Plan Note (Signed)
   Restart pantoprazole 20mg  in the morning. Nothing to eat or drink for 30 minutes.

## 2019-07-20 NOTE — Telephone Encounter (Signed)
Patient verbalizes that he feels like he can't get air in on the right side of his lungs but that he is able to get air in on the left side. Patient was scheduled for a sick visit today at 3pm with Dr. Selena Batten. Patient verbalized understanding.

## 2019-07-20 NOTE — Patient Instructions (Addendum)
Asthma: . Finish prednisone taper.  . Get a green flutter valve and use it to help with your lungs.  . https://careexpress.com/product/acapella-flutter-valve-green-hi-flow/ . Daily controller medication(s): start Breztri 2 puffs twice a day with spacer and rinse mouth afterwards. o Stop Qvar and Symbicort for now. o Continue Singulair 10mg  daily at night.  . Prior to physical activity: May use albuterol rescue inhaler 2 puffs 5 to 15 minutes prior to strenuous physical activities. Rescue medications: May use albuterol rescue inhaler 2 puffs or nebulizer every 4 to 6 hours as needed for shortness of breath, chest tightness, coughing, and wheezing. Monitor frequency of use.  . Asthma control goals:  o Full participation in all desired activities (may need albuterol before activity) o Albuterol use two times or less a week on average (not counting use with activity) o Cough interfering with sleep two times or less a month o Oral steroids no more than once a year o No hospitalizations  Allergic rhinitis:  Start Xhance 2 sprays twice a day. Demonstrated proper use and sample given.  May use over the counter antihistamines such as Zyrtec (cetirizine), Claritin (loratadine), Allegra (fexofenadine), or Xyzal (levocetirizine) daily as needed.  Reflux:  Restart pantoprazole 20mg  in the morning. Nothing to eat or drink for 30 minutes.  Follow up in 3 weeks

## 2019-07-20 NOTE — Assessment & Plan Note (Signed)
Noticed worsening symptoms once done with prednisone. Had normal CXR. Inability to get a deep breath mainly on the left side. Ran out of Protonix 1 month ago.  . Today's act score 10. . Today's spirometry was normal.  . Finish prednisone taper.  . Start using a green flutter valve to help with the breathing.  . Daily controller medication(s): start Breztri 2 puffs twice a day with spacer and rinse mouth afterwards. Sample given.  o Stop Qvar and Symbicort for now. o Continue Singulair 10mg  daily at night.  . Prior to physical activity: May use albuterol rescue inhaler 2 puffs 5 to 15 minutes prior to strenuous physical activities. Rescue medications: May use albuterol rescue inhaler 2 puffs or nebulizer every 4 to 6 hours as needed for shortness of breath, chest tightness, coughing, and wheezing. Monitor frequency of use.  . Repeat spirometry at next visit.

## 2019-07-20 NOTE — Telephone Encounter (Signed)
Attempted to call patient to get more details on his symptoms and possibly schedule a sick visit. No answer. Left message.

## 2019-07-20 NOTE — Telephone Encounter (Signed)
Patient called and said he is on prednisone. He started with 39 pills and has already taken 24 pills. He said it is still not clearing up his airway.

## 2019-07-20 NOTE — Assessment & Plan Note (Signed)
Past history - 2021 skin testing was positive to grass, tree, mold, dust mites. Interim history - did not do environmental control measures. Nosebleeds with Flonase and ipratropium so stopped using.  Nasal congestion noted on exam which may be contributing to his sensation of inability to take a deep enough breath.  Start Xhance 2 sprays twice a day. Demonstrated proper use and sample given.  May use over the counter antihistamines such as Zyrtec (cetirizine), Claritin (loratadine), Allegra (fexofenadine), or Xyzal (levocetirizine) daily as needed.

## 2019-07-20 NOTE — Progress Notes (Signed)
Follow Up Note  RE: Jordan [name] MRN: [id number] DOB: 04-29-1976 Date of Office Visit: 07/20/2019  Referring provider: Libby Maw,* Primary care provider: Libby Maw, MD  Chief Complaint: Wheezing, Shortness of Breath (feels like he cannot get a good breath in ), Cough (feels like he has drainage in the back of his throat,), Asthma (currently on prednisone), Nasal Congestion (nasal sprays causing nose bleed), and Rash (on face and forehead)  History of Present Illness: I had the pleasure of seeing Jordan [name] for a follow up visit at [facility] on 07/20/2019. He is a 44 y.o. male, who is being followed for allergic rhino conjunctivitis and reactive airway disease. His previous allergy office visit was on 06/10/2019 with Dr. Nelva Bush. Today is a new complaint visit of shortness of breath.  Respiratory: Last week noticed that as soon as he finished his prednisone his breathing problem of inability to cath a deep breath returned.  He went to urgent care and had normal CXR. He was given a nebulizer which did not help.   Currently using Qvar 80 2 puffs BID, Symbicort 80 2 puffs BID  Taking Xyzal in AM, Singulair at night.  He feels like he can't get a deep breath on his right side more so than his left side.   Patient ran out of Protonix 1 month ago and perhaps that's when his symptoms got worse.  Not wheezing.  Allergic rhinitis with conjunctivitis Had nosebleeds with Flonase and ipratropium so stopped using those nasal sprays.  Currently on Xyzal daily.   Assessment and Plan: Jordan [name] is a 44 y.o. male with: Moderate persistent asthma Noticed worsening symptoms once done with prednisone. Had normal CXR. Inability to get a deep breath mainly on the left side. Ran out of Protonix 1 month ago.  . Today's act score 10. . Today's spirometry was normal.  . Finish prednisone taper.  . Start using a green flutter valve to help with the breathing.   . Daily controller medication(s): start Breztri 2 puffs twice a day with spacer and rinse mouth afterwards. Sample given.  o Stop Qvar and Symbicort for now. o Continue Singulair 10mg  daily at night.  . Prior to physical activity: May use albuterol rescue inhaler 2 puffs 5 to 15 minutes prior to strenuous physical activities. Marland Kitchen Rescue medications: May use albuterol rescue inhaler 2 puffs or nebulizer every 4 to 6 hours as needed for shortness of breath, chest tightness, coughing, and wheezing. Monitor frequency of use.  . Repeat spirometry at next visit.   Heartburn  Restart pantoprazole 20mg  in the morning. Nothing to eat or drink for 30 minutes.  Seasonal and perennial allergic rhinoconjunctivitis Past history - 2021 skin testing was positive to grass, tree, mold, dust mites. Interim history - did not do environmental control measures. Nosebleeds with Flonase and ipratropium so stopped using.  Nasal congestion noted on exam which may be contributing to his sensation of inability to take a deep enough breath.  Start Xhance 2 sprays twice a day. Demonstrated proper use and sample given.  May use over the counter antihistamines such as Zyrtec (cetirizine), Claritin (loratadine), Allegra (fexofenadine), or Xyzal (levocetirizine) daily as needed.  Return in about 3 weeks (around 08/10/2019).  Meds ordered this encounter  Medications  . pantoprazole (PROTONIX) 20 MG tablet    Sig: Take 1 tablet (20 mg total) by mouth daily.    Dispense:  30 tablet    Refill:  1  .  Budeson-Glycopyrrol-Formoterol (BREZTRI AEROSPHERE) 160-9-4.8 MCG/ACT AERO    Sig: Inhale 2 puffs into the lungs in the morning and at bedtime.    Dispense:  10.7 g    Refill:  5  . Fluticasone Propionate (XHANCE) 93 MCG/ACT EXHU    Sig: Place 2 sprays into the nose in the morning and at bedtime.    Dispense:  32 mL    Refill:  5   Diagnostics: Spirometry:  Tracings reviewed. His effort: Good reproducible efforts. FVC:  3.63L FEV1: 2.97L, 88% predicted FEV1/FVC ratio: 82% Interpretation: Spirometry consistent with normal pattern.  Please see scanned spirometry results for details.  Medication List:  Current Outpatient Medications  Medication Sig Dispense Refill  . albuterol (PROAIR HFA) 108 (90 Base) MCG/ACT inhaler Inhale 1-2 puffs into the lungs every 6 (six) hours as needed for wheezing or shortness of breath. 8 g 0  . amLODipine (NORVASC) 5 MG tablet Take 1 tablet (5 mg total) by mouth daily. 90 tablet 0  . ipratropium-albuterol (DUONEB) 0.5-2.5 (3) MG/3ML SOLN Take 3 mLs by nebulization every 6 (six) hours as needed (wheezing, shortness of breath, chest tightness). 360 mL 0  . montelukast (SINGULAIR) 10 MG tablet Take 1 tablet (10 mg total) by mouth at bedtime. 90 tablet 1  . predniSONE (DELTASONE) 10 MG tablet Take 3 tablets (30 mg total) by mouth 2 (two) times daily with a meal for 3 days, THEN 2 tablets (20 mg total) 2 (two) times daily with a meal for 3 days, THEN 1 tablet (10 mg total) 2 (two) times daily with a meal for 3 days, THEN 1 tablet (10 mg total) daily with breakfast for 3 days. Then Stop. 39 tablet 0  . Budeson-Glycopyrrol-Formoterol (BREZTRI AEROSPHERE) 160-9-4.8 MCG/ACT AERO Inhale 2 puffs into the lungs in the morning and at bedtime. 10.7 g 5  . Fluticasone Propionate (XHANCE) 93 MCG/ACT EXHU Place 2 sprays into the nose in the morning and at bedtime. 32 mL 5  . pantoprazole (PROTONIX) 20 MG tablet Take 1 tablet (20 mg total) by mouth daily. 30 tablet 1   No current facility-administered medications for this visit.   Allergies: No Known Allergies I reviewed his past medical history, social history, family history, and environmental history and no significant changes have been reported from his previous visit.  Review of Systems  Constitutional: Negative for appetite change, chills, fever and unexpected weight change.  HENT: Positive for congestion. Negative for rhinorrhea.   Eyes:  Negative for itching.  Respiratory: Positive for cough and shortness of breath. Negative for chest tightness and wheezing.   Gastrointestinal: Negative for abdominal pain.  Skin: Negative for rash.  Neurological: Negative for headaches.   Objective: BP 120/78 (BP Location: Left Arm, Patient Position: Sitting, Cuff Size: Normal)   Pulse 90   Temp 98.5 F (36.9 C) (Temporal)   Resp 16   Ht 5\' 9"  (1.753 m)   Wt 179 lb 6.4 oz (81.4 kg)   SpO2 97%   BMI 26.49 kg/m  Body mass index is 26.49 kg/m. Physical Exam  Constitutional: He is oriented to person, place, and time. He appears well-developed and well-nourished.  HENT:  Head: Normocephalic and atraumatic.  Right Ear: External ear normal.  Left Ear: External ear normal.  Nose: Mucosal edema (right nasal congestion) present.  Mouth/Throat: Oropharynx is clear and moist.  Eyes: Conjunctivae and EOM are normal.  Cardiovascular: Normal rate, regular rhythm and normal heart sounds. Exam reveals no gallop and no friction rub.  No  murmur heard. Pulmonary/Chest: Effort normal and breath sounds normal. He has no wheezes. He has no rales.  Musculoskeletal:     Cervical back: Neck supple.  Neurological: He is alert and oriented to person, place, and time.  Skin: Skin is warm. No rash noted.  Psychiatric: He has a normal mood and affect. His behavior is normal.  Nursing note and vitals reviewed.  Previous notes and tests were reviewed. The plan was reviewed with the patient/family, and all questions/concerned were addressed.  It was my pleasure to see Jordan [name] today and participate in his care. Please feel free to contact me with any questions or concerns.  Sincerely,  Wyline Mood, DO Allergy & Immunology  [facility] office: [phone] [facility] office: [phone] [place] office: [phone]

## 2019-07-21 ENCOUNTER — Other Ambulatory Visit: Payer: BC Managed Care – PPO

## 2019-07-22 ENCOUNTER — Telehealth: Payer: Self-pay | Admitting: Family Medicine

## 2019-07-22 ENCOUNTER — Other Ambulatory Visit: Payer: Self-pay

## 2019-07-22 ENCOUNTER — Other Ambulatory Visit (INDEPENDENT_AMBULATORY_CARE_PROVIDER_SITE_OTHER): Payer: BC Managed Care – PPO

## 2019-07-22 DIAGNOSIS — Z0000 Encounter for general adult medical examination without abnormal findings: Secondary | ICD-10-CM

## 2019-07-22 DIAGNOSIS — I10 Essential (primary) hypertension: Secondary | ICD-10-CM

## 2019-07-22 LAB — COMPREHENSIVE METABOLIC PANEL
ALT: 175 U/L — ABNORMAL HIGH (ref 0–53)
AST: 60 U/L — ABNORMAL HIGH (ref 0–37)
Albumin: 4.8 g/dL (ref 3.5–5.2)
Alkaline Phosphatase: 58 U/L (ref 39–117)
BUN: 19 mg/dL (ref 6–23)
CO2: 29 mEq/L (ref 19–32)
Calcium: 10.5 mg/dL (ref 8.4–10.5)
Chloride: 95 mEq/L — ABNORMAL LOW (ref 96–112)
Creatinine, Ser: 1.09 mg/dL (ref 0.40–1.50)
GFR: 89.21 mL/min (ref >60.00)
Glucose, Bld: 96 mg/dL (ref 70–99)
Potassium: 4.1 mEq/L (ref 3.5–5.1)
Sodium: 136 mEq/L (ref 135–145)
Total Bilirubin: 0.6 mg/dL (ref 0.2–1.2)
Total Protein: 8.3 g/dL (ref 6.0–8.3)

## 2019-07-22 LAB — URINALYSIS, ROUTINE W REFLEX MICROSCOPIC
Bilirubin Urine: NEGATIVE
Hgb urine dipstick: NEGATIVE
Ketones, ur: NEGATIVE
Leukocytes,Ua: NEGATIVE
Nitrite: NEGATIVE
RBC / HPF: NONE SEEN (ref 0–?)
Specific Gravity, Urine: 1.025 (ref 1.000–1.030)
Total Protein, Urine: NEGATIVE
Urine Glucose: NEGATIVE
Urobilinogen, UA: 0.2 (ref 0.0–1.0)
pH: 6 (ref 5.0–8.0)

## 2019-07-22 LAB — CBC
HCT: 42.1 % (ref 39.0–52.0)
Hemoglobin: 14.1 g/dL (ref 13.0–17.0)
MCHC: 33.5 g/dL (ref 30.0–36.0)
MCV: 89.8 fl (ref 78.0–100.0)
Platelets: 302 10*3/uL (ref 150.0–400.0)
RBC: 4.69 Mil/uL (ref 4.22–5.81)
RDW: 14 % (ref 11.5–15.5)
WBC: 9.1 10*3/uL (ref 4.0–10.5)

## 2019-07-22 LAB — TSH: TSH: 1.97 u[IU]/mL (ref 0.35–4.50)

## 2019-07-22 LAB — LIPID PANEL
Cholesterol: 313 mg/dL — ABNORMAL HIGH (ref 0–200)
HDL: 132.8 mg/dL (ref >39.00)
LDL Cholesterol: 147 mg/dL — ABNORMAL HIGH (ref 0–99)
NonHDL: 179.8
Total CHOL/HDL Ratio: 2
Triglycerides: 163 mg/dL — ABNORMAL HIGH (ref 0.0–149.0)
VLDL: 32.6 mg/dL (ref 0.0–40.0)

## 2019-07-22 NOTE — Telephone Encounter (Signed)
I seen that patient had an appointment for labs today. Note written and ready for pick up.

## 2019-07-22 NOTE — Telephone Encounter (Signed)
Patient calling to get a work note for today. Patient had an appointment today for labs 07/22/19. Please call patient when work note is ready to be picked up at [phone].

## 2019-07-23 ENCOUNTER — Telehealth: Payer: Self-pay | Admitting: Allergy & Immunology

## 2019-07-23 ENCOUNTER — Telehealth: Payer: Self-pay

## 2019-07-23 DIAGNOSIS — J4540 Moderate persistent asthma, uncomplicated: Secondary | ICD-10-CM

## 2019-07-23 LAB — HIV ANTIBODY (ROUTINE TESTING W REFLEX): HIV 1&2 Ab, 4th Generation: NONREACTIVE

## 2019-07-23 NOTE — Telephone Encounter (Signed)
error 

## 2019-07-23 NOTE — Telephone Encounter (Signed)
Patient called requesting to speak to a nurse regarding his issue he spoke with Dr Dellis Anes about last night.

## 2019-07-23 NOTE — Addendum Note (Signed)
Addended by: Osa Craver on: 07/23/2019 02:41 PM   Modules accepted: Orders

## 2019-07-23 NOTE — Telephone Encounter (Signed)
Discussing with Dr. Delorse Lek she suggested the prednisone count would be a pharmacy issue and will need to discuss this with them. Dr. Delorse Lek stated patient would need CBC with Diff and IGE. We will see if we could add this to his current labs to make sure there is enough blood that can provide the determination if patient would need a biologic. Patient can wait until we get these labs to discuss further options and can wait to see the Respiratory clinic. Will contact patient if further labs are required.

## 2019-07-23 NOTE — Telephone Encounter (Signed)
Okay. Thanks. See lab results note. Need to do a virtual with him.

## 2019-07-23 NOTE — Telephone Encounter (Signed)
After speaking with patient he had concerns that the prednisone the was prescribed was not enough to what was directed on the bottle. Patient was given 39 tablets which he states he only has two left and should have 3 tablets left? He also was given new medication this past week and was told to follow as directed and he stated his PCP had done blood work and wanted to know if Dr. Delorse Lek would use that to determine if he could qualify for a biologic to assist him with Asthma conditions. He also wanted to know if he should wait to see his PCP tomorrow and see if the lab can help explain is condition and then possible recommend to go the the Respiratory clinic if it shows he has problems. He wanted to wait to see if he needs to do any further work-up

## 2019-07-23 NOTE — Addendum Note (Signed)
Addended by: Lorrin Mais on: 07/23/2019 02:55 PM   Modules accepted: Orders

## 2019-07-23 NOTE — Telephone Encounter (Signed)
Lab for CBC with Diff and IGE is pending to be sign. Does patient need to be off Prednisone in order to get labs. Please advise

## 2019-07-23 NOTE — Telephone Encounter (Signed)
Appointment scheduled for virtual 

## 2019-07-23 NOTE — Telephone Encounter (Signed)
I received a call from Jordan [name] last night when I was on call.  He proceeded to list out his entire 10-day taper of steroids.  He tells me that he is going to be one 10 mg prednisone tablet short.  And then just to confirm the number, he went through the entire taper of steroids once again.  I told him that one 10 mg prednisone tablet was not going to make her break this taper, but I could certainly send it in if he felt that he needed it.  He reports feeling better than he did when he was seen for a sick visit, but he is still not 100%.  He also tells me that he is going to be starting an asthma injectable medication, although he does not remember the name.  I asked him to call us back during if he is not feeling better and we can address it at that time.  However, he still has 3 more days of his wean left and he might feel better after that.  Patient in agreement with the plan.  Malachi Bonds, MD [facility]

## 2019-07-23 NOTE — Telephone Encounter (Signed)
Patient was advise to come into the clinic to get labs drawn

## 2019-07-23 NOTE — Telephone Encounter (Signed)
Just have him go get the labs at his earliest convenience.

## 2019-07-24 ENCOUNTER — Other Ambulatory Visit: Payer: Self-pay

## 2019-07-24 ENCOUNTER — Ambulatory Visit: Payer: BC Managed Care – PPO | Admitting: Family Medicine

## 2019-07-24 ENCOUNTER — Telehealth: Payer: Self-pay | Admitting: Allergy & Immunology

## 2019-07-24 DIAGNOSIS — J4520 Mild intermittent asthma, uncomplicated: Secondary | ICD-10-CM | POA: Diagnosis not present

## 2019-07-24 NOTE — Telephone Encounter (Signed)
Patient called to ask about his lab results. I reviewed the orders and there are two "active" orders from our office. I told him that these are still pending. However, then he said "I did not get those labs drawn yet". He apparently was asking about his labs drawn at his PCP's office. I reviewed those labs and told him that I did not order those labs and cannot interpret them. He asked for them anyway. He said that this MyChart was not working.  I told him that his cholesterol panel was elevated. I gave him the numbers and the ranges for his reference. Despite my saying that I would not do anything regarding those labs, he asked "what are we going to do about it?". I told him that he has a follow up appointment with his PCP next week and this was nothing that needed to be handled over a weekend. I again reminded him that I am a specialist and definitely not trained in Internal Medicine or Family Medicine and this was all way beyond my scope of practice. I assured him that he would be fine over the weekend until he followed up with his PCP.   Malachi Bonds, MD [facility]

## 2019-07-25 ENCOUNTER — Telehealth: Payer: Self-pay | Admitting: Allergy & Immunology

## 2019-07-25 DIAGNOSIS — R0602 Shortness of breath: Secondary | ICD-10-CM

## 2019-07-25 MED ORDER — IPRATROPIUM-ALBUTEROL 0.5-2.5 (3) MG/3ML IN SOLN: 3.0000 mL | Freq: Four times a day (QID) | RESPIRATORY_TRACT | 0 refills | Status: DC | PRN

## 2019-07-25 MED ORDER — PREDNISONE 10 MG PO TABS: ORAL_TABLET | ORAL | 0 refills | Status: DC

## 2019-07-25 MED ORDER — SPACER/AERO-HOLD CHAMBER MASK MISC: 1.0000 | Freq: Two times a day (BID) | 0 refills | Status: DC | PRN

## 2019-07-25 NOTE — Telephone Encounter (Signed)
Patient called back again. The pharmacy did not have the prescriptions. I confirmed that I sent them electronically. I called and was on hold for ten minutes to speak directly to a pharmacist. They did not have my prescriptions, so I did a verbal for them.   Unfortunately, they did not have masks/spacers, so that is going to have to wait until Monday when our office is open. I let the patient know the update.   Patient wishes to be seen in clinic ASAP. Dr. Elmyra Ricks schedule is full on Monday, but Dr. Nunzio Cobbs has an appointment 4pm. Patient added. He also tells me that he needs to get labs done anyway in our office, so he is going to show up early.   Jordan Bonds, MD [facility]

## 2019-07-25 NOTE — Telephone Encounter (Signed)
Patient called back yet again. He tells me that he is having increased coughing after stopping his prednisone. He has been off it for one day. He denies any fever or chest pain. I did recommend that he go to the ED since we have given him everything without improvement and I want to make sure that he is not experiencing a pulmonary embolism. He is adamant about not wanting to go to the ED.   I offered to send in more prednisone to get him through the weekend. I also refilled his DuoNeb solution. He is asking for a mask to go with his spacer. I told him that was not necessary in a 43yo, but he was focusing on this so I just sent it in to the pharmacy.   He does have a follow up appointment with Dr. Delorse Lek, but at this point he needs a Pulmonology appointment. I do not think that the addition of a biologic is going to help at this point. Discussed with Dr. Delorse Lek and she is in agreement.  Referral placed and note routed to [facility] our Referral Coordinator.  Malachi Bonds, MD [facility]

## 2019-07-27 ENCOUNTER — Encounter: Payer: Self-pay | Admitting: Allergy and Immunology

## 2019-07-27 ENCOUNTER — Ambulatory Visit (INDEPENDENT_AMBULATORY_CARE_PROVIDER_SITE_OTHER): Payer: BC Managed Care – PPO | Admitting: Allergy

## 2019-07-27 ENCOUNTER — Encounter: Payer: Self-pay | Admitting: Allergy

## 2019-07-27 ENCOUNTER — Telehealth: Payer: Self-pay | Admitting: Allergy

## 2019-07-27 ENCOUNTER — Other Ambulatory Visit: Payer: Self-pay

## 2019-07-27 VITALS — BP 150/90 | HR 88 | Temp 97.8°F | Resp 16 | Ht 69.0 in

## 2019-07-27 DIAGNOSIS — R12 Heartburn: Secondary | ICD-10-CM | POA: Diagnosis not present

## 2019-07-27 DIAGNOSIS — J4540 Moderate persistent asthma, uncomplicated: Secondary | ICD-10-CM

## 2019-07-27 DIAGNOSIS — R0602 Shortness of breath: Secondary | ICD-10-CM | POA: Diagnosis not present

## 2019-07-27 DIAGNOSIS — J3089 Other allergic rhinitis: Secondary | ICD-10-CM

## 2019-07-27 DIAGNOSIS — I10 Essential (primary) hypertension: Secondary | ICD-10-CM

## 2019-07-27 DIAGNOSIS — J302 Other seasonal allergic rhinitis: Secondary | ICD-10-CM

## 2019-07-27 DIAGNOSIS — H1010 Acute atopic conjunctivitis, unspecified eye: Secondary | ICD-10-CM

## 2019-07-27 MED ORDER — XHANCE 93 MCG/ACT NA EXHU: 2.0000 | INHALANT_SUSPENSION | Freq: Two times a day (BID) | NASAL | 2 refills | Status: DC

## 2019-07-27 NOTE — Progress Notes (Signed)
Follow Up Note  RE: Jordan [name] MRN: [id number] DOB: [birth year] Date of Office Visit: 07/27/2019  Referring provider: Mliss Sax,* Primary care provider: Mliss Sax, MD  Chief Complaint: Wheezing  History of Present Illness: I had the pleasure of seeing Sachin Ferencz for a follow up visit at the [facility] on 07/28/2019. He is a 44 y.o. male, who is being followed for asthma, allergic rhino conjunctivitis and heartburn. His previous allergy office visit was on 07/20/2019 with Dr. Selena Batten. Today is a new complaint visit of breathing issues.  Moderate persistent asthma ACT score 11 Patient finished prednisone last Thursday but then ended up using albuterol every hour due to chest congestion. The albuterol does seem to help. His main complaint is that he cant' seems to take a full breath in on the left side of his chest as before which is leading some chest tightness. He also has some dry coughing at night. Denies wheezing and no pain with palpation.   He did not use his nebulizer as he wanted a mask device.   Currently on Breztri 2 puffs twice a day with spacer and Singulair daily.  Denies fevers, chills. He had multiple COVID testing done in the past which were all negative. Last one was done on 07/07/2019.  He doesn't like to go to the ER or urgent care because they never seem to do anything about this and the reasons why he declined going there over the weekend when speaking to our on call physician.    Reflux: Taking pantoprazole 20mg  in the morning and not sure if it's helping or not.   Allergic rhino conjunctivitis: Ran out of Xhance last week. This was the sample that was given and he did not receive his prescription in the mail.  Takes Xyzal daily in the morning.   Hypertension: Patient is on Norvasc and is aware of his elevated blood pressure.   He also had bloodwork drawn by PCP and is scheduled to have a televisit with them on 3/3 to  review the results.   Assessment and Plan: Dayten is a 44 y.o. male with: Not well controlled moderate persistent asthma Past history - 07/07/19 normal CXR, negative COVID test. Interim history - Noticed worsening symptoms once done with prednisone again last week. Main complaint is he feels as he is not able to get a deep breath in on the left side leading to chest tightness. Mild dry cough at night.  . Today's act score 11 . Today's spirometry was normal with no improvement in FEV1 post bronchodilator treatment. Patient states that he feels slightly better after the treatment.  . Based on his clinical history, symptoms and spirometry results I'm not convinced that his symptoms are mainly due to his asthma. He had elevated blood pressure in the office and had some abnormalities in his bloodwork that was ordered by PCP. He has follow up with PCP to review these items this week. . Meanwhile will draw CBC diff, Ige and D-dimer. Pulmonology referral is pending.  . No indication for additional prednisone at this time.  . Use nebulizer machine every 4-6 hours while awake for the next few days.  . Daily controller medication(s): continue Breztri 2 puffs twice a day with spacer and rinse mouth afterwards. o Continue Singulair 10mg  daily at night.  . Prior to physical activity: May use albuterol rescue inhaler 2 puffs 5 to 15 minutes prior to strenuous physical activities. 09/04/19 Rescue medications: May use  albuterol rescue inhaler 2 puffs or nebulizer every 4 to 6 hours as needed for shortness of breath, chest tightness, coughing, and wheezing. Monitor frequency of use.  . If not improved, recommend CT chest next with additional bloodwork to rule out any autoimmune processes and possibly needs cardiac evaluation as well given hypertension and hyperlipidemia.  . Advised patient to go to ER/urgent care if he has acute worsening of symptoms when the clinic is closed.   Heartburn  Continue pantoprazole 20mg  in  the morning.   Continue heartburn lifestyle - handout given.   Seasonal and perennial allergic rhinoconjunctivitis Past history - 2021 skin testing was positive to grass, tree, mold, dust mites. Interim history - ran out of Xhance last week and didn't get prescription in the mail.   Continue Xhance 2 sprays twice a day. Sent in new prescription and sample given.  May use over the counter antihistamines such as Zyrtec (cetirizine), Claritin (loratadine), Allegra (fexofenadine), or Xyzal (levocetirizine) daily as needed.  Continue environmental control measures.   Return in about 4 weeks (around 08/24/2019).  Meds ordered this encounter  Medications  . Fluticasone Propionate (XHANCE) 93 MCG/ACT EXHU    Sig: Place 2 sprays into the nose 2 (two) times daily.    Dispense:  32 mL    Refill:  2    Lab Orders     D-Dimer, Quantitative  Diagnostics: Spirometry:  Tracings reviewed. His effort: Good reproducible efforts. FVC: 3.55L FEV1: 3.00L, 88% predicted FEV1/FVC ratio: 85% Interpretation: Spirometry consistent with normal pattern with no significant improvement in FEV1 post bronchodilator treatment.  Please see scanned spirometry results for details.  Medication List:  Current Outpatient Medications  Medication Sig Dispense Refill  . albuterol (PROAIR HFA) 108 (90 Base) MCG/ACT inhaler Inhale 1-2 puffs into the lungs every 6 (six) hours as needed for wheezing or shortness of breath. 8 g 0  . amLODipine (NORVASC) 5 MG tablet Take 1 tablet (5 mg total) by mouth daily. 90 tablet 0  . Budeson-Glycopyrrol-Formoterol (BREZTRI AEROSPHERE) 160-9-4.8 MCG/ACT AERO Inhale 2 puffs into the lungs in the morning and at bedtime. 10.7 g 5  . ipratropium-albuterol (DUONEB) 0.5-2.5 (3) MG/3ML SOLN Take 3 mLs by nebulization every 6 (six) hours as needed (wheezing, shortness of breath, chest tightness). 360 mL 0  . montelukast (SINGULAIR) 10 MG tablet Take 1 tablet (10 mg total) by mouth at bedtime.  90 tablet 1  . pantoprazole (PROTONIX) 20 MG tablet Take 1 tablet (20 mg total) by mouth daily. 30 tablet 1  . Spacer/Aero-Hold Chamber Mask MISC 1 each by Other route 3 times/day as needed-between meals & bedtime. 1 each 0  . Fluticasone Propionate (XHANCE) 93 MCG/ACT EXHU Place 2 sprays into the nose 2 (two) times daily. 32 mL 2   No current facility-administered medications for this visit.   Allergies: No Known Allergies I reviewed his past medical history, social history, family history, and environmental history and no significant changes have been reported from his previous visit.  Review of Systems  Constitutional: Negative for appetite change, chills, fever and unexpected weight change.  HENT: Negative for congestion and rhinorrhea.   Eyes: Negative for itching.  Respiratory: Positive for cough and chest tightness. Negative for shortness of breath and wheezing.   Gastrointestinal: Negative for abdominal pain.  Skin: Negative for rash.  Neurological: Negative for headaches.   Objective: BP (!) 150/90 (BP Location: Left Arm, Patient Position: Sitting, Cuff Size: Normal)   Pulse 88   Temp 97.8 F (  36.6 C) (Temporal)   Resp 16   Ht 5\' 9"  (1.753 m)   SpO2 98%   BMI 26.49 kg/m  Body mass index is 26.49 kg/m. Physical Exam  Constitutional: He is oriented to person, place, and time. He appears well-developed and well-nourished.  HENT:  Head: Normocephalic and atraumatic.  Right Ear: External ear normal.  Left Ear: External ear normal.  Mouth/Throat: Oropharynx is clear and moist.  Eyes: Conjunctivae and EOM are normal.  Cardiovascular: Normal rate, regular rhythm and normal heart sounds. Exam reveals no gallop and no friction rub.  No murmur heard. Pulmonary/Chest: Effort normal and breath sounds normal. He has no wheezes. He has no rales. He exhibits no tenderness.  Musculoskeletal:     Cervical back: Neck supple.  Neurological: He is alert and oriented to person, place,  and time.  Skin: Skin is warm. No rash noted.  Psychiatric: He has a normal mood and affect. His behavior is normal.  Nursing note and vitals reviewed.  Previous notes and tests were reviewed. The plan was reviewed with the patient/family, and all questions/concerned were addressed.  It was my pleasure to see Gunther today and participate in his care. Please feel free to contact me with any questions or concerns.  Sincerely,  Leonette Most, DO Allergy & Immunology  [facility] office: [phone] [facility] office: [phone] [place] office: [phone]

## 2019-07-27 NOTE — Telephone Encounter (Signed)
Patient called stating that he has questions about his employer that he needs answered before he comes in for his appointment tpday at 4:00pm  Please advise.

## 2019-07-27 NOTE — Telephone Encounter (Signed)
Patient is on schedule for 400. This is so that you are aware of what is going on.

## 2019-07-27 NOTE — Telephone Encounter (Signed)
Left voice mail to call back 

## 2019-07-27 NOTE — Patient Instructions (Addendum)
Asthma:  . Use nebulizer machine every 4-6 hours while awake for the next few days . Daily controller medication(s): continue Breztri 2 puffs twice a day with spacer and rinse mouth afterwards. o Continue Singulair 10mg  daily at night.  . Prior to physical activity: May use albuterol rescue inhaler 2 puffs 5 to 15 minutes prior to strenuous physical activities. Marland Kitchen Rescue medications: May use albuterol rescue inhaler 2 puffs or nebulizer every 4 to 6 hours as needed for shortness of breath, chest tightness, coughing, and wheezing. Monitor frequency of use.  . Asthma control goals:  o Full participation in all desired activities (may need albuterol before activity) o Albuterol use two times or less a week on average (not counting use with activity) o Cough interfering with sleep two times or less a month o Oral steroids no more than once a year o No hospitalization  . Get bloodwork:  o We are ordering labs, so please allow 1-2 weeks for the results to come back. o With the newly implemented Cures Act, the labs might be visible to you at the same time that they become visible to me. However, I will not address the results until all of the results are back, so please be patient.   Heartburn  Continue pantoprazole 20mg  in the morning. Nothing to eat or drink for 30 minutes.  See below for heartburn lifestyle modification diet.  Seasonal and perennial allergic rhinoconjunctivitis Past history - 2021 skin testing was positive to grass, tree, mold, dust mites.  Continue Xhance 2 sprays twice a day. Sent in new prescription and sample given.  May use over the counter antihistamines such as Zyrtec (cetirizine), Claritin (loratadine), Allegra (fexofenadine), or Xyzal (levocetirizine) daily as needed.  If you are having acute worsening of symptoms, please go to ER/urgent care. Follow up with your PCP regarding your other bloodwork. You blood pressure was very high in the office today as well.   Referral to pulmonology was also sent in.  Follow up with Dr. Nelva Bush as scheduled.    Heartburn Heartburn is a type of pain or discomfort that can happen in the throat or chest. It is often described as a burning pain. It may also cause a bad, acid-like taste in the mouth. Heartburn may feel worse when you lie down or bend over. It may be worse at night. It may be caused by stomach contents that move back up (reflux) into the tube that connects the mouth with the stomach (esophagus). Follow these instructions at home: Eating and drinking   Avoid certain foods and drinks as told by your doctor. This may include: ? Coffee and tea (with or without caffeine). ? Drinks that have alcohol. ? Energy drinks and sports drinks. ? Carbonated drinks or sodas. ? Chocolate and cocoa. ? Peppermint and mint flavorings. ? Garlic and onions. ? Horseradish. ? Spicy and acidic foods, such as:  Peppers.  Chili powder and curry powder.  Vinegar.  Hot sauces and BBQ sauce. ? Citrus fruit juices and citrus fruits, such as:  Oranges.  Lemons.  Limes. ? Tomato-based foods, such as:  Red sauce and pizza with red sauce.  Chili.  Salsa. ? Fried and fatty foods, such as:  Donuts.  [place] fries and potato chips.  High-fat dressings. ? High-fat meats, such as:  Hot dogs and sausage.  Rib eye steak.  Ham and bacon. ? High-fat dairy items, such as:  Whole milk.  Butter.  Cream cheese.  Eat small meals often. Avoid  eating large meals.  Avoid drinking large amounts of liquid with your meals.  Avoid eating meals during the 2-3 hours before bedtime.  Avoid lying down right after you eat.  Do not exercise right after you eat. Lifestyle      If you are overweight, lose an amount of weight that is healthy for you. Ask your doctor about a safe weight loss goal.  Do not use any products that contain nicotine or tobacco, including cigarettes, e-cigarettes, and chewing tobacco.  These can make your symptoms worse. If you need help quitting, ask your doctor.  Wear loose clothes. Do not wear anything tight around your waist.  Raise (elevate) the head of your bed about 6 inches (15 cm) when you sleep.  Try to lower your stress. If you need help doing this, ask your doctor. General instructions  Pay attention to any changes in your symptoms.  Take over-the-counter and prescription medicines only as told by your doctor. ? Do not take aspirin, ibuprofen, or other NSAIDs unless your doctor says it is okay. ? Stop medicines only as told by your doctor.  Keep all follow-up visits as told by your doctor. This is important. Contact a doctor if:  You have new symptoms.  You lose weight and you do not know why it is happening.  You have trouble swallowing, or it hurts to swallow.  You have wheezing or a cough that keeps happening.  Your symptoms do not get better with treatment.  You have heartburn often for more than 2 weeks. Get help right away if:  You have pain in your arms, neck, jaw, teeth, or back.  You feel sweaty, dizzy, or light-headed.  You have chest pain or shortness of breath.  You throw up (vomit) and your throw up looks like blood or coffee grounds.  Your poop (stool) is bloody or black. These symptoms may represent a serious problem that is an emergency. Do not wait to see if the symptoms will go away. Get medical help right away. Call your local emergency services (911 in the [place] Do not drive yourself to the hospital. Summary  Heartburn is a type of pain that can happen in the throat or chest. It can feel like a burning pain. It may also cause a bad, acid-like taste in the mouth.  You may need to avoid certain foods and drinks to help your symptoms. Ask your doctor what foods and drinks you should avoid.  Take over-the-counter and prescription medicines only as told by your doctor. Do not take aspirin, ibuprofen, or other NSAIDs unless  your doctor told you to do so.  Contact your doctor if your symptoms do not get better or they get worse. This information is not intended to replace advice given to you by your health care provider. Make sure you discuss any questions you have with your health care provider. Document Revised: 10/14/2017 Document Reviewed: 10/14/2017 Elsevier Patient Education  2020 [employer].

## 2019-07-27 NOTE — Telephone Encounter (Signed)
Patient is wanting Dr. Delorse Lek to see if he could get notes from the time he was out of work starting 06/29/2019 until 07/27/2019 showing he was being seen for his flare ups. His job is not wanting to compensate him while being on FMLA during those times. 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 He stated he is needing this note so that he can get compensated for the time he was off. 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 Patient is wanting this address a soon as possible.

## 2019-07-28 ENCOUNTER — Encounter: Payer: Self-pay | Admitting: Allergy

## 2019-07-28 LAB — D-DIMER, QUANTITATIVE: D-DIMER: <0.2 mg/L FEU (ref 0.00–0.49)

## 2019-07-28 NOTE — Telephone Encounter (Signed)
Please schedule patient AGAIN with me to discuss his lab work. He no showed for the prior appointment.

## 2019-07-28 NOTE — Assessment & Plan Note (Signed)
   Continue pantoprazole 20mg  in the morning.   Continue heartburn lifestyle - handout given.

## 2019-07-28 NOTE — Telephone Encounter (Signed)
Patient called back asking to speak with Dr. Delorse Lek or her nurse. Patient was informed that they were in with patients, but we were working on getting a letter for his employer with his office visits. Patient states that it is very complicated to explain but he has questions about his employer that only Dr. Delorse Lek can answer. Patient wanted to know if Dr. Delorse Lek saw it fit for him to go on short term disability due to not getting better even with new medications and office visits every week. Patient states that his employer is viewing him as a high risk and he believes if they think that he should be able to go on short term disability. Patient states that if Dr. Delorse Lek sees it safe for him to go back to work then he must have that letter that states he can return when he is better or when the medications are working. Patient states that no medications are working right now.   Please advise.

## 2019-07-28 NOTE — Assessment & Plan Note (Signed)
Past history - 2021 skin testing was positive to grass, tree, mold, dust mites. Interim history - ran out of Xhance last week and didn't get prescription in the mail.   Continue Xhance 2 sprays twice a day. Sent in new prescription and sample given.  May use over the counter antihistamines such as Zyrtec (cetirizine), Claritin (loratadine), Allegra (fexofenadine), or Xyzal (levocetirizine) daily as needed.  Continue environmental control measures.

## 2019-07-28 NOTE — Telephone Encounter (Signed)
Thanks so much Ashleigh.  If he is feeling improved then that is when he should return to work.   We can absolutely provide with letter stating dates he has been in the office and he will need to put in request with [facility] for medical records for the office notes if he needs these.   We are all working together to help him to get better and that involves his PCP and other specialist as well.  I'm sure this is all very frustrating for him but please assure him that we are all working together to improve his health.

## 2019-07-28 NOTE — Telephone Encounter (Signed)
Dr. Padgett please advise. Thank You.  

## 2019-07-28 NOTE — Assessment & Plan Note (Addendum)
Past history - 07/07/19 normal CXR, negative COVID test. Interim history - Noticed worsening symptoms once done with prednisone again last week. Main complaint is he feels as he is not able to get a deep breath in on the left side leading to chest tightness. Mild dry cough at night.  . Today's act score 11 . Today's spirometry was normal with no improvement in FEV1 post bronchodilator treatment. Patient states that he feels slightly better after the treatment.  . Based on his clinical history, symptoms and spirometry results I'm not convinced that his symptoms are mainly due to his asthma. He had elevated blood pressure in the office and had some abnormalities in his bloodwork that was ordered by PCP. He has follow up with PCP to review these items this week. . Meanwhile will draw CBC diff, Ige and D-dimer. Pulmonology referral is pending.  . No indication for additional prednisone at this time.  . Use nebulizer machine every 4-6 hours while awake for the next few days.  . Daily controller medication(s): continue Breztri 2 puffs twice a day with spacer and rinse mouth afterwards. o Continue Singulair 10mg  daily at night.  . Prior to physical activity: May use albuterol rescue inhaler 2 puffs 5 to 15 minutes prior to strenuous physical activities. Rescue medications: May use albuterol rescue inhaler 2 puffs or nebulizer every 4 to 6 hours as needed for shortness of breath, chest tightness, coughing, and wheezing. Monitor frequency of use.  . If not improved, recommend CT chest next with additional bloodwork to rule out any autoimmune processes and possibly needs cardiac evaluation as well given hypertension and hyperlipidemia.  . Advised patient to go to ER/urgent care if he has acute worsening of symptoms when the clinic is closed.

## 2019-07-28 NOTE — Telephone Encounter (Signed)
At this point after speaking and reviewing notes from Dr Selena Batten who has seen him twice for acute visits and the notes from Dr. Dellis Anes who he has talked to while on call, I am concerned there is something else going on that is not related to his asthma history or his lungs.  If his symptoms were purely related to asthma alone he should have noticeable improvement on prednisone as well as improvement in symptoms with use of the inhaler medications which he is on Breztri and singulair currently.  He has had normal lung function testing on spirometry which is reassuring from a lung standpoint.  He has had a normal CXR as well.  His d-dimer is normal thus making something like a pulmonary embolus less likely.   I do see that he has abnormal liver enzymes as well abnormal lipid studies which need to be addressed by his PCP and both of these entities could be contributing to his current symptoms if he has any liver or cardiac disease.  These need to be addressed fully.     A pulmonary referral as also already been placed.    I did complete FMLA paperwork based on his initial visit with me and his reported asthma history.   At this time time he needs to have his abnormal labs addressed first and again the pulmonary visit.  I, at this time do not feel I can feel out short term disability paperwork for asthma as this does not appear to be the underlying reason for his continued symptoms.    He also would need to address safety of return to work with his PCP regarding his labs.   I would recommend he continue on the inhaler medication and singular until his pulmonary appt.  If he has worsening symptoms he needs to go to ED for evaluation.     ------------  Jordan [name]- can we make his pulm referral urgent?

## 2019-07-28 NOTE — Telephone Encounter (Signed)
Is he needing a doctor's note or the actual office visit notes?  He was seen by Dr. Selena Batten it appears twice during that time frame.    If he is needing his medical records he will need to go thru [place].  Otherwise, if needing a doctor's note that he had an office visit then you can make him a letter stating the dates he was in the office and provide to him.

## 2019-07-28 NOTE — Telephone Encounter (Signed)
Called and spoke with patient and referred all of this to him, and patient took notes. Patient verbalized understanding and will speak with his PCP and looks forward to his Pulmonologist appointment once a date and time has been set up. Patient just needs a letter to state the dates that he has had his office appointments and he does need office notes for those dates. Patient also stated that he does have some tightness in his chest and is wondering if you recommend a certain date for him to return to work, like this coming Monday? I advised to patient that you may not agree with the date as to when he returned to work but that I would inform you. Patient verbalized understanding. Please advise. Thank You.

## 2019-07-29 ENCOUNTER — Telehealth: Payer: Self-pay | Admitting: Allergy

## 2019-07-29 ENCOUNTER — Ambulatory Visit (INDEPENDENT_AMBULATORY_CARE_PROVIDER_SITE_OTHER): Payer: BC Managed Care – PPO | Admitting: Family Medicine

## 2019-07-29 ENCOUNTER — Encounter: Payer: Self-pay | Admitting: Family Medicine

## 2019-07-29 VITALS — Ht 69.0 in | Wt 175.0 lb

## 2019-07-29 DIAGNOSIS — R7989 Other specified abnormal findings of blood chemistry: Secondary | ICD-10-CM

## 2019-07-29 DIAGNOSIS — E7800 Pure hypercholesterolemia, unspecified: Secondary | ICD-10-CM

## 2019-07-29 HISTORY — DX: Other specified abnormal findings of blood chemistry: R79.89

## 2019-07-29 NOTE — Progress Notes (Addendum)
Established Patient Office Visit  Subjective:  Patient ID: Jordan [name], male    DOB: 1976-02-11  Age: 44 y.o. MRN: [id number]  CC:  Chief Complaint  Patient presents with  . Follow-up    follow up/discuss lab results    HPI Jordan [name] presents for follow-up appointment to discuss his lab work.  Liver enzymes were significantly elevated.  Patient has no history hepatitis and has not been experiencing any particular abdominal pain.  With some discussion he admitted to having 3 or 4 beers on the night before his blood work was drawn.  LDL cholesterols is also elevated.  He did assure me that he had been fasting when the labs were drawn.  Past Medical History:  Diagnosis Date  . Asthma   . Reflux esophagitis     Past Surgical History:  Procedure Laterality Date  . NO PAST SURGERIES      Family History  Problem Relation Age of Onset  . Healthy Mother   . Healthy Father     Social History   Socioeconomic History  . Marital status: Married    Spouse name: Not on file  . Number of children: Not on file  . Years of education: Not on file  . Highest education level: Not on file  Occupational History  . Not on file  Tobacco Use  . Smoking status: Never Smoker  . Smokeless tobacco: Never Used  Substance and Sexual Activity  . Alcohol use: Yes    Comment: occ  . Drug use: No  . Sexual activity: Yes  Other Topics Concern  . Not on file  Social History Narrative  . Not on file   Social Determinants of Health   Financial Resource Strain:   . Difficulty of Paying Living Expenses: Not on file  Food Insecurity:   . Worried About Charity fundraiser in the Last Year: Not on file  . Ran Out of Food in the Last Year: Not on file  Transportation Needs:   . Lack of Transportation (Medical): Not on file  . Lack of Transportation (Non-Medical): Not on file  Physical Activity:   . Days of Exercise per Week: Not on file  . Minutes of Exercise per Session: Not on file    Stress:   . Feeling of Stress : Not on file  Social Connections:   . Frequency of Communication with Friends and Family: Not on file  . Frequency of Social Gatherings with Friends and Family: Not on file  . Attends Religious Services: Not on file  . Active Member of Clubs or Organizations: Not on file  . Attends Archivist Meetings: Not on file  . Marital Status: Not on file  Intimate Partner Violence:   . Fear of Current or Ex-Partner: Not on file  . Emotionally Abused: Not on file  . Physically Abused: Not on file  . Sexually Abused: Not on file    Outpatient Medications Prior to Visit  Medication Sig Dispense Refill  . albuterol (PROAIR HFA) 108 (90 Base) MCG/ACT inhaler Inhale 1-2 puffs into the lungs every 6 (six) hours as needed for wheezing or shortness of breath. 8 g 0  . amLODipine (NORVASC) 5 MG tablet Take 1 tablet (5 mg total) by mouth daily. 90 tablet 0  . Budeson-Glycopyrrol-Formoterol (BREZTRI AEROSPHERE) 160-9-4.8 MCG/ACT AERO Inhale 2 puffs into the lungs in the morning and at bedtime. 10.7 g 5  . Fluticasone Propionate (XHANCE) 93 MCG/ACT EXHU Place 2 sprays into  the nose 2 (two) times daily. 32 mL 2  . ipratropium-albuterol (DUONEB) 0.5-2.5 (3) MG/3ML SOLN Take 3 mLs by nebulization every 6 (six) hours as needed (wheezing, shortness of breath, chest tightness). 360 mL 0  . montelukast (SINGULAIR) 10 MG tablet Take 1 tablet (10 mg total) by mouth at bedtime. 90 tablet 1  . pantoprazole (PROTONIX) 20 MG tablet Take 1 tablet (20 mg total) by mouth daily. 30 tablet 1  . Spacer/Aero-Hold Chamber Mask MISC 1 each by Other route 3 times/day as needed-between meals & bedtime. 1 each 0   No facility-administered medications prior to visit.    No Known Allergies  ROS Review of Systems  Constitutional: Negative.   Respiratory: Negative.   Cardiovascular: Negative.   Gastrointestinal: Negative.   Genitourinary: Negative.       Objective:    Physical Exam   Constitutional: No distress.  Pulmonary/Chest: Effort normal.  Neurological: He is alert.  Psychiatric: He has a normal mood and affect. His behavior is normal.    Ht 5\' 9"  (1.753 m)   Wt 175 lb (79.4 kg) Comment: per pt  BMI 25.84 kg/m  Wt Readings from Last 3 Encounters:  07/30/19 178 lb 3.2 oz (80.8 kg)  07/29/19 175 lb (79.4 kg)  07/20/19 179 lb 6.4 oz (81.4 kg)     Health Maintenance Due  Topic Date Due  . TETANUS/TDAP  04/01/1995    There are no preventive care reminders to display for this patient.  Lab Results  Component Value Date   TSH 1.97 07/22/2019   Lab Results  Component Value Date   WBC CANCELED 07/27/2019   HGB CANCELED 07/27/2019   HCT CANCELED 07/27/2019   MCV 89.8 07/22/2019   PLT CANCELED 07/27/2019   Lab Results  Component Value Date   NA 135 08/03/2019   K 4.1 08/03/2019   CO2 29 08/03/2019   GLUCOSE 92 08/03/2019   BUN 15 08/03/2019   CREATININE 1.03 08/03/2019   BILITOT 0.5 08/03/2019   ALKPHOS 71 08/03/2019   AST 34 08/03/2019   ALT 73 (H) 08/03/2019   PROT 7.6 08/03/2019   ALBUMIN 4.3 08/03/2019   CALCIUM 9.6 08/03/2019   ANIONGAP 9 08/31/2016   GFR 95.22 08/03/2019   Lab Results  Component Value Date   CHOL 313 (H) 07/22/2019   Lab Results  Component Value Date   HDL 132.80 07/22/2019   Lab Results  Component Value Date   LDLCALC 147 (H) 07/22/2019   Lab Results  Component Value Date   TRIG 163.0 (H) 07/22/2019   Lab Results  Component Value Date   CHOLHDL 2 07/22/2019   No results found for: HGBA1C    Assessment & Plan:   Problem List Items Addressed This Visit      Other   Elevated LFTs - Primary   Relevant Orders   Comprehensive metabolic panel (Completed)   Hepatitis C antibody (Completed)   Gamma GT (Completed)   Hepatitis B Surface AntiGEN (Completed)   Hepatitis B surface antibody,quantitative (Completed)   07/24/2019 Abdomen Complete   Elevated LDL cholesterol level      No orders of the  defined types were placed in this encounter.   Follow-up: Return call and make an appointment for labs and with [facility]..   Patient is planning on transferring care to [facility].  He will call and make an appointment for his blood work and to see [facility] in the next 2 to 3 months.  Asked him to abstain  from alcohol for 2 to 3 days prior to having both labs drawn.  I asked him to lower the fat and cholesterol in his diet and information on preventing high cholesterol downloaded to my chart for him.  Virtual Visit via Video Note  I connected with [name] on 08/04/19 at 10:00 AM EST by a video enabled telemedicine application and verified that I am speaking with the correct person using two identifiers.  Location: Patient: home with significant other. Provider:    I discussed the limitations of evaluation and management by telemedicine and the availability of in person appointments. The patient expressed understanding and agreed to proceed.  History of Present Illness:    Observations/Objective:   Assessment and Plan:   Follow Up Instructions:    I discussed the assessment and treatment plan with the patient. The patient was provided an opportunity to ask questions and all were answered. The patient agreed with the plan and demonstrated an understanding of the instructions.   The patient was advised to call back or seek an in-person evaluation if the symptoms worsen or if the condition fails to improve as anticipated.  I provided 25 minutes of non-face-to-face time during this encounter.   Mliss Sax, MD  Interactive video and audio telecommunications were attempted between myself and the patient. However they failed due to the patient having technical difficulties or not having access to video capability. We continued and completed with audio only.Interactive video and audio telecommunications were attempted between myself and the patient. However they failed  due to the patient having technical difficulties or not having access to video capability. We continued and completed with audio only. Mliss Sax, MD

## 2019-07-29 NOTE — Telephone Encounter (Signed)
Patient is scheduled to see Josephine Igo, DO on tomorrow 07/30/19 @ 11:30. Patient has been informed of this information.   Thanks

## 2019-07-29 NOTE — Telephone Encounter (Signed)
Thank you for taking such good care of him.  He has been asking our office for disability paperwork, but we have declined to do that.  Take a look at other telephone notes from our clinic for the full story.  Malachi Bonds, MD [facility]

## 2019-07-29 NOTE — Patient Instructions (Signed)
Preventing High Cholesterol Cholesterol is a white, waxy substance similar to fat that the human body needs to help build cells. The liver makes all the cholesterol that a person's body needs. Having high cholesterol (hypercholesterolemia) increases a person's risk for heart disease and stroke. Extra (excess) cholesterol comes from the food the person eats. High cholesterol can often be prevented with diet and lifestyle changes. If you already have high cholesterol, you can control it with diet and lifestyle changes and with medicine. How can high cholesterol affect me? If you have high cholesterol, deposits (plaques) may build up on the walls of your arteries. The arteries are the blood vessels that carry blood away from your heart. Plaques make the arteries narrower and stiffer. This can limit or block blood flow and cause blood clots to form. Blood clots:  Are tiny balls of cells that form in your blood.  Can move to the heart or brain, causing a heart attack or stroke. Plaques in arteries greatly increase your risk for heart attack and stroke.Making diet and lifestyle changes can reduce your risk for these conditions that may threaten your life. What can increase my risk? This condition is more likely to develop in people who:  Eat foods that are high in saturated fat or cholesterol. Saturated fat is mostly found in: ? Foods that contain animal fat, such as red meat and some dairy products. ? Certain fatty foods made from plants, such as tropical oils.  Are overweight.  Are not getting enough exercise.  Have a family history of high cholesterol. What actions can I take to prevent this? Nutrition   Eat less saturated fat.  Avoid trans fats (partially hydrogenated oils). These are often found in margarine and in some baked goods, fried foods, and snacks bought in packages.  Avoid precooked or cured meat, such as sausages or meat loaves.  Avoid foods and drinks that have added  sugars.  Eat more fruits, vegetables, and whole grains.  Choose healthy sources of protein, such as fish, poultry, lean cuts of red meat, beans, peas, lentils, and nuts.  Choose healthy sources of fat, such as: ? Nuts. ? Vegetable oils, especially olive oil. ? Fish that have healthy fats (omega-3 fatty acids), such as mackerel or salmon. The items listed above may not be a complete list of recommended foods and beverages. Contact a dietitian for more information. Lifestyle  Lose weight if you are overweight. Losing 5-10 lb (2.3-4.5 kg) can help prevent or control high cholesterol. It can also lower your risk for diabetes and high blood pressure. Ask your health care provider to help you with a diet and exercise plan to lose weight safely.  Do not use any products that contain nicotine or tobacco, such as cigarettes, e-cigarettes, and chewing tobacco. If you need help quitting, ask your health care provider.  Limit your alcohol intake. ? Do not drink alcohol if:  Your health care provider tells you not to drink.  You are pregnant, may be pregnant, or are planning to become pregnant. ? If you drink alcohol:  Limit how much you use to:  0-1 drink a day for women.  0-2 drinks a day for men.  Be aware of how much alcohol is in your drink. In the [place] one drink equals one 12 oz bottle of beer (355 mL), one 5 oz glass of wine (148 mL), or one 1 oz glass of hard liquor (44 mL). Activity   Get enough exercise. Each week, do at   least 150 minutes of exercise that takes a medium level of effort (moderate-intensity exercise). ? This is exercise that:  Makes your heart beat faster and makes you breathe harder than usual.  Allows you to still be able to talk. ? You could exercise in short sessions several times a day or longer sessions a few times a week. For example, on 5 days each week, you could walk fast or ride your bike 3 times a day for 10 minutes each time.  Do exercises as told  by your health care provider. Medicines  In addition to diet and lifestyle changes, your health care provider may recommend medicines to help lower cholesterol. This may be a medicine to lower the amount of cholesterol your liver makes. You may need medicine if: ? Diet and lifestyle changes do not lower your cholesterol enough. ? You have high cholesterol and other risk factors for heart disease or stroke.  Take over-the-counter and prescription medicines only as told by your health care provider. General information  Manage your risk factors for high cholesterol. Talk with your health care provider about all your risk factors and how to lower your risk.  Manage other conditions that you have, such as diabetes or high blood pressure (hypertension).  Have blood tests to check your cholesterol levels at regular points in time as told by your health care provider.  Keep all follow-up visits as told by your health care provider. This is important. Where to find more information  American Heart Association: www.heart.org  National Heart, Lung, and Blood Institute: www.nhlbi.nih.gov Summary  High cholesterol increases your risk for heart disease and stroke. By keeping your cholesterol level low, you can reduce your risk for these conditions.  High cholesterol can often be prevented with diet and lifestyle changes.  Work with your health care provider to manage your risk factors, and have your blood tested regularly. This information is not intended to replace advice given to you by your health care provider. Make sure you discuss any questions you have with your health care provider. Document Revised: 09/05/2018 Document Reviewed: 01/21/2016 Elsevier Patient Education  2020 Elsevier Inc.  

## 2019-07-29 NOTE — Telephone Encounter (Signed)
I have documented in a previous telephone contact this information.   Thanks

## 2019-07-29 NOTE — Telephone Encounter (Signed)
[name] do you mind printing the office notes for Jordan [name] from January 1st to the most recent office visit and mail them to his home please? Thank You.

## 2019-07-29 NOTE — Telephone Encounter (Signed)
Appt scheduled for 07/29/19

## 2019-07-29 NOTE — Telephone Encounter (Signed)
Patient called and wants to see if we have schedule a appointment for the pulmonologist. [phone]

## 2019-07-30 ENCOUNTER — Telehealth: Payer: Self-pay | Admitting: Pulmonary Disease

## 2019-07-30 ENCOUNTER — Ambulatory Visit
Admission: EM | Admit: 2019-07-30 | Discharge: 2019-07-30 | Disposition: A | Discharge status: Home / Self Care | Payer: BC Managed Care – PPO | Attending: Emergency Medicine | Admitting: Emergency Medicine

## 2019-07-30 ENCOUNTER — Encounter: Payer: Self-pay | Admitting: Pulmonary Disease

## 2019-07-30 ENCOUNTER — Other Ambulatory Visit: Payer: Self-pay

## 2019-07-30 ENCOUNTER — Telehealth: Payer: Self-pay | Admitting: Internal Medicine

## 2019-07-30 ENCOUNTER — Encounter: Payer: Self-pay | Admitting: Emergency Medicine

## 2019-07-30 ENCOUNTER — Ambulatory Visit (INDEPENDENT_AMBULATORY_CARE_PROVIDER_SITE_OTHER): Payer: BC Managed Care – PPO | Admitting: Pulmonary Disease

## 2019-07-30 VITALS — BP 116/80 | HR 81 | Temp 97.2°F | Ht 69.0 in | Wt 178.2 lb

## 2019-07-30 DIAGNOSIS — K029 Dental caries, unspecified: Secondary | ICD-10-CM

## 2019-07-30 DIAGNOSIS — J302 Other seasonal allergic rhinitis: Secondary | ICD-10-CM | POA: Diagnosis not present

## 2019-07-30 DIAGNOSIS — J4550 Severe persistent asthma, uncomplicated: Secondary | ICD-10-CM | POA: Diagnosis not present

## 2019-07-30 DIAGNOSIS — J3089 Other allergic rhinitis: Secondary | ICD-10-CM | POA: Diagnosis not present

## 2019-07-30 DIAGNOSIS — K0889 Other specified disorders of teeth and supporting structures: Secondary | ICD-10-CM

## 2019-07-30 DIAGNOSIS — H1010 Acute atopic conjunctivitis, unspecified eye: Secondary | ICD-10-CM

## 2019-07-30 LAB — CBC WITH DIFFERENTIAL/PLATELET

## 2019-07-30 LAB — IGE: IgE (Immunoglobulin E), Serum: 72 IU/mL (ref 6–495)

## 2019-07-30 MED ORDER — IBUPROFEN 800 MG PO TABS: 800.0000 mg | ORAL_TABLET | Freq: Three times a day (TID) | ORAL | 0 refills | Status: DC

## 2019-07-30 MED ORDER — AMOXICILLIN 500 MG PO CAPS: 500.0000 mg | ORAL_CAPSULE | Freq: Two times a day (BID) | ORAL | 0 refills | Status: AC

## 2019-07-30 NOTE — Discharge Instructions (Signed)
Low-Cost [facility]:  [facility] Address: [address], [place], [place], [address] Phone: [phone]  - Dr. Civils Address: [address], , [place], [address] Phone: [phone] 

## 2019-07-30 NOTE — Telephone Encounter (Signed)
Call to patient to give test results of the D-Dimer, pt mentioned that his job was trying to "throw at him" the [employer] disability act.  Pt just wanted [place] to know.

## 2019-07-30 NOTE — Telephone Encounter (Signed)
error 

## 2019-07-30 NOTE — Patient Instructions (Signed)
Thank you for visiting Dr. Tonia Brooms at [facility]. Today we recommend the following:  Continue with current inhaler regimen  Work note today  Call us or Dr. Selena Batten if you are having flare of symptoms.   Return in about 3 months (around 10/30/2019) for with APP or Dr. Tonia Brooms.    Please do your part to reduce the spread of COVID-19.

## 2019-07-30 NOTE — Progress Notes (Signed)
Synopsis: Referred in March 2021 for shortness of breath, asthma by Alfonse Spruce, *  Subjective:   PATIENT ID: Jordan [name] GENDER: male DOB: [birth year], MRN: [id number]  Chief Complaint  Patient presents with  . Consult    Patient is here for shortness of breath. Patient feels like he can never get a full breath. Patient recently started nebulizer treatments and it has helped some.     44 year old gentleman past medical history of asthma gastroesophageal reflux.  [facility]  Last seen by Dr. Selena Batten.  Patient is currently on ICS LABA LAMA, triple therapy plus Singulair.  He did skin testing in the past.  IgE of 71.  Had blood work with CBC differential from March 1 but this is still pending.  He also has had spirometry in their office which revealed normal FEV1 FVC.  Patient here today for evaluation of ongoing shortness of breath and asthma management.  Per the patient after switching to breztri 2 weeks ago his symptoms are better controlled.  He has been using his nebulizer regularly.  I did explain to the patient I felt like it was a little too early to see whether or not he was going to have any ongoing improvement on his current regimen as long been 2 weeks since it was changed last.  He is now also off of prednisone taper.  His only complaint at this time is a central lysed chest discomfort that occurs when he feels like he cannot breathe and his chest is tight.  Otherwise this alleviates/dissipates with albuterol nebs.   Past Medical History:  Diagnosis Date  . Asthma   . Reflux esophagitis      Family History  Problem Relation Age of Onset  . Healthy Mother   . Healthy Father      Past Surgical History:  Procedure Laterality Date  . NO PAST SURGERIES      Social History   Socioeconomic History  . Marital status: Married    Spouse name: Not on file  . Number of children: Not on file  . Years of education: Not on file  . Highest  education level: Not on file  Occupational History  . Not on file  Tobacco Use  . Smoking status: Never Smoker  . Smokeless tobacco: Never Used  Substance and Sexual Activity  . Alcohol use: Yes    Comment: occ  . Drug use: No  . Sexual activity: Yes  Other Topics Concern  . Not on file  Social History Narrative  . Not on file   Social Determinants of Health   Financial Resource Strain:   . Difficulty of Paying Living Expenses: Not on file  Food Insecurity:   . Worried About Programme researcher, broadcasting/film/video in the Last Year: Not on file  . Ran Out of Food in the Last Year: Not on file  Transportation Needs:   . Lack of Transportation (Medical): Not on file  . Lack of Transportation (Non-Medical): Not on file  Physical Activity:   . Days of Exercise per Week: Not on file  . Minutes of Exercise per Session: Not on file  Stress:   . Feeling of Stress : Not on file  Social Connections:   . Frequency of Communication with Friends and Family: Not on file  . Frequency of Social Gatherings with Friends and Family: Not on file  . Attends Religious Services: Not on file  . Active Member of Clubs or Organizations: Not  on file  . Attends Banker Meetings: Not on file  . Marital Status: Not on file  Intimate Partner Violence:   . Fear of Current or Ex-Partner: Not on file  . Emotionally Abused: Not on file  . Physically Abused: Not on file  . Sexually Abused: Not on file     No Known Allergies   Outpatient Medications Prior to Visit  Medication Sig Dispense Refill  . albuterol (PROAIR HFA) 108 (90 Base) MCG/ACT inhaler Inhale 1-2 puffs into the lungs every 6 (six) hours as needed for wheezing or shortness of breath. 8 g 0  . amLODipine (NORVASC) 5 MG tablet Take 1 tablet (5 mg total) by mouth daily. 90 tablet 0  . Budeson-Glycopyrrol-Formoterol (BREZTRI AEROSPHERE) 160-9-4.8 MCG/ACT AERO Inhale 2 puffs into the lungs in the morning and at bedtime. 10.7 g 5  . Fluticasone  Propionate (XHANCE) 93 MCG/ACT EXHU Place 2 sprays into the nose 2 (two) times daily. 32 mL 2  . ipratropium-albuterol (DUONEB) 0.5-2.5 (3) MG/3ML SOLN Take 3 mLs by nebulization every 6 (six) hours as needed (wheezing, shortness of breath, chest tightness). 360 mL 0  . montelukast (SINGULAIR) 10 MG tablet Take 1 tablet (10 mg total) by mouth at bedtime. 90 tablet 1  . pantoprazole (PROTONIX) 20 MG tablet Take 1 tablet (20 mg total) by mouth daily. 30 tablet 1  . Spacer/Aero-Hold Chamber Mask MISC 1 each by Other route 3 times/day as needed-between meals & bedtime. 1 each 0   No facility-administered medications prior to visit.    Review of Systems  Constitutional: Negative for chills, fever, malaise/fatigue and weight loss.  HENT: Negative for hearing loss, sore throat and tinnitus.   Eyes: Negative for blurred vision and double vision.  Respiratory: Positive for shortness of breath and wheezing. Negative for cough, hemoptysis, sputum production and stridor.   Cardiovascular: Negative for chest pain, palpitations, orthopnea, leg swelling and PND.  Gastrointestinal: Negative for abdominal pain, constipation, diarrhea, heartburn, nausea and vomiting.  Genitourinary: Negative for dysuria, hematuria and urgency.  Musculoskeletal: Negative for joint pain and myalgias.  Skin: Negative for itching and rash.  Neurological: Negative for dizziness, tingling, weakness and headaches.  Endo/Heme/Allergies: Negative for environmental allergies. Does not bruise/bleed easily.  Psychiatric/Behavioral: Negative for depression. The patient is not nervous/anxious and does not have insomnia.   All other systems reviewed and are negative.    Objective:  Physical Exam Vitals reviewed.  Constitutional:      General: He is not in acute distress.    Appearance: He is well-developed.  HENT:     Head: Normocephalic and atraumatic.  Eyes:     General: No scleral icterus.    Conjunctiva/sclera: Conjunctivae  normal.     Pupils: Pupils are equal, round, and reactive to light.  Neck:     Vascular: No JVD.     Trachea: No tracheal deviation.  Cardiovascular:     Rate and Rhythm: Normal rate and regular rhythm.     Heart sounds: Normal heart sounds. No murmur.  Pulmonary:     Effort: Pulmonary effort is normal. No tachypnea, accessory muscle usage or respiratory distress.     Breath sounds: Normal breath sounds. No stridor. No wheezing, rhonchi or rales.  Abdominal:     General: Bowel sounds are normal.     Palpations: Abdomen is soft.  Musculoskeletal:        General: No tenderness.     Cervical back: Neck supple.  Lymphadenopathy:  Cervical: No cervical adenopathy.  Skin:    General: Skin is warm and dry.     Capillary Refill: Capillary refill takes less than 2 seconds.     Findings: No rash.  Neurological:     Mental Status: He is alert and oriented to person, place, and time.  Psychiatric:        Behavior: Behavior normal.      Vitals:   07/30/19 1120  BP: 116/80  Pulse: 81  Temp: (!) 97.2 F (36.2 C)  TempSrc: Temporal  SpO2: 97%  Weight: 178 lb 3.2 oz (80.8 kg)  Height: 5\' 9"  (1.753 m)   97% on RA BMI Readings from Last 3 Encounters:  07/30/19 26.32 kg/m  07/29/19 25.84 kg/m  07/27/19 26.49 kg/m   Wt Readings from Last 3 Encounters:  07/30/19 178 lb 3.2 oz (80.8 kg)  07/29/19 175 lb (79.4 kg)  07/20/19 179 lb 6.4 oz (81.4 kg)    CBC    Component Value Date/Time   WBC WILL FOLLOW 07/27/2019 1451   WBC 9.1 07/22/2019 1101   RBC WILL FOLLOW 07/27/2019 1451   RBC 4.69 07/22/2019 1101   HGB WILL FOLLOW 07/27/2019 1451   HCT WILL FOLLOW 07/27/2019 1451   PLT WILL FOLLOW 07/27/2019 1451   MCV WILL FOLLOW 07/27/2019 1451   MCH WILL FOLLOW 07/27/2019 1451   MCH 29.6 08/31/2016 1642   MCHC WILL FOLLOW 07/27/2019 1451   MCHC 33.5 07/22/2019 1101   RDW WILL FOLLOW 07/27/2019 1451   LYMPHSABS WILL FOLLOW 07/27/2019 1451   EOSABS WILL FOLLOW 07/27/2019  1451   BASOSABS WILL FOLLOW 07/27/2019 1451    Chest Imaging: Chest x-ray 07/07/2019: Clear bilaterally no infiltrate.  Pulmonary Functions Testing Results: No flowsheet data found.     Assessment & Plan:     ICD-10-CM   1. Severe persistent asthma without complication  W09.81   2. Seasonal and perennial allergic rhinoconjunctivitis  J30.2    H10.10    J30.89     Discussion:  This is a 44 year old gentleman with severe persistent asthma symptoms.  IgE 71.  Has a CBC with differential pending to look at eosinophil counts.  Results are not back yet.  [facility]  Currently on triple therapy plus Singulair.  He was just switched to a new inhaler regimen 2 weeks ago.  Per the patient he is doing better on this.  He does feel better using his nebulized albuterol when his chest tightness occurs.  Plan: I feel like is a little too early to make any changes to his inhaler regimen as is only been 2 weeks and he is seeming to improve. I think he should continue his current triple therapy regimen. At some point if he has elevated absolute eosinophils could consider biologic therapy. I feel also that it is too early to make this decision with his recent changes in inhaler regimen and current improvement. Continue use of albuterol for shortness of breath and wheezing. Continue Singulair.  Please give [place] a call and update [place] if you are having recurrent asthma flare symptoms. Follow-up with allergy clinic. Return to see [place] in approximately 2 to 3 months or based on symptoms.  Greater than 50% of this patient's 47-minute office visit was been face-to-face discussing above recommendations and treatment plan.  Review of office notes from Dr. Maudie Mercury.  Review of chest imaging.  Also independent review of office-based spirometry.   Current Outpatient Medications:  .  albuterol (PROAIR  HFA) 108 (90 Base) MCG/ACT inhaler, Inhale 1-2 puffs into the lungs every 6 (six) hours  as needed for wheezing or shortness of breath., Disp: 8 g, Rfl: 0 .  amLODipine (NORVASC) 5 MG tablet, Take 1 tablet (5 mg total) by mouth daily., Disp: 90 tablet, Rfl: 0 .  Budeson-Glycopyrrol-Formoterol (BREZTRI AEROSPHERE) 160-9-4.8 MCG/ACT AERO, Inhale 2 puffs into the lungs in the morning and at bedtime., Disp: 10.7 g, Rfl: 5 .  Fluticasone Propionate (XHANCE) 93 MCG/ACT EXHU, Place 2 sprays into the nose 2 (two) times daily., Disp: 32 mL, Rfl: 2 .  ipratropium-albuterol (DUONEB) 0.5-2.5 (3) MG/3ML SOLN, Take 3 mLs by nebulization every 6 (six) hours as needed (wheezing, shortness of breath, chest tightness)., Disp: 360 mL, Rfl: 0 .  montelukast (SINGULAIR) 10 MG tablet, Take 1 tablet (10 mg total) by mouth at bedtime., Disp: 90 tablet, Rfl: 1 .  pantoprazole (PROTONIX) 20 MG tablet, Take 1 tablet (20 mg total) by mouth daily., Disp: 30 tablet, Rfl: 1 .  Spacer/Aero-Hold Chamber Mask MISC, 1 each by Other route 3 times/day as needed-between meals & bedtime., Disp: 1 each, Rfl: 0   Josephine Igo, DO [facility] 07/30/2019 11:31 AM

## 2019-07-30 NOTE — ED Provider Notes (Signed)
[name]    CSN: [id number] Arrival date & time: 07/30/19  1619      History   Chief Complaint Chief Complaint  Patient presents with  . Dental Pain    HPI Jordan [name] is a 44 y.o. male presenting for right rear lower dental pain/right jaw pain for the last 3 days.  States he had difficulty opening his mouth and chewing foods.  Patient states he is missing several teeth; does not have a dentist currently.  Patient denies ear pain, fever, nasal congestion, trauma to the area.  No halitosis or discharge.  Denies history of dental or peritonsillar abscess.  Has tried 200 mg ibuprofen, 325 mg Tylenol daily without relief.   Past Medical History:  Diagnosis Date  . Asthma   . Reflux esophagitis     Patient Active Problem List   Diagnosis Date Noted  . Elevated LFTs 07/29/2019  . Elevated LDL cholesterol level 07/29/2019  . SOB (shortness of breath) 07/27/2019  . Seasonal and perennial allergic rhinoconjunctivitis 07/20/2019  . Not well controlled moderate persistent asthma 07/20/2019  . Heartburn 07/20/2019  . Stress and adjustment reaction 07/14/2019  . Healthcare maintenance 07/14/2019  . Hypertension 07/14/2019    Past Surgical History:  Procedure Laterality Date  . NO PAST SURGERIES         Home Medications    Prior to Admission medications   Medication Sig Start Date End Date Taking? Authorizing Provider  albuterol (PROAIR HFA) 108 (90 Base) MCG/ACT inhaler Inhale 1-2 puffs into the lungs every 6 (six) hours as needed for wheezing or shortness of breath. 06/14/19   Zigmund Gottron, NP  amLODipine (NORVASC) 5 MG tablet Take 1 tablet (5 mg total) by mouth daily. 07/14/19   Libby Maw, MD  amoxicillin (AMOXIL) 500 MG capsule Take 1 capsule (500 mg total) by mouth 2 (two) times daily for 7 days. 07/30/19 08/06/19  Hall-Potvin, [place], PA-C  Budeson-Glycopyrrol-Formoterol (BREZTRI AEROSPHERE) 160-9-4.8 MCG/ACT AERO Inhale 2 puffs into the  lungs in the morning and at bedtime. 07/20/19   Garnet Sierras, DO  Fluticasone Propionate Truett Perna) 93 MCG/ACT EXHU Place 2 sprays into the nose 2 (two) times daily. 07/27/19   Garnet Sierras, DO  ibuprofen (ADVIL) 800 MG tablet Take 1 tablet (800 mg total) by mouth 3 (three) times daily. 07/30/19   Hall-Potvin, [place], PA-C  ipratropium-albuterol (DUONEB) 0.5-2.5 (3) MG/3ML SOLN Take 3 mLs by nebulization every 6 (six) hours as needed (wheezing, shortness of breath, chest tightness). 07/25/19   Valentina Shaggy, MD  montelukast (SINGULAIR) 10 MG tablet Take 1 tablet (10 mg total) by mouth at bedtime. 06/10/19   Kennith Gain, MD  pantoprazole (PROTONIX) 20 MG tablet Take 1 tablet (20 mg total) by mouth daily. 07/20/19   Garnet Sierras, DO  Spacer/Aero-Hold Chamber Mask MISC 1 each by Other route 3 times/day as needed-between meals & bedtime. 07/25/19   Valentina Shaggy, MD  cetirizine (ZYRTEC ALLERGY) 10 MG tablet Take 1 tablet (10 mg total) by mouth daily. Patient not taking: Reported on 06/10/2019 03/22/19 06/13/19  Jaynee Eagles, PA-C    Family History Family History  Problem Relation Age of Onset  . Healthy Mother   . Healthy Father     Social History Social History   Tobacco Use  . Smoking status: Never Smoker  . Smokeless tobacco: Never Used  Substance Use Topics  . Alcohol use: Yes    Comment: occ  . Drug use:  No     Allergies   Patient has no known allergies.   Review of Systems As per HPI   Physical Exam Triage Vital Signs ED Triage Vitals  Enc Vitals Group     BP 07/30/19 1630 133/88     Pulse Rate 07/30/19 1630 81     Resp 07/30/19 1630 16     Temp 07/30/19 1630 98.4 F (36.9 C)     Temp Source 07/30/19 1630 Oral     SpO2 07/30/19 1630 96 %     Weight --      Height --      Head Circumference --      Peak Flow --      Pain Score 07/30/19 1639 6     Pain Loc --      Pain Edu? --      Excl. in GC? --    No data found.  Updated Vital Signs BP  133/88 (BP Location: Right Arm)   Pulse 81   Temp 98.4 F (36.9 C) (Oral)   Resp 16   SpO2 96%   Visual Acuity Right Eye Distance:   Left Eye Distance:   Bilateral Distance:    Right Eye Near:   Left Eye Near:    Bilateral Near:     Physical Exam Constitutional:      General: He is not in acute distress. HENT:     Head: Normocephalic and atraumatic.     Right Ear: Tympanic membrane, ear canal and external ear normal.     Left Ear: Tympanic membrane, ear canal and external ear normal.     Ears:     Comments: Negative tragal tenderness bilaterally    Nose: Nose normal.     Comments: Negative sinus tenderness bilaterally    Mouth/Throat:     Mouth: Mucous membranes are moist.     Pharynx: Oropharynx is clear.     Comments: Poor dentition with numerous teeth missing.  Right lower rear molar with significant decay to gumline.  TTP without fluctuance, purulent discharge.  Patient does have decreased ROM of jaw.  No TMJ tenderness or crepitus. Eyes:     General: No scleral icterus.    Pupils: Pupils are equal, round, and reactive to light.  Cardiovascular:     Rate and Rhythm: Normal rate.  Pulmonary:     Effort: Pulmonary effort is normal. No respiratory distress.     Breath sounds: No wheezing.  Musculoskeletal:     Cervical back: Normal range of motion and neck supple. No tenderness.  Lymphadenopathy:     Cervical: No cervical adenopathy.  Skin:    Coloration: Skin is not jaundiced or pale.  Neurological:     Mental Status: He is alert and oriented to person, place, and time.      UC Treatments / Results  Labs (all labs ordered are listed, but only abnormal results are displayed) Labs Reviewed - No data to display  EKG   Radiology No results found.  Procedures Procedures (including critical care time)  Medications Ordered in UC Medications - No data to display  Initial Impression / Assessment and Plan / UC Course  I have reviewed the triage vital signs  and the nursing notes.  Pertinent labs & imaging results that were available during my care of the patient were reviewed by me and considered in my medical decision making (see chart for details).     H&P concerning for dental infection second to significant decay  in cavitations.  Provided low cost community dental resources.  Will start amoxicillin today.  Increase ibuprofen dosing.  Patient requesting work note: Stated can provide work note stating he is here today, return to work same day given numerous UC visits with request for work notes.  Return precautions discussed, patient verbalized understanding and is agreeable to plan. Final Clinical Impressions(s) / UC Diagnoses   Final diagnoses:  Dental decay  Pain, dental     Discharge Instructions     Low-Cost Community Dental Resources:  [facility] Address: [address], [place], [place], [address] Phone: [phone]  - Dr. Lawrence Marseilles Address: [address] [place], [place], [address] Phone: [phone]    ED Prescriptions    Medication Sig Dispense Auth. Provider   ibuprofen (ADVIL) 800 MG tablet Take 1 tablet (800 mg total) by mouth 3 (three) times daily. 21 tablet Hall-Potvin, [place], PA-C   amoxicillin (AMOXIL) 500 MG capsule Take 1 capsule (500 mg total) by mouth 2 (two) times daily for 7 days. 14 capsule Hall-Potvin, [place], PA-C     PDMP not reviewed this encounter.   Hall-Potvin, [place], [place] 07/30/19 1702

## 2019-07-30 NOTE — Telephone Encounter (Signed)
Called and spoke with Patient.  Patient stated he is needing a note for work stating he is not a health risk to himself or others with his chest soreness.   Message routed to Dr. Tonia Brooms to advise

## 2019-07-30 NOTE — ED Triage Notes (Addendum)
Pt presents to [facility] for assessment of right sided dental pain/gum swelling x 3 days.

## 2019-07-31 ENCOUNTER — Other Ambulatory Visit: Payer: Self-pay

## 2019-07-31 ENCOUNTER — Telehealth: Payer: Self-pay | Admitting: Emergency Medicine

## 2019-07-31 ENCOUNTER — Other Ambulatory Visit: Payer: BC Managed Care – PPO

## 2019-07-31 DIAGNOSIS — J4540 Moderate persistent asthma, uncomplicated: Secondary | ICD-10-CM

## 2019-07-31 NOTE — Telephone Encounter (Signed)
Spoke with pt, advised him that the letter was ready to pick up. Pt understood and nothing further is needed.

## 2019-07-31 NOTE — Telephone Encounter (Signed)
Called to check in on patient, discussed medications, and encouraged return call with any continuing questions or concerns.

## 2019-07-31 NOTE — Telephone Encounter (Signed)
Yes ok for work note. BLI

## 2019-08-03 ENCOUNTER — Other Ambulatory Visit: Payer: Self-pay

## 2019-08-03 ENCOUNTER — Telehealth: Payer: Self-pay

## 2019-08-03 ENCOUNTER — Other Ambulatory Visit (INDEPENDENT_AMBULATORY_CARE_PROVIDER_SITE_OTHER): Payer: BC Managed Care – PPO

## 2019-08-03 DIAGNOSIS — Z0184 Encounter for antibody response examination: Secondary | ICD-10-CM | POA: Diagnosis not present

## 2019-08-03 DIAGNOSIS — Z1159 Encounter for screening for other viral diseases: Secondary | ICD-10-CM | POA: Diagnosis not present

## 2019-08-03 DIAGNOSIS — R7989 Other specified abnormal findings of blood chemistry: Secondary | ICD-10-CM

## 2019-08-03 LAB — COMPREHENSIVE METABOLIC PANEL
ALT: 73 U/L — ABNORMAL HIGH (ref 0–53)
AST: 34 U/L (ref 0–37)
Albumin: 4.3 g/dL (ref 3.5–5.2)
Alkaline Phosphatase: 71 U/L (ref 39–117)
BUN: 15 mg/dL (ref 6–23)
CO2: 29 mEq/L (ref 19–32)
Calcium: 9.6 mg/dL (ref 8.4–10.5)
Chloride: 97 mEq/L (ref 96–112)
Creatinine, Ser: 1.03 mg/dL (ref 0.40–1.50)
GFR: 95.22 mL/min (ref >60.00)
Glucose, Bld: 92 mg/dL (ref 70–99)
Potassium: 4.1 mEq/L (ref 3.5–5.1)
Sodium: 135 mEq/L (ref 135–145)
Total Bilirubin: 0.5 mg/dL (ref 0.2–1.2)
Total Protein: 7.6 g/dL (ref 6.0–8.3)

## 2019-08-03 LAB — GAMMA GT: GGT: 101 U/L — ABNORMAL HIGH (ref 7–51)

## 2019-08-03 NOTE — Telephone Encounter (Signed)
Patient came into the office today to drop off a form to be completed that he needs to return to [facility] Child Support Services. I spoke with the patient and made him aware that since he had been referred to pulmonary specialist, Dr. Delorse Lek may suggest that the form be completed by their office. Form placed in Dr. Randell Patient inbox for review.

## 2019-08-04 LAB — HEPATITIS B SURFACE ANTIGEN: Hepatitis B Surface Ag: NONREACTIVE

## 2019-08-04 LAB — HEPATITIS C ANTIBODY
Hepatitis C Ab: NONREACTIVE
SIGNAL TO CUT-OFF: 0.01 (ref <1.00)

## 2019-08-04 LAB — HEPATITIS B SURFACE ANTIBODY, QUANTITATIVE: Hep B S AB Quant (Post): <5 m[IU]/mL — ABNORMAL LOW (ref >=10)

## 2019-08-04 NOTE — Addendum Note (Signed)
Addended by: Andrez Grime on: 08/04/2019 12:54 PM   Modules accepted: Orders

## 2019-08-05 ENCOUNTER — Telehealth: Payer: Self-pay | Admitting: Allergy

## 2019-08-05 NOTE — Telephone Encounter (Signed)
He has an appointment scheduled for 08/07/2019.  I will need to discuss completion of his form with him at this visit.  And if able to complete due to necessity it will be ready for him at the end of the visit.

## 2019-08-05 NOTE — Telephone Encounter (Signed)
Patient called to see if you had sent his paperwork out to him yet, he is still waiting. [phone]

## 2019-08-05 NOTE — Telephone Encounter (Signed)
Called and advised to patient. Patient verbalized understanding and stated that he may drop off more paperwork tomorrow regarding Americans with Disability Act. Advised that we will make sure that Dr. Delorse Lek will receive it but may wait to address this at his office visit. Patient verbalized understanding.

## 2019-08-05 NOTE — Telephone Encounter (Signed)
Dr. Delorse Lek please advise and refer to telephone message from 08/03/2019. Thank You.

## 2019-08-07 ENCOUNTER — Encounter: Payer: Self-pay | Admitting: Allergy

## 2019-08-07 ENCOUNTER — Other Ambulatory Visit: Payer: Self-pay

## 2019-08-07 ENCOUNTER — Ambulatory Visit (INDEPENDENT_AMBULATORY_CARE_PROVIDER_SITE_OTHER): Payer: BC Managed Care – PPO | Admitting: Allergy

## 2019-08-07 VITALS — BP 128/80 | HR 90 | Temp 97.9°F | Resp 16

## 2019-08-07 DIAGNOSIS — J4540 Moderate persistent asthma, uncomplicated: Secondary | ICD-10-CM

## 2019-08-07 DIAGNOSIS — I10 Essential (primary) hypertension: Secondary | ICD-10-CM | POA: Diagnosis not present

## 2019-08-07 DIAGNOSIS — J3089 Other allergic rhinitis: Secondary | ICD-10-CM

## 2019-08-07 DIAGNOSIS — R12 Heartburn: Secondary | ICD-10-CM | POA: Diagnosis not present

## 2019-08-07 DIAGNOSIS — J302 Other seasonal allergic rhinitis: Secondary | ICD-10-CM | POA: Diagnosis not present

## 2019-08-07 DIAGNOSIS — H1010 Acute atopic conjunctivitis, unspecified eye: Secondary | ICD-10-CM

## 2019-08-07 NOTE — Patient Instructions (Addendum)
Asthma:  . Daily controller medication(s): continue Breztri 2 puffs twice a day with spacer and rinse mouth afterwards. o Continue Singulair 10mg  daily at night.  . Prior to physical activity: May use albuterol rescue inhaler 2 puffs 5 to 15 minutes prior to strenuous physical activities. Rescue medications: May use albuterol rescue inhaler 2 puffs or nebulizer every 4 to 6 hours as needed for shortness of breath, chest tightness, coughing, and wheezing. Monitor frequency of use. . We have obtained additional labwork today to assess for eosinophilia as well as other conditions that can worsen asthma.  At this time pending labs would recommend adding in a biologic asthma medication like Nucala to see if we can improve your symptoms.  Provided with brochure information for the biologic agent options.  Once labs return we can provide a sample dose in office to get started likely next week.  . Asthma control goals:  o Full participation in all desired activities (may need albuterol before activity) o Albuterol use two times or less a week on average (not counting use with activity) o Cough interfering with sleep two times or less a month o Oral steroids no more than once a year o No hospitalization  Heartburn  Continue pantoprazole 20mg  in the morning. Nothing to eat or drink for 30 minutes.  See below for heartburn lifestyle modification diet.  Seasonal and perennial allergic rhinoconjunctivitis Past history - 2021 skin testing was positive to grass, tree, mold, dust mites.  Stop Xhance due to nosebleeds  Try nasal Atrovent 2 sprays each nostril twice a day at this time to help with nasal congestion and drainage  Continue daily use of Xyzal (levocetirizine).  If you are having acute worsening of symptoms, please go to ER/urgent care. Follow up with your PCP regarding your liver testing and ultrasound   Follow-up in 6-8 weeks

## 2019-08-07 NOTE — Progress Notes (Signed)
Follow-up Note  RE: Jordan [name] MRN: [id number] DOB: 07-28-1975 Date of Office Visit: 08/07/2019   History of present illness: Jordan [name] is a 44 y.o. male presenting today for follow-up of asthma, allergic rhinitis with conjunctivitis and reflux.  He was seen for a sick visit last on 07/27/19 by Dr. Selena Batten.   Since this visit he did have a pulmonary evaluation.  He states he is no better.  He completed prednisone about 2 weeks ago.  He states he maybe felt a bit better with the prednisone but not significantly better.  He is taking Breztri 2 puffs twice a day and singulair daily.  Yet he still reports having chest tightness/pain, shortness of breath and cough.  He takes the Breztri around 7:30 and he states by 11:30 symptoms have returned at he is using his albuterol either inhaler or neb.  He states he is using the albuterol about every 4-6 hours.  He takes his pm Breztri around 830 and states he is feeling more symptomatic around 6pm.  He states he can't even do house work or he will have more chest tightness and need to use his albuterol.  He states he can not work as his job demands he be at 100% to operate his job.  He also reports he head feels clogged constantly.  He stopped using Xhance as it was cuasing nosebleeds and felt like it was worsenings symptoms.  He is taking xyzal daily.  He had a ED visit on 07/30/19 for dental pain and has been taking ibuprofren 800mg  twice a day which he states this does not help his chest pain that he feels.   He has had a normal CXR on 07/07/19.  Normal d-dimer level on 07/27/19.  He is undergoing further work-up for his elevated liver enzymes and states he is going to have a liver 09/26/19.    Review of systems: Review of Systems  Constitutional: Negative.   HENT: Positive for congestion.   Eyes: Negative.   Respiratory: Positive for cough and shortness of breath.   Cardiovascular: Positive for chest pain.  Gastrointestinal: Negative.   Musculoskeletal: Negative.     Skin: Negative.   Neurological: Negative.     All other systems negative unless noted above in HPI  Past medical/social/surgical/family history have been reviewed and are unchanged unless specifically indicated below.  No changes  Medication List: Current Outpatient Medications  Medication Sig Dispense Refill  . albuterol (PROAIR HFA) 108 (90 Base) MCG/ACT inhaler Inhale 1-2 puffs into the lungs every 6 (six) hours as needed for wheezing or shortness of breath. 8 g 0  . amLODipine (NORVASC) 5 MG tablet Take 1 tablet (5 mg total) by mouth daily. 90 tablet 0  . Budeson-Glycopyrrol-Formoterol (BREZTRI AEROSPHERE) 160-9-4.8 MCG/ACT AERO Inhale 2 puffs into the lungs in the morning and at bedtime. 10.7 g 5  . ibuprofen (ADVIL) 800 MG tablet Take 1 tablet (800 mg total) by mouth 3 (three) times daily. 21 tablet 0  . ipratropium-albuterol (DUONEB) 0.5-2.5 (3) MG/3ML SOLN Take 3 mLs by nebulization every 6 (six) hours as needed (wheezing, shortness of breath, chest tightness). 360 mL 0  . montelukast (SINGULAIR) 10 MG tablet Take 1 tablet (10 mg total) by mouth at bedtime. 90 tablet 1  . pantoprazole (PROTONIX) 20 MG tablet Take 1 tablet (20 mg total) by mouth daily. 30 tablet 1  . Spacer/Aero-Hold Chamber Mask MISC 1 each by Other route 3 times/day as needed-between meals & bedtime. 1 each  0   No current facility-administered medications for this visit.     Known medication allergies: No Known Allergies   Physical examination: Blood pressure 128/80, pulse 90, temperature 97.9 F (36.6 C), temperature source Temporal, resp. rate 16, SpO2 99 %.  General: Alert, interactive, in no acute distress. HEENT: PERRLA, TMs pearly gray, turbinates moderately edematous with clear discharge, post-pharynx non erythematous. Neck: Supple without lymphadenopathy. Lungs: Clear to auscultation without wheezing, rhonchi or rales. {no increased work of breathing. CV: Normal S1, S2 without murmurs. +TTP  midsternum Abdomen: Nondistended, nontender. Skin: Warm and dry, without lesions or rashes. Extremities:  No clubbing, cyanosis or edema. Neuro:   Grossly intact.  Diagnositics/Labs: Labs:  Component     Latest Ref Rng & Units 07/27/2019  IgE (Immunoglobulin E), Serum     6 - 495 IU/mL 72  D-dimer     0.00 - 0.49 mg/L FEU <0.20     Assessment and plan:   Asthma:  . Daily controller medication(s): continue Breztri 2 puffs twice a day with spacer and rinse mouth afterwards. o Continue Singulair 10mg  daily at night.  . Prior to physical activity: May use albuterol rescue inhaler 2 puffs 5 to 15 minutes prior to strenuous physical activities. Marland Kitchen Rescue medications: May use albuterol rescue inhaler 2 puffs or nebulizer every 4 to 6 hours as needed for shortness of breath, chest tightness, coughing, and wheezing. Monitor frequency of use. . We have obtained additional labwork today to assess for eosinophilia as well as other conditions that can worsen asthma (ie. ABPA, alpha1AT given abnormal liver function).  At this time pending labs would recommend adding in a biologic asthma medication like Nucala to see if we can improve your symptoms.  Provided with brochure information for the biologic agent options.  Once labs return we can provide a sample dose in office to get started likely next week.  . Discussed biologic agents including anti1L5 options and antiIgE ones.  Advised this would be the next step in regards to stepping up therapy.  Offered to start today with a sample while awaiting return of labwork however he declined and wanted my time to think about the biologic agents and mentally prepare himself. . Asthma control goals:  o Full participation in all desired activities (may need albuterol before activity) o Albuterol use two times or less a week on average (not counting use with activity) o Cough interfering with sleep two times or less a month o Oral steroids no more than once a  year o No hospitalization  Heartburn  Continue pantoprazole 20mg  in the morning. Nothing to eat or drink for 30 minutes.  See below for heartburn lifestyle modification diet.  Seasonal and perennial allergic rhinoconjunctivitis Past history - 2021 skin testing was positive to grass, tree, mold, dust mites.  Stop Xhance due to nosebleeds  Try nasal Atrovent 2 sprays each nostril twice a day at this time to help with nasal congestion and drainage  Continue daily use of Xyzal (levocetirizine).  Hypertension:  - on norvasc.    If symptoms continue to persist despite stepping up therapy then would recommend an cardiac evaluation for chest pain.   If you are having acute worsening of symptoms, please go to ER/urgent care. Follow up with your PCP regarding your liver testing and ultrasound   Follow-up in 6-8 weeks   I appreciate the opportunity to take part in Jasdeep's care. Please do not hesitate to contact me with questions.  Sincerely,   Kazumi Lachney  Nelva Bush, MD Allergy/Immunology [facility]

## 2019-08-12 NOTE — Telephone Encounter (Signed)
Disability forms are completed but unsure if we are to fax this to company and if so we will need the number to fax to a secure fax location of his disability paperwork. Patient can pick up in office if he choose to. Left message for patient to call the office back to discuss his options.

## 2019-08-12 NOTE — Telephone Encounter (Signed)
Patient is returning a call about disability forms. I informed him, per Wynona Canes, that his forms were complete and we could fax them or he could stop by the office and pick them up. He said he will stop by the office and pick them up this afternoon, 08/12/19. I let him know we closed at 5:00. He said he understood.

## 2019-08-13 ENCOUNTER — Telehealth: Payer: Self-pay | Admitting: *Deleted

## 2019-08-13 NOTE — Telephone Encounter (Signed)
Patient called and advised that when he takes his Prednisone that was given to him it helps better than all of his other medications but then it only lasts for 2.5 hours and starts to flare again at night between 6-9:30. Please advise.

## 2019-08-13 NOTE — Telephone Encounter (Signed)
This is why we talked about starting a biologic asthma medication at his last visit.  However it was not ready to receive a sample at the last visit.  I am still waiting on all of his labs to return to determine which biologic he would have the best benefit from.   The biologics are to help spare use of the steroids like prednisone or steroid injections.

## 2019-08-13 NOTE — Telephone Encounter (Signed)
Called and informed patient. Patient verbalized understanding.  

## 2019-08-18 ENCOUNTER — Other Ambulatory Visit: Payer: BC Managed Care – PPO

## 2019-08-18 LAB — ALPHA-1-ANTITRYPSIN DEFICIENCY

## 2019-08-18 LAB — CBC WITH DIFFERENTIAL/PLATELET
Basophils Absolute: 0 10*3/uL (ref 0.0–0.2)
Basos: 1 %
EOS (ABSOLUTE): 0.1 10*3/uL (ref 0.0–0.4)
Eos: 2 %
Hematocrit: 41.4 % (ref 37.5–51.0)
Hemoglobin: 13.7 g/dL (ref 13.0–17.7)
Immature Grans (Abs): 0 10*3/uL (ref 0.0–0.1)
Immature Granulocytes: 0 %
Lymphocytes Absolute: 1.6 10*3/uL (ref 0.7–3.1)
Lymphs: 39 %
MCH: 29.8 pg (ref 26.6–33.0)
MCHC: 33.1 g/dL (ref 31.5–35.7)
MCV: 90 fL (ref 79–97)
Monocytes Absolute: 0.3 10*3/uL (ref 0.1–0.9)
Monocytes: 7 %
Neutrophils Absolute: 2.1 10*3/uL (ref 1.4–7.0)
Neutrophils: 51 %
Platelets: 256 10*3/uL (ref 150–450)
RBC: 4.6 x10E6/uL (ref 4.14–5.80)
RDW: 14 % (ref 11.6–15.4)
WBC: 4.2 10*3/uL (ref 3.4–10.8)

## 2019-08-18 LAB — MPO/PR-3 (ANCA) ANTIBODIES
ANCA Proteinase 3: <3.5 U/mL (ref 0.0–3.5)
Myeloperoxidase Ab: <9 U/mL (ref 0.0–9.0)

## 2019-08-18 LAB — ASPERGILLUS PRECIPITINS
A.Fumigatus #1 Abs: NEGATIVE
Aspergillus Flavus Antibodies: NEGATIVE
Aspergillus Niger Antibodies: NEGATIVE
Aspergillus glaucus IgG: NEGATIVE
Aspergillus nidulans IgG: NEGATIVE
Aspergillus terreus IgG: NEGATIVE

## 2019-08-20 ENCOUNTER — Telehealth: Payer: Self-pay | Admitting: *Deleted

## 2019-08-20 NOTE — Telephone Encounter (Signed)
Tried to contact patient to discuss Nucala approval process, copay card and submit but no voicemail to leave message.  Need copay of his drug card in order to get approval

## 2019-08-21 ENCOUNTER — Telehealth: Payer: Self-pay | Admitting: *Deleted

## 2019-08-21 ENCOUNTER — Ambulatory Visit (INDEPENDENT_AMBULATORY_CARE_PROVIDER_SITE_OTHER): Payer: BC Managed Care – PPO | Admitting: *Deleted

## 2019-08-21 ENCOUNTER — Other Ambulatory Visit: Payer: Self-pay

## 2019-08-21 ENCOUNTER — Ambulatory Visit
Admission: RE | Admit: 2019-08-21 | Discharge: 2019-08-21 | Disposition: A | Discharge status: Home / Self Care | Payer: BC Managed Care – PPO | Source: Ambulatory Visit | Attending: Family Medicine | Admitting: Family Medicine

## 2019-08-21 DIAGNOSIS — R7989 Other specified abnormal findings of blood chemistry: Secondary | ICD-10-CM

## 2019-08-21 DIAGNOSIS — J4540 Moderate persistent asthma, uncomplicated: Secondary | ICD-10-CM

## 2019-08-21 DIAGNOSIS — J4550 Severe persistent asthma, uncomplicated: Secondary | ICD-10-CM | POA: Diagnosis not present

## 2019-08-21 MED ORDER — MEPOLIZUMAB 100 MG/ML ~~LOC~~ SOSY
100.0000 mg | PREFILLED_SYRINGE | Freq: Once | SUBCUTANEOUS | Status: AC
  Administered 2019-08-21: 100 mg via SUBCUTANEOUS

## 2019-08-21 MED ORDER — EPINEPHRINE 0.3 MG/0.3ML IJ SOAJ: 0.3000 mg | Freq: Once | INTRAMUSCULAR | 1 refills | Status: AC

## 2019-08-21 NOTE — Telephone Encounter (Signed)
Had [place] [facility] have patient call me since I was unable to reach patient 3/25 due to no voicemail. Advised patient need his drug card info in order to get Ins approval and submit for his next injection.  Also advised patient to fix voicemail so specialty pharmacy can reach out to patient and once he speaks them they will ship next dose

## 2019-08-21 NOTE — Telephone Encounter (Signed)
Patient left after waiting 30 minutes after his Nucala sample. Did not schedule next appointment yet since he stated that he was working to get all information to you for approval. Patient is aware that once medication is ordered we will get him scheduled for his next injection.

## 2019-08-21 NOTE — Progress Notes (Signed)
Immunotherapy   Patient Details  Name: Jordan [name] MRN: [id number] Date of Birth: 1975/07/23  08/21/2019  Jordan [name] started injections for  Nucala Prefilled autoinjector He will receive injections every 4 weeks. Epi-Pen:Prescription for Epi-Pen given. Patient waited 30 minutes in office post injection with  Consent signed and patient instructions given. Patient waited 30 minutes and did not experience any issues.    Exie Parody 08/21/2019, 10:28 AM

## 2019-08-23 ENCOUNTER — Telehealth: Payer: Self-pay | Admitting: Allergy

## 2019-08-23 NOTE — Telephone Encounter (Signed)
Please call patient to follow up and see if he needs to be see in person. Thank you!  Patient called stating that his breathing is not better after getting the Nucala injection. He has been coughing and feeling chest tightness. He has been using his nebulizer and albuterol HFA inhaler more.   I advised him to go to urgent care or ER to get evaluated but he states it's not that bad and will wait until Monday when the office is open to come in as a sick visit.   Recommended that he can use nebulizer every 4 hours as needed. If he has acute worsening symptoms then go to ER/urgent care.  Patient had a dry throat clearing cough at times during the phone conversation but he was talking in full sentences without any distress.

## 2019-08-24 ENCOUNTER — Telehealth: Payer: Self-pay | Admitting: Family Medicine

## 2019-08-24 NOTE — Telephone Encounter (Signed)
Patient is returning the call  For lab results. CB is [phone]

## 2019-08-24 NOTE — Telephone Encounter (Signed)
Patient was returning nurses call. Please call back.

## 2019-08-24 NOTE — Telephone Encounter (Signed)
Noted. Did we refer him to an academic center yet?   Malachi Bonds, MD [facility]

## 2019-08-24 NOTE — Telephone Encounter (Signed)
Attempted to call patient to see how he was doing. There was no answer and unfortunately the mailbox was full. Will need to attempt to call patient again.

## 2019-08-24 NOTE — Telephone Encounter (Signed)
Called and spoke with the patient and he stated that he is still not having an improvement with symptoms and is having chest tightness. Patient has been placed on the schedule for tomorrow with Dr. Dellis Anes. Patient verbalized understanding.

## 2019-08-25 ENCOUNTER — Ambulatory Visit (INDEPENDENT_AMBULATORY_CARE_PROVIDER_SITE_OTHER): Payer: BC Managed Care – PPO | Admitting: Allergy & Immunology

## 2019-08-25 ENCOUNTER — Encounter: Payer: Self-pay | Admitting: Allergy & Immunology

## 2019-08-25 ENCOUNTER — Other Ambulatory Visit: Payer: Self-pay

## 2019-08-25 VITALS — BP 146/98 | HR 92 | Temp 98.2°F | Resp 16

## 2019-08-25 DIAGNOSIS — J302 Other seasonal allergic rhinitis: Secondary | ICD-10-CM | POA: Diagnosis not present

## 2019-08-25 DIAGNOSIS — J3089 Other allergic rhinitis: Secondary | ICD-10-CM

## 2019-08-25 DIAGNOSIS — J4540 Moderate persistent asthma, uncomplicated: Secondary | ICD-10-CM | POA: Diagnosis not present

## 2019-08-25 DIAGNOSIS — R12 Heartburn: Secondary | ICD-10-CM | POA: Diagnosis not present

## 2019-08-25 DIAGNOSIS — H1010 Acute atopic conjunctivitis, unspecified eye: Secondary | ICD-10-CM

## 2019-08-25 NOTE — Telephone Encounter (Signed)
Have discussed plan with Dr Dellis Anes who will see him today for visit.

## 2019-08-25 NOTE — Progress Notes (Signed)
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 Adverse reaction to [facility]   [name] presents for a sick visit following a perceived reaction to his mepolizumab.  He is also endorsing continued shortness of breath, which has been an ongoing thing for him.  His spirometry is lower today with an FEV1 and FVC in the high 60s.  It does improve with the nebulizer treatment.  However, it should be noted that his effort was lackluster at best.  He never has any wheezing on his previous exams and this is no different today.  He might of clear his throat wants, but otherwise had no coughing.  We are going to get some inflammatory markers today as well as an ACE level to assess for sarcoidosis.  I highly doubt this is what is going on.  We are also going to refer to [facility] for a second opinion.  We are also going to obtain an EKG, but I think it would be best to talk to his PCP to see whether we can do that in their office.  I do not think he needs a full cardiology referral at this point.  A normal EKG would make [place] feel better about this chest pain he keeps endorsing.  Overall, I have a low suspicion for cardiac etiology.  Plan/Recommendations:   Asthma:  . Lung testing looked slightly lower today with levels in the 60-70% range.  . It did improve with the Xopenex puffs.  . We are going to get some labs to look for autoimmune causes of chest pain such as rheumatoid arthritis, lupus, or sarcoidosis.  . We will call you in 1-2 weeks with the results of the testing.  . We are going to refer you to see a team at [facility] so they can look at you from a different perspective.  [name] is working on that referral now, so hopefully they reach out to you in the next week to schedule you.  . Daily controller medication(s): continue Breztri 2 puffs twice a day with  spacer and rinse mouth afterwards. o Continue Singulair 10mg  daily at night.  . Prior to physical activity: May use albuterol rescue inhaler 2 puffs 5 to 15 minutes prior to strenuous physical activities. Marland Kitchen Rescue medications: May use albuterol rescue inhaler 2 puffs or nebulizer every 4 to 6 hours as needed for shortness of breath, chest tightness, coughing, and wheezing. Monitor frequency of use. . We have obtained additional labwork today to assess for eosinophilia as well as other conditions that can worsen asthma.  At this time pending labs would recommend adding in a biologic asthma medication like Nucala to see if we can improve your symptoms.  Provided with brochure information for the biologic agent options.  Once labs return we can provide a sample dose in office to get started likely next week.  . Asthma control goals:  o Full participation in all desired activities (may need albuterol before activity) o Albuterol use two times or less a week on average (not counting use with activity) o Cough interfering with sleep two times or less a month o Oral steroids no more than once a year o No hospitalization  Heartburn  Continue pantoprazole 20mg  in the morning. Nothing to eat or drink for 30 minutes.  See below for heartburn lifestyle modification diet.  Seasonal  and perennial allergic rhinoconjunctivitis Past history - 2021 skin testing was positive to grass, tree, mold, dust mites.  Continue with nasal Atrovent 2 sprays each nostril twice a day at this time to help with nasal congestion and drainage  Continue daily use of Xyzal (levocetirizine).     Subjective:   Jordan [name] is a 44 y.o. male presenting today for follow up of  Chief Complaint  Patient presents with  . Asthma    Jordan [name] has a history of the following: Patient Active Problem List   Diagnosis Date Noted  . Elevated LFTs 07/29/2019  . Elevated LDL cholesterol level 07/29/2019  . SOB (shortness of  breath) 07/27/2019  . Seasonal and perennial allergic rhinoconjunctivitis 07/20/2019  . Not well controlled moderate persistent asthma 07/20/2019  . Heartburn 07/20/2019  . Stress and adjustment reaction 07/14/2019  . Healthcare maintenance 07/14/2019  . Hypertension 07/14/2019    History obtained from: chart review and patient.  Jordan [name] is a 44 y.o. male presenting for a sick visit.  Mr. Carew is well-known to our practice.  He was last seen by his primary allergist, Dr. Delorse Lek, on March 12.  At that time, he was continuing with Breztri 2 puffs twice daily as well as Singulair 10 mg at night.  They also had a discussion about starting mepolizumab for additional control of his asthma.  He has had a large work-up for difficult to control asthma including ABPA and alpha 1 antitrypsin, which have all been normal.  He had ANCA levels drawn which were normal as well.  He was continued on pantoprazole 20 mg in the morning.  His allergic rhinitis, his Timmothy Sours was stopped due to nosebleeds and he was started on Atrovent 2 sprays per nostril twice daily.  He was also continued on Xyzal.  In the interim, he did receive his first mepolizumab injection on Friday March 26.  Today, he reports that he started having lightheadedness after his mepolizumab injection within hours.  He then started to have "irritation way more than it was before".  He perseverates on inflammation during the visit as well.  He has been using Tylenol for his headaches as well as the nebulizer machine to help with breathing.  He is using it every 3-4 hours, at least over the weekend.  He also remains on his maintenance inhaler 2 puffs twice daily.  He has not had any fevers during this time.  He reports quite a bit of coughing, although he does not cough at all during the visit today.  He has not had steroids in quite some time.  In the past, they have not seem to do anything.  For his allergic rhinitis, he remains on Atrovent 2 sprays per  nostril twice daily as well as Xyzal 5 mg daily.  Otherwise, there have been no changes to his past medical history, surgical history, family history, or social history.    Review of Systems  Constitutional: Negative.  Negative for chills, fever, malaise/fatigue and weight loss.  HENT: Negative.  Negative for congestion, ear discharge, ear pain and sore throat.   Eyes: Negative for pain, discharge and redness.  Respiratory: Negative for cough, sputum production, shortness of breath and wheezing.   Cardiovascular: Negative.  Negative for chest pain and palpitations.  Gastrointestinal: Negative for abdominal pain, constipation, diarrhea, heartburn, nausea and vomiting.  Skin: Negative.  Negative for itching and rash.  Neurological: Negative for dizziness and headaches.  Endo/Heme/Allergies: Negative for environmental allergies. Does not bruise/bleed easily.  Objective:   Blood pressure (!) 146/98, pulse 92, temperature 98.2 F (36.8 C), resp. rate 16, SpO2 97 %. There is no height or weight on file to calculate BMI.   Physical Exam:  Physical Exam  Constitutional: He appears well-developed.  Cooperative with the exam.  HENT:  Head: Normocephalic and atraumatic.  Right Ear: Tympanic membrane, external ear and ear canal normal.  Left Ear: Tympanic membrane, external ear and ear canal normal.  Nose: Mucosal edema and rhinorrhea present. No nasal deformity or septal deviation. No epistaxis. Right sinus exhibits no maxillary sinus tenderness and no frontal sinus tenderness. Left sinus exhibits no maxillary sinus tenderness and no frontal sinus tenderness.  Mouth/Throat: Uvula is midline and oropharynx is clear and moist. Mucous membranes are not pale and not dry.  No polyps appreciated.  Eyes: Pupils are equal, round, and reactive to light. Conjunctivae and EOM are normal. Right eye exhibits no chemosis and no discharge. Left eye exhibits no chemosis and no discharge. Right  conjunctiva is not injected. Left conjunctiva is not injected.  Cardiovascular: Normal rate, regular rhythm and normal heart sounds.  Respiratory: Effort normal and breath sounds normal. No accessory muscle usage. No tachypnea. No respiratory distress. He has no wheezes. He has no rhonchi. He has no rales. He exhibits no tenderness.  Moving air well in all lung fields. There are some rhonchi appreciated, but overall he sounds normal.   Lymphadenopathy:    He has no cervical adenopathy.  Neurological: He is alert.  Skin: No abrasion, no petechiae and no rash noted. Rash is not papular, not vesicular and not urticarial. No erythema. No pallor.  No eczematous or urticarial lesions noted.   Psychiatric: He has a normal mood and affect.     Diagnostic studies:    Spirometry: results abnormal (FEV1: 2.24/66%, FVC: 2.72/65%, FEV1/FVC: 82%).    Spirometry consistent with possible restrictive disease. 4 puffs Xopenex via MDI treatment given in clinic with significant improvement in FEV1 and FVC per ATS criteria. However, his technique was better the second time as well.   Allergy Studies: none        Malachi Bonds, MD  [facility]

## 2019-08-25 NOTE — Telephone Encounter (Signed)
Returned patients call, no answer LMTCB 

## 2019-08-25 NOTE — Telephone Encounter (Signed)
Patient is returning call. CB is [phone].

## 2019-08-25 NOTE — Telephone Encounter (Signed)
Spoke with patient who verbally understood labs normal.

## 2019-08-25 NOTE — Patient Instructions (Addendum)
Asthma:  . Lung testing looked slightly lower today with levels in the 60-70% range.  . It did improve with the Xopenex puffs.  . We are going to get some labs to look for autoimmune causes of chest pain such as rheumatoid arthritis, lupus, or sarcoidosis.  . We will call you in 1-2 weeks with the results of the testing.  . We are going to refer you to see a team at [facility] so they can look at you from a different perspective.  Jordan [name] is working on that referral now, so hopefully they reach out to you in the next week to schedule you.  . Daily controller medication(s): continue Breztri 2 puffs twice a day with spacer and rinse mouth afterwards. o Continue Singulair 10mg  daily at night.  . Prior to physical activity: May use albuterol rescue inhaler 2 puffs 5 to 15 minutes prior to strenuous physical activities. Rescue medications: May use albuterol rescue inhaler 2 puffs or nebulizer every 4 to 6 hours as needed for shortness of breath, chest tightness, coughing, and wheezing. Monitor frequency of use. . We have obtained additional labwork today to assess for eosinophilia as well as other conditions that can worsen asthma.  At this time pending labs would recommend adding in a biologic asthma medication like Nucala to see if we can improve your symptoms.  Provided with brochure information for the biologic agent options.  Once labs return we can provide a sample dose in office to get started likely next week.  . Asthma control goals:  o Full participation in all desired activities (may need albuterol before activity) o Albuterol use two times or less a week on average (not counting use with activity) o Cough interfering with sleep two times or less a month o Oral steroids no more than once a year o No hospitalization  Heartburn  Continue pantoprazole 20mg  in the morning. Nothing to eat or drink for 30 minutes.  See below for heartburn lifestyle modification diet.  Seasonal and  perennial allergic rhinoconjunctivitis Past history - 2021 skin testing was positive to grass, tree, mold, dust mites.  Continue with nasal Atrovent 2 sprays each nostril twice a day at this time to help with nasal congestion and drainage  Continue daily use of Xyzal (levocetirizine).  If you are having acute worsening of symptoms, please go to ER/urgent care.  Follow up in 4-6 weeks or earlier if needed with Dr. .

## 2019-08-26 LAB — C-REACTIVE PROTEIN: CRP: <1 mg/L (ref 0–10)

## 2019-08-26 LAB — RHEUMATOID FACTOR: Rheumatoid fact SerPl-aCnc: 13.2 IU/mL (ref 0.0–13.9)

## 2019-08-26 LAB — ANGIOTENSIN CONVERTING ENZYME: Angio Convert Enzyme: 30 U/L (ref 14–82)

## 2019-08-26 LAB — ANA W/REFLEX IF POSITIVE: Anti Nuclear Antibody (ANA): NEGATIVE

## 2019-08-26 LAB — SEDIMENTATION RATE: Sed Rate: 32 mm/hr — ABNORMAL HIGH (ref 0–15)

## 2019-08-27 ENCOUNTER — Telehealth: Payer: Self-pay

## 2019-08-27 ENCOUNTER — Encounter: Payer: Self-pay | Admitting: Allergy & Immunology

## 2019-08-27 NOTE — Telephone Encounter (Signed)
Patients referral for [facility] has been faxed to Dr Su Monks office.  Thanks

## 2019-08-27 NOTE — Telephone Encounter (Signed)
Can we do a referral to Allergy there as well - Dr. Elijah Birk??   Malachi Bonds, MD [facility]

## 2019-08-27 NOTE — Progress Notes (Signed)
Oh that is good.

## 2019-08-28 ENCOUNTER — Telehealth: Payer: Self-pay | Admitting: Allergy & Immunology

## 2019-08-28 LAB — ANA: ANA Titer 1: NEGATIVE

## 2019-08-28 MED ORDER — AZITHROMYCIN 250 MG PO TABS: ORAL_TABLET | ORAL | 0 refills | Status: DC

## 2019-08-28 NOTE — Telephone Encounter (Signed)
[name] called twice today while I was on call.  The first time he went to discuss his lab results.  We had obtained inflammatory markers as well as an ANA and an ACE level when we saw him earlier this week.  The ESR was very mildly elevated.  I explained to him that this is a nonspecific marker of inflammation, but with the CRP and the ANA being normal I was not concerned with that.  I assured him that we would check it again in the future to make sure that it normalized.  He called again later this evening to request an antibiotic for sinus infection.  He tells me that he has had sinus pressure postnasal drip and pain for the past few days since we saw him.  He has had no fever.  He denies a loss of sense of smell or taste.  I confirm the pharmacy that he wanted his antibiotic sent to.  Salvatore Marvel, MD [facility] [place] [facility] [place]

## 2019-08-28 NOTE — Telephone Encounter (Signed)
The patient called a third time to let me know that the pharmacy did not receive the prescription. I confirmed that I sent it electronically. I called the pharmacy directly and talked directly to a pharmacist. She confirmed that they had received the antibiotic script and they were calling him.  Malachi Bonds, MD [facility]

## 2019-08-31 NOTE — Telephone Encounter (Signed)
Referral has been placed to Dr Elijah Birk for review and scheduling.  Thanks

## 2019-08-31 NOTE — Telephone Encounter (Signed)
Awesome. Thank you, [name]!   Malachi Bonds, MD [facility]

## 2019-08-31 NOTE — Telephone Encounter (Signed)
I spoke with patient. He started the antibiotic on 08-28-2019 in the evening. He is still complaining of the same symptoms tat he reported a few days ago. He told me that he is scheduled to see his PCP 09-15-19. I let him know that he should call them and see about getting the appointment scheduled for a sooner date. I educated on staying hydrated and making sure he was taking all medications as prescribed. He acknowledged understanding of this.

## 2019-08-31 NOTE — Telephone Encounter (Signed)
Dr. Dellis Anes would like to know how the patient is doing. I called him and did not receive an answer. I was not able to leave a message.

## 2019-09-01 ENCOUNTER — Telehealth: Payer: Self-pay | Admitting: Allergy & Immunology

## 2019-09-01 MED ORDER — BREZTRI AEROSPHERE 160-9-4.8 MCG/ACT IN AERO: 2.0000 | INHALATION_SPRAY | Freq: Two times a day (BID) | RESPIRATORY_TRACT | 5 refills | Status: DC

## 2019-09-01 MED ORDER — ALBUTEROL SULFATE HFA 108 (90 BASE) MCG/ACT IN AERS: 1.0000 | INHALATION_SPRAY | Freq: Four times a day (QID) | RESPIRATORY_TRACT | 1 refills | Status: DC | PRN

## 2019-09-01 NOTE — Telephone Encounter (Signed)
Patient called my on-call number despite the fact that I am not on-call.  From reading his dictation, it seems that he needs a refill on his albuterol  Can someone kindly call him back and remind him to call the main number for these kind of requests?   Malachi Bonds, MD [facility]

## 2019-09-01 NOTE — Telephone Encounter (Signed)
Called and spoke with patient and he stated that he needs his Albuterol and Breztri refilled. Advised patient that his medications have been refilled for him and to please contact [phone] for future need for refills and any other needs. Patient verbalized understanding.

## 2019-09-01 NOTE — Addendum Note (Signed)
Addended by: Dollene Cleveland R on: 09/01/2019 01:25 PM   Modules accepted: Orders

## 2019-09-02 ENCOUNTER — Telehealth: Payer: Self-pay | Admitting: Allergy

## 2019-09-02 ENCOUNTER — Ambulatory Visit (INDEPENDENT_AMBULATORY_CARE_PROVIDER_SITE_OTHER): Payer: BC Managed Care – PPO | Admitting: Family Medicine

## 2019-09-02 ENCOUNTER — Other Ambulatory Visit: Payer: Self-pay

## 2019-09-02 ENCOUNTER — Encounter: Payer: Self-pay | Admitting: Family Medicine

## 2019-09-02 VITALS — Ht 69.0 in | Wt 173.0 lb

## 2019-09-02 DIAGNOSIS — R0789 Other chest pain: Secondary | ICD-10-CM

## 2019-09-02 NOTE — Telephone Encounter (Signed)
I called Mr. Crampton back to discuss his concerns. He has not gotten an appointment with [facility] to get an appointment. He never received a call from them. He talked to his PCP earlier today. His PCP told him that he needed to see.   Mr. Brum called here because his PCP recommended that he call us regarding guidance.   He was under the impression that he could still see [place] for other issues. This is what [place] had told him. Wynona Canes apparently gave him the impression that we were completely transferring his care.  He feels better about this whole thing now. It seems that he is more frustrated with Dr. Doreene Burke overall. He spends a lot of time during this conversation talking about his interactions with Dr. Doreene Burke.   I will check with our Referral Coordinator to see where the referral is from her end.   Malachi Bonds, MD [facility] [place]

## 2019-09-02 NOTE — Telephone Encounter (Signed)
Spoke with patient was wanting a referral to [facility] Dr. Elijah Birk [facility]. Patient was having issues communicating information with his PCP and had difficult time getting an understanding with his PCP of his medical condition. Patient is upset because he feels like his care is not being taken seriously. We done all we could do and isn't understanding why he is being tossed around as he stated and now going to see a third doctor regarding his situation. He is wanting to speak with someone in management or the doctors managing his care?

## 2019-09-02 NOTE — Progress Notes (Signed)
Established Patient Office Visit  Subjective:  Patient ID: Jordan [name], male    DOB: 08/30/1975  Age: 44 y.o. MRN: [id number]  CC:  Chief Complaint  Patient presents with  . Headache    c/o headache and chest sometimes feels tight x 1 month     HPI Jordan [name] presents for ongoing tightness in his chest.  It is difficult to understand what he is telling me over the phone but he denies chest pain associated with exertion nausea or diaphoresis.  He feels as though there may be a tightness in his chest wall.  He recently started Nucala.   Past Medical History:  Diagnosis Date  . Asthma   . Reflux esophagitis     Past Surgical History:  Procedure Laterality Date  . NO PAST SURGERIES      Family History  Problem Relation Age of Onset  . Healthy Mother   . Healthy Father     Social History   Socioeconomic History  . Marital status: Married    Spouse name: Not on file  . Number of children: Not on file  . Years of education: Not on file  . Highest education level: Not on file  Occupational History  . Not on file  Tobacco Use  . Smoking status: Never Smoker  . Smokeless tobacco: Never Used  Substance and Sexual Activity  . Alcohol use: Yes    Comment: occ  . Drug use: No  . Sexual activity: Yes  Other Topics Concern  . Not on file  Social History Narrative  . Not on file   Social Determinants of Health   Financial Resource Strain:   . Difficulty of Paying Living Expenses:   Food Insecurity:   . Worried About Programme researcher, broadcasting/film/video in the Last Year:   . Barista in the Last Year:   Transportation Needs:   . Freight forwarder (Medical):   Marland Kitchen Lack of Transportation (Non-Medical):   Physical Activity:   . Days of Exercise per Week:   . Minutes of Exercise per Session:   Stress:   . Feeling of Stress :   Social Connections:   . Frequency of Communication with Friends and Family:   . Frequency of Social Gatherings with Friends and Family:   .  Attends Religious Services:   . Active Member of Clubs or Organizations:   . Attends Banker Meetings:   Marland Kitchen Marital Status:   Intimate Partner Violence:   . Fear of Current or Ex-Partner:   . Emotionally Abused:   Marland Kitchen Physically Abused:   . Sexually Abused:     Outpatient Medications Prior to Visit  Medication Sig Dispense Refill  . albuterol (PROAIR HFA) 108 (90 Base) MCG/ACT inhaler Inhale 1-2 puffs into the lungs every 6 (six) hours as needed for wheezing or shortness of breath. 18 g 1  . amLODipine (NORVASC) 5 MG tablet Take 1 tablet (5 mg total) by mouth daily. 90 tablet 0  . Budeson-Glycopyrrol-Formoterol (BREZTRI AEROSPHERE) 160-9-4.8 MCG/ACT AERO Inhale 2 puffs into the lungs in the morning and at bedtime. 10.7 g 5  . ibuprofen (ADVIL) 800 MG tablet Take 1 tablet (800 mg total) by mouth 3 (three) times daily. 21 tablet 0  . ipratropium-albuterol (DUONEB) 0.5-2.5 (3) MG/3ML SOLN Take 3 mLs by nebulization every 6 (six) hours as needed (wheezing, shortness of breath, chest tightness). 360 mL 0  . montelukast (SINGULAIR) 10 MG tablet Take 1 tablet (10 mg  total) by mouth at bedtime. 90 tablet 1  . pantoprazole (PROTONIX) 20 MG tablet Take 1 tablet (20 mg total) by mouth daily. 30 tablet 1  . Spacer/Aero-Hold Chamber Mask MISC 1 each by Other route 3 times/day as needed-between meals & bedtime. 1 each 0  . azithromycin (ZITHROMAX) 250 MG tablet Take 2 tablets on the first day and then 1 tablet daily for 4 more days. (Patient not taking: Reported on 09/02/2019) 6 each 0   No facility-administered medications prior to visit.    No Known Allergies  ROS Review of Systems  Constitutional: Negative for fatigue, fever and unexpected weight change.  Respiratory: Positive for cough and chest tightness. Negative for shortness of breath and wheezing.   Cardiovascular: Negative for chest pain and palpitations.  Gastrointestinal: Negative for nausea and vomiting.  Genitourinary:  Negative.   Skin: Negative for pallor and rash.      Objective:    Physical Exam  Constitutional: No distress.  Pulmonary/Chest: Effort normal.  Neurological: He is alert.  Psychiatric: He has a normal mood and affect. His behavior is normal.    Ht 5\' 9"  (1.753 m)   Wt 173 lb (78.5 kg)   BMI 25.55 kg/m  Wt Readings from Last 3 Encounters:  09/02/19 173 lb (78.5 kg)  07/30/19 178 lb 3.2 oz (80.8 kg)  07/29/19 175 lb (79.4 kg)     Health Maintenance Due  Topic Date Due  . TETANUS/TDAP  Never done    There are no preventive care reminders to display for this patient.  Lab Results  Component Value Date   TSH 1.97 07/22/2019   Lab Results  Component Value Date   WBC 4.2 08/07/2019   HGB 13.7 08/07/2019   HCT 41.4 08/07/2019   MCV 90 08/07/2019   PLT 256 08/07/2019   Lab Results  Component Value Date   NA 135 08/03/2019   K 4.1 08/03/2019   CO2 29 08/03/2019   GLUCOSE 92 08/03/2019   BUN 15 08/03/2019   CREATININE 1.03 08/03/2019   BILITOT 0.5 08/03/2019   ALKPHOS 71 08/03/2019   AST 34 08/03/2019   ALT 73 (H) 08/03/2019   PROT 7.6 08/03/2019   ALBUMIN 4.3 08/03/2019   CALCIUM 9.6 08/03/2019   ANIONGAP 9 08/31/2016   GFR 95.22 08/03/2019   Lab Results  Component Value Date   CHOL 313 (H) 07/22/2019   Lab Results  Component Value Date   HDL 132.80 07/22/2019   Lab Results  Component Value Date   LDLCALC 147 (H) 07/22/2019   Lab Results  Component Value Date   TRIG 163.0 (H) 07/22/2019   Lab Results  Component Value Date   CHOLHDL 2 07/22/2019   No results found for: HGBA1C    Assessment & Plan:   Problem List Items Addressed This Visit    None    Visit Diagnoses    Chest tightness    -  Primary      No orders of the defined types were placed in this encounter.   Follow-up: No follow-ups on file.   Advised him that it was difficult to assess his chest tightness over the phone and I told him that I thought he should go to the  emergency room.  He will continue seeing the allergist.  He has follow-up scheduled with 07/24/2019 on the 20th.  Virtual Visit via Video Note  I connected with 21 on 09/02/19 at 10:30 AM EDT by a video enabled telemedicine application  and verified that I am speaking with the correct person using two identifiers.  Location: Patient: home  Provider:    I discussed the limitations of evaluation and management by telemedicine and the availability of in person appointments. The patient expressed understanding and agreed to proceed.  History of Present Illness:    Observations/Objective:   Assessment and Plan:   Follow Up Instructions:    I discussed the assessment and treatment plan with the patient. The patient was provided an opportunity to ask questions and all were answered. The patient agreed with the plan and demonstrated an understanding of the instructions.   The patient was advised to call back or seek an in-person evaluation if the symptoms worsen or if the condition fails to improve as anticipated.  I provided 20 minutes of non-face-to-face time during this encounter.   Libby Maw, MD   Libby Maw, MD   Interactive video and audio telecommunications were attempted between myself and the patient. However they failed due to the patient having technical difficulties or not having access to video capability. We continued and completed with audio only.

## 2019-09-02 NOTE — Telephone Encounter (Signed)
Patient called and states he was rushed to see his Primary Care and his Primary Care does not know what to do. Patient would like a doctor or nurse to call him back.  Please advise.

## 2019-09-03 NOTE — Telephone Encounter (Signed)
Thank you for the update.  I will forward to his primary allergist, Dr. Delorse Lek.  Malachi Bonds, MD [facility] [place]

## 2019-09-03 NOTE — Telephone Encounter (Signed)
I have the patient scheduled for May 13 @ 10:00 with Dr Elijah Birk @ [facility]  [address] [place], [place] [address] [phone]  The patient is to stay off all antihistamines 7 days before his appointment. If the patient needs to reschedule he can give their office a call at the number above.   I tried to contact the patient but his voicemail is currently full. I will give him a call again today if I do not hear back from him.

## 2019-09-03 NOTE — Telephone Encounter (Signed)
Patient has been informed.    Thanks.

## 2019-09-04 ENCOUNTER — Telehealth: Payer: Self-pay | Admitting: *Deleted

## 2019-09-04 NOTE — Telephone Encounter (Signed)
I made a copy of the forms for [place] to scan into the patients chart. I also faxed forms and called [place] to let him know that It has been complete. Jedd asked me to leave the forms up front for him to pick up this afternoon. Left with Patsy up front.

## 2019-09-04 NOTE — Telephone Encounter (Signed)
-----   Message from [facility] Larose Hires, MD sent at 09/03/2019  7:23 PM EDT ----- Regarding: pt forms Please let pt know that I filled out the forms the best of my abilities and he can pick them up or we can fax them where they need to go.  Paperwork is in my office.

## 2019-09-10 ENCOUNTER — Telehealth: Payer: Self-pay | Admitting: Allergy & Immunology

## 2019-09-10 MED ORDER — MONTELUKAST SODIUM 10 MG PO TABS: 10.0000 mg | ORAL_TABLET | Freq: Every day | ORAL | 1 refills | Status: DC

## 2019-09-10 NOTE — Telephone Encounter (Signed)
Refill has been sent to requested pharmacy. Called patient and informed, patient verbalized understanding.

## 2019-09-10 NOTE — Telephone Encounter (Signed)
Patient called and needs to have singulair called into [facility] on [address]. [phone]

## 2019-09-11 ENCOUNTER — Telehealth: Payer: Self-pay | Admitting: *Deleted

## 2019-09-11 MED ORDER — AZELASTINE HCL 0.1 % NA SOLN: NASAL | 5 refills | Status: DC

## 2019-09-11 NOTE — Telephone Encounter (Signed)
Called and advised to patient. Astelin has been sent to pharmacy. Patient verbalized understanding.

## 2019-09-11 NOTE — Telephone Encounter (Signed)
Patient called and states that he feels like he is having a lot of drainage and states that he feel like he may need something to change his regimen. He mentioned changed an inhaler but I asked if he was using his nasal Atrovent and he stated that he was after he used his Breztri. He states he is also taking his Xyzal daily,he states it feels as if he is not on any medication at all. Please advise a possible change in medication.

## 2019-09-11 NOTE — Telephone Encounter (Signed)
Oh no.  He would definitely benefit from allergy shots however his breathing needs to be under good control and at this time it is not (which is the need for further referral to academic institution for additional eyes and minds helping to improve his symptoms).   In regards to the [facility] asthma inhaler there is not really anything higher than this as this is a triple therapy inhaler.  This was the reason we have tried the Nucala and referral.  In regards to his allergies, if Xyzal does not make him sleepy he can try taking additional doses like in AM and PM or 2 at same time.   He is already on Singulair.   If additional Xyzal is not helpful then we can try to 1st generation antihistamines like Ryvent, Karbinal or Ryclora.   Would see if extra Xyzal helps and if not we can always provide with samples when he comes in for his next Nucala.   Is he performing nasal saline rinses?  If not would recommend this as well.   For the nasal drainage, he can use the nasal Atrovent 4 times a day if needed.  It looks like we haven't tried nasal Astelin with him.  If not let's prescribe this as well 2 sprays each nostril twice a day for nasal drainage.

## 2019-09-11 NOTE — Addendum Note (Signed)
Addended by: Dollene Cleveland R on: 09/11/2019 04:34 PM   Modules accepted: Orders

## 2019-09-14 ENCOUNTER — Other Ambulatory Visit: Payer: Self-pay

## 2019-09-14 NOTE — Telephone Encounter (Signed)
Call to patient.  Pt wanted [place] to know that he was changing his PCP to a Dr [name], he was asking if he should go.  "He knows that the Dr is going to refer back to [place]".  I explained to patient that he should go and establish care with his new doctor, keep his appointment with Dr Elijah Birk at [facility].   Pt also is using more albuterol than usual even after using the [facility], explained that he should only use that twice daily and no more than that.  Also, explained to pt to keep track of how often he is using his albuterol.   Re-explained all of his medications, what they are used for and he should continue on his regimen until pulmonary or Dr Delorse Lek changes it.   Pt does not have any further needs, he verbalized understanding, call ended.

## 2019-09-14 NOTE — Telephone Encounter (Signed)
Patient called and would like a nurse or doctor to call him back because he has some questions. Patient states he has an appointment with his primary care tomorrow, but wants to know if he should just wait until his [facility] appointment in May. Patient states the last two times he has seen his primary care they referred him back to our office so he does not know what to do.   Please advise.

## 2019-09-15 ENCOUNTER — Ambulatory Visit (INDEPENDENT_AMBULATORY_CARE_PROVIDER_SITE_OTHER): Payer: BC Managed Care – PPO | Admitting: Nurse Practitioner

## 2019-09-15 ENCOUNTER — Encounter: Payer: Self-pay | Admitting: Nurse Practitioner

## 2019-09-15 VITALS — BP 132/82 | HR 89 | Temp 97.1°F | Ht 69.0 in | Wt 178.4 lb

## 2019-09-15 DIAGNOSIS — R0789 Other chest pain: Secondary | ICD-10-CM

## 2019-09-15 DIAGNOSIS — J4540 Moderate persistent asthma, uncomplicated: Secondary | ICD-10-CM | POA: Diagnosis not present

## 2019-09-15 MED ORDER — NAPROXEN 500 MG PO TABS: 500.0000 mg | ORAL_TABLET | Freq: Two times a day (BID) | ORAL | 2 refills | Status: DC

## 2019-09-15 MED ORDER — PANTOPRAZOLE SODIUM 20 MG PO TBEC: 20.0000 mg | DELAYED_RELEASE_TABLET | Freq: Two times a day (BID) | ORAL | 2 refills | Status: DC

## 2019-09-15 NOTE — Patient Instructions (Addendum)
I recommend for you to maintain your appt pulmonologist.  Increase pantoprazole to 20mg  BID before meals and start naproxen BID after meals.  No additional diagnostic at this time.  Discuss continuation of Nucala injection with allergist  We will consider referral to GI after eval with pulmonologist.

## 2019-09-15 NOTE — Progress Notes (Signed)
Subjective:  Patient ID: Jordan [name], male    DOB: [birth year]  Age: 44 y.o. MRN: [id number]  CC: Follow-up (TOC from [facility] dr. wanted him to establish//pt isn't fasting//no tetanus)  HPI Jordan [name] is here to transfer care from Dr. Ethelene Hal. He will like to discuss ongoing chest wall tightness since Nucala injection on 08/21/2019. This was administered at Allergy and Asthma office. Associated with hoarseness. Symptoms are stable. No improvement with improvement '800mg'$  TID and albuterol IHN prn. Not associated with weight loss or fever or fatigue or cough or diaphoresis or syncope or dizziness or nausea or hematochezia or melena. He has an appt with pulmonologist 09/2019 for additional evaluation of symptoms. He also had CXR and ABD [place] completed 19monthago: both normal. He also had extensive lab done by allergist: ANA, RH factor, cbc, ESR, CMP (unremarkable)  Reviewed past Medical, Social and Family history today.  Outpatient Medications Prior to Visit  Medication Sig Dispense Refill  . albuterol (PROAIR HFA) 108 (90 Base) MCG/ACT inhaler Inhale 1-2 puffs into the lungs every 6 (six) hours as needed for wheezing or shortness of breath. 18 g 1  . amLODipine (NORVASC) 5 MG tablet Take 1 tablet (5 mg total) by mouth daily. 90 tablet 0  . azelastine (ASTELIN) 0.1 % nasal spray 2 sprays per nostril 2 times daily as needed for drainage. 30 mL 5  . Budeson-Glycopyrrol-Formoterol (BREZTRI AEROSPHERE) 160-9-4.8 MCG/ACT AERO Inhale 2 puffs into the lungs in the morning and at bedtime. 10.7 g 5  . ibuprofen (ADVIL) 800 MG tablet Take 1 tablet (800 mg total) by mouth 3 (three) times daily. 21 tablet 0  . ipratropium-albuterol (DUONEB) 0.5-2.5 (3) MG/3ML SOLN Take 3 mLs by nebulization every 6 (six) hours as needed (wheezing, shortness of breath, chest tightness). 360 mL 0  . montelukast (SINGULAIR) 10 MG tablet Take 1 tablet (10 mg total) by mouth at bedtime. 90 tablet 1  . Spacer/Aero-Hold Chamber  Mask MISC 1 each by Other route 3 times/day as needed-between meals & bedtime. 1 each 0  . pantoprazole (PROTONIX) 20 MG tablet Take 1 tablet (20 mg total) by mouth daily. 30 tablet 1  . azithromycin (ZITHROMAX) 250 MG tablet Take 2 tablets on the first day and then 1 tablet daily for 4 more days. (Patient not taking: Reported on 09/02/2019) 6 each 0  . cetirizine (ZYRTEC ALLERGY) 10 MG tablet Take 1 tablet (10 mg total) by mouth daily. (Patient not taking: Reported on 06/10/2019) 90 tablet 0   No facility-administered medications prior to visit.    ROS See HPI  Objective:  BP 132/82   Pulse 89   Temp (!) 97.1 F (36.2 C) (Tympanic)   Ht '5\' 9"'$  (1.753 m)   Wt 178 lb 6.4 oz (80.9 kg)   SpO2 98%   BMI 26.35 kg/m   BP Readings from Last 3 Encounters:  09/15/19 132/82  08/25/19 (!) 146/98  08/07/19 128/80    Wt Readings from Last 3 Encounters:  09/15/19 178 lb 6.4 oz (80.9 kg)  09/02/19 173 lb (78.5 kg)  07/30/19 178 lb 3.2 oz (80.8 kg)    Physical Exam Vitals reviewed.  Constitutional:      General: He is not in acute distress.    Appearance: He is not ill-appearing or diaphoretic.  Cardiovascular:     Rate and Rhythm: Normal rate and regular rhythm.     Pulses: Normal pulses.     Heart sounds: Normal heart sounds.  Pulmonary:  Effort: Pulmonary effort is normal.     Breath sounds: Normal breath sounds.  Chest:     Chest wall: Tenderness present.  Abdominal:     General: Bowel sounds are normal. There is no distension.     Tenderness: There is abdominal tenderness. There is no right CVA tenderness, left CVA tenderness or guarding.     Comments: Epigastric tenderness  Musculoskeletal:     Cervical back: Normal range of motion and neck supple.  Lymphadenopathy:     Cervical: No cervical adenopathy.  Skin:    Findings: No rash.  Neurological:     Mental Status: He is alert and oriented to person, place, and time.    Lab Results  Component Value Date   WBC 4.2  08/07/2019   HGB 13.7 08/07/2019   HCT 41.4 08/07/2019   PLT 256 08/07/2019   GLUCOSE 92 08/03/2019   CHOL 313 (H) 07/22/2019   TRIG 163.0 (H) 07/22/2019   HDL 132.80 07/22/2019   LDLCALC 147 (H) 07/22/2019   ALT 73 (H) 08/03/2019   AST 34 08/03/2019   NA 135 08/03/2019   K 4.1 08/03/2019   CL 97 08/03/2019   CREATININE 1.03 08/03/2019   BUN 15 08/03/2019   CO2 29 08/03/2019   TSH 1.97 07/22/2019    US Abdomen Complete  Result Date: 08/21/2019 CLINICAL DATA:  Elevated liver function tests. EXAM: ABDOMEN ULTRASOUND COMPLETE COMPARISON:  CT scan of the abdomen dated 04/21/2017 FINDINGS: Gallbladder: No gallstones or wall thickening visualized. No sonographic Murphy sign noted by sonographer. Common bile duct: Diameter: 2.3 mm Liver: No focal lesion identified. Within normal limits in parenchymal echogenicity. Portal vein is patent on color Doppler imaging with normal direction of blood flow towards the liver. IVC: No abnormality visualized. Pancreas: Visualized portion unremarkable. Spleen: Size and appearance within normal limits. Right Kidney: Length: 10.0 cm. Echogenicity within normal limits. No mass or hydronephrosis visualized. Left Kidney: Length: 10.7 cm. Echogenicity within normal limits. No mass or hydronephrosis visualized. Abdominal aorta: No aneurysm visualized. Other findings: None. IMPRESSION: Normal exam. Electronically Signed   By: Lorriane Shire M.D.   On: 08/21/2019 14:08    Assessment & Plan:  This visit occurred during the SARS-CoV-2 public health emergency.  Safety protocols were in place, including screening questions prior to the visit, additional usage of staff PPE, and extensive cleaning of exam room while observing appropriate contact time as indicated for disinfecting solutions.   Jordan [name] was seen today for follow-up.  Diagnoses and all orders for this visit:  Chest wall tenderness -     naproxen (NAPROSYN) 500 MG tablet; Take 1 tablet (500 mg total) by  mouth 2 (two) times daily with a meal.  Moderate persistent asthma, unspecified whether complicated -     pantoprazole (PROTONIX) 20 MG tablet; Take 1 tablet (20 mg total) by mouth 2 (two) times daily.  I recommend for you to maintain your appt with pulmonologist. Increase pantoprazole to '20mg'$  BID before meals and start naproxen BID after meals. No additional diagnostic at this time. Discuss continuation of Nucala injection with allergist We will consider referral to GI after eval with pulmonologist.  I have discontinued Juanda Crumble Pizana's cetirizine and azithromycin. I have also changed his pantoprazole. Additionally, I am having him start on naproxen. Lastly, I am having him maintain his amLODipine, Spacer/Aero-Hold Chamber Mask, ipratropium-albuterol, ibuprofen, Breztri Aerosphere, albuterol, montelukast, and azelastine.  Meds ordered this encounter  Medications  . pantoprazole (PROTONIX) 20 MG tablet    Sig:  Take 1 tablet (20 mg total) by mouth 2 (two) times daily.    Dispense:  60 tablet    Refill:  2    Order Specific Question:   Supervising Provider    Answer:   Ronnald Nian [[id number]]  . naproxen (NAPROSYN) 500 MG tablet    Sig: Take 1 tablet (500 mg total) by mouth 2 (two) times daily with a meal.    Dispense:  60 tablet    Refill:  2    Order Specific Question:   Supervising Provider    Answer:   Ronnald Nian [[id number]]   Problem List Items Addressed This Visit      Respiratory   Asthma   Relevant Medications   pantoprazole (PROTONIX) 20 MG tablet    Other Visit Diagnoses    Chest wall tenderness    -  Primary   Relevant Medications   naproxen (NAPROSYN) 500 MG tablet      Follow-up: Return if symptoms worsen or fail to improve.  Wilfred Lacy, NP

## 2019-09-15 NOTE — Telephone Encounter (Signed)
Thanks [place].  We did add a new nasal spray to his allergy regimen after recent call that he was still having a lot of nasal drainage.  His degree of albuterol use is concerning however he is on max inhaler/asthma therapy and has been started on Nucala.  He should be do for his next dose this week.

## 2019-09-16 ENCOUNTER — Encounter: Payer: Self-pay | Admitting: Nurse Practitioner

## 2019-09-18 ENCOUNTER — Telehealth: Payer: Self-pay | Admitting: Nurse Practitioner

## 2019-09-18 ENCOUNTER — Telehealth: Payer: Self-pay | Admitting: Allergy & Immunology

## 2019-09-18 DIAGNOSIS — J4540 Moderate persistent asthma, uncomplicated: Secondary | ICD-10-CM

## 2019-09-18 DIAGNOSIS — I10 Essential (primary) hypertension: Secondary | ICD-10-CM

## 2019-09-18 MED ORDER — PANTOPRAZOLE SODIUM 20 MG PO TBEC: 20.0000 mg | DELAYED_RELEASE_TABLET | Freq: Two times a day (BID) | ORAL | 5 refills | Status: DC

## 2019-09-18 MED ORDER — AMLODIPINE BESYLATE 5 MG PO TABS: 5.0000 mg | ORAL_TABLET | Freq: Every day | ORAL | 0 refills | Status: DC

## 2019-09-18 NOTE — Telephone Encounter (Signed)
Charlotte please advise, I sent in 90 days supply. Pt last ov for BP with Dr. Doreene Burke was 07/14/2019. When do you want him to follow up with you?

## 2019-09-18 NOTE — Telephone Encounter (Signed)
Called and spoke with the patient and advised that I can send in the Pantoprazole but unfortunately he will need to have his PCP refill the Amlodipine. He states that he has been having a hard time with his PCP with getting his meds refilled. Advised to patient to call back and speak with the office manager if he has to in order to get this resolved. Patient verbalized understanding. Pantoprazole has been sent to the pharmacy. Patient is aware.

## 2019-09-18 NOTE — Telephone Encounter (Signed)
Patient is calling and requesting a refill for amlodipine sent to [facility] on [address]. CB is [phone]

## 2019-09-18 NOTE — Telephone Encounter (Signed)
Patient called and needs to have the pantoprazole and the amlodipine to [facility] [address] [phone]

## 2019-09-19 NOTE — Telephone Encounter (Signed)
Yes schedule CPE in 72months

## 2019-09-21 ENCOUNTER — Telehealth: Payer: Self-pay | Admitting: *Deleted

## 2019-09-21 IMAGING — CR DG CHEST 2V
2 series · 2 of 2 positions shown · non-contrast
Comparison: 05/24/2018

CLINICAL DATA: Cough for 1 month.

EXAM:
CHEST - 2 VIEW

[chest pa]
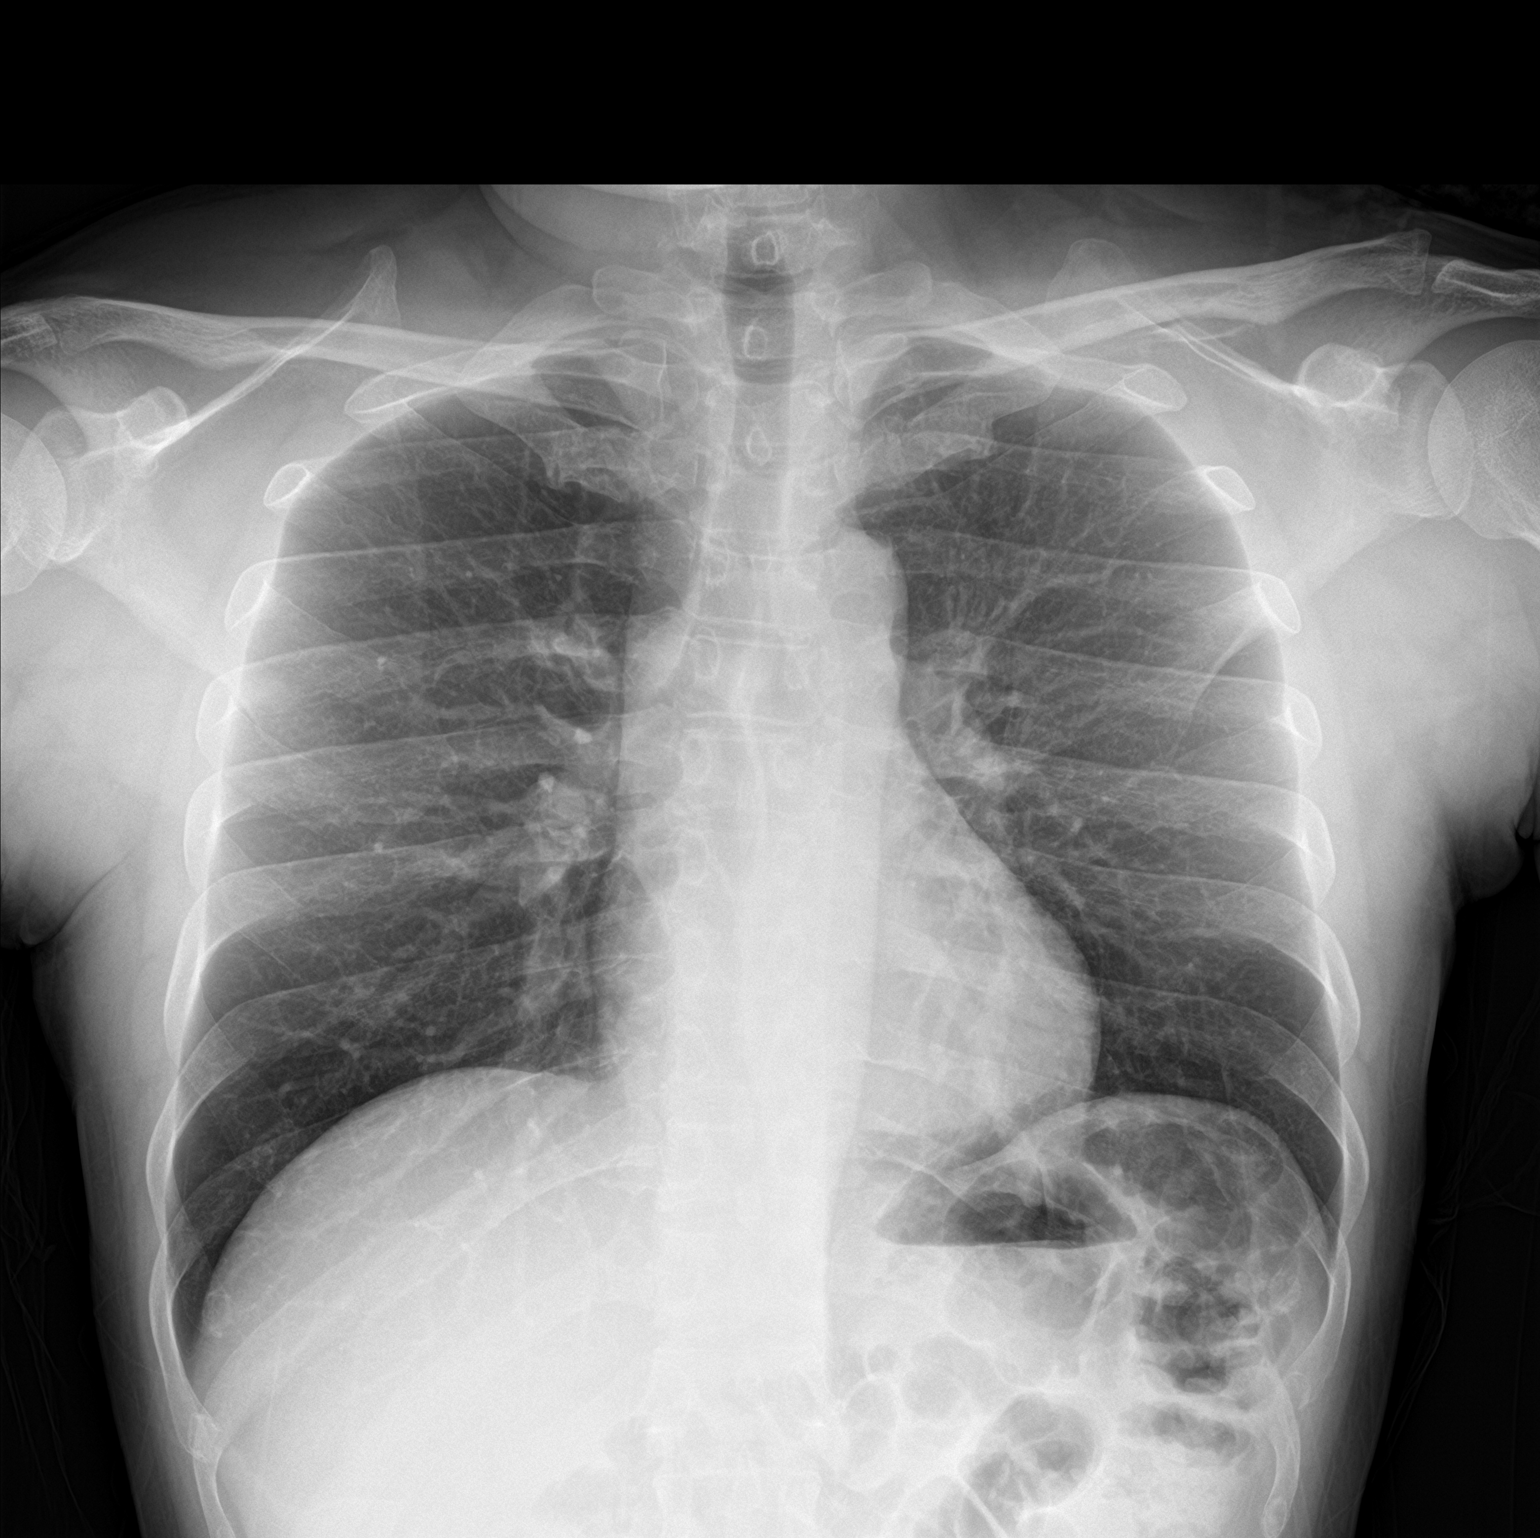

[chest lat]
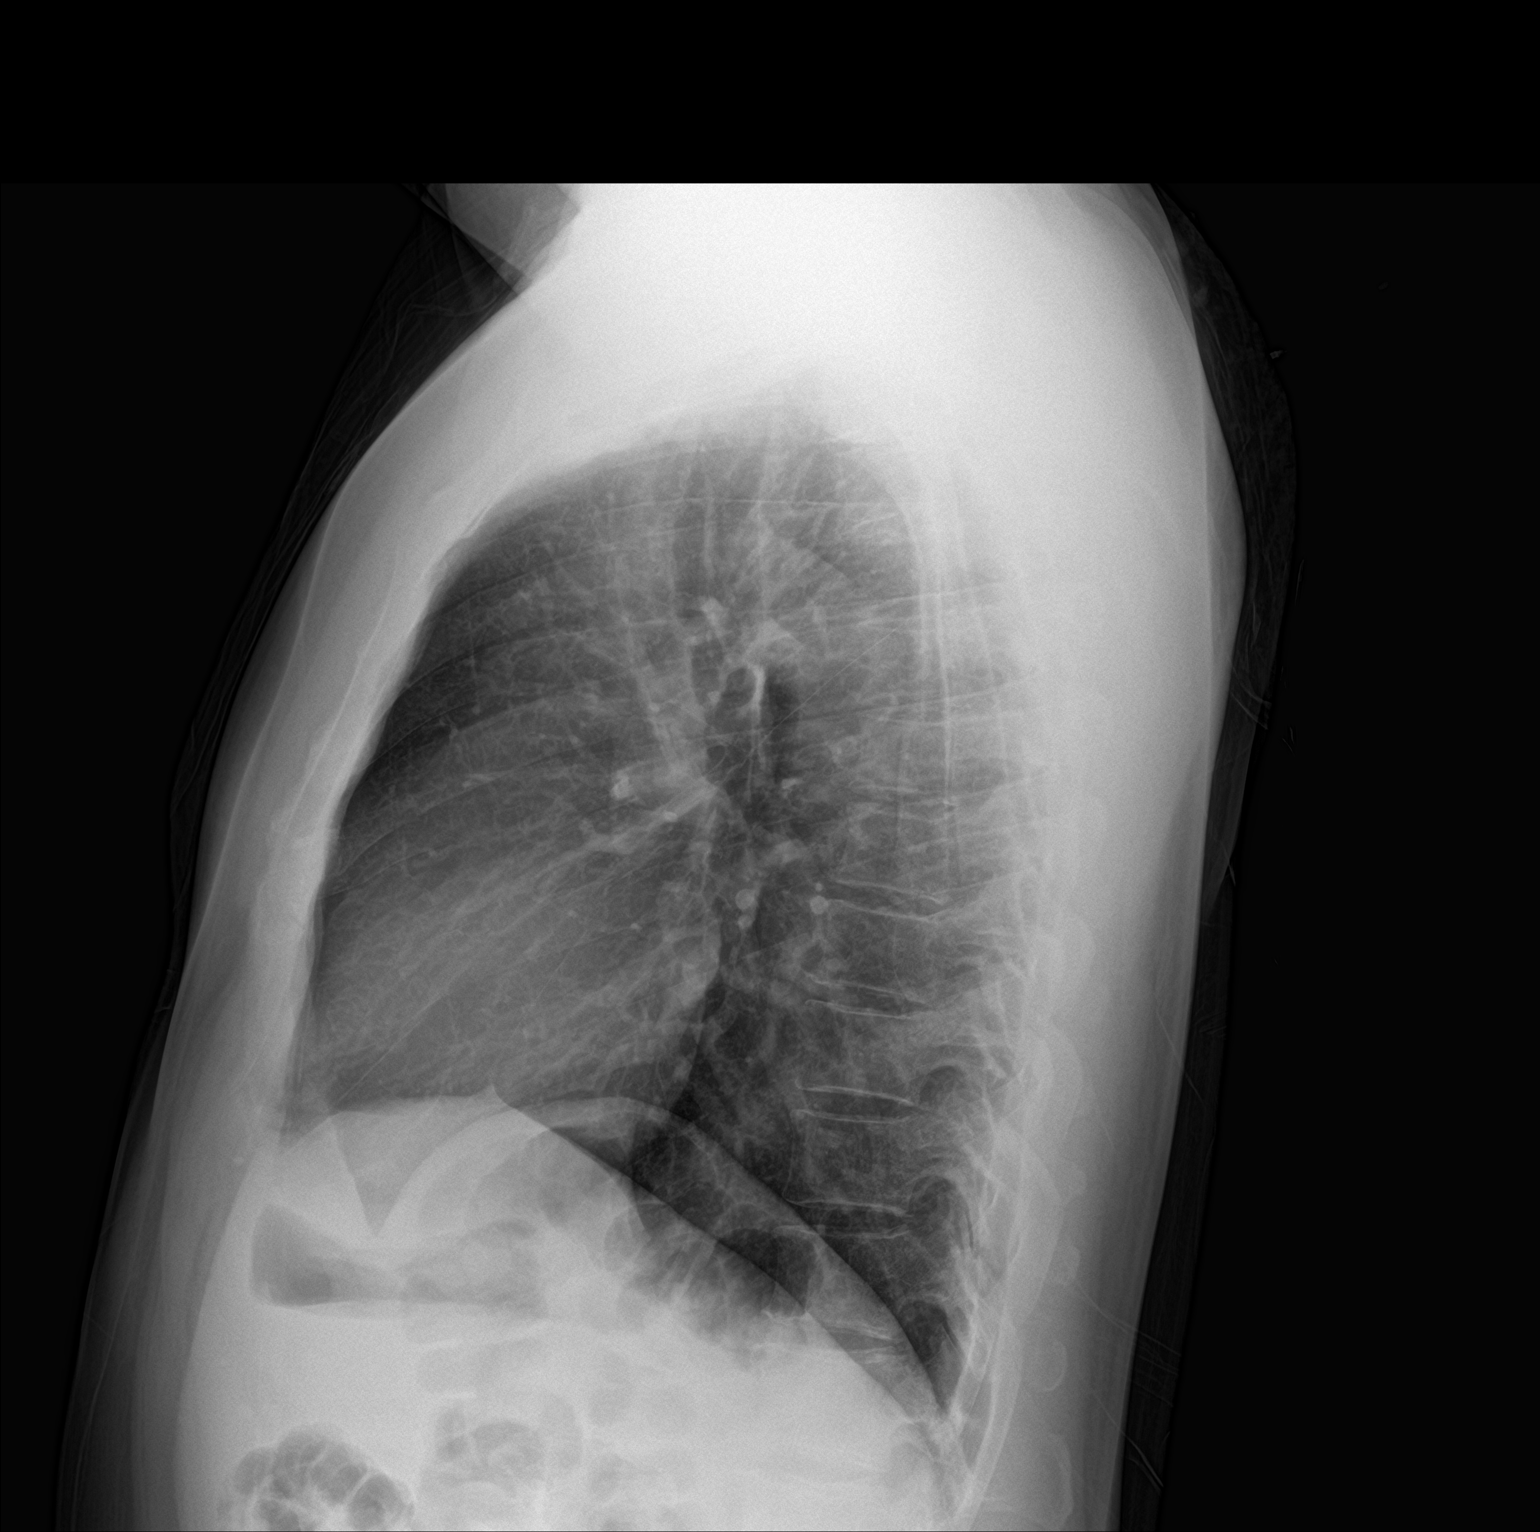

[2 of 2 positions shown; findings below may reference images not displayed]

FINDINGS: The heart size and mediastinal contours are within normal limits.
Both lungs are clear. The visualized skeletal structures are
unremarkable.
IMPRESSION: Negative.  No active cardiopulmonary disease.

## 2019-09-21 NOTE — Telephone Encounter (Signed)
Please help call and offer CPE with [facility] in about 3 mo.

## 2019-09-21 NOTE — Telephone Encounter (Signed)
Called [facility] first and spoke with them to figure out the situation with why he was not able to pick up his Pantoprazole this past weekend, at first the pharmacy tech stated that it was too early for him to pick up his medication since he had it filled at the end of March, but she stated that she would run it again now that it was closer to the end of April. She stated that the issues is that he needs to call the number on the back of his insurance card and Opt out of the mail order delivery and then they would be able to fill his medication. I called the patient and advised what he needed to do to get this taken care of so that he would be able to pick up his medication. Patient verbalized understanding and did state that he attempted to call the phone number he had for on call doctor which happened to be Dr. Ellouise Newer phone number. He stated that when he called and spoke to Dr. Dellis Anes, Dr. Dellis Anes informed him that he needed to stop calling him and ended the phone call. He stated that was the number he knew and he didn't realize he was calling the wrong number. He then stated that he was able to get the right number and spoke with Dr. Lucie Leather since he was the on call physician and unfortunately Dr. Lucie Leather did not know how to help him with this medication issue. Patient stated that to him it was an emergency for him to have this medication and he felt like he did not get the help that he needed. I apologized to the patient for what happened and advised that I would inform my manager and boss of this issue so that it could be rectified. I did advise that normally the physicians do not handle issues with getting medications filled that normally the nurses handled these issues which is why Dr. Lucie Leather most likely was not able to help him. I did state however that we would do what we could to help him get his medication and calling his insurance company to opt out of mail order should take care of the issues.  Patient verbalized understanding. Advised to please call back with any further questions or concerns. Patient verbalized understanding.

## 2019-09-21 NOTE — Telephone Encounter (Signed)
Patient called stating that he spoke to [place] last week. He is under the impression that he would be getting a call back from [place] today. He states that we are supposed to be getting an issue with his medications resolved. Patient states that he went to refill medications and it would not let him. Patient says that we are supposed to override the issue in order for him to get his medications filled. Said that Apolonio Schneiders will know what he is speaking of.   Please advise,

## 2019-09-21 NOTE — Telephone Encounter (Signed)
Made in error

## 2019-09-22 NOTE — Telephone Encounter (Signed)
Tried to call 09/22/19 to schedule, unable to leave message

## 2019-09-23 NOTE — Telephone Encounter (Signed)
Pt scheduled  

## 2019-09-30 ENCOUNTER — Other Ambulatory Visit: Payer: Self-pay | Admitting: *Deleted

## 2019-09-30 ENCOUNTER — Telehealth: Payer: Self-pay | Admitting: Allergy & Immunology

## 2019-09-30 MED ORDER — PREDNISONE 10 MG PO TABS: ORAL_TABLET | ORAL | 0 refills | Status: DC

## 2019-09-30 NOTE — Telephone Encounter (Signed)
Called and left a voicemail asking for patient to call back to discuss.  

## 2019-09-30 NOTE — Telephone Encounter (Signed)
Patient has called back after hours and spoken with Dr. Dellis Anes. Per Dr. Ellouise Newer instruction Prednisone has been sent in 40mg  daily for 5 days to the [facility] on .

## 2019-09-30 NOTE — Telephone Encounter (Signed)
Patient called and would like to talk with some one about his asthma, he has a flare up for the past three days. [facility] [address] [phone].

## 2019-10-01 ENCOUNTER — Telehealth: Payer: Self-pay | Admitting: *Deleted

## 2019-10-01 NOTE — Telephone Encounter (Signed)
Called and spoke with Tammy in regards to the patient's Nucala and she states that it is ready at the pharmacy but the patient needs to speak with [facility] to give approval for shipment. Patient needs to call [facility] at [phone] to release shipment in order to get him scheduled for his next dose. Called and left a voicemail asking for patient to return call to inform.

## 2019-10-01 NOTE — Telephone Encounter (Signed)
Patient called back and was advised about calling [facility] to confirm shipment for his medication. Phone number was given to patient. Advised that once we have confirmed shipment and the medication is sent to [place] we will be able to get him scheduled to come in for his next Nucala injection. Patient verbalized understanding.

## 2019-10-05 ENCOUNTER — Encounter: Payer: BC Managed Care – PPO | Admitting: Nurse Practitioner

## 2019-10-05 NOTE — Telephone Encounter (Signed)
Beth called and spoke with patient regarding this issue.

## 2019-10-05 NOTE — Telephone Encounter (Signed)
Left message for patient to call the office back

## 2019-10-05 NOTE — Telephone Encounter (Signed)
Received a call from [facility] Pre-Reg stating that patient called them and states he had an asthma flare over the weekend and had to take prednisone. Patient wanted to know if this would effect any tests that would be done. Pre-reg states that patient would not call doctor's office himself. I asked pre-reg if I could call back and she states to call the patient back directly.  I called [facility] and front desk, [place], states it should not matter that he took prednisone. Jasmine also states that if he had a recent allergy test they may not do testing.  Please advise.

## 2019-10-06 NOTE — Telephone Encounter (Signed)
Patient was informed of call we received from [facility] pre-registration.  Informed patient that Fredric Mare called directly to office front desk and spoke to [place].  It was noted on appointment notes that patient has been on Prednisone for recent asthma flare.  Advised patient to Dr. Ledell Peoples office to inform them he had taken Xyzal on Monday since his appointment is Thursday.  Patient voiced understanding.

## 2019-10-08 DIAGNOSIS — J4550 Severe persistent asthma, uncomplicated: Secondary | ICD-10-CM | POA: Diagnosis not present

## 2019-10-08 DIAGNOSIS — H1013 Acute atopic conjunctivitis, bilateral: Secondary | ICD-10-CM | POA: Diagnosis not present

## 2019-10-08 DIAGNOSIS — R0602 Shortness of breath: Secondary | ICD-10-CM | POA: Diagnosis not present

## 2019-10-08 DIAGNOSIS — R0789 Other chest pain: Secondary | ICD-10-CM | POA: Diagnosis not present

## 2019-10-09 ENCOUNTER — Telehealth: Payer: Self-pay | Admitting: *Deleted

## 2019-10-09 NOTE — Telephone Encounter (Signed)
Patient called office stating he has had increased chest tightness that started last night.  Patient states he only went one day after being off Prednisone before he started having to use his Albuterol more.  Patient is not sure if Albuterol helps with chest tightness or recent episodes of asthma flares.  Patients states only thing that helps is Prednisone and would like more.  Patient did see Dr. Elijah Birk on 10/08/2019 and will have additional testing done.  No change in medications per patient.  Informed patient he is due for Nucala injection and I will call him back with recommendation from Dr. Delorse Lek and will schedule injection at that time.  Patient voiced understanding.

## 2019-10-09 NOTE — Telephone Encounter (Signed)
Does he still have the Qvar that was recommended early on in his course? If he does still have the Qvar would add 2 puffs twice a day right now to the Breztri 2 puffs twice a day for more inhaled steroid coverage.  I understand he feels better with prednisone however prednisone has long-term side effects if used persistently that I would want to avoid for him if there are other alternatives that prednisone.    If he does not have Qvar anymore then we can re-order or whichever inhaled steroid is preferred in his plan.    Will need to await the additional testing done by Dr. Elijah Birk at [facility].  He is also due for Nucala dose which should help with symptoms.

## 2019-10-09 NOTE — Telephone Encounter (Signed)
I spoke with Jordan [name]. He does have the Qvar inhaler and will add this on as instructed. He verbalized understanding of instructions. I also told him about the long term side effects that you wanted to avoid with prednisone and seemed to accept that. I told him to follow what Dr. Elijah Birk recommends as far as testing goes then we can go from there. He is scheduled for Nucala on 10-12-19.

## 2019-10-12 ENCOUNTER — Telehealth: Payer: Self-pay | Admitting: Allergy & Immunology

## 2019-10-12 ENCOUNTER — Ambulatory Visit: Payer: Self-pay

## 2019-10-12 ENCOUNTER — Other Ambulatory Visit: Payer: Self-pay

## 2019-10-12 MED ORDER — PANTOPRAZOLE SODIUM 20 MG PO TBEC: 20.0000 mg | DELAYED_RELEASE_TABLET | Freq: Every day | ORAL | 0 refills | Status: DC

## 2019-10-12 NOTE — Telephone Encounter (Signed)
Medication sent to [facility] [address]. Patient made aware.

## 2019-10-12 NOTE — Telephone Encounter (Signed)
Patient called and needs to have 90 day supply of protonix 20 mg. [facility] [address]. Because that what ins said they will cover. [phone].

## 2019-10-13 ENCOUNTER — Telehealth: Payer: Self-pay | Admitting: *Deleted

## 2019-10-13 ENCOUNTER — Ambulatory Visit: Payer: Self-pay

## 2019-10-13 NOTE — Telephone Encounter (Signed)
Oh no.  Thanks for letting me know.

## 2019-10-13 NOTE — Telephone Encounter (Signed)
Patient was not able to keep appointment for Nucala injection on 10/12/19 because he did not have transportation.  Appointment was rescheduled for 10/13/19 at 5:00 pm.

## 2019-10-13 NOTE — Telephone Encounter (Signed)
Patient called the office and stated he does not have transportation to the office again today to receive his Nucala injection. Informed patient he needs to get Nucala injection for his asthma since he is past due.  Patient stated he does not drive and has to depend on someone else to drive him. Nucala injection appointment rescheduled to 10/22/19 at 5:00 pm.  I will call patient if we get a cancellation for an earlier appointment.  Patient states he has appointment for his CT per Dr. Elijah Birk on 10/16/19 and vocal cord tests will be 11/24/19.  Patient voiced understanding regarding appointment for Nucala injection.

## 2019-10-16 DIAGNOSIS — J3489 Other specified disorders of nose and nasal sinuses: Secondary | ICD-10-CM | POA: Diagnosis not present

## 2019-10-16 DIAGNOSIS — R0789 Other chest pain: Secondary | ICD-10-CM | POA: Diagnosis not present

## 2019-10-16 DIAGNOSIS — R0602 Shortness of breath: Secondary | ICD-10-CM | POA: Diagnosis not present

## 2019-10-16 DIAGNOSIS — J4550 Severe persistent asthma, uncomplicated: Secondary | ICD-10-CM | POA: Diagnosis not present

## 2019-10-16 DIAGNOSIS — J320 Chronic maxillary sinusitis: Secondary | ICD-10-CM | POA: Diagnosis not present

## 2019-10-22 ENCOUNTER — Ambulatory Visit (INDEPENDENT_AMBULATORY_CARE_PROVIDER_SITE_OTHER): Payer: BC Managed Care – PPO

## 2019-10-22 ENCOUNTER — Other Ambulatory Visit: Payer: Self-pay

## 2019-10-22 ENCOUNTER — Telehealth: Payer: Self-pay

## 2019-10-22 ENCOUNTER — Ambulatory Visit: Payer: Self-pay

## 2019-10-22 DIAGNOSIS — J4550 Severe persistent asthma, uncomplicated: Secondary | ICD-10-CM | POA: Diagnosis not present

## 2019-10-22 DIAGNOSIS — J4540 Moderate persistent asthma, uncomplicated: Secondary | ICD-10-CM

## 2019-10-22 MED ORDER — MEPOLIZUMAB 100 MG ~~LOC~~ SOLR
100.0000 mg | SUBCUTANEOUS | Status: AC
  Administered 2019-10-22 – 2023-08-28 (×31): 100 mg via SUBCUTANEOUS

## 2019-10-22 NOTE — Telephone Encounter (Signed)
Patient is complaining of Xyzal and montelukast being ineffective. Further recommendations?

## 2019-10-22 NOTE — Telephone Encounter (Signed)
Called and spoke with patient and advised. Patient verbalized understanding and advised that he would call back if his symptoms did not improve.

## 2019-10-22 NOTE — Telephone Encounter (Signed)
He could try doubling the Xyzal dose by taking 2 tab a day and see if this works.  There is not a good replacement for Singulair thus if he does not feel it is effective then he can stop and see if symptoms worsen or not.  If symptoms do worsen then it was helping and would resume.   Other antihistamine alternatives would be trying like Ryvent, Ryclora or [place]

## 2019-10-23 ENCOUNTER — Telehealth: Payer: Self-pay | Admitting: Allergy

## 2019-10-23 NOTE — Telephone Encounter (Signed)
Pt got Nucala injection yesterday 10/22/19.  He called today states he had about a quarter size area of swelling at the injection site.  Advised he can ice the area and apply hydrocortisone as needed.

## 2019-10-24 ENCOUNTER — Other Ambulatory Visit: Payer: Self-pay

## 2019-10-24 ENCOUNTER — Other Ambulatory Visit: Payer: Self-pay | Admitting: Allergy

## 2019-10-24 ENCOUNTER — Encounter (HOSPITAL_COMMUNITY): Payer: Self-pay | Admitting: Emergency Medicine

## 2019-10-24 ENCOUNTER — Ambulatory Visit (HOSPITAL_COMMUNITY)
Admission: EM | Admit: 2019-10-24 | Discharge: 2019-10-24 | Disposition: A | Discharge status: Home / Self Care | Payer: BC Managed Care – PPO | Attending: Emergency Medicine | Admitting: Emergency Medicine

## 2019-10-24 DIAGNOSIS — I10 Essential (primary) hypertension: Secondary | ICD-10-CM

## 2019-10-24 MED ORDER — ONDANSETRON HCL 8 MG PO TABS: 8.0000 mg | ORAL_TABLET | Freq: Three times a day (TID) | ORAL | 0 refills | Status: DC | PRN

## 2019-10-24 NOTE — ED Triage Notes (Signed)
Pt c/o high blood pressure around 4 pm he states is was 162/111. Pt is having headaches. He states he does take medication for blood pressure and took it today. Pt states he also has been having some chest pain a few days this week and some soreness in his right arm.

## 2019-10-24 NOTE — Discharge Instructions (Signed)
Blood pressure 143/98 and 138/86  Continue to take amlodipine as prescribed and montior pressure over the next 1-2 weeks Follow up with primary care in 2 weeks if continuing to be high  Follow up here or emergency room if symptoms changing, worsening or any concerns  Please go to Emergency Room if you start to experience severe headache, vision changes, decreased urine production, chest pain, shortness of breath, speech slurring, one sided weakness.

## 2019-10-24 NOTE — Progress Notes (Signed)
He states he has been feeling nauseous, non-stop runny nose.   He states he used his Breztri this morning and then a little later had to use his albuterol as he was coughing.   He is using the Azelastine 2 sprays twice a day but does not feel it is helping.   He does have nasal atrovent but states has not been using this.  Advised to use the atrovent 2 sprays each nostril 2-3 times today.  Do believe his nasal drainage is leading to nausea from swallowed mucus and also causing cough.   Will send in zofran to help with nausea sensation while trying to decreasing nasal mucus production with his nasal sprays.

## 2019-10-25 NOTE — ED Provider Notes (Addendum)
[facility]    CSN: [id number] Arrival date & time: 10/24/19  1722      History   Chief Complaint Chief Complaint  Patient presents with  . Hypertension    HPI Jordan [name] is a 44 y.o. male history of hypertension, asthma, presenting today for evaluation of elevated blood pressure.  Patient reports that recently he has noted that his blood pressure has been uncontrolled.  He feels his blood pressures variable throughout the day.  He often will check his blood pressure approximately 20 minutes apart.  Today he noticed his blood pressure went up to 162/111.  He also reports that he has had some intermittent chest pain which he describes as soreness.  This has been going on for a long time.  He has had prior CTs which were negative.  Uses Naprosyn for this.  He also reports some occasional headaches.  He is currently on amlodipine 5 mg daily.  Denies any recent changes or new medicines.  He did receive a nucala injection yesterday for his asthma.  He has noticed some redness around the injection site and some soreness here as well.  HPI  Past Medical History:  Diagnosis Date  . Asthma   . Reflux esophagitis     Patient Active Problem List   Diagnosis Date Noted  . Elevated LFTs 07/29/2019  . Elevated LDL cholesterol level 07/29/2019  . SOB (shortness of breath) 07/27/2019  . Seasonal and perennial allergic rhinoconjunctivitis 07/20/2019  . Asthma 07/20/2019  . Heartburn 07/20/2019  . Stress and adjustment reaction 07/14/2019  . Healthcare maintenance 07/14/2019  . Hypertension 07/14/2019    Past Surgical History:  Procedure Laterality Date  . NO PAST SURGERIES         Home Medications    Prior to Admission medications   Medication Sig Start Date End Date Taking? Authorizing Provider  albuterol (PROAIR HFA) 108 (90 Base) MCG/ACT inhaler Inhale 1-2 puffs into the lungs every 6 (six) hours as needed for wheezing or shortness of breath. 09/01/19  Yes Padgett,  Pilar Grammes, MD  amLODipine (NORVASC) 5 MG tablet Take 1 tablet (5 mg total) by mouth daily. 09/18/19  Yes Nche, Bonna Gains, NP  azelastine (ASTELIN) 0.1 % nasal spray 2 sprays per nostril 2 times daily as needed for drainage. 09/11/19  Yes Padgett, Pilar Grammes, MD  Budeson-Glycopyrrol-Formoterol (BREZTRI AEROSPHERE) 160-9-4.8 MCG/ACT AERO Inhale 2 puffs into the lungs in the morning and at bedtime. 09/01/19  Yes Padgett, Pilar Grammes, MD  ibuprofen (ADVIL) 800 MG tablet Take 1 tablet (800 mg total) by mouth 3 (three) times daily. 07/30/19  Yes Hall-Potvin, [place], PA-C  ipratropium-albuterol (DUONEB) 0.5-2.5 (3) MG/3ML SOLN Take 3 mLs by nebulization every 6 (six) hours as needed (wheezing, shortness of breath, chest tightness). 07/25/19  Yes Alfonse Spruce, MD  montelukast (SINGULAIR) 10 MG tablet Take 1 tablet (10 mg total) by mouth at bedtime. 09/10/19  Yes Padgett, Pilar Grammes, MD  naproxen (NAPROSYN) 500 MG tablet Take 1 tablet (500 mg total) by mouth 2 (two) times daily with a meal. 09/15/19  Yes Nche, Bonna Gains, NP  ondansetron (ZOFRAN) 8 MG tablet Take 1 tablet (8 mg total) by mouth every 8 (eight) hours as needed for nausea or vomiting. 10/24/19  Yes Padgett, Pilar Grammes, MD  pantoprazole (PROTONIX) 20 MG tablet Take 1 tablet (20 mg total) by mouth daily. 10/12/19  Yes Alfonse Spruce, MD  Spacer/Aero-Hold Chamber Mask MISC 1 each by Other route 3 times/day  as needed-between meals & bedtime. 07/25/19  Yes Alfonse Spruce, MD    Family History Family History  Problem Relation Age of Onset  . Healthy Mother   . Healthy Father     Social History Social History   Tobacco Use  . Smoking status: Never Smoker  . Smokeless tobacco: Never Used  Substance Use Topics  . Alcohol use: Yes    Comment: occ  . Drug use: No     Allergies   Patient has no known allergies.   Review of Systems Review of Systems  Constitutional: Negative for fatigue  and fever.  HENT: Negative for congestion, sinus pressure and sore throat.   Eyes: Negative for photophobia, pain and visual disturbance.  Respiratory: Negative for cough and shortness of breath.   Cardiovascular: Positive for chest pain. Negative for leg swelling.  Gastrointestinal: Negative for abdominal pain, nausea and vomiting.  Genitourinary: Negative for decreased urine volume and hematuria.  Musculoskeletal: Negative for myalgias, neck pain and neck stiffness.  Neurological: Positive for headaches. Negative for dizziness, syncope, facial asymmetry, speech difficulty, weakness, light-headedness and numbness.     Physical Exam Triage Vital Signs ED Triage Vitals  Enc Vitals Group     BP 10/24/19 1757 (!) 143/98     Pulse Rate 10/24/19 1757 83     Resp 10/24/19 1757 18     Temp 10/24/19 1757 98.8 F (37.1 C)     Temp Source 10/24/19 1757 Oral     SpO2 10/24/19 1757 100 %     Weight --      Height --      Head Circumference --      Peak Flow --      Pain Score 10/24/19 1800 3     Pain Loc --      Pain Edu? --      Excl. in GC? --    No data found.  Updated Vital Signs BP (!) 143/98 (BP Location: Right Arm)   Pulse 83   Temp 98.8 F (37.1 C) (Oral)   Resp 18   SpO2 100%  BP rechecked 138/86 Visual Acuity Right Eye Distance:   Left Eye Distance:   Bilateral Distance:    Right Eye Near:   Left Eye Near:    Bilateral Near:     Physical Exam Vitals and nursing note reviewed.  Constitutional:      Appearance: He is well-developed.     Comments: No acute distress  HENT:     Head: Normocephalic and atraumatic.     Ears:     Comments: Bilateral ears without tenderness to palpation of external auricle, tragus and mastoid, EAC's without erythema or swelling, TM's with good bony landmarks and cone of light. Non erythematous.     Nose: Nose normal.     Mouth/Throat:     Comments: Oral mucosa pink and moist, no tonsillar enlargement or exudate. Posterior pharynx  patent and nonerythematous, no uvula deviation or swelling. Normal phonation. Eyes:     Extraocular Movements: Extraocular movements intact.     Conjunctiva/sclera: Conjunctivae normal.     Pupils: Pupils are equal, round, and reactive to light.  Cardiovascular:     Rate and Rhythm: Normal rate and regular rhythm.  Pulmonary:     Effort: Pulmonary effort is normal. No respiratory distress.     Comments: Breathing comfortably at rest, CTABL, no wheezing, rales or other adventitious sounds auscultated Abdominal:     General: There is no distension.  Musculoskeletal:        General: Normal range of motion.     Cervical back: Neck supple.     Comments: Full active range of motion of shoulder and elbow  Skin:    General: Skin is warm and dry.     Comments: Right upper arm with area of ecchymosis and slight swelling and tenderness   Neurological:     General: No focal deficit present.     Mental Status: He is alert and oriented to person, place, and time. Mental status is at baseline.     Comments: Patient A&O x3, cranial nerves II-XII grossly intact, strength at shoulders, hips and knees 5/5, equal bilaterally.  Negative Romberg. Gait without abnormality.      UC Treatments / Results  Labs (all labs ordered are listed, but only abnormal results are displayed) Labs Reviewed - No data to display  EKG   Radiology No results found.  Procedures Procedures (including critical care time)  Medications Ordered in UC Medications - No data to display  Initial Impression / Assessment and Plan / UC Course  I have reviewed the triage vital signs and the nursing notes.  Pertinent labs & imaging results that were available during my care of the patient were reviewed by me and considered in my medical decision making (see chart for details).     EKG normal sinus rhythm, no acute signs of ischemia or infarction.  Blood pressure reassuring today.  No neuro deficits on exam.  Recommended  to continue amlodipine and continue monitoring blood pressure at home.  Follow-up with primary care if continuing to notice blood pressure spiking up at times.  Discussed warning signs to follow-up in emergency room.  Discussed strict return precautions. Patient verbalized understanding and is agreeable with plan.  Final Clinical Impressions(s) / UC Diagnoses   Final diagnoses:  Essential hypertension     Discharge Instructions     Blood pressure 143/98 and 138/86  Continue to take amlodipine as prescribed and montior pressure over the next 1-2 weeks Follow up with primary care in 2 weeks if continuing to be high  Follow up here or emergency room if symptoms changing, worsening or any concerns  Please go to Emergency Room if you start to experience severe headache, vision changes, decreased urine production, chest pain, shortness of breath, speech slurring, one sided weakness.   ED Prescriptions    None     PDMP not reviewed this encounter.   Joneen Caraway Young C, PA-C 10/25/19 1007    Lavaeh Bau, Gray C, PA-C 10/25/19 1008

## 2019-10-28 ENCOUNTER — Telehealth: Payer: Self-pay

## 2019-10-28 NOTE — Telephone Encounter (Signed)
Correct fax # is [phone]

## 2019-10-28 NOTE — Telephone Encounter (Signed)
Patient would like it emailed at [email or web address] or faxed to [phone].

## 2019-10-28 NOTE — Telephone Encounter (Signed)
Per Dr. Delorse Lek, Can someone please call Jordan [name] back at [phone] and see what he is requesting. I spoke with patient, he stated that Dr. Delorse Lek had filled out a physical questionnaire from his job back in March due to patient not being able to perform his job duties or do anything physical. He also stated that he is not able to work or drive either. He has court Friday and is needing a letter with updated information stating that he is still under the doctors care and is not able to work or drive due to him not being able to do anything physical. Please advise.

## 2019-10-29 ENCOUNTER — Telehealth: Payer: Self-pay | Admitting: Allergy

## 2019-10-29 NOTE — Telephone Encounter (Signed)
Please treat the following is a note:   -------------------------------------------------------------------------  To whom it may concern:  Gal Feldhaus is a patient of mine at the [facility] [place] for management of seasonal and perennial allergic rhinitis as well as persistent asthma.  He is currently under my care as well as the care of other specialty physicians to help with the management of his asthma and allergies.  Due to his persistent asthma and ongoing symptoms I recommended that he refrain from doing functions at work that involves physical activity as this has been a trigger of his asthma symptoms.  I recommended that if he is able to do desk work or other functions that does not involve physical activity that these would be appropriate work duties for him to decrease risk of having an exacerbation of his asthma.  It was then left to his work to determine if they have any duties that are suitable he is still on undergoing further work-up and testing.  He has a medication regimen in place to help with improving his asthma and allergy control including maintenance inhaler medications, rescue inhaler medications less allergy medications.

## 2019-10-29 NOTE — Telephone Encounter (Signed)
Patient called and wanted to know if you have sent the paperwork.

## 2019-10-29 NOTE — Telephone Encounter (Addendum)
Note from Dr. Delorse Lek faxed to [facility] at [phone].  Patient notified.

## 2019-10-29 NOTE — Telephone Encounter (Signed)
Please treat the following is a note:     -------------------------------------------------------------------------   To whom it may concern:   Jordan [name] is a patient of mine at the [facility] [place] for management of seasonal and perennial allergic rhinitis as well as persistent asthma.  He is currently under my care as well as the care of other specialty physicians to help with the management of his asthma and allergies.  Due to his persistent asthma and ongoing symptoms I recommended that he refrain from doing functions at work that involves physical activity as this has been a trigger of his asthma symptoms.  I recommended that if he is able to do desk work or other functions that does not involve physical activity that these would be appropriate work duties for him to decrease risk of having an exacerbation of his asthma.  It was to be determined by his employer if they have any duties that are suitable for him.  He has a medication regimen in place to help with improving his asthma and allergy control including maintenance inhaler medications, rescue inhaler medications less allergy medications.  Sincerely,      Margo Aye, MD [facility]

## 2019-10-30 NOTE — Telephone Encounter (Signed)
Per Dr. Delorse Lek stated she had filled out a letter patient needed for court order, and per Patsy patient is faxing over even more paperwork for Dr. Delorse Lek to complete. At this time we have not receive any paperwork.

## 2019-11-01 ENCOUNTER — Telehealth: Payer: Self-pay | Admitting: Allergy

## 2019-11-01 NOTE — Telephone Encounter (Signed)
Patient called stating that he couldn't get any words out or speak for 1-2 hours. Denies coughing, wheezing, fevers with this. No other symptoms - chest pain, headaches, dizziness, stroke like symptoms.   He took duoneb around 3 PM which was before this episode occurred and around 5:30PM he took albuterol which helped as he is now able to speak.   He was speaking in full sentences on the phone but difficult to understand as he does not speak very clearly to begin with.   He just called to let us know as this has never happened to him before.  Advised patient that I'm not sure what caused this and it is difficult to assess this over the phone.  Advised him to monitor symptoms and if worsening to go to ER/urgent care for further evaluation.

## 2019-11-02 NOTE — Telephone Encounter (Signed)
Forms have been received and placed on Dr. Randell Patient desk for review. Patient called today to see if the forms were in fact received and was informed that Dr. Delorse Lek is out of the office today but would be back tomorrow 11/03/2019. Patient asked if the forms could be completed and faxed back to the sender within 3-4 days.

## 2019-11-03 NOTE — Telephone Encounter (Signed)
Spoke with patient stated that it was Short-term disability forms that needed to be completed. I advised patient to allow 5 business days.

## 2019-11-03 NOTE — Telephone Encounter (Signed)
Ok thanks 

## 2019-11-03 NOTE — Telephone Encounter (Signed)
Please inform pt that paperwork can take 5 business days from when it is received in office to get completed if appropriate for [place] to complete.

## 2019-11-05 NOTE — Telephone Encounter (Signed)
Called and left patient message letting him know that his disability paperwork is ready and to callback to let us know if he wanted it mailed or pick it up.

## 2019-11-05 NOTE — Telephone Encounter (Signed)
Patient is going to send someone to pick up the forms.

## 2019-11-08 ENCOUNTER — Telehealth: Payer: Self-pay | Admitting: Allergy & Immunology

## 2019-11-08 MED ORDER — BREZTRI AEROSPHERE 160-9-4.8 MCG/ACT IN AERO: 2.0000 | INHALATION_SPRAY | Freq: Two times a day (BID) | RESPIRATORY_TRACT | 5 refills | Status: DC

## 2019-11-08 NOTE — Telephone Encounter (Signed)
Patient called to request a Breztri refill. Sent in the script.   Malachi Bonds, MD [facility]

## 2019-11-08 NOTE — Telephone Encounter (Signed)
Patient called back to say his script was $800. He did not pay it and requested samples be left at the front desk of [place] office for him to pick up.   Routing to Dr. Delorse Lek, as she might want to consider a different controller medication.   Malachi Bonds, MD [facility] [place]

## 2019-11-09 NOTE — Telephone Encounter (Signed)
Please advise 

## 2019-11-10 ENCOUNTER — Other Ambulatory Visit: Payer: Self-pay

## 2019-11-10 MED ORDER — TRELEGY ELLIPTA 200-62.5-25 MCG/INH IN AEPB: 1.0000 | INHALATION_SPRAY | Freq: Every day | RESPIRATORY_TRACT | 5 refills | Status: DC

## 2019-11-10 NOTE — Telephone Encounter (Signed)
While he is undergoing his continued work-up with Dr. Elijah Birk at [facility] (he has upcoming ENT appt as well) will change his Breztri to Trelegy 1 puff daily.  This should be covered under an asthma diagnosis and will be comparable to [place].

## 2019-11-10 NOTE — Telephone Encounter (Signed)
Std paper work received but not completed will complete and have dr sign and then fax to appropriate office

## 2019-11-10 NOTE — Telephone Encounter (Signed)
Patient has been informed of the inhaler change. Patient is wondering if a fax was received from the short term disability company on yesterday?

## 2019-11-10 NOTE — Telephone Encounter (Signed)
Lm for pt to call us back  I did go ahead and send in rx for trelegy

## 2019-11-20 ENCOUNTER — Other Ambulatory Visit: Payer: Self-pay

## 2019-11-20 MED ORDER — TRELEGY ELLIPTA 200-62.5-25 MCG/INH IN AEPB: 1.0000 | INHALATION_SPRAY | Freq: Every day | RESPIRATORY_TRACT | 1 refills | Status: DC

## 2019-11-20 NOTE — Telephone Encounter (Signed)
Patient called stating his insurance will only cover Trelegy if it's sent in with a quantity of 180 and it needed to be sent to [facility] on [address]

## 2019-11-21 ENCOUNTER — Other Ambulatory Visit: Payer: Self-pay | Admitting: Allergy

## 2019-11-21 MED ORDER — AZITHROMYCIN 250 MG PO TABS: ORAL_TABLET | ORAL | 0 refills | Status: DC

## 2019-11-21 NOTE — Progress Notes (Signed)
Called by pt.  He states he is having a lot of nasal congestion, drainage and pressure behind the eyes. This has been ongoing for days now.  He has ENT with [facility] appt next Tuesday.   He states is using nasal atrovent q3-4 hours and alternates that with astelin.  In between those he is using nasal saline spray.  He also reports taking claritin 2-3 times a day as well as daily singulair.   He audibly sounds very congested and he throat cleared many times during phone call.   Due to days of continued sinus symptoms not improving with standard meds as above will send a Zpak and will await recommendations from ENT.

## 2019-11-23 ENCOUNTER — Telehealth: Payer: Self-pay

## 2019-11-23 MED ORDER — BREZTRI AEROSPHERE 160-9-4.8 MCG/ACT IN AERO: 2.0000 | INHALATION_SPRAY | Freq: Two times a day (BID) | RESPIRATORY_TRACT | 0 refills | Status: DC

## 2019-11-23 NOTE — Telephone Encounter (Signed)
Patient called stating he was switched to Trelegy because [employer] was to expensive. Patient now has a coupon with [employer] that will allow him to get it for Free till the end of the year as long as it is a 90 Day Supply.  [facility]

## 2019-11-23 NOTE — Telephone Encounter (Signed)
90 day of Jordan [name] has been sent to requested pharmacy. Patient has been notified.

## 2019-11-24 DIAGNOSIS — J383 Other diseases of vocal cords: Secondary | ICD-10-CM | POA: Diagnosis not present

## 2019-11-24 DIAGNOSIS — R0600 Dyspnea, unspecified: Secondary | ICD-10-CM | POA: Diagnosis not present

## 2019-11-24 DIAGNOSIS — M940 Chondrocostal junction syndrome [Tietze]: Secondary | ICD-10-CM | POA: Diagnosis not present

## 2019-11-24 DIAGNOSIS — R0602 Shortness of breath: Secondary | ICD-10-CM | POA: Diagnosis not present

## 2019-11-24 DIAGNOSIS — J384 Edema of larynx: Secondary | ICD-10-CM | POA: Diagnosis not present

## 2019-11-24 DIAGNOSIS — Z79891 Long term (current) use of opiate analgesic: Secondary | ICD-10-CM | POA: Diagnosis not present

## 2019-11-25 ENCOUNTER — Telehealth: Payer: Self-pay

## 2019-11-25 NOTE — Telephone Encounter (Signed)
Patient called wanting to give you an update on his appointment yesterday with ENT and Vocal Therapist. He states the visit with ENT went well and they scoped him and noticed "his opening was not opening when he breathes, it's closed" per patient. Also, he went to see the vocal therapist and he felt that she was being very dismissive and irritated because she could not understand what he was saying and he was not understanding her questions. He states she advised him to continue doing everything he was told to do by his other doctors. Patient expressed that he does not want to go back to see her and would rather see one in [place]. If there isn't one he will not continue with the therapy.

## 2019-12-02 ENCOUNTER — Telehealth: Payer: Self-pay

## 2019-12-02 NOTE — Telephone Encounter (Signed)
Thanks Kayla.   To best help with his symptoms I really need to know the results from ENT visit and if VCD or abnormal vocal cord movement was seen on his rhinoscopy.  For some reason the ENT note from Dr. Delford Field is not loading.  Can we call their office to see if it can be faxed to [place].  If he does have VCD this really changes his treatment plan and recommendations.   Thanks for reinforcing his sinus allergy regimen and he needs to be utilizing the breathing techniques he went over with speech therapy.

## 2019-12-02 NOTE — Telephone Encounter (Signed)
Note is available in epic now.

## 2019-12-02 NOTE — Telephone Encounter (Signed)
Patient calls this morning with complaints of forced inspiration. He denies chest tightness, chest pains, lightheadedness, dizziness. He does not really report a cough. He is using the Breztri, Qvar and Atrovent as instructed. He has not used albuterol as of now. I did let him know when the albuterol should be used and he acknowledged understanding of this. He is not doing nasal rinses. He tells me that he is doing the the techniques recommended by the other provider.  I spoke with Dr. Delorse Lek and per our conversation I went over the most recent discharge instruction sheet from Dr. Delorse Lek & Dr. Dellis Anes. Patient will do the nasal rinses. I also told him that per Dr. Delorse Lek he needs to contact ENT to see if they have further recommendations. He did verbalize understanding of the instructions. I told him that if he began feeling as if he was in distress he should contact 911.

## 2019-12-02 NOTE — Telephone Encounter (Signed)
FYI

## 2019-12-03 ENCOUNTER — Telehealth: Payer: Self-pay | Admitting: Allergy

## 2019-12-03 NOTE — Telephone Encounter (Signed)
Patient called about paperwork that was faxed from [facility]. Confirmed with patient that paperwork was received. Patient would like to know when paperwork is faxed back and would like a copy of it for hisself.  Please advise.

## 2019-12-03 NOTE — Telephone Encounter (Signed)
Forms were received and Dr. Delorse Lek is working on them.

## 2019-12-04 NOTE — Telephone Encounter (Signed)
Forms completed, faxed to the [facility] and a copy placed up front for patient. Patient is aware that forms are done.

## 2019-12-06 ENCOUNTER — Telehealth: Payer: Self-pay | Admitting: Allergy

## 2019-12-06 NOTE — Telephone Encounter (Signed)
Patient called stating that he feels like he has a bad cold all of a sudden. When asked about specific symptoms he states that he is coughing and has a sore throat. Denies fevers or chills. He used his nasal spray, inhalers with no benefit. It was unclear as to whether he did his VCD exercise. I advised him for the sore throat to get cough drops. He can also do salt water gargles or drink some warm tea to soothe the sore throat. Do the VCD exercises for the cough.  Otherwise he was speaking in clear sentences. He states he checked his pulse ox which was 95% which is around his normal.  Advised him that if he's not better to call back.

## 2019-12-09 ENCOUNTER — Telehealth: Payer: Self-pay | Admitting: Allergy

## 2019-12-09 NOTE — Telephone Encounter (Signed)
Patient called and he needs to pass along information about his short term disability forms.

## 2019-12-10 NOTE — Telephone Encounter (Signed)
Paperwork was placed up front last week  and patient was aware. He is aware again.

## 2019-12-10 NOTE — Telephone Encounter (Signed)
Patient called and would like a copy of the [place] forms that were faxed out yesterday.  Please advise.

## 2019-12-14 ENCOUNTER — Telehealth: Payer: Self-pay | Admitting: Allergy

## 2019-12-14 NOTE — Telephone Encounter (Signed)
Called and spoke with patient.

## 2019-12-14 NOTE — Telephone Encounter (Signed)
Patient called and needs to have Qvar 3 month supply called into [facility] [address] [phone]. He also has some question for the nurse. Marland Kitchen

## 2019-12-14 NOTE — Telephone Encounter (Signed)
Okay to refill Qvar for 90 days. Also stated he had experience extreme coughing spells over this past weekend and is currently taking all his current regimen medications as prescribed. He states that his cough is getting worse and notices it when he sits down. Normally he starts to experience other symptoms that leads up to a sinus infection. He was wondering if he should be on some prednisone to help with his coughing and possible sinus issues. On the other note patient wanted to voice a complaint on Dr. Lucie Leather for the services he provided when patient tried calling over the weekend. Patient stated that he told Dr. Lucie Leather his concern with the symptoms regarding his cough and stated that Dr. Lucie Leather was not really helping him out with a treatment plan and wanted to see what he could do to help his cough. Patient also mentioned that Dr. Lucie Leather was being condescending and was not concern for his medical needs as he was told "Do you know what time it is" according to the patient. I advise patient that I am not able to handle any complaints and will advise our management team to voice patient's concerns. Patient verbalized understanding and hopes to get this matter handled. As for his medication and symptoms I advise patient that we will let the provider know and go from there. Please advise to both concerns.

## 2019-12-14 NOTE — Telephone Encounter (Signed)
Patient called and states he wanted to talk to a male nurse not the male nurse. Patient was transferred to the nurses and when [facility] answered, the patient hung up. Patient called back and states he had a complaint that he wanted to talk to a male nurse about.

## 2019-12-15 NOTE — Telephone Encounter (Signed)
Ok thanks.   Can you go ahead and refill his inhalers.   Have him make an appt to discuss his medication management options. Will also need to review with him what the plan(s) have been in regarding Dr. Elijah Birk, allergist, and Dr. Delford Field, ENT, that he is seeing.

## 2019-12-15 NOTE — Telephone Encounter (Signed)
He hasn't been seen in a while.  He likely needs a visit.  I know he is still undergoing testing with allergist at [facility].   He is using the Qvar and Breztri (or Trelegy)?   I know there was an issue with cost regarding either Breztri or Trelegy.   Ok to refill the Qvar however would want to clarify the inhaler regimen he is currently doing.

## 2019-12-15 NOTE — Telephone Encounter (Addendum)
Spoke with Jordan [name] last evening at great length regarding his dissatisfaction from calling the on call provider over the weekend and what his symptoms are current moment.  Patient states he does still have nonproductive cough that has gotten worse since Saturday.  Increased nasal congested and post nasal drip. Reflux is worse.  Patient has been out of Qvar and Albuterol inhaler.  Patient has been using Albuterol nebulizer treatments every 3-4 hours and does get short term relief. He also is continuing to use [place] daily. Patient is using nasal spray, taking Montelukast, Xyzal twice a day and Omeprazole twice a day.  Patient needs Qvar and Albuterol refilled.  Patient would like Prednisone to help with cough and congestion.  Offered patient and appointment today in [place], but he did not want to see Dr. Dellis Anes.  Patient prefers Dr. Delorse Lek, Dr. Selena Batten or NP.  Patient is aware that Dr. Delorse Lek would get my message and update on Tuesday morning.  Patient voiced understanding.

## 2019-12-16 MED ORDER — QVAR REDIHALER 80 MCG/ACT IN AERB
2.0000 | INHALATION_SPRAY | Freq: Two times a day (BID) | RESPIRATORY_TRACT | 0 refills | Status: DC
Start: 2019-12-16 — End: 2019-12-24

## 2019-12-16 MED ORDER — ALBUTEROL SULFATE HFA 108 (90 BASE) MCG/ACT IN AERS: 1.0000 | INHALATION_SPRAY | Freq: Four times a day (QID) | RESPIRATORY_TRACT | 1 refills | Status: DC | PRN

## 2019-12-16 MED ORDER — MONTELUKAST SODIUM 10 MG PO TABS
10.0000 mg | ORAL_TABLET | Freq: Every day | ORAL | 0 refills | Status: DC
Start: 2019-12-16 — End: 2019-12-24

## 2019-12-16 NOTE — Telephone Encounter (Signed)
Left message for patient to call the office and he can let them know he is returning my call.  Need to see if patient is available to come to the office for visit with Dr. Delorse Lek on 12/18/19 at 2:30 pm.  Refills on inhalers sent to pharmacy.

## 2019-12-16 NOTE — Telephone Encounter (Signed)
Returned call to patient.  Informed patient I had spoke with Tammy and she would order Nucala since his approval was still good.  Offered patient appointment with Dr. Delorse Lek for Thursday, July 29 at 3:30 pm and he will receive Nucala injection if okay per Dr. Delorse Lek.  Patient is aware that he needs to be here not later than 4:00 for office visit given his transportation situation.  Patient was told to contact the office during office hours if he had any change before appointment to where he could talk to one of the office staff or get message to Dr. Delorse Lek.  Patient is aware he can see primary care or go to Urgent Care, but he states they will not do anything to override what Dr. Delorse Lek does.  Patient aware appropriate refills will be sent to [facility] per patient request for 90 day refill and voiced understanding of appointment with Dr. Delorse Lek on 12/24/19.

## 2019-12-16 NOTE — Telephone Encounter (Signed)
Montelukast 10 mg-90 day supply with no refill, Qvar 80 mcg 2 Puffs bid(Plan allows max 21.2 per 25 day supply) with no refill and  ProAir HFA 18 with 1 refill sent to [facility].  Called and informed patient that his plan had limits and not all medications were allowed at 90 day supply.  Informed him that [facility] and spoke with Revonda Standard in the pharmacy regarding Albuterol and confirmed it is not under the restrictions of maintenance medication and can be just 30 day supply.  Informed patient that Pantoprazole has quantity limitations and per Dr. Delorse Lek he can continue Omeprazole and will discuss trial of Dexilant with samples from the office at his office visit.  Patient voiced understanding.

## 2019-12-16 NOTE — Telephone Encounter (Signed)
Patient returned call to the office.  Informed patient that Dr. Delorse Lek wanted him to be seen in the office.   Explained to paitent due to complexity of his issues and he is past due for Nucala injection, it is best for face to face in person office visit.  Patient was given appointment for Friday, July 23 at 2:30 pm.  Patient stated he can't do that time and has to have appointment later in day due to transportation.  Offered patient appointment for Thursday, July 22 at 3:10 pm and patient stated he needed appointment for 4:00 pm.  Informed patient that Dr. Delorse Lek did not have any late appointment time available until next week.   Informed patient I would speak with Tammy regarding his Nucala since we did not have his next supply of medication here in the office.  Confirmed insurance and patient stated he had a termination of BCBS recently by his employer, but insurance was reinstated now.   Informed patient I would discuss appointment times with Dr. Delorse Lek and will discuss Nucala shipment with Tammy.  Will also discuss medication refills with Dr. Delorse Lek since he has now asked for 90 day refill on almost all of his medications.  Did inform patient that nebulizer medications would be addressed at office visit.  Confirmed he had enough medication for Nebulizer and Breztri at this time.

## 2019-12-18 ENCOUNTER — Ambulatory Visit: Payer: BC Managed Care – PPO | Admitting: Allergy

## 2019-12-21 ENCOUNTER — Other Ambulatory Visit: Payer: Self-pay

## 2019-12-21 ENCOUNTER — Telehealth: Payer: Self-pay

## 2019-12-21 NOTE — Telephone Encounter (Signed)
Patient called to see if a nurse could give him a call back.   Please Advise

## 2019-12-21 NOTE — Telephone Encounter (Signed)
Called and spoke with patient.  He wanted to inform Dr. Delorse Lek that he had flare last night that lasted until this morning.  He did nebulizer treatment 2-3 times.  Patient complained of increased nasal drainage and fullness in ears.  Patient states he had chills at one point, but no fever.  Patient states he has limited activities outdoors while temperatures are high and high humidity.  Patient did get  the pharmacy and medication refills picked up.  Patient has been taking Breztri and Qvar as instructed.  Patient plans to keep appointment with Dr Delorse Lek on Thursday for office visit and injection.  Patient wanted to know if Dr. Delorse Lek had any other suggestion since he had flare till office visit.  Informed patient message would go to Dr. Delorse Lek on Tuesday.  Patient aware to contact office if he worsens or go to the Emergency Room or Urgent Care if he chooses not to contact PCP.  Patient voiced understanding.

## 2019-12-22 ENCOUNTER — Encounter: Payer: BC Managed Care – PPO | Admitting: Nurse Practitioner

## 2019-12-22 NOTE — Telephone Encounter (Signed)
Thanks [employer].  Will need to discuss at his visit regarding speech therapy.  I think he would have good benefit with continued therapy as recommended by speech therapy for breath re-training however per documentation it appears he had them cancel his appointments.  If he would prefer a local speech therapist we can see if the [facility] therapist can provide services as outlined by the [facility] therapist.  He also needs to follow-up with Dr. Elijah Birk as he was also going to do a methacholine challenge to see about his asthma and this is a study we do not have access to perform.   This test is important as he is already on max dosing of inhalers as well as Nucala and these should be provided more benefit that it seems thus the methacholine challenge can be very helpful in determining why he is not responding as well to standard asthma therapy.   The Qvar can be increased to 3 puffs 3 times a day until the visit if he is still feeling symptomatic and needing to use albuterol.

## 2019-12-23 NOTE — Telephone Encounter (Signed)
Left message for patient to call the office.  Patient does have office visit with Dr. Delorse Lek tomorrow and recommendation can be discussed at that time.  Patient can increase Qvar to 3 Puffs 3 times until visit.

## 2019-12-24 ENCOUNTER — Other Ambulatory Visit: Payer: Self-pay

## 2019-12-24 ENCOUNTER — Ambulatory Visit: Payer: BC Managed Care – PPO | Admitting: Allergy

## 2019-12-24 ENCOUNTER — Ambulatory Visit (INDEPENDENT_AMBULATORY_CARE_PROVIDER_SITE_OTHER): Payer: BC Managed Care – PPO

## 2019-12-24 ENCOUNTER — Encounter: Payer: Self-pay | Admitting: Allergy

## 2019-12-24 VITALS — BP 130/86 | HR 97 | Temp 98.8°F | Resp 16 | Ht 69.0 in | Wt 177.8 lb

## 2019-12-24 DIAGNOSIS — H1010 Acute atopic conjunctivitis, unspecified eye: Secondary | ICD-10-CM | POA: Diagnosis not present

## 2019-12-24 DIAGNOSIS — J3089 Other allergic rhinitis: Secondary | ICD-10-CM

## 2019-12-24 DIAGNOSIS — J302 Other seasonal allergic rhinitis: Secondary | ICD-10-CM

## 2019-12-24 DIAGNOSIS — J4550 Severe persistent asthma, uncomplicated: Secondary | ICD-10-CM | POA: Diagnosis not present

## 2019-12-24 DIAGNOSIS — R12 Heartburn: Secondary | ICD-10-CM

## 2019-12-24 DIAGNOSIS — J4540 Moderate persistent asthma, uncomplicated: Secondary | ICD-10-CM

## 2019-12-24 MED ORDER — ALBUTEROL SULFATE HFA 108 (90 BASE) MCG/ACT IN AERS: 1.0000 | INHALATION_SPRAY | Freq: Four times a day (QID) | RESPIRATORY_TRACT | 1 refills | Status: DC | PRN

## 2019-12-24 MED ORDER — BREZTRI AEROSPHERE 160-9-4.8 MCG/ACT IN AERO: 2.0000 | INHALATION_SPRAY | Freq: Two times a day (BID) | RESPIRATORY_TRACT | 3 refills | Status: DC

## 2019-12-24 MED ORDER — PANTOPRAZOLE SODIUM 20 MG PO TBEC: 20.0000 mg | DELAYED_RELEASE_TABLET | Freq: Every day | ORAL | 0 refills | Status: DC

## 2019-12-24 MED ORDER — DEXCHLORPHENIRAMINE MALEATE 2 MG/5ML PO SOLN: ORAL | 1 refills | Status: DC

## 2019-12-24 MED ORDER — MONTELUKAST SODIUM 10 MG PO TABS: 10.0000 mg | ORAL_TABLET | Freq: Every day | ORAL | 0 refills | Status: DC

## 2019-12-24 MED ORDER — QVAR REDIHALER 80 MCG/ACT IN AERB: 2.0000 | INHALATION_SPRAY | Freq: Two times a day (BID) | RESPIRATORY_TRACT | 3 refills | Status: DC

## 2019-12-24 NOTE — Patient Instructions (Addendum)
Asthma:  . Lung testing looks great today! . Daily controller medication(s):  o Continue Breztri 2 puffs twice a day and Qvar 2 puffs twice a day with spacer and rinse mouth afterwards. o During flare ups can use Qvar 3 times a day until symptoms o Continue Singulair 10mg  daily at night.  . Prior to physical activity: May use albuterol rescue inhaler 2 puffs 5 to 15 minutes prior to strenuous physical activities. Rescue medications: May use albuterol rescue inhaler 2 puffs or nebulizer every 4 to 6 hours as needed for shortness of breath, chest tightness, coughing, and wheezing. Monitor frequency of use. . Continue Nucala injections for now (provided today) . Continue follow-up with Dr. Marland Kitchen who recommended performing methacholine challenge.   Recommend getting this scheduled with her to classify better your breathing issues. . Also recommend continuing speech therapy for breath re-training.  We can identify speech therapist with [facility] who may be able to implement therapy as recommended by the [facility] ENT/Speech team.   . Control goals:  o Full participation in all desired activities (may need albuterol before activity) o Albuterol use two times or less a week on average (not counting use with activity) o Cough interfering with sleep two times or less a month o Oral steroids no more than once a year o No hospitalization  Heartburn  Continue pantoprazole 20mg  in the morning. Nothing to eat or drink for 30 minutes.  Seasonal and perennial allergic rhinoconjunctivitis Past history - 2021 skin testing was positive to grass, tree, mold, dust mites.   Nose very congested today.  Use Afrin 2 sprays each nostril and wait 5-15 minutes or until you can breathe more freely thru your nose then follow-up with your medicated nose sprays.   Continue nasal saline rinses  Continue with nasal Atrovent 2 sprays each nostril up to 3-4 times a day as needed for nasal congestion and drainage  Continue  nasal Asteline 2 sprays each nostril twice a day for nasal drainage  Use Xhance nasal spray 3 days a week (M/W/F) to help with congestion   Stop Xyzal  Try Ryclora take 55ml every 4-6 hours as needed.  This can replace Xyzal if more effective  After a week on the regimen above you can try stopping Singulair for a week and see if you can tell any differences.  If symptoms worsen then resume singulair if noticing no changes then remain off Singulair.    If you are having acute worsening of symptoms, please go to ER/urgent care.  Follow up in 3-4 months or sooner if needed

## 2019-12-24 NOTE — Progress Notes (Signed)
Follow-up Note  RE: Jordan [name] MRN: [id number] DOB: Oct 09, 1975 Date of Office Visit: 12/24/2019   History of present illness: Jordan [name] is a 44 y.o. male presenting today for follow-up of allergies and breathing difficulties.  He was last seen in the office on 08/25/19 by Dr. Dellis Anes.  He states he is doing the same as he was at his last visit.  He states his sinus drainage is terrible and he is having headaches and feeling pressure in his cheeks and ears.  He is constantly throat clearing and trying to clear out drainage.  He is doing Xyzal daily as well as Singulair daily.  He uses the Astelin around 10:30 in the morning and then will use the Atrovent nasal spray 1 11:30 in the morning and will do of the second doses of these in the evening before bed.  He states he performs nasal saline rinses before you are using the nasal sprays.  He currently is not using the [facility] as he believes it was causing nosebleeds when he was using it daily but he does not really remember why he is not using it anymore.  He states he still feels quite congested.  He has had a course of antibiotic this summer for similar symptoms.  In regards to his breathing he states he still has coughing and shortness of breath that can occur even if he is just laying down.  He states doing any type of activity flares it up especially doing things and involves bending.  He uses his brush tree around 10:30 in the morning after waking up and then again around 630 to 8:30 in the evening.  He will use his Qvar around 1230 and then again in the evening.  He states about an hour or 2 later he will have symptoms then will then use the albuterol either inhaler or the nebulizer which she states he has been using about every 3 hours.  He does report getting temporary relief but symptoms return.  We do have him on Nucala.  He has had 2 doses thus far in a set to get his third dose today.  He states he may get some benefit several days after the  new [place] dose and then his symptoms start to round back up again. He did see Dr. Elijah Birk at [facility] who ordered a CT of his sinus and chest.  The CT sinus did show some mucosal changes the chest CT was unremarkable.  He did referred him to see ENT at [facility] and there VCD department.  Per the ENT note it does not appear that Homero Fellers paradoxical vocal cord movement was identified.  However the speech therapy note does report that he would benefit from speech therapy and recommended he completes several sessions with them.  He states that he recalls doing a lot of vocal maneuvers during his initial visit.  However he states that he was told he should stop all of his inhaler medications which she was not comfortable with.  Thus he does not want to continue further care there and he also states the traveling is bit of an issue. He states he does not have a follow-up Dr. Elijah Birk will schedule yet.  Per Dr. Ledell Peoples on note he also was recommending a methacholine challenge which I also agree with.  Review of systems: Review of Systems  Constitutional: Negative.   HENT: Positive for congestion and ear pain.   Eyes: Negative.   Respiratory: Positive for cough  and shortness of breath.   Cardiovascular: Negative.   Gastrointestinal: Negative.   Musculoskeletal: Negative.   Skin: Negative.   Neurological: Negative.     All other systems negative unless noted above in HPI  Past medical/social/surgical/family history have been reviewed and are unchanged unless specifically indicated below.  No changes  Medication List: Current Outpatient Medications  Medication Sig Dispense Refill   albuterol (PROAIR HFA) 108 (90 Base) MCG/ACT inhaler Inhale 1-2 puffs into the lungs every 6 (six) hours as needed for wheezing or shortness of breath. 18 g 1   amLODipine (NORVASC) 5 MG tablet Take 1 tablet (5 mg total) by mouth daily. 90 tablet 0   azelastine (ASTELIN) 0.1 % nasal spray 2 sprays per nostril  2 times daily as needed for drainage. 30 mL 5   azithromycin (ZITHROMAX Z-PAK) 250 MG tablet Take 2 tabs Day 1, then 1 tab day 2-5 6 each 0   beclomethasone (QVAR REDIHALER) 80 MCG/ACT inhaler Inhale 2 puffs into the lungs 2 (two) times daily. Rinse mouth out after each use. 21.2 g 3   Budeson-Glycopyrrol-Formoterol (BREZTRI AEROSPHERE) 160-9-4.8 MCG/ACT AERO Inhale 2 puffs into the lungs in the morning and at bedtime. 32.1 g 3   EPINEPHrine (EPIPEN 2-PAK) 0.3 mg/0.3 mL IJ SOAJ injection INJECT 0.3 MLS INTO MUSCLE     ibuprofen (ADVIL) 800 MG tablet Take 1 tablet (800 mg total) by mouth 3 (three) times daily. 21 tablet 0   ipratropium (ATROVENT) 0.06 % nasal spray USE 2 SPRAYS IN EACH NOSTRIL THREE TIMES DAILY     ipratropium-albuterol (DUONEB) 0.5-2.5 (3) MG/3ML SOLN Take 3 mLs by nebulization every 6 (six) hours as needed (wheezing, shortness of breath, chest tightness). 360 mL 0   montelukast (SINGULAIR) 10 MG tablet Take 1 tablet (10 mg total) by mouth at bedtime. 90 tablet 0   naproxen (NAPROSYN) 500 MG tablet Take 1 tablet (500 mg total) by mouth 2 (two) times daily with a meal. 60 tablet 2   ondansetron (ZOFRAN) 8 MG tablet Take 1 tablet (8 mg total) by mouth every 8 (eight) hours as needed for nausea or vomiting. 20 tablet 0   pantoprazole (PROTONIX) 20 MG tablet Take 1 tablet (20 mg total) by mouth daily. 90 tablet 0   Spacer/Aero-Hold Chamber Mask MISC 1 each by Other route 3 times/day as needed-between meals & bedtime. 1 each 0   Dexchlorpheniramine Maleate (RYCLORA) 2 MG/5ML SOLN Take 5 ml every 4-6 hours as needed 1800 mL 1   EPIPEN 2-PAK 0.3 MG/0.3ML SOAJ injection INJECT 0.3 MLS INTO MUSCLE     Fluticasone-Umeclidin-Vilant (TRELEGY ELLIPTA) 200-62.5-25 MCG/INH AEPB Inhale 1 puff into the lungs daily. (Patient not taking: Reported on 12/24/2019) 180 each 1   ipratropium (ATROVENT) 0.06 % nasal spray Place 2 sprays into both nostrils 3 (three) times daily.     Current  Facility-Administered Medications  Medication Dose Route Frequency Provider Last Rate Last Admin   Mepolizumab SOLR 100 mg  100 mg Subcutaneous Q28 days Marcelyn Bruins, MD   100 mg at 12/24/19 1545     Known medication allergies: No Known Allergies   Physical examination: Blood pressure (!) 130/86, pulse 97, temperature 98.8 F (37.1 C), resp. rate 16, height 5\' 9"  (1.753 m), weight 177 lb 12.8 oz (80.6 kg), SpO2 93 %.  General: Alert, interactive, in no acute distress. HEENT: PERRLA, TMs pearly gray, turbinates moderately edematous with clear discharge, post-pharynx non erythematous. Neck: Supple without lymphadenopathy. Lungs: Clear to auscultation without  wheezing, rhonchi or rales. {no increased work of breathing. CV: Normal S1, S2 without murmurs. Abdomen: Nondistended, nontender. Skin: Warm and dry, without lesions or rashes. Extremities:  No clubbing, cyanosis or edema. Neuro:   Grossly intact.  Diagnositics/Labs: Labs:  Component     Latest Ref Rng & Units 08/07/2019 08/25/2019  WBC     3.4 - 10.8 x10E3/uL 4.2   RBC     4.14 - 5.80 x10E6/uL 4.60   Hemoglobin     13.0 - 17.7 g/dL 12.7   HCT     51.7 - 00.1 % 41.4   MCV     79 - 97 fL 90   MCH     26.6 - 33.0 pg 29.8   MCHC     31 - 35 g/dL 74.9   RDW     44.9 - 67.5 % 14.0   Platelets     150 - 450 x10E3/uL 256   Neutrophils     Not Estab. % 51   Lymphs     Not Estab. % 39   Monocytes     Not Estab. % 7   Eos     Not Estab. % 2   Basos     Not Estab. % 1   NEUT#     1 - 7 x10E3/uL 2.1   Lymphocyte #     0 - 3 x10E3/uL 1.6   Monocytes Absolute     0 - 0 x10E3/uL 0.3   EOS (ABSOLUTE)     0.0 - 0.4 x10E3/uL 0.1   Basophils Absolute     0 - 0 x10E3/uL 0.0   Immature Granulocytes     Not Estab. % 0   Immature Grans (Abs)     0.0 - 0.1 x10E3/uL 0.0   A.Fumigatus #1 Abs     Negative Negative   Aspergillus Flavus Antibodies     Negative Negative   Aspergillus [place] Antibodies      Negative Negative   Aspergillus nidulans IgG     Negative Negative   Aspergillus glaucus IgG     Negative Negative   Aspergillus terreus IgG     Negative Negative   AAT, DNA Analysis      Comment   Additional Information      Comment   Electronically Signed by:      Comment   Myeloperoxidase Ab     0.0 - 9.0 U/mL <9.0   ANCA Proteinase 3     0.0 - 3.5 U/mL <3.5   ANA Titer 1       Negative  Please Note (HCV):       Comment  Sed Rate     0 - 15 mm/hr  32 (H)  CRP     0 - 10 mg/L  <1  Anti Nuclear Antibody (ANA)     Negative  Negative  RA Latex Turbid.     0.0 - 13.9 IU/mL  13.2  Angio Convert Enzyme     14 - 82 U/L  30    Spirometry: FEV1: 2.97L 88%, FVC: 3.67L 88%, ratio consistent with Nonobstructive pattern  Assessment and plan: Patient Instructions  Asthma:   Lung testing looks great today!  Daily controller medication(s):  o Continue Breztri 2 puffs twice a day and Qvar 2 puffs twice a day with spacer and rinse mouth afterwards. o During flare ups can use Qvar 3 times a day until symptoms o Continue Singulair 10mg  daily at night.   Prior  to physical activity: May use albuterol rescue inhaler 2 puffs 5 to 15 minutes prior to strenuous physical activities.  Rescue medications: May use albuterol rescue inhaler 2 puffs or nebulizer every 4 to 6 hours as needed for shortness of breath, chest tightness, coughing, and wheezing. Monitor frequency of use.  Continue Nucala injections for now (provided today)  Continue follow-up with Dr. Elijah Birkaldwell who recommended performing methacholine challenge.   Recommend getting this scheduled with her to classify better your breathing issues.  Also recommend continuing speech therapy for breath re-training.  We can identify speech therapist with [facility] who may be able to implement therapy [facility].    Control goals:  o Full participation in all desired activities (may need albuterol before  activity) o Albuterol use two times or less a week on average (not counting use with activity) o Cough interfering with sleep two times or less a month o Oral steroids no more than once a year o No hospitalization  Heartburn  Continue pantoprazole 20mg  in the morning. Nothing to eat or drink for 30 minutes.  Seasonal and perennial allergic rhinoconjunctivitis Past history - 2021 skin testing was positive to grass, tree, mold, dust mites.   Nose very congested today.  Use Afrin 2 sprays each nostril and wait 5-15 minutes or until you can breathe more freely thru your nose then follow-up with your medicated nose sprays.   Continue nasal saline rinses  Continue with nasal Atrovent 2 sprays each nostril up to 3-4 times a day as needed for nasal congestion and drainage  Continue nasal Asteline 2 sprays each nostril twice a day for nasal drainage  Use Xhance nasal spray 3 days a week (M/W/F) to help with congestion   Stop Xyzal  Try Ryclora take 5ml every 4-6 hours as needed.  This can replace Xyzal if more effective  After a week on the regimen above you can try stopping Singulair for a week and see if you can tell any differences.  If symptoms worsen then resume singulair if noticing no changes then remain off Singulair.    If you are having acute worsening of symptoms, please go to ER/urgent care.  Follow up in 3-4 months or sooner if needed  I appreciate the opportunity to take part in Ammon's care. Please do not hesitate to contact me with questions.  Sincerely,   Margo AyeShaylar Abagale Boulos, MD Allergy/Immunology [facility]

## 2019-12-28 ENCOUNTER — Telehealth: Payer: Self-pay

## 2019-12-28 NOTE — Telephone Encounter (Signed)
Called and spoke with patient regarding Ryclora.  Will get more samples for patient since he feels it is helping and he states pharmacists told him they would not have it available until September.  Informed patient I would contact the pharmaceutical representative for our office regarding getting more samples in and clarification on availability at pharmacy.  Encouraged patient to do follow up with Dr. Elijah Birk as advised per Dr. Elijah Birk.

## 2019-12-28 NOTE — Telephone Encounter (Signed)
Patient called requesting to speak to [facility].  Please Advise.

## 2020-01-01 NOTE — Telephone Encounter (Signed)
[place] paperwork has been completed and faxed. Called patient and left a voicemail asking to return call to inform. Paperwork has been placed up front with Ryclora samples to be picked up.

## 2020-01-04 NOTE — Telephone Encounter (Signed)
Patient called back and was informed regarding the [place] forms and Ryclora samples.

## 2020-01-06 ENCOUNTER — Encounter: Payer: BC Managed Care – PPO | Admitting: Nurse Practitioner

## 2020-01-07 ENCOUNTER — Telehealth: Payer: Self-pay | Admitting: Allergy

## 2020-01-07 NOTE — Telephone Encounter (Signed)
Called and spoke with patient.   Patient stated he has had a flare with his asthma yesterday and this morning. He complains of chest tightness which he states is like he has been having in the past.  Reviewed medications with patient.  Patient states he is taking Ryclora 5 ml every 4 hours, he added Xyzal yesterday morning and this morning, Omeprazole bid, Xhance Monday,Wednesday and Friday, Saline spray, Astelin, Qvar 80 mcg, Breztri and Albuterol.  Patient states he was using his inhalers back to back and at times,  only 30 minutes in between before using Albuterol again.  Reviewed with patient in great detail which inhaler to use and what time.  Instructed patient again that Qvar is only added as a third dose during flare up. Patient has not called Dr. Elijah Birk at this time.  Stressed to patient again the importance of his follow up with Dr. Elijah Birk and getting Methacholine challenge done. Patient voiced understanding and stated he would call Dr. Ledell Peoples office. Informed patient I would let Dr. Delorse Lek know he had a flare up and call him back.

## 2020-01-07 NOTE — Telephone Encounter (Signed)
Patient called and said that he was having a flare up and would like to talk with someone. [phone].

## 2020-01-11 ENCOUNTER — Telehealth: Payer: Self-pay | Admitting: *Deleted

## 2020-01-11 NOTE — Telephone Encounter (Signed)
Patient called stating he was unable to get in contact with Dr Jerelyn Artavious Office.

## 2020-01-11 NOTE — Telephone Encounter (Addendum)
Dr. Delorse Lek was given update on patient on 01/07/20 and recent flare and how he was taking medication.  Informed that reviewing of how and when to take each medication was reviewed with patient in great detail.   Per Dr. Delorse Lek, no change to medications at this time per her office visit on 12/24/2019.  Patient needs to do follow up with Dr. Elijah Birk and Methacholine challenge.    Called patient back on 01/07/20 and he called Dr. Ledell Peoples office, but had to leave a message for them to call him to schedule appointment.  Patient informed on recommendation per Dr. Delorse Lek.  Patient voiced understanding.

## 2020-01-11 NOTE — Telephone Encounter (Signed)
Called and left message for patient to call the office.  Need to know if he has received a call back from Dr. Ledell Peoples office regarding follow up appointment and Methacholine challenge.

## 2020-01-14 NOTE — Telephone Encounter (Signed)
Called patient and informed him Dr. Ledell Peoples office had returned his call from last week and he missed the call.  Gave patient the phone number for Dr. Elijah Birk and he stated he would call the office this afternoon.  Dr. Delorse Lek notified.

## 2020-01-14 NOTE — Telephone Encounter (Signed)
Dr. Delorse Lek notified patient has not made appointment with Dr. Elijah Birk.

## 2020-01-21 ENCOUNTER — Ambulatory Visit: Payer: Self-pay

## 2020-01-21 NOTE — Telephone Encounter (Signed)
Called and spoke with patient.  Patient states Attending Physicians Statement filled out previously by Dr. Delorse Lek is being faxed to the office by [facility].  Per patient, he was informed that the date for when he started for out of work needed to be changed to 06/25/2019 from 07/27/2019.  Patient is going from short term disability to long term disability.  Informed patient I will be looking for papers to be faxed. Patient did confirm that he did speak with Dr. Ledell Peoples office and had Methacholine challenge explained.  Patient is waiting for appointment date and time.   Dr. Delorse Lek notified.

## 2020-01-21 NOTE — Telephone Encounter (Signed)
Patient called asking to speak to Dr. Delorse Lek or Waynetta Sandy. Patient has important information to give Dr. Delorse Lek asap.

## 2020-01-22 ENCOUNTER — Telehealth: Payer: Self-pay | Admitting: Allergy

## 2020-01-22 NOTE — Telephone Encounter (Signed)
Forms received from [facility] and will discuss with Dr. Delorse Lek.

## 2020-01-22 NOTE — Telephone Encounter (Signed)
Patient called and said he refaxed papers to Dr. Delorse Lek and would like to know if she got them.

## 2020-01-22 NOTE — Telephone Encounter (Signed)
Called and spoke with patient.  Patient informed that [facility] papers were received and Dr. Delorse Lek has them to review regarding the request for change of out of work start date to 06/25/19 for Long Term Disability.  Patient voiced understanding.

## 2020-01-22 NOTE — Telephone Encounter (Signed)
Patient called and said he refaxed papers to Dr. Padgett and would like to know if she got them. 

## 2020-01-22 NOTE — Telephone Encounter (Signed)
Added this on to other message.

## 2020-01-25 ENCOUNTER — Telehealth: Payer: Self-pay | Admitting: Allergy

## 2020-01-25 NOTE — Telephone Encounter (Signed)
Patient called back and needed a nurse to call him back. Patient did not specify a reason.

## 2020-01-25 NOTE — Telephone Encounter (Signed)
Patient called the office earlier today and asked the nurses to call back but he states that nobody did.  He has been having some chest congestion.and nasal congestion with headaches. When prompted what is his worst symptoms he says it's all of it.   It was so bad earlier that it made him dizzy.  Denies any fevers or chills. No sick contacts.   He took his Ryclora, Qvar, nebulizer, Timmothy Sours which is not helping.   Pulse ox was about 95% last time he checked which seems to be his normal.   Patient did not use Astelin or saline spray as he was told not to use all these nasal sprays.  I advised him to use the saline nasal spray first, then blow out his nose. Then use astelin nasal spray to see if it helps with his sinus symptoms.   He took his nebulizer around 7:30PM so I told him to take another nebulizer treatment before going to bed.  Patient agreeable to the plan.

## 2020-01-25 NOTE — Telephone Encounter (Signed)
Patient called and wanted to speak with [facility], informed she was out of the office for the day. Patient wanted to give Beth a direct number he has for [employer]. [phone] is a direct number for [facility], who is working on his information at [employer].  Please advise.

## 2020-01-26 NOTE — Telephone Encounter (Signed)
Patient is reporting severe chest congestion, nasal congestion and head congestion. I did advise on medications from 11/2019 OV. Do you have any other recommendations.

## 2020-01-26 NOTE — Telephone Encounter (Signed)
Called and informed patient to take the Mucinex DM or D and patient expressed understanding.

## 2020-01-26 NOTE — Telephone Encounter (Signed)
Unable to leave message

## 2020-01-26 NOTE — Telephone Encounter (Signed)
If he is not currently taking Mucinex DM or D he could try one of those to help with the congestion.   He always reports taking his medications appropriately and performing nasal rinses as well.  I am not sure why he has so much continued symptoms.  If his symptoms have worsened then he should likely perform a Covid test just to ensure symptoms are not due to that.   He has an upcoming appt with Dr. Elijah Birk (allergist at [facility] for a methacholine challenge).

## 2020-01-26 NOTE — Telephone Encounter (Signed)
Patient had called back but none of the nurses were aware at the time that someone had contacted him this morning. Called the patient and asked for him to call back this afternoon at 1:30 once we open back up from lunch.

## 2020-01-28 ENCOUNTER — Telehealth: Payer: Self-pay | Admitting: *Deleted

## 2020-01-28 NOTE — Telephone Encounter (Signed)
Patient called and asked for a letter that was written back in June to be sent to [employer] at [phone] via fax to help him extend his court date. Letter has been reprinted and signed for today and has been faxed with Dr. Randell Patient consent. Patient is aware and verbalized understanding. Form has been faxed.

## 2020-01-29 ENCOUNTER — Telehealth: Payer: Self-pay

## 2020-02-02 ENCOUNTER — Telehealth: Payer: Self-pay | Admitting: Allergy

## 2020-02-02 ENCOUNTER — Ambulatory Visit: Payer: Self-pay

## 2020-02-02 MED ORDER — AZITHROMYCIN 250 MG PO TABS: ORAL_TABLET | ORAL | 0 refills | Status: DC

## 2020-02-02 NOTE — Telephone Encounter (Signed)
I called patient back and he states he has been having sinus pressure and a lot of nasal congestion. He also complains of a headache mainly in the right side. He would like something called in for him. Please advice.

## 2020-02-02 NOTE — Telephone Encounter (Signed)
Called and spoke with patient.  Informed patient of recommendation per Dr. Delorse Lek regarding Azithromycin and what to do if symptoms persist after taking antibiotic.   RX sent to [facility]. Patient verbalized understanding.    Per patient he has not been given actually appointment date for Methacholine challenge.  He did speak with Dr. Ledell Peoples office and had all his questions answered regarding Methacholine challenge and he is ready to get it scheduled.  He is waiting for call back after their referral was placed.  Patient also states that [facility] with [facility] informed him that office notes/progress notes were needed from 12/08/19 to present.  Attending Physician Statement last completed by Dr. Delorse Lek with date out of work  starting 06/25/19 was received and [employer] has faxed request for office/progress notes.  Informed patient we will be looking for request for notes.  We received fax from [facility] on 02/03/20 and will send requested notes.  Dr. Delorse Lek notified.

## 2020-02-02 NOTE — Telephone Encounter (Signed)
Patient called and needs to talk  with a nurse about the disability form that was sent to his ins co.

## 2020-02-02 NOTE — Telephone Encounter (Signed)
He may benefit from azithromycin 500mg  day1, then 250mg  day 2-5.  This has anti-inflammatory properties as well which may help with congestion.  If symptoms persist then will need to request sinus culture to be performed by ENT to determine appropriate more targeted antibiotic if symptoms are bacterial in nature and not improved with azithromycin.    Also can you ask when he is scheduled for the methacholine challenge with Dr. ?

## 2020-02-02 NOTE — Telephone Encounter (Signed)
Patient called and said that he need something for a sinus infection. Anabiotic would help. [facility] [address] [phone].

## 2020-02-03 NOTE — Telephone Encounter (Signed)
Received fax from [employer] asking for most recent office notes after 12/08/2019. The most most recent office visit after 12/08/2019 was on 12/24/2019. The has been faxed to [facility] to [phone]. Request forms have been labeled and placed in bulk scanning. Called patient and left a detailed voicemail per DPR permission advising that the most recent office notes have been faxed.

## 2020-02-04 ENCOUNTER — Encounter: Payer: BC Managed Care – PPO | Admitting: Nurse Practitioner

## 2020-02-08 ENCOUNTER — Other Ambulatory Visit: Payer: Self-pay

## 2020-02-08 ENCOUNTER — Telehealth: Payer: Self-pay | Admitting: Allergy

## 2020-02-08 MED ORDER — ONDANSETRON HCL 8 MG PO TABS: 8.0000 mg | ORAL_TABLET | Freq: Three times a day (TID) | ORAL | 0 refills | Status: DC | PRN

## 2020-02-08 NOTE — Telephone Encounter (Signed)
Patient needed refills on his Breztri and zofran. I informed patient a refill will be sent in. I did see in his chart after hanging up that the [facility] was sent in as a 90 day supply with 3 additional refills back in July so, I called the pharmacy and they did have the refill on file for him and will get it filled for him today.

## 2020-02-08 NOTE — Telephone Encounter (Signed)
Patient called wanting to discuss his prescriptions.

## 2020-02-09 ENCOUNTER — Ambulatory Visit (INDEPENDENT_AMBULATORY_CARE_PROVIDER_SITE_OTHER): Payer: BC Managed Care – PPO | Admitting: *Deleted

## 2020-02-09 ENCOUNTER — Telehealth: Payer: Self-pay | Admitting: *Deleted

## 2020-02-09 ENCOUNTER — Other Ambulatory Visit: Payer: Self-pay

## 2020-02-09 DIAGNOSIS — J4550 Severe persistent asthma, uncomplicated: Secondary | ICD-10-CM | POA: Diagnosis not present

## 2020-02-09 DIAGNOSIS — J4540 Moderate persistent asthma, uncomplicated: Secondary | ICD-10-CM

## 2020-02-09 NOTE — Telephone Encounter (Signed)
Called patient to inform him that when he comes for his Nucala injection today that we have Ryclora samples for him to pick up.  Samples will be in the injection room. Followed up on if patient had Methacholine Challenge scheduled and he has not received call back after referral was placed.  Gave patient number for Dr. Ledell Peoples office to follow up on appointment.  Patient voiced understanding and will be at the office at 5:30 pm today for his Nucala injection.

## 2020-02-13 ENCOUNTER — Encounter: Payer: Self-pay | Admitting: Allergy & Immunology

## 2020-02-13 NOTE — Progress Notes (Signed)
Jordan [name] called me to report that he had a short episode of vertigo. He had just gotten up and was walking to the kitchen. He had to catch himself in the door frame to keep from falling over. He sat down and recovered quickly. He had not taken his Breztri at that point and had not used any albuterol at that point. I also took his montelukast AFTER the episode of dizziness. He had taken Zofran for nausea earlier this morning.   He has not had dizziness recently. He had not taken in a lot of fluids today yet. He thinks that it might be related to zofran since he has been taking this so frequently. I recommended that he go ahead and stop the zofran. I also recommended adding on meclizine 25mg  every 8 hours as needed. Confirmed pharmacy with the patient.  I will let Dr. know that he called.   Delorse Lek, MD

## 2020-02-16 ENCOUNTER — Telehealth: Payer: Self-pay

## 2020-02-16 NOTE — Telephone Encounter (Signed)
Spoke to patient and he states he is still feeling a bit dizzy when he turns his head and wanted to know how long he should take the meclizine. I informed him per Dr. Ellouise Newer instructions to take it every 8hrs as needed and to give it a few more days and if no improvement to give [place] a call back.

## 2020-02-16 NOTE — Telephone Encounter (Signed)
Patient called to see if he can have a nurse give him a call back. Patient did not go into detail.  Please Advise

## 2020-02-17 NOTE — Telephone Encounter (Signed)
If his dizziness continues he will need to follow-up back up with ENT to discuss.  Dizziness/vertigo may not be related to allergies at all.

## 2020-02-17 NOTE — Telephone Encounter (Signed)
Yes I would give it more time.  I am also rounding to Dr. Delorse Lek.  He has made it clear that he does not want to work with the male members of the staff, so I am honoring that and rounding to his primary allergist.  Malachi Bonds, MD [facility]

## 2020-02-19 NOTE — Telephone Encounter (Signed)
Patient called back asking for a letter to be faxed on October 1st to continue his court date. I advised that I can fax the same letter I faxed on September 2nd on October 1st. Patient stated that was fine and verbalized understanding. He stated that he got an appointment reminder from a Dr. Delford Field and he did not know who that was. He also stated that his vertigo was still bad and he didn't think the medication was helping. I spoke with Dr. Delorse Lek and advised the patient that Dr. Delford Field is his ENT doctor and that he should call his office and explain that his vertigo is worsening to see if they can get him in sooner to be seen since Dr. Delorse Lek does not think that his vertigo is caused by his allergies. Patient verbalized understanding and will do so. I have made a note to fax the letter next Friday October 1st.

## 2020-02-26 ENCOUNTER — Other Ambulatory Visit: Payer: Self-pay | Admitting: Allergy

## 2020-02-26 ENCOUNTER — Other Ambulatory Visit: Payer: Self-pay | Admitting: Nurse Practitioner

## 2020-02-26 DIAGNOSIS — I10 Essential (primary) hypertension: Secondary | ICD-10-CM

## 2020-02-26 NOTE — Telephone Encounter (Signed)
Yea that is fine to refill just one more time.

## 2020-02-26 NOTE — Telephone Encounter (Signed)
Patient called back and said he does not need the letter until November 5. But, he needs it dated 5 days before that.

## 2020-02-26 NOTE — Telephone Encounter (Signed)
Please advise on if this medication should be refilled or not.

## 2020-02-29 MED ORDER — AMLODIPINE BESYLATE 5 MG PO TABS: 5.0000 mg | ORAL_TABLET | Freq: Every day | ORAL | 0 refills | Status: DC

## 2020-02-29 NOTE — Addendum Note (Signed)
Addended by: Alysia Penna L on: 02/29/2020 01:47 PM   Modules accepted: Orders

## 2020-03-03 ENCOUNTER — Telehealth: Payer: Self-pay | Admitting: Allergy

## 2020-03-03 NOTE — Telephone Encounter (Signed)
Patient would like a nurse to call him back regarding asthma flares the past 2-3 days. Patient states he was wheezing pretty bad last night.  Please advise.

## 2020-03-03 NOTE — Telephone Encounter (Signed)
Left message for patient to call back  

## 2020-03-04 NOTE — Telephone Encounter (Signed)
I called patient to confirm how he was taking his Qvar when he is having a flare up. Upon talking to patient it was clarified that he was taking it incorrectly. We verbally discussed the correct way of taking his medication and he verbally agreed to do so and acknowledged understanding. I advised him to call back if the correct method was not helpful.

## 2020-03-04 NOTE — Telephone Encounter (Signed)
Patient states that it feels "heavy to breathe," and its hard for him take a deep breath. He has been using all of his prescriptions as prescribed. Please advise.

## 2020-03-04 NOTE — Telephone Encounter (Signed)
Did he increase to the Qvar to 3 puffs 3 times a day when he is flared?  Would start with this.   If he has done this (in addition to his normal breztri doses) then we can have him do a short prednisone burst - the equivalent of a Friday pack.

## 2020-03-04 NOTE — Telephone Encounter (Signed)
Patient called back and would like to talk to [facility] specifically.

## 2020-03-08 ENCOUNTER — Ambulatory Visit: Payer: Self-pay

## 2020-03-09 ENCOUNTER — Encounter: Payer: Self-pay | Admitting: Nurse Practitioner

## 2020-03-09 ENCOUNTER — Other Ambulatory Visit: Payer: Self-pay

## 2020-03-09 ENCOUNTER — Ambulatory Visit (INDEPENDENT_AMBULATORY_CARE_PROVIDER_SITE_OTHER): Payer: BC Managed Care – PPO | Admitting: Nurse Practitioner

## 2020-03-09 VITALS — BP 124/80 | HR 84 | Temp 97.8°F | Ht 68.75 in | Wt 177.8 lb

## 2020-03-09 DIAGNOSIS — R12 Heartburn: Secondary | ICD-10-CM

## 2020-03-09 DIAGNOSIS — Z0001 Encounter for general adult medical examination with abnormal findings: Secondary | ICD-10-CM | POA: Diagnosis not present

## 2020-03-09 DIAGNOSIS — E7800 Pure hypercholesterolemia, unspecified: Secondary | ICD-10-CM | POA: Diagnosis not present

## 2020-03-09 DIAGNOSIS — I10 Essential (primary) hypertension: Secondary | ICD-10-CM | POA: Diagnosis not present

## 2020-03-09 DIAGNOSIS — Z23 Encounter for immunization: Secondary | ICD-10-CM | POA: Diagnosis not present

## 2020-03-09 DIAGNOSIS — E781 Pure hyperglyceridemia: Secondary | ICD-10-CM | POA: Diagnosis not present

## 2020-03-09 DIAGNOSIS — Z0000 Encounter for general adult medical examination without abnormal findings: Secondary | ICD-10-CM

## 2020-03-09 LAB — LIPID PANEL
Cholesterol: 241 mg/dL — ABNORMAL HIGH (ref 0–200)
HDL: 62.4 mg/dL (ref >39.00)
NonHDL: 178.27
Total CHOL/HDL Ratio: 4
Triglycerides: 290 mg/dL — ABNORMAL HIGH (ref 0.0–149.0)
VLDL: 58 mg/dL — ABNORMAL HIGH (ref 0.0–40.0)

## 2020-03-09 LAB — COMPREHENSIVE METABOLIC PANEL
ALT: 34 U/L (ref 0–53)
AST: 30 U/L (ref 0–37)
Albumin: 4.7 g/dL (ref 3.5–5.2)
Alkaline Phosphatase: 60 U/L (ref 39–117)
BUN: 13 mg/dL (ref 6–23)
CO2: 29 mEq/L (ref 19–32)
Calcium: 9.5 mg/dL (ref 8.4–10.5)
Chloride: 101 mEq/L (ref 96–112)
Creatinine, Ser: 0.98 mg/dL (ref 0.40–1.50)
GFR: 93.42 mL/min (ref >60.00)
Glucose, Bld: 90 mg/dL (ref 70–99)
Potassium: 4.3 mEq/L (ref 3.5–5.1)
Sodium: 138 mEq/L (ref 135–145)
Total Bilirubin: 0.5 mg/dL (ref 0.2–1.2)
Total Protein: 7.8 g/dL (ref 6.0–8.3)

## 2020-03-09 LAB — LDL CHOLESTEROL, DIRECT: Direct LDL: 113 mg/dL

## 2020-03-09 MED ORDER — PANTOPRAZOLE SODIUM 20 MG PO TBEC: 20.0000 mg | DELAYED_RELEASE_TABLET | Freq: Every day | ORAL | 1 refills | Status: DC

## 2020-03-09 MED ORDER — AMLODIPINE BESYLATE 5 MG PO TABS: 5.0000 mg | ORAL_TABLET | Freq: Every day | ORAL | 3 refills | Status: DC

## 2020-03-09 NOTE — Assessment & Plan Note (Signed)
BP at goal with amlodipine BP Readings from Last 3 Encounters:  03/09/20 124/80  12/24/19 (!) 130/86  10/24/19 (!) 143/98   Continue current medication F/up in 68months

## 2020-03-09 NOTE — Patient Instructions (Signed)
Go to lab for blood draw Continue appts with allergist.   Health Maintenance, Male Adopting a healthy lifestyle and getting preventive care are important in promoting health and wellness. Ask your health care provider about:  The right schedule for you to have regular tests and exams.  Things you can do on your own to prevent diseases and keep yourself healthy. What should I know about diet, weight, and exercise? Eat a healthy diet   Eat a diet that includes plenty of vegetables, fruits, low-fat dairy products, and lean protein.  Do not eat a lot of foods that are high in solid fats, added sugars, or sodium. Maintain a healthy weight Body mass index (BMI) is a measurement that can be used to identify possible weight problems. It estimates body fat based on height and weight. Your health care provider can help determine your BMI and help you achieve or maintain a healthy weight. Get regular exercise Get regular exercise. This is one of the most important things you can do for your health. Most adults should:  Exercise for at least 150 minutes each week. The exercise should increase your heart rate and make you sweat (moderate-intensity exercise).  Do strengthening exercises at least twice a week. This is in addition to the moderate-intensity exercise.  Spend less time sitting. Even light physical activity can be beneficial. Watch cholesterol and blood lipids Have your blood tested for lipids and cholesterol at 44 years of age, then have this test every 5 years. You may need to have your cholesterol levels checked more often if:  Your lipid or cholesterol levels are high.  You are older than 44 years of age.  You are at high risk for heart disease. What should I know about cancer screening? Many types of cancers can be detected early and may often be prevented. Depending on your health history and family history, you may need to have cancer screening at various ages. This may  include screening for:  Colorectal cancer.  Prostate cancer.  Skin cancer.  Lung cancer. What should I know about heart disease, diabetes, and high blood pressure? Blood pressure and heart disease  High blood pressure causes heart disease and increases the risk of stroke. This is more likely to develop in people who have high blood pressure readings, are of African descent, or are overweight.  Talk with your health care provider about your target blood pressure readings.  Have your blood pressure checked: ? Every 3-5 years if you are 27-66 years of age. ? Every year if you are 45 years old or older.  If you are between the ages of 15 and 9 and are a current or former smoker, ask your health care provider if you should have a one-time screening for abdominal aortic aneurysm (AAA). Diabetes Have regular diabetes screenings. This checks your fasting blood sugar level. Have the screening done:  Once every three years after age 58 if you are at a normal weight and have a low risk for diabetes.  More often and at a younger age if you are overweight or have a high risk for diabetes. What should I know about preventing infection? Hepatitis B If you have a higher risk for hepatitis B, you should be screened for this virus. Talk with your health care provider to find out if you are at risk for hepatitis B infection. Hepatitis C Blood testing is recommended for:  Everyone born from [birth year] through 1965.  Anyone with known risk factors for hepatitis  C. Sexually transmitted infections (STIs)  You should be screened each year for STIs, including gonorrhea and chlamydia, if: ? You are sexually active and are younger than 44 years of age. ? You are older than 44 years of age and your health care provider tells you that you are at risk for this type of infection. ? Your sexual activity has changed since you were last screened, and you are at increased risk for chlamydia or gonorrhea. Ask your  health care provider if you are at risk.  Ask your health care provider about whether you are at high risk for HIV. Your health care provider may recommend a prescription medicine to help prevent HIV infection. If you choose to take medicine to prevent HIV, you should first get tested for HIV. You should then be tested every 3 months for as long as you are taking the medicine. Follow these instructions at home: Lifestyle  Do not use any products that contain nicotine or tobacco, such as cigarettes, e-cigarettes, and chewing tobacco. If you need help quitting, ask your health care provider.  Do not use street drugs.  Do not share needles.  Ask your health care provider for help if you need support or information about quitting drugs. Alcohol use  Do not drink alcohol if your health care provider tells you not to drink.  If you drink alcohol: ? Limit how much you have to 0-2 drinks a day. ? Be aware of how much alcohol is in your drink. In the [place] one drink equals one 12 oz bottle of beer (355 mL), one 5 oz glass of wine (148 mL), or one 1 oz glass of hard liquor (44 mL). General instructions  Schedule regular health, dental, and eye exams.  Stay current with your vaccines.  Tell your health care provider if: ? You often feel depressed. ? You have ever been abused or do not feel safe at home. Summary  Adopting a healthy lifestyle and getting preventive care are important in promoting health and wellness.  Follow your health care provider's instructions about healthy diet, exercising, and getting tested or screened for diseases.  Follow your health care provider's instructions on monitoring your cholesterol and blood pressure. This information is not intended to replace advice given to you by your health care provider. Make sure you discuss any questions you have with your health care provider. Document Revised: 05/07/2018 Document Reviewed: 05/07/2018 Elsevier Patient Education   2020 [employer].

## 2020-03-09 NOTE — Progress Notes (Signed)
Subjective:    Patient ID: Jordan [name], male    DOB: 07/26/1975, 44 y.o.   MRN: [id number]  Patient presents today for CPE   HPI Hypertension BP at goal with amlodipine BP Readings from Last 3 Encounters:  03/09/20 124/80  12/24/19 (!) 130/86  10/24/19 (!) 143/98   Continue current medication F/up in 5months  Heartburn Not controlled with omeprazole 20mg  BID Previous H. Pyloric test: negative.  Start pantoprazole 20mg  daily   Sexual History (orientation,birth control, marital status, STD):  Depression/Suicide:declined need for therapist or medication at this time Depression screen [facility] 2/9 03/09/2020 09/15/2019 07/14/2019  Decreased Interest 1 0 1  Down, Depressed, Hopeless 1 0 2  PHQ - 2 Score 2 0 3  Altered sleeping 1 - 2  Tired, decreased energy 1 - 2  Change in appetite 1 - 0  Feeling bad or failure about yourself  1 - 0  Trouble concentrating 1 - 0  Moving slowly or fidgety/restless 1 - 1  Suicidal thoughts 0 - 0  PHQ-9 Score 8 - 8  Difficult doing work/chores Somewhat difficult - Not difficult at all   Vision:will schedule  Dental:will schedule  Immunizations: (TDAP, Hep C screen, Pneumovax, Influenza, zoster)  Health Maintenance  Topic Date Due   COVID-19 Vaccine (1) Never done   Tetanus Vaccine  Never done   Flu Shot  Completed    Hepatitis C: One time screening is recommended by Center for Disease Control  (CDC) for  adults born from [birth year] through 1965.   Completed   HIV Screening  Completed   Diet:regular.  Weight:  Wt Readings from Last 3 Encounters:  03/09/20 177 lb 12.8 oz (80.6 kg)  12/24/19 177 lb 12.8 oz (80.6 kg)  09/15/19 178 lb 6.4 oz (80.9 kg)    Fall Risk: Fall Risk  09/15/2019  Number falls in past yr: 0  Injury with Fall? 0   Medications and allergies reviewed with patient and updated if appropriate.  Patient Active Problem List   Diagnosis Date Noted   Elevated LFTs 07/29/2019   Elevated LDL cholesterol level  07/29/2019   SOB (shortness of breath) 07/27/2019   Seasonal and perennial allergic rhinoconjunctivitis 07/20/2019   Asthma 07/20/2019   Heartburn 07/20/2019   Stress and adjustment reaction 07/14/2019   Healthcare maintenance 07/14/2019   Hypertension 07/14/2019    Current Outpatient Medications on File Prior to Visit  Medication Sig Dispense Refill   albuterol (PROAIR HFA) 108 (90 Base) MCG/ACT inhaler Inhale 1-2 puffs into the lungs every 6 (six) hours as needed for wheezing or shortness of breath. 18 g 1   azelastine (ASTELIN) 0.1 % nasal spray 2 sprays per nostril 2 times daily as needed for drainage. 30 mL 5   beclomethasone (QVAR REDIHALER) 80 MCG/ACT inhaler Inhale 2 puffs into the lungs 2 (two) times daily. Rinse mouth out after each use. 21.2 g 3   Budeson-Glycopyrrol-Formoterol (BREZTRI AEROSPHERE) 160-9-4.8 MCG/ACT AERO Inhale 2 puffs into the lungs in the morning and at bedtime. 32.1 g 3   Dexchlorpheniramine Maleate (RYCLORA) 2 MG/5ML SOLN Take 5 ml every 4-6 hours as needed 1800 mL 1   EPINEPHrine (EPIPEN 2-PAK) 0.3 mg/0.3 mL IJ SOAJ injection INJECT 0.3 MLS INTO MUSCLE     EPIPEN 2-PAK 0.3 MG/0.3ML SOAJ injection INJECT 0.3 MLS INTO MUSCLE     Fluticasone-Umeclidin-Vilant (TRELEGY ELLIPTA) 200-62.5-25 MCG/INH AEPB Inhale 1 puff into the lungs daily. 180 each 1   ibuprofen (ADVIL) 800 MG tablet Take 1  tablet (800 mg total) by mouth 3 (three) times daily. 21 tablet 0   ipratropium (ATROVENT) 0.06 % nasal spray USE 2 SPRAYS IN EACH NOSTRIL THREE TIMES DAILY     ipratropium (ATROVENT) 0.06 % nasal spray Place 2 sprays into both nostrils 3 (three) times daily.     ipratropium-albuterol (DUONEB) 0.5-2.5 (3) MG/3ML SOLN Take 3 mLs by nebulization every 6 (six) hours as needed (wheezing, shortness of breath, chest tightness). 360 mL 0   Mepolizumab (NUCALA [place]) Inject into the skin.     montelukast (SINGULAIR) 10 MG tablet Take 1 tablet (10 mg total) by mouth at  bedtime. 90 tablet 0   naproxen (NAPROSYN) 500 MG tablet Take 1 tablet (500 mg total) by mouth 2 (two) times daily with a meal. 60 tablet 2   ondansetron (ZOFRAN) 8 MG tablet TAKE 1 TABLET (8 MG TOTAL) BY MOUTH EVERY 8 (EIGHT) HOURS AS NEEDED FOR NAUSEA OR VOMITING. 18 tablet 0   Spacer/Aero-Hold Chamber Mask MISC 1 each by Other route 3 times/day as needed-between meals & bedtime. 1 each 0   Current Facility-Administered Medications on File Prior to Visit  Medication Dose Route Frequency Provider Last Rate Last Admin   Mepolizumab SOLR 100 mg  100 mg Subcutaneous Q28 days Marcelyn Bruins, MD   100 mg at 02/09/20 1652    Past Medical History:  Diagnosis Date   Asthma    Reflux esophagitis     Past Surgical History:  Procedure Laterality Date   NO PAST SURGERIES      Social History   Socioeconomic History   Marital status: Married    Spouse name: Not on file   Number of children: Not on file   Years of education: Not on file   Highest education level: Not on file  Occupational History   Not on file  Tobacco Use   Smoking status: Never Smoker   Smokeless tobacco: Never Used  Vaping Use   Vaping Use: Never used  Substance and Sexual Activity   Alcohol use: Yes    Comment: occ   Drug use: No   Sexual activity: Yes    Birth control/protection: None  Other Topics Concern   Not on file  Social History Narrative   Not on file   Social Determinants of Health   Financial Resource Strain:    Difficulty of Paying Living Expenses: Not on file  Food Insecurity:    Worried About Running Out of Food in the Last Year: Not on file   [employer] of Food in the Last Year: Not on file  Transportation Needs:    Lack of Transportation (Medical): Not on file   Lack of Transportation (Non-Medical): Not on file  Physical Activity:    Days of Exercise per Week: Not on file   Minutes of Exercise per Session: Not on file  Stress:    Feeling of Stress  : Not on file  Social Connections:    Frequency of Communication with Friends and Family: Not on file   Frequency of Social Gatherings with Friends and Family: Not on file   Attends Religious Services: Not on file   Active Member of Clubs or Organizations: Not on file   Attends Banker Meetings: Not on file   Marital Status: Not on file    Family History  Problem Relation Age of Onset   Hypertension Mother    Glaucoma Mother 22   Hypertension Father  Review of Systems  Constitutional: Negative for fever, malaise/fatigue and weight loss.  HENT: Negative for congestion and sore throat.   Eyes:       Negative for visual changes  Respiratory: Negative for cough and shortness of breath.   Cardiovascular: Negative for chest pain, palpitations and leg swelling.  Gastrointestinal: Positive for heartburn. Negative for abdominal pain, blood in stool, constipation, diarrhea, melena, nausea and vomiting.  Genitourinary: Negative for dysuria, frequency and urgency.  Musculoskeletal: Negative for falls, joint pain and myalgias.  Skin: Negative for rash.  Neurological: Negative for dizziness, sensory change and headaches.  Endo/Heme/Allergies: Does not bruise/bleed easily.  Psychiatric/Behavioral: Negative for depression, substance abuse and suicidal ideas. The patient is not nervous/anxious.     Objective:   Vitals:   03/09/20 1301  BP: 124/80  Pulse: 84  Temp: 97.8 F (36.6 C)  SpO2: 98%    Body mass index is 26.45 kg/m.   Physical Examination:  Physical Exam Vitals and nursing note reviewed.  Constitutional:      General: He is not in acute distress.    Appearance: He is well-developed.  HENT:     Right Ear: Tympanic membrane, ear canal and external ear normal.     Left Ear: Tympanic membrane, ear canal and external ear normal.  Eyes:     Extraocular Movements: Extraocular movements intact.     Conjunctiva/sclera: Conjunctivae normal.    Cardiovascular:     Rate and Rhythm: Normal rate and regular rhythm.     Pulses: Normal pulses.     Heart sounds: Normal heart sounds.  Pulmonary:     Effort: Pulmonary effort is normal. No respiratory distress.     Breath sounds: Normal breath sounds.  Chest:     Chest wall: No tenderness.  Abdominal:     General: Bowel sounds are normal.     Palpations: Abdomen is soft.  Musculoskeletal:        General: Normal range of motion.     Cervical back: Normal range of motion and neck supple.  Lymphadenopathy:     Cervical: No cervical adenopathy.  Skin:    General: Skin is warm and dry.  Neurological:     Mental Status: He is alert and oriented to person, place, and time.     Deep Tendon Reflexes: Reflexes are normal and symmetric.  Psychiatric:        Mood and Affect: Mood normal.        Behavior: Behavior normal.        Thought Content: Thought content normal.    ASSESSMENT and PLAN: This visit occurred during the SARS-CoV-2 public health emergency.  Safety protocols were in place, including screening questions prior to the visit, additional usage of staff PPE, and extensive cleaning of exam room while observing appropriate contact time as indicated for disinfecting solutions.   Pranay was seen today for annual exam.  Diagnoses and all orders for this visit:  Preventative health care  Influenza vaccine needed -     Flu Vaccine QUAD 6+ mos PF IM (Fluarix Quad PF)  Elevated LDL cholesterol level -     Comprehensive metabolic panel -     Lipid panel  Essential hypertension -     Comprehensive metabolic panel -     amLODipine (NORVASC) 5 MG tablet; Take 1 tablet (5 mg total) by mouth daily.  Heartburn -     pantoprazole (PROTONIX) 20 MG tablet; Take 1 tablet (20 mg total) by mouth daily.  Problem List Items Addressed This Visit      Other   Elevated LDL cholesterol level   Relevant Orders   Comprehensive metabolic panel   Lipid panel   Heartburn    Not  controlled with omeprazole 20mg  BID Previous H. Pyloric test: negative.  Start pantoprazole 20mg  daily       Relevant Medications   pantoprazole (PROTONIX) 20 MG tablet    Other Visit Diagnoses    Preventative health care    -  Primary   Influenza vaccine needed       Relevant Orders   Flu Vaccine QUAD 6+ mos PF IM (Fluarix Quad PF) (Completed)   Essential hypertension       Relevant Medications   amLODipine (NORVASC) 5 MG tablet   Other Relevant Orders   Comprehensive metabolic panel      Follow up: Return in about 6 months (around 09/07/2020) for HTN and , hyperlipidemia.  Alysia Pennaharlotte Yahsir Wickens, NP

## 2020-03-09 NOTE — Assessment & Plan Note (Addendum)
Not controlled with omeprazole 20mg  BID Previous H. Pyloric test: negative.  Start pantoprazole 20mg  daily

## 2020-03-09 NOTE — Telephone Encounter (Signed)
Spoke with patient and he is doing better.  Patient had physical with primary care today.  He did get medication to help with his vertigo.  Patient states he did receive his flu vaccine today.  Informed patient he will need to wait on getting his Nucala for 48 hours after receiving flu vaccine.  Changed appointment for Nucala to 03/15/20 to 6:00 pm.  Patient needs 6:00 pm due to transportation.

## 2020-03-10 ENCOUNTER — Ambulatory Visit: Payer: Self-pay

## 2020-03-10 MED ORDER — FENOFIBRATE 145 MG PO TABS: 145.0000 mg | ORAL_TABLET | Freq: Every day | ORAL | 1 refills | Status: DC

## 2020-03-10 NOTE — Addendum Note (Signed)
Addended by: Alysia Penna L on: 03/10/2020 10:13 AM   Modules accepted: Orders

## 2020-03-11 ENCOUNTER — Other Ambulatory Visit: Payer: Self-pay

## 2020-03-11 ENCOUNTER — Telehealth: Payer: Self-pay | Admitting: Allergy

## 2020-03-11 MED ORDER — PREDNISONE 20 MG PO TABS: 20.0000 mg | ORAL_TABLET | Freq: Every day | ORAL | 0 refills | Status: AC

## 2020-03-11 NOTE — Telephone Encounter (Signed)
patient called and would like to talk with dr padgett nurse about his heavy drainage he is having. No fever. [facility] [address]  [phone].

## 2020-03-11 NOTE — Telephone Encounter (Signed)
Unfortunately he is already using all the nasal spray options we have.  Make sure he is performing saline rinses as well.  I believe he has an ENT appt this month; did he address this with ENT? Since its been several weeks and he is having sinus pressure and congestion we can do a short prednisone 20mg  x 5 day burst to help with the inflammation of the sinuses.

## 2020-03-11 NOTE — Telephone Encounter (Signed)
Yes he should still come for his Nucala injection.

## 2020-03-11 NOTE — Telephone Encounter (Signed)
Patient says his appointment is on the 29 of this month with ENT and he will address this issue with them as well. He wants to know if he can get his injection on Tuesday or should he hold off. Prednisone sent as well.

## 2020-03-11 NOTE — Telephone Encounter (Signed)
I called patient back and states he has been having a lot of pressure behind his head that has been bothering him the past few weeks. He also states he is having heavy drainage but sinus pressure. He states his nostrils feel swollen and also dry at times. He wants to know what he can do to feel better. Please advice.

## 2020-03-11 NOTE — Telephone Encounter (Signed)
Left voicemail to inform patient of information below.

## 2020-03-14 ENCOUNTER — Telehealth: Payer: Self-pay | Admitting: Allergy

## 2020-03-14 NOTE — Telephone Encounter (Signed)
Patient would like a nurse to call him back.

## 2020-03-14 NOTE — Telephone Encounter (Signed)
Returned patients call. Patient wanted to know if he could still come in for his Nucala shot tomorrow since he was put on prednisone Friday. Per Dr. Delorse Lek it is okay for him to come in tomorrow to get his shot. Patient was notified and verbalized understanding.

## 2020-03-15 ENCOUNTER — Ambulatory Visit (INDEPENDENT_AMBULATORY_CARE_PROVIDER_SITE_OTHER): Payer: BC Managed Care – PPO

## 2020-03-15 ENCOUNTER — Other Ambulatory Visit: Payer: Self-pay

## 2020-03-15 DIAGNOSIS — J4550 Severe persistent asthma, uncomplicated: Secondary | ICD-10-CM

## 2020-03-15 DIAGNOSIS — J4540 Moderate persistent asthma, uncomplicated: Secondary | ICD-10-CM

## 2020-03-18 ENCOUNTER — Telehealth: Payer: Self-pay | Admitting: Nurse Practitioner

## 2020-03-18 DIAGNOSIS — R12 Heartburn: Secondary | ICD-10-CM

## 2020-03-18 MED ORDER — PANTOPRAZOLE SODIUM 20 MG PO TBEC: 20.0000 mg | DELAYED_RELEASE_TABLET | Freq: Every day | ORAL | 1 refills | Status: DC

## 2020-03-18 NOTE — Telephone Encounter (Signed)
Patient states that he needs his Pantoprazole sent to [facility] on [address] (90 day supply) in order for insurance to pay for it. Please give him a call once this has been resent.  Patients contact info: [phone]

## 2020-03-18 NOTE — Telephone Encounter (Signed)
Rx sent to pharmacy and I called and canceled Rx at [facility].

## 2020-03-18 NOTE — Telephone Encounter (Signed)
Ok to transfer rx

## 2020-03-18 NOTE — Telephone Encounter (Signed)
Spoke with pt and he states he has not picked up Rx sent to [facility] on 03/09/2020. Pt states he spoke with insurance and they informed him that he should have Rx sent to [facility] and they would cover the cost. Please advise

## 2020-03-21 ENCOUNTER — Telehealth: Payer: Self-pay

## 2020-03-21 NOTE — Telephone Encounter (Signed)
Patient would like for Ashleigh or Beth to give him a call in regards to a letter that they usually handle for him with court.

## 2020-03-21 NOTE — Telephone Encounter (Signed)
Called and spoke with patient and have taken care of the letter.

## 2020-03-21 NOTE — Telephone Encounter (Signed)
Called and spoke with the patient and he stated that he would like for the letter to be faxed this week dated for November 1st, 2021 to [employer] at [phone]. I advised that I will have Dr. Delorse Lek sign the letter and fax it over this Wednesday. Patient verbalized understanding. Letter has been placed in Dr. Randell Patient box in her office.

## 2020-03-23 ENCOUNTER — Encounter: Payer: Self-pay | Admitting: *Deleted

## 2020-03-23 NOTE — Telephone Encounter (Signed)
Letter has been signed and has been faxed to provided number.

## 2020-03-25 NOTE — Telephone Encounter (Signed)
Letter was signed by Dr. Delorse Lek and faxed by Apolonio Schneiders on 03/23/20.  Called patient.  No answer and unable to leave a message due to mailbox was full.  Will call patient again later today.

## 2020-03-25 NOTE — Telephone Encounter (Signed)
Patient called to check on status of letter.   Please advise

## 2020-03-25 NOTE — Telephone Encounter (Signed)
Patient called back after missing a calla and was informed that letter was signed and faxed on 03/23/2020. Patient verbalized understanding.

## 2020-03-25 NOTE — Telephone Encounter (Signed)
Called and spoke with patient and informed of letter faxed.

## 2020-03-28 ENCOUNTER — Telehealth: Payer: Self-pay | Admitting: Nurse Practitioner

## 2020-03-28 DIAGNOSIS — R12 Heartburn: Secondary | ICD-10-CM

## 2020-03-28 NOTE — Telephone Encounter (Signed)
Pt would like a different Rx besides pantoprazole because he states he has a problem getting it at the pharmacy they always have problems running it through his insurance. Pt would also like something for anxiety and would like to know if he would have to be seen for that?

## 2020-03-28 NOTE — Telephone Encounter (Signed)
Patient is calling and requesting a call back regarding medications, please advise. CB is [phone]

## 2020-03-29 MED ORDER — OMEPRAZOLE 20 MG PO CPDR: 20.0000 mg | DELAYED_RELEASE_CAPSULE | Freq: Every day | ORAL | 1 refills | Status: DC

## 2020-03-29 NOTE — Addendum Note (Signed)
Addended by: Michaela Corner on: 03/29/2020 03:29 PM   Modules accepted: Orders

## 2020-03-29 NOTE — Telephone Encounter (Signed)
Needs to schedule video appt to discuss anxiety and depression.

## 2020-03-31 ENCOUNTER — Telehealth: Payer: Self-pay | Admitting: Allergy

## 2020-03-31 NOTE — Telephone Encounter (Signed)
Called and left a voicemail asking for patient to return call.  °

## 2020-03-31 NOTE — Telephone Encounter (Signed)
Patient called back and I spoke him. Beth and I are working on with this. Patient is aware.

## 2020-03-31 NOTE — Telephone Encounter (Signed)
Patient called and said that the letter that you did was not good and they want another one today he needs Morrie Sheldon or Waynetta Sandy to call him [phone].

## 2020-03-31 NOTE — Telephone Encounter (Signed)
Spoke with Waynetta Sandy and though we cannot alter the previous letter we did generate a new letter that stated the dates of the patient's office visit and immunotherapy visits this year and that Dr. Delorse Lek is his attending provider and is out of town until next Tuesday and if further documentation is needed to call the office. Called patient and advised, patient verbalized understanding. New letter has been faxed with previous letter to previous stated fax number.

## 2020-04-01 ENCOUNTER — Telehealth (INDEPENDENT_AMBULATORY_CARE_PROVIDER_SITE_OTHER): Payer: BC Managed Care – PPO | Admitting: Nurse Practitioner

## 2020-04-01 ENCOUNTER — Encounter: Payer: Self-pay | Admitting: Nurse Practitioner

## 2020-04-01 VITALS — Ht 68.75 in | Wt 177.0 lb

## 2020-04-01 DIAGNOSIS — F411 Generalized anxiety disorder: Secondary | ICD-10-CM | POA: Insufficient documentation

## 2020-04-01 DIAGNOSIS — F339 Major depressive disorder, recurrent, unspecified: Secondary | ICD-10-CM | POA: Insufficient documentation

## 2020-04-01 DIAGNOSIS — N529 Male erectile dysfunction, unspecified: Secondary | ICD-10-CM | POA: Diagnosis not present

## 2020-04-01 MED ORDER — VENLAFAXINE HCL ER 37.5 MG PO CP24: 37.5000 mg | ORAL_CAPSULE | Freq: Every day | ORAL | 5 refills | Status: DC

## 2020-04-01 NOTE — Assessment & Plan Note (Signed)
Ongoing for 1year, worse in last 23months, has difficulty maintaining an erection in last 24months due to increased anxiety per patient. reports he had a psychiatrist with [facility], last seen over 1year ago, unable to remember name of medication used. He is unable to continue appts due to financial difficulty. States he was given diagnosis of bipolar disorder and GAD.  Today he declined referral to therapist Start effexor Check testosterone, urinalysis and urine cytology. Normal CBC, CMP and TSH in last 79months F/up in 8month

## 2020-04-01 NOTE — Progress Notes (Signed)
Virtual Visit via Video Note  I connected with@ on 04/01/20 at 10:00 AM EDT by a video enabled telemedicine application and verified that I am speaking with the correct person using two identifiers.  Location: Patient:Home Provider: Office Participants: patient and provider   I discussed the limitations of evaluation and management by telemedicine and the availability of in person appointments. I also discussed with the patient that there may be a patient responsible charge related to this service. The patient expressed understanding and agreed to proceed.  WU:XLKG for anxiety and depression  History of Present Illness: Anxiety Presents for initial visit. Onset was 1 to 5 years ago. The problem has been waxing and waning. Symptoms include decreased concentration, depressed mood, excessive worry, impotence, muscle tension and nervous/anxious behavior. Patient reports no suicidal ideas. Symptoms occur most days. The severity of symptoms is causing significant distress and interfering with daily activities. The quality of sleep is fair.   Risk factors include recent illness and change in medication. His past medical history is significant for anxiety/panic attacks, bipolar disorder and depression. There is no history of hyperthyroidism or suicide attempts. Past treatments include non-SSRI antidepressants. The treatment provided mild relief. Compliance with prior treatments has been poor. Prior compliance problems include insurance issues.  has difficulty maintaining an erection in last 66months due to increased anxiety per patient reports he has psychiatrist with [facility], last seen over 1year ago, unable to remember name of medication used. Declined referral to therapist Depression screen [facility] 2/9 03/09/2020 09/15/2019 07/14/2019  Decreased Interest 1 0 1  Down, Depressed, Hopeless 1 0 2  PHQ - 2 Score 2 0 3  Altered sleeping 1 - 2  Tired, decreased energy 1 - 2  Change in appetite  1 - 0  Feeling bad or failure about yourself  1 - 0  Trouble concentrating 1 - 0  Moving slowly or fidgety/restless 1 - 1  Suicidal thoughts 0 - 0  PHQ-9 Score 8 - 8  Difficult doing work/chores Somewhat difficult - Not difficult at all   GAD 7 : Generalized Anxiety Score 03/09/2020 07/14/2019  Nervous, Anxious, on Edge 1 1  Control/stop worrying 1 2  Worry too much - different things 1 2  Trouble relaxing 1 2  Restless 1 2  Easily annoyed or irritable 1 2  Afraid - awful might happen 1 1  Total GAD 7 Score 7 12  Anxiety Difficulty Somewhat difficult Not difficult at all   Observations/Objective: Physical Exam Psychiatric:        Attention and Perception: Attention normal.        Mood and Affect: Affect is flat.        Speech: Speech normal.        Behavior: Behavior is cooperative.        Thought Content: Thought content normal.        Cognition and Memory: Cognition normal.    Assessment and Plan: Fionn was seen today for acute visit.  Diagnoses and all orders for this visit:  GAD (generalized anxiety disorder) -     venlafaxine XR (EFFEXOR XR) 37.5 MG 24 hr capsule; Take 1 capsule (37.5 mg total) by mouth daily with breakfast.  Erectile dysfunction, unspecified erectile dysfunction type -     Testosterone; Future -     Urine cytology ancillary only([facility]); Future -     Urinalysis; Future   Follow Up Instructions: Go to lab for blood draw and urine collection on 04/05/20 at 3:40pm  Start effexor 1tab daily Schedule appt with therapist as soon as possible. F/up with me in 49month (video appt)   I discussed the assessment and treatment plan with the patient. The patient was provided an opportunity to ask questions and all were answered. The patient agreed with the plan and demonstrated an understanding of the instructions.   The patient was advised to call back or seek an in-person evaluation if the symptoms worsen or if the condition fails to improve as  anticipated.  Alysia Penna, NP

## 2020-04-01 NOTE — Patient Instructions (Addendum)
Go to lab for blood draw and urine collection on 04/05/20 at 3:40pm Start effexor 1tab daily Schedule appt with therapist as soon as possible. F/up with me in 25month (video appt).   Managing Anxiety, Adult After being diagnosed with an anxiety disorder, you may be relieved to know why you have felt or behaved a certain way. You may also feel overwhelmed about the treatment ahead and what it will mean for your life. With care and support, you can manage this condition and recover from it. How to manage lifestyle changes Managing stress and anxiety  Stress is your body's reaction to life changes and events, both good and bad. Most stress will last just a few hours, but stress can be ongoing and can lead to more than just stress. Although stress can play a major role in anxiety, it is not the same as anxiety. Stress is usually caused by something external, such as a deadline, test, or competition. Stress normally passes after the triggering event has ended.  Anxiety is caused by something internal, such as imagining a terrible outcome or worrying that something will go wrong that will devastate you. Anxiety often does not go away even after the triggering event is over, and it can become long-term (chronic) worry. It is important to understand the differences between stress and anxiety and to manage your stress effectively so that it does not lead to an anxious response. Talk with your health care provider or a counselor to learn more about reducing anxiety and stress. He or she may suggest tension reduction techniques, such as:  Music therapy. This can include creating or listening to music that you enjoy and that inspires you.  Mindfulness-based meditation. This involves being aware of your normal breaths while not trying to control your breathing. It can be done while sitting or walking.  Centering prayer. This involves focusing on a word, phrase, or sacred image that means something to you and  brings you peace.  Deep breathing. To do this, expand your stomach and inhale slowly through your nose. Hold your breath for 3-5 seconds. Then exhale slowly, letting your stomach muscles relax.  Self-talk. This involves identifying thought patterns that lead to anxiety reactions and changing those patterns.  Muscle relaxation. This involves tensing muscles and then relaxing them. Choose a tension reduction technique that suits your lifestyle and personality. These techniques take time and practice. Set aside 5-15 minutes a day to do them. Therapists can offer counseling and training in these techniques. The training to help with anxiety may be covered by some insurance plans. Other things you can do to manage stress and anxiety include:  Keeping a stress/anxiety diary. This can help you learn what triggers your reaction and then learn ways to manage your response.  Thinking about how you react to certain situations. You may not be able to control everything, but you can control your response.  Making time for activities that help you relax and not feeling guilty about spending your time in this way.  Visual imagery and yoga can help you stay calm and relax.  Medicines Medicines can help ease symptoms. Medicines for anxiety include:  Anti-anxiety drugs.  Antidepressants. Medicines are often used as a primary treatment for anxiety disorder. Medicines will be prescribed by a health care provider. When used together, medicines, psychotherapy, and tension reduction techniques may be the most effective treatment. Relationships Relationships can play a big part in helping you recover. Try to spend more time connecting with trusted  friends and family members. Consider going to couples counseling, taking family education classes, or going to family therapy. Therapy can help you and others better understand your condition. How to recognize changes in your anxiety Everyone responds differently to  treatment for anxiety. Recovery from anxiety happens when symptoms decrease and stop interfering with your daily activities at home or work. This may mean that you will start to:  Have better concentration and focus. Worry will interfere less in your daily thinking.  Sleep better.  Be less irritable.  Have more energy.  Have improved memory. It is important to recognize when your condition is getting worse. Contact your health care provider if your symptoms interfere with home or work and you feel like your condition is not improving. Follow these instructions at home: Activity  Exercise. Most adults should do the following: ? Exercise for at least 150 minutes each week. The exercise should increase your heart rate and make you sweat (moderate-intensity exercise). ? Strengthening exercises at least twice a week.  Get the right amount and quality of sleep. Most adults need 7-9 hours of sleep each night. Lifestyle   Eat a healthy diet that includes plenty of vegetables, fruits, whole grains, low-fat dairy products, and lean protein. Do not eat a lot of foods that are high in solid fats, added sugars, or salt.  Make choices that simplify your life.  Do not use any products that contain nicotine or tobacco, such as cigarettes, e-cigarettes, and chewing tobacco. If you need help quitting, ask your health care provider.  Avoid caffeine, alcohol, and certain over-the-counter cold medicines. These may make you feel worse. Ask your pharmacist which medicines to avoid. General instructions  Take over-the-counter and prescription medicines only as told by your health care provider.  Keep all follow-up visits as told by your health care provider. This is important. Where to find support You can get help and support from these sources:  Self-help groups.  Online and [employer].  A trusted spiritual leader.  Couples counseling.  Family education classes.  Family  therapy. Where to find more information You may find that joining a support group helps you deal with your anxiety. The following sources can help you locate counselors or support groups near you:  Mental Health America: www.mentalhealthamerica.net  Anxiety and Depression Association of [place] (ADAA): ProgramCam.de  [employer] on Mental Illness (NAMI): www.nami.org Contact a health care provider if you:  Have a hard time staying focused or finishing daily tasks.  Spend many hours a day feeling worried about everyday life.  Become exhausted by worry.  Start to have headaches, feel tense, or have nausea.  Urinate more than normal.  Have diarrhea. Get help right away if you have:  A racing heart and shortness of breath.  Thoughts of hurting yourself or others. If you ever feel like you may hurt yourself or others, or have thoughts about taking your own life, get help right away. You can go to your nearest emergency department or call:  Your local emergency services (911 in the [place]  A suicide crisis helpline, such as the National Suicide Prevention Lifeline at (802)752-4211. This is open 24 hours a day. Summary  Taking steps to learn and use tension reduction techniques can help calm you and help prevent triggering an anxiety reaction.  When used together, medicines, psychotherapy, and tension reduction techniques may be the most effective treatment.  Family, friends, and partners can play a big part in helping you recover from  an anxiety disorder. This information is not intended to replace advice given to you by your health care provider. Make sure you discuss any questions you have with your health care provider. Document Revised: 10/14/2018 Document Reviewed: 10/14/2018 Elsevier Patient Education  [place].

## 2020-04-04 ENCOUNTER — Telehealth: Payer: Self-pay

## 2020-04-04 DIAGNOSIS — F411 Generalized anxiety disorder: Secondary | ICD-10-CM

## 2020-04-04 MED ORDER — DULOXETINE HCL 20 MG PO CPEP: 20.0000 mg | ORAL_CAPSULE | Freq: Every day | ORAL | 5 refills | Status: DC

## 2020-04-04 NOTE — Telephone Encounter (Signed)
Patient notified and verbalized understanding Pt states he is willing to try Cymbalta

## 2020-04-04 NOTE — Telephone Encounter (Signed)
Called and spoke with patient regarding letter that was faxed last week.  Patient states he has a new social worker(Kaitie) and she is requiring more information than what previous Child psychotherapist required.  She does not want same letter sent each time or just dates of service sent.  She wants update on his condition and explanation that he needs to stay in the area close to his providers due to asthma and flares.  Patient states he can't travel hours away and does have transportation issues.  Patient was being asked to travel to [place], [place] for court. Patient also states he is having to take anxiety medication now from his PCP. Explained to patient I would send message to Dr. Delorse Lek and discuss with her when she was back in the office.  Stressed to patient that Dr.Padgett was still needing him to get the Methacholine Challenge done and that the results from that test were important in his care going forward.  Provided the phone number for him to call and get the challenge scheduled again.  Informed him if he did not get it scheduled or if he had to leave another message and they did not call him back, then I would call the office.  Patient voiced understanding. Patient called back and states he was given an extension date of December 01,2021 since he was informed at last minute of additional information needed.   Patient also stated he called Dr. Ledell Peoples office and left message for someone to call him back to schedule Methacholine Challenge.

## 2020-04-04 NOTE — Telephone Encounter (Signed)
Pt calling to see if [facility] can send in a different medication in for the Effexor 37.5MG .  Pt stated that the medication has made him have lost of appitate and also loss of sleep.  Pt would like it sent in today to pharmacy on file.  CB [phone]

## 2020-04-04 NOTE — Telephone Encounter (Signed)
Pt states since taking the medication he was prescribed does calm him but the side effects are not good. Pt states everything he discuss with you during his virtual visit has been 10x worse since starting the medication Venlafaxine. Pt states he also has experienced stomach issues since taking it as well and would like to know what he should do. Please advise.

## 2020-04-04 NOTE — Telephone Encounter (Signed)
Advise to discontinue medication and enter referral to psychiatry. Will he like to try cymbalta in place?

## 2020-04-04 NOTE — Telephone Encounter (Signed)
Patient would like to talk to [facility] or [place] regarding the letter. Patient would like to explain it in depth, because they are getting very technical.  Please advise.

## 2020-04-04 NOTE — Telephone Encounter (Signed)
cymbalta sent

## 2020-04-04 NOTE — Telephone Encounter (Signed)
See other telephone note.  

## 2020-04-05 ENCOUNTER — Other Ambulatory Visit: Payer: Self-pay

## 2020-04-06 ENCOUNTER — Other Ambulatory Visit: Payer: BC Managed Care – PPO

## 2020-04-06 NOTE — Telephone Encounter (Signed)
Pt informed of message below.

## 2020-04-06 NOTE — Telephone Encounter (Signed)
Pt returning call and stated that he has been still having nausea even taking the max dose of ondansetron.  Pt said he still get nauseous after taking medication.  Pt is requesting a new rx to be sent in today.  Please advise.   [facility] [phone] [place], [place] [phone] [facility] [address] AT [facility] [address]  [address] [place] [address]

## 2020-04-06 NOTE — Telephone Encounter (Signed)
Patient called back today and states he just started the new medication that was sent in and he is still having stomach issues. Please advise.

## 2020-04-06 NOTE — Telephone Encounter (Signed)
Noted and Dr. Delorse Lek informed.  I called Dr. Ledell Peoples office and spoke to Mila Doce to get update on scheduling of Methacholine challenge.  Per Annice Pih, they do not do the scheduling and transferred me to main number [phone].  Spoke to Pulmonary department and transferred to Pulmonary Function Lab at [phone] and did not get an answer.  Informed Dr. Delorse Lek and will call again tomorrow.

## 2020-04-06 NOTE — Telephone Encounter (Signed)
I do not recommend change in medication at this time. He should try taking medication after food intake. This typically subsides

## 2020-04-06 NOTE — Telephone Encounter (Signed)
What GI symptoms is he experiencing?

## 2020-04-06 NOTE — Telephone Encounter (Signed)
Patient wanted to let Beth and/ or Jordan [name] that he called again to try to schedule Methacholine challenge.

## 2020-04-07 NOTE — Telephone Encounter (Signed)
Oh this is great finally getting arranged now.

## 2020-04-07 NOTE — Telephone Encounter (Signed)
Called Pulmonary Function Lab at [phone] and spoke to [place].  Per Ladene Artist, he called [facility] this morning and left a voicemail message to return his call to schedule the challenge.  I asked Ladene Artist if he had previous messages left that were not returned and he stated that today was first attempt to contact Leonette Most to schedule due to them having so many orders.  His order just came up and Ladene Artist contacted [facility] today.  Informed Derrick I would call patient since he usually answers our calls and give him the direct number to Pulmonary Function Lab to schedule. Called [place] and informed him Ladene Artist had called him this morning to schedule the Methacholine challenge.  Jerene Canny the direct number for him to call Derrick back.  Informed Tricia that Dr. Delorse Lek would be updated.  Jah voiced understanding and stated he would call Ladene Artist to schedule challenge.

## 2020-04-11 ENCOUNTER — Other Ambulatory Visit: Payer: Self-pay

## 2020-04-12 ENCOUNTER — Other Ambulatory Visit: Payer: BC Managed Care – PPO

## 2020-04-12 ENCOUNTER — Ambulatory Visit: Payer: Self-pay

## 2020-04-12 ENCOUNTER — Telehealth: Payer: Self-pay | Admitting: *Deleted

## 2020-04-12 NOTE — Telephone Encounter (Signed)
Called and spoke with Thelma Comp.  Per Florentina Addison, she needs letter that does state an updated progress regarding Dr. Christy Gentles.  Letters we faxed stated same thing, but different date.  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  I did let Florentina Addison know that Mr. Emig does get transportation to his office visits and injections.  Informed Katie I would update Dr. Delorse Lek and Florentina Addison is available if there are any further questions.

## 2020-04-12 NOTE — Telephone Encounter (Signed)
Called patient to get update and get contact information for Thelma Comp since we only have fax number.  Explained to patient it would be best if Dr. Delorse Lek had information from [facility] on what additional information she is requiring for additional documentation and letter so Dr. Delorse Lek does not do letter and it be denied and have to be redone again.  Patient gave contact number for Katie Iness [phone]. Patient stated that in the area that he has to attend court that the urgent care would send him to [facility] or back to [place] for care.  His employer is not offering any duties that he can perform at this time.  Patient did state his Methacholine challenge is scheduled for 05/04/20.  Informed him I would contact Katie to see if she could provide information for Dr. Delorse Lek or arrange a time for her to speak with Dr. Delorse Lek to clarify what was being requested.  Patient verbalized understanding.

## 2020-04-13 ENCOUNTER — Encounter: Payer: Self-pay | Admitting: Allergy

## 2020-04-13 ENCOUNTER — Ambulatory Visit (INDEPENDENT_AMBULATORY_CARE_PROVIDER_SITE_OTHER): Payer: BC Managed Care – PPO | Admitting: Allergy

## 2020-04-13 ENCOUNTER — Other Ambulatory Visit: Payer: Self-pay

## 2020-04-13 DIAGNOSIS — J302 Other seasonal allergic rhinitis: Secondary | ICD-10-CM | POA: Diagnosis not present

## 2020-04-13 DIAGNOSIS — J4540 Moderate persistent asthma, uncomplicated: Secondary | ICD-10-CM | POA: Diagnosis not present

## 2020-04-13 DIAGNOSIS — H1010 Acute atopic conjunctivitis, unspecified eye: Secondary | ICD-10-CM

## 2020-04-13 DIAGNOSIS — R12 Heartburn: Secondary | ICD-10-CM | POA: Diagnosis not present

## 2020-04-13 DIAGNOSIS — J3089 Other allergic rhinitis: Secondary | ICD-10-CM

## 2020-04-13 MED ORDER — PANTOPRAZOLE SODIUM 40 MG PO TBEC: 40.0000 mg | DELAYED_RELEASE_TABLET | Freq: Every day | ORAL | 5 refills | Status: DC

## 2020-04-13 NOTE — Progress Notes (Signed)
RE: Jordan [name] MRN: [id number] DOB: [birth year] Date of Telemedicine Visit: 04/13/2020  Referring provider: Anne Ng, NP Primary care provider: Anne Ng, NP  Chief Complaint: Asthma (Shortness of breath, congestion)   Telemedicine Follow Up Visit via Telephone: I connected with Jordan [name] for a follow up on 04/13/20 by telephone and verified that I am speaking with the correct person using two identifiers.   I discussed the limitations, risks, security and privacy concerns of performing an evaluation and management service by telephone and the availability of in person appointments. I also discussed with the patient that there may be a patient responsible charge related to this service. The patient expressed understanding and agreed to proceed.  Patient is at home.  Provider is at the office.  Visit start time: 13:28 Visit end time: 13:49 Insurance consent/check in by: Jordan [name] Medical consent and medical assistant/nurse: Jersey K  History of Present Illness: He is a 44 y.o. male, who is being followed for asthma, reflux and allergic rhinitis with conjunctivitis. His previous allergy office visit was on 12/24/19 with Dr. Delorse [name].   He states he has been more short of breath and having more sinus congestion.  However he states this time of the year he normally has needed to go to ED and he hasn't.  He does attribute this to Nucala use.  He is due for Nucala dose tomorrow.  He states he is taking the Breztri 2 puffs twice a day with Qvar 2 puffs twice a day.  He states when he was doing the Qvar 3 puffs it was "too strong" and he was feeling a bit lightheaded.  Does he does his Qvar 2 puffs at this time without any adverse event.  He still reports a lots of albuterol use via inhaler as well as nebulizer about every 3-4 hours for shortness of breath.  He has gotten some course of prednisone as his last visit but he does not remember how many.  He is scheduled to have a  methacholine challenge at [facility] in early December. With his congestion he states he just continues to have chronic congestion.  He has a follow-up with ENT in the next coming days.  He states the [facility] did not really have any effect.  He is using the Astelin and Atrovent however not sure if this is helping either.  He does taking Ryclora and singulair as well.   He states he doesn't have pantoprazole and that the [facility] was not able to [facility] it for him but he has gotten prescriptions covered from [facility] when [facility] has had issues with [facility].   Assessment and Plan: Nyles is a 44 y.o. male with:   Asthma:   Daily controller medication(s):  o Continue Breztri 2 puffs twice a day and Qvar 2 puffs twice a day with spacer and rinse mouth afterwards. o Continue Singulair 10mg  daily at night.   Prior to physical activity: May use albuterol rescue inhaler 2 puffs 5 to 15 minutes prior to strenuous physical activities.  Rescue medications: May use albuterol rescue inhaler 2 puffs or nebulizer every 4 to 6 hours as needed for shortness of breath, chest tightness, coughing, and wheezing. Monitor frequency of use.  Continue Nucala injections monthly for now  Continue follow-up with Dr. .  Methacholine challenge scheduled in early Dec 2021  Control goals:  o Full participation in all desired activities (may need albuterol before activity) o Albuterol use two times or less a  week on average (not counting use with activity) o Cough interfering with sleep two times or less a month o Oral steroids no more than once a year o No hospitalization  Heartburn  Continue pantoprazole 20mg  in the morning. Nothing to eat or drink for 30 minutes.  Seasonal and perennial allergic rhinoconjunctivitis Past history - 2021 skin testing was positive to grass, tree, mold, dust mites.   Continued nasal congestion  Continue nasal saline rinses  Continue with nasal Atrovent 2 sprays each nostril  up to 3-4 times a day as needed for nasal congestion and drainage  Continue nasal Asteline 2 sprays each nostril twice a day for nasal drainage  2022 has not been effective  Continue Ryclora take 69ml every 4-6 hours as needed.  Continue Singulair  10mg  nightly  If you are having acute worsening of symptoms, please go to ER/urgent care.  Follow up in 3-4 months or sooner if needed  Diagnostics: None.  Medication List:  Current Outpatient Medications  Medication Sig Dispense Refill   albuterol (PROAIR HFA) 108 (90 Base) MCG/ACT inhaler Inhale 1-2 puffs into the lungs every 6 (six) hours as needed for wheezing or shortness of breath. 18 g 1   amLODipine (NORVASC) 5 MG tablet Take 1 tablet (5 mg total) by mouth daily. 90 tablet 3   azelastine (ASTELIN) 0.1 % nasal spray 2 sprays per nostril 2 times daily as needed for drainage. 30 mL 5   beclomethasone (QVAR REDIHALER) 80 MCG/ACT inhaler Inhale 2 puffs into the lungs 2 (two) times daily. Rinse mouth out after each use. 21.2 g 3   Budeson-Glycopyrrol-Formoterol (BREZTRI AEROSPHERE) 160-9-4.8 MCG/ACT AERO Inhale 2 puffs into the lungs in the morning and at bedtime. 32.1 g 3   Dexchlorpheniramine Maleate (RYCLORA) 2 MG/5ML SOLN Take 5 ml every 4-6 hours as needed 1800 mL 1   DULoxetine (CYMBALTA) 20 MG capsule Take 1 capsule (20 mg total) by mouth daily. 30 capsule 5   EPINEPHrine (EPIPEN 2-PAK) 0.3 mg/0.3 mL IJ SOAJ injection INJECT 0.3 MLS INTO MUSCLE     fenofibrate (TRICOR) 145 MG tablet Take 1 tablet (145 mg total) by mouth daily. 90 tablet 1   ibuprofen (ADVIL) 800 MG tablet Take 1 tablet (800 mg total) by mouth 3 (three) times daily. 21 tablet 0   ipratropium (ATROVENT) 0.06 % nasal spray USE 2 SPRAYS IN EACH NOSTRIL THREE TIMES DAILY     ipratropium-albuterol (DUONEB) 0.5-2.5 (3) MG/3ML SOLN Take 3 mLs by nebulization every 6 (six) hours as needed (wheezing, shortness of breath, chest tightness). 360 mL 0   Mepolizumab  (NUCALA Vazquez) Inject into the skin.     montelukast (SINGULAIR) 10 MG tablet Take 1 tablet (10 mg total) by mouth at bedtime. 90 tablet 0   naproxen (NAPROSYN) 500 MG tablet Take 1 tablet (500 mg total) by mouth 2 (two) times daily with a meal. 60 tablet 2   omeprazole (PRILOSEC) 20 MG capsule Take 1 capsule (20 mg total) by mouth daily. 90 capsule 1   ondansetron (ZOFRAN) 8 MG tablet TAKE 1 TABLET (8 MG TOTAL) BY MOUTH EVERY 8 (EIGHT) HOURS AS NEEDED FOR NAUSEA OR VOMITING. 18 tablet 0   Spacer/Aero-Hold Chamber Mask MISC 1 each by Other route 3 times/day as needed-between meals & bedtime. 1 each 0   EPIPEN 2-PAK 0.3 MG/0.3ML SOAJ injection INJECT 0.3 MLS INTO MUSCLE     Fluticasone-Umeclidin-Vilant (TRELEGY ELLIPTA) 200-62.5-25 MCG/INH AEPB Inhale 1 puff into the lungs daily. (Patient not taking:  Reported on 04/13/2020) 180 each 1   ipratropium (ATROVENT) 0.06 % nasal spray Place 2 sprays into both nostrils 3 (three) times daily.     Current Facility-Administered Medications  Medication Dose Route Frequency Provider Last Rate Last Admin   Mepolizumab SOLR 100 mg  100 mg Subcutaneous Q28 days Marcelyn Bruins, MD   100 mg at 03/15/20 1819   Allergies: No Known Allergies I reviewed his past medical history, social history, family history, and environmental history and no significant changes have been reported from previous visit on 12/24/19.  Review of Systems  Constitutional: Negative.   HENT: Positive for congestion, rhinorrhea, sinus pressure and sneezing.   Eyes: Negative.   Respiratory: Positive for cough, chest tightness and shortness of breath.   Cardiovascular: Negative.   Gastrointestinal: Negative.   Musculoskeletal: Negative.   Skin: Negative.   Neurological: Negative.    Objective: Physical Exam Not obtained as encounter was done via telephone.   Previous notes and tests were reviewed.  I discussed the assessment and treatment plan with the patient. The  patient was provided an opportunity to ask questions and all were answered. The patient agreed with the plan and demonstrated an understanding of the instructions.   The patient was advised to call back or seek an in-person evaluation if the symptoms worsen or if the condition fails to improve as anticipated.  I provided 21 minutes of non-face-to-face time during this encounter.  It was my pleasure to participate in Barnett Kludt's care today. Please feel free to contact me with any questions or concerns.   Sincerely,  Yona Stansbury Larose Hires, MD

## 2020-04-13 NOTE — Telephone Encounter (Signed)
Called and left message for patient to call the office.  Need to get him scheduled for televisit with Dr. Delorse Lek.

## 2020-04-13 NOTE — Telephone Encounter (Signed)
Thanks [employer].  Will get him scheduled for a televisit and then an in-person visit sometime after he has had the methacholine challenge and results are in.

## 2020-04-13 NOTE — Patient Instructions (Addendum)
Asthma:  . Daily controller medication(s):  o Continue Breztri 2 puffs twice a day and Qvar 2 puffs twice a day with spacer and rinse mouth afterwards. o Continue Singulair 10mg  daily at night.  . Prior to physical activity: May use albuterol rescue inhaler 2 puffs 5 to 15 minutes prior to strenuous physical activities. Rescue medications: May use albuterol rescue inhaler 2 puffs or nebulizer every 4 to 6 hours as needed for shortness of breath, chest tightness, coughing, and wheezing. Monitor frequency of use. . Continue Nucala injections monthly for now . Continue follow-up with Dr. Marland Kitchen.  Methacholine challenge scheduled in early Dec 2021 . Control goals:  o Full participation in all desired activities (may need albuterol before activity) o Albuterol use two times or less a week on average (not counting use with activity) o Cough interfering with sleep two times or less a month o Oral steroids no more than once a year o No hospitalization  Heartburn  Continue pantoprazole 20mg  in the morning. Nothing to eat or drink for 30 minutes.  Seasonal and perennial allergic rhinoconjunctivitis Past history - 2021 skin testing was positive to grass, tree, mold, dust mites.   Continued nasal congestion  Continue nasal saline rinses  Continue with nasal Atrovent 2 sprays each nostril up to 3-4 times a day as needed for nasal congestion and drainage  Continue nasal Asteline 2 sprays each nostril twice a day for nasal drainage  has not been effective  Continue Ryclora take 58ml every 4-6 hours as needed.  Continue Singulair  10mg  nightly  If you are having acute worsening of symptoms, please go to ER/urgent care.  Follow up in 3-4 months or sooner if needed

## 2020-04-13 NOTE — Telephone Encounter (Signed)
Patient returned call to office.  Dr. Delorse Lek had cancellation today and patient was put on for televisit today at 1:30 pm.

## 2020-04-14 ENCOUNTER — Ambulatory Visit: Payer: Self-pay

## 2020-04-14 NOTE — Telephone Encounter (Signed)
Patient would like to know if we have any samples of Ryclora because it is helping. Patient had to reschedule injection to 11/30 and he states Ryclora would help him until then. Checked in the [place] office and we did not have any. Would any other offices have samples?  Please advise.

## 2020-04-14 NOTE — Telephone Encounter (Signed)
Is there any Ryclora in [place], [place]?

## 2020-04-15 NOTE — Telephone Encounter (Signed)
Called and spoke with the patient and advised that we will have Ryclora in the office on Wednesday for him to pick up. Patient verbalized understanding.

## 2020-04-15 NOTE — Telephone Encounter (Signed)
Yes I can get some Tuesday 04/19/2020 and have Dr. Selena Batten bring it to [facility] Wednesday 04/20/2020.

## 2020-04-19 NOTE — Telephone Encounter (Signed)
Ryclora has been signed out of [facility] for patient and Dr. Selena Batten will bring to the [place] office in the morning.

## 2020-04-20 NOTE — Telephone Encounter (Signed)
Ryclora samples here in [place] office for patient to pick up.   Letter written by Dr. Delorse Lek faxed to Vania Rea at [phone].  Will call Kaitie on Monday to confirm she received letter.

## 2020-04-25 ENCOUNTER — Telehealth: Payer: Self-pay

## 2020-04-25 NOTE — Telephone Encounter (Signed)
Called and spoke with the patient and he stated that he may have issues with coming for his injection tomorrow but as of right now he is fine. He inquired about the letter that Dr. Delorse Lek wrote for him recently and I went over the letter with the patient. Patient verbalized understanding.

## 2020-04-25 NOTE — Telephone Encounter (Signed)
Patient called to speak with Waynetta Sandy or Shelton Silvas.. Patient was having phone issues & I could not understand fully what he was wanting. Patient is working on getting his phone fixed.  Please give him a call back.  Thanks

## 2020-04-26 ENCOUNTER — Ambulatory Visit (INDEPENDENT_AMBULATORY_CARE_PROVIDER_SITE_OTHER): Payer: BC Managed Care – PPO

## 2020-04-26 ENCOUNTER — Other Ambulatory Visit: Payer: Self-pay

## 2020-04-26 DIAGNOSIS — J4540 Moderate persistent asthma, uncomplicated: Secondary | ICD-10-CM

## 2020-04-27 ENCOUNTER — Other Ambulatory Visit: Payer: BC Managed Care – PPO

## 2020-04-28 ENCOUNTER — Telehealth: Payer: Self-pay | Admitting: Nurse Practitioner

## 2020-04-28 DIAGNOSIS — N529 Male erectile dysfunction, unspecified: Secondary | ICD-10-CM

## 2020-04-28 NOTE — Telephone Encounter (Signed)
Pt called back and states he would like your opinion on cialis and sildenafil because the cymbalta is just making things worse. Please advise

## 2020-04-28 NOTE — Telephone Encounter (Signed)
DULoxetine (CYMBALTA) 20 MG capsule Pt states the medication is still causing nausea. He still has the side effects and are worse than before.   Pt states that his allergy/asthma doctor told him he has conjunctivitis? And that it causes him chest discomfort. The allergy specialist said one doesn't have a reason to cause the other.   Pt asking for advise.   Please call at [phone]

## 2020-04-29 ENCOUNTER — Telehealth: Payer: Self-pay

## 2020-04-29 MED ORDER — SILDENAFIL CITRATE 50 MG PO TABS: 50.0000 mg | ORAL_TABLET | Freq: Every day | ORAL | 0 refills | Status: DC | PRN

## 2020-04-29 NOTE — Telephone Encounter (Signed)
Patient wanting a return call in regards to his yesterday.  Please advise.  CB#[phone]

## 2020-04-29 NOTE — Telephone Encounter (Signed)
Informed patient he would receive a return call once the provider responds to the message.

## 2020-04-30 ENCOUNTER — Encounter: Payer: Self-pay | Admitting: Nurse Practitioner

## 2020-05-02 ENCOUNTER — Telehealth: Payer: Self-pay

## 2020-05-02 NOTE — Telephone Encounter (Signed)
Pt calling to ask if his P.O box can be removed for now.  Please advise.

## 2020-05-03 ENCOUNTER — Other Ambulatory Visit: Payer: BC Managed Care – PPO

## 2020-05-04 NOTE — Telephone Encounter (Signed)
Unable to leave voicemail.

## 2020-05-06 ENCOUNTER — Encounter: Payer: Self-pay | Admitting: Nurse Practitioner

## 2020-05-06 ENCOUNTER — Ambulatory Visit: Payer: BC Managed Care – PPO | Admitting: Nurse Practitioner

## 2020-05-24 ENCOUNTER — Ambulatory Visit: Payer: Self-pay

## 2020-05-25 ENCOUNTER — Other Ambulatory Visit: Payer: Self-pay | Admitting: Nurse Practitioner

## 2020-05-25 DIAGNOSIS — N529 Male erectile dysfunction, unspecified: Secondary | ICD-10-CM

## 2020-05-31 ENCOUNTER — Other Ambulatory Visit: Payer: Self-pay | Admitting: Allergy

## 2020-05-31 ENCOUNTER — Other Ambulatory Visit: Payer: Self-pay

## 2020-05-31 ENCOUNTER — Encounter: Payer: Self-pay | Admitting: Nurse Practitioner

## 2020-06-01 NOTE — Telephone Encounter (Signed)
Last VV 04/01/20 Last fill 04/29/20  #10/0

## 2020-06-07 ENCOUNTER — Other Ambulatory Visit: Payer: Self-pay

## 2020-06-07 ENCOUNTER — Ambulatory Visit (INDEPENDENT_AMBULATORY_CARE_PROVIDER_SITE_OTHER): Payer: Self-pay

## 2020-06-07 DIAGNOSIS — J4540 Moderate persistent asthma, uncomplicated: Secondary | ICD-10-CM

## 2020-06-07 DIAGNOSIS — J4550 Severe persistent asthma, uncomplicated: Secondary | ICD-10-CM | POA: Diagnosis not present

## 2020-06-08 ENCOUNTER — Telehealth: Payer: Self-pay

## 2020-06-08 NOTE — Telephone Encounter (Signed)
Okay to send in a refill of Ryclora.  I have discussed with the patient that we have run out of nasal sprays and antihistamine options for incomplete control of his nasal symptoms. If he has had improvement in his breathing then he may be eligible for allergy shots

## 2020-06-08 NOTE — Telephone Encounter (Signed)
Patient called complaining of a studdy nose and headaches. Patient also states that he is out of his Ryclora solution and needs a refill. Patient states that he is taking all of his medications and it has yet to help with nasal congestion completely.

## 2020-06-09 MED ORDER — DEXCHLORPHENIRAMINE MALEATE 2 MG/5ML PO SOLN: 5.0000 mL | ORAL | 2 refills | Status: DC | PRN

## 2020-06-09 NOTE — Telephone Encounter (Signed)
Called to inform patient that the Ryclora has been sent to the [facility] pharmacy on [address]. Tried to leave a message but patients mailbox is full.

## 2020-06-09 NOTE — Telephone Encounter (Signed)
Patient informed of medication being sent.

## 2020-06-10 ENCOUNTER — Telehealth: Payer: Self-pay | Admitting: *Deleted

## 2020-06-10 NOTE — Telephone Encounter (Signed)
Patient called and stated that the [facility] will be faxing over paperwork for [place] to fill out to continue his insurance coverage and disability. I advised that we will keep a look out for it. He stated that he had to reschedule his Methacoline challenge since he was sick and not feeling well. I advised for him to call and get that rescheduled. Patient assured that he would.

## 2020-06-10 NOTE — Telephone Encounter (Signed)
FYI

## 2020-06-10 NOTE — Telephone Encounter (Signed)
Ok thanks.   Once I get the [facility] paperwork before I will complete them again I need to know the reschedule date for the methacholine challenge.

## 2020-06-13 ENCOUNTER — Other Ambulatory Visit: Payer: Self-pay | Admitting: Nurse Practitioner

## 2020-06-13 DIAGNOSIS — I10 Essential (primary) hypertension: Secondary | ICD-10-CM

## 2020-06-14 NOTE — Telephone Encounter (Signed)
Called and spoke with patient.  Informed patient that papers from [facility] had been received, but Dr. Delorse Lek needed the date that his Methacholine challenge had been rescheduled.  Patient states he canceled his first challenge because he was not feeling well.  Patient states he can't reschedule yet because his has an issue with his insurance.  Patient is also requesting samples of Ryclora because he was unable to pay for his prescription until he gets his insurance issue fixed.  Patient informed I would check on samples of Ryclora and send update to Dr. Delorse Lek.

## 2020-06-15 ENCOUNTER — Telehealth: Payer: BC Managed Care – PPO | Admitting: Nurse Practitioner

## 2020-06-20 ENCOUNTER — Telehealth: Payer: Self-pay | Admitting: Allergy & Immunology

## 2020-06-20 MED ORDER — PREDNISONE 20 MG PO TABS: 20.0000 mg | ORAL_TABLET | Freq: Two times a day (BID) | ORAL | 0 refills | Status: AC

## 2020-06-20 NOTE — Telephone Encounter (Signed)
Patient called complaining of "chest congestion". He had some prednisone left at his house and asked whether he could take it. He reported using all of his nasal sprays and inhalers as prescribed.   He had one 50mg  prednisone and one 20mg  prednisone tablet left. I instructed him to take the 50mg  tonight (the evening of 06/19/20) and then the 20mg  this morning (the morning of 06/19/20). I told him I would send in another few days of prednisone and let his allergist - Dr. - know that I talked to him.  06/21/20, MD [facility]

## 2020-06-20 NOTE — Telephone Encounter (Signed)
Thanks for update

## 2020-06-23 NOTE — Telephone Encounter (Signed)
Dondra Spry from [facility] [employer]ability wanted to know if our office received paperwork for patient. Informed Dondra Spry that we did receive paperwork, but Dr. Delorse Lek was waiting for patient to reschedule methacholine challenge and provide the office with a date. Dondra Spry states the patient was waiting on results from a COVID test. Dondra Spry wanted to know if we could go ahead and send over the medical records portion of the paperwork.   Patient also called right behind Dondra Spry and wanted to know why we have not sent the papers back to [facility].   Gail's direct number at [place] is [phone] if we have any questions.

## 2020-06-24 NOTE — Telephone Encounter (Signed)
Called and spoke with patient.  He is still without insurance and still has not been able to reschedule Methacholine Challenge.  Informed patient we do not have any samples of Ryclora and we do not have a local representative at this time for samples  I did call the district representative who will fax a form for [place] to send for samples to get mailed to the office, but they have limited samples at this time.  We are still waiting on that form. Patient states he is still waiting on his results from his Covid testing done 06/22/20.  Currently his wife is positive and kids show symptoms of being positive.  Informed patient I would update Dr. Delorse Lek and see if medical records could get sent to [facility].  Informed patient that there would not be anything new Dr. Delorse Lek could add to the forms since he had not had the challenge done.  Patient voiced understanding.

## 2020-06-24 NOTE — Telephone Encounter (Signed)
Spoke with patient again and he had received Covid test results.  Patient is positive for Covid.  Patient does have Blood Pressure cuff, thermometer and pulse oximeter.  Patient states his oxygen level is 98%.  Patient does have multivitamin, Vitamin D and Zinc at the house and is taking them daily.  Patient will increase Vitamin D and Zinc to twice a day.  Patient will finish out the Prednisone he was taking and informed he does not need to stay on continuous Prednisone.  Patient does not have anyone outside of the house that can pick up samples.  The samples of Karbinal or if we have Ryclora will be discussed at later date after patient and family have been released from isolation and all are well from Covid.  Patient is aware to contact office if oxygen level drops below 92-90%.  Patient voiced understanding.

## 2020-06-24 NOTE — Telephone Encounter (Signed)
I have received his paperwork and will complete as of is last visit.

## 2020-06-24 NOTE — Telephone Encounter (Signed)
Dr. Delorse Lek updated and okay to send forms on for medical records to be sent.  Dr. Delorse Lek will complete [employer] Disability forms as done in the past.  Okay for Kashaun to use Karbinal samples until we get Ryclora due to circumstance of not having insurance.  Patient will need to get someone outside of his household to pick up samples since they all have been exposed and/or Covid positive. Dondra Spry with [employer] notified medical records and form completion is being worked on. Patient notified.

## 2020-06-24 NOTE — Telephone Encounter (Signed)
Patient called back and had additional information for [facility] but did not tell me what information that was. Please contact patient.

## 2020-06-27 NOTE — Telephone Encounter (Signed)
Beth had sent me the records request.  I went ahead and faxed all medical records within the request and received the fax confirmation that it went through.

## 2020-06-29 NOTE — Telephone Encounter (Signed)
Disability forms have been filled out and will be faxed to the [facility] at [phone]

## 2020-06-29 NOTE — Telephone Encounter (Signed)
Disability forms have been faxed.

## 2020-06-29 NOTE — Telephone Encounter (Signed)
Called to let patient know that his papers have been faxed. Tried to leave message but mailbox is full.

## 2020-07-01 ENCOUNTER — Telehealth: Payer: Self-pay | Admitting: *Deleted

## 2020-07-01 NOTE — Telephone Encounter (Addendum)
Patient called the office returning automated reminder call he received for injection appointment next week.  Spoke with patient and received update on how he was doing since having Covid.  Patient still has productive cough on occasion.  Patient states wife and kids are doing much better.  Patient does have his  Methacholine Challenge rescheduled for 07/15/20.  Patient states Leanna Sato is wanting him to have this done by 08/07/20. Informed patient we are not able to get any samples of Ryclora due to the company being low on inventory and we do not have a representative for our area at this time.  Per Dr. Delorse Lek, he can get Karbinal samples when he is able to come to the office for his next Nucala injection.  Patient will be provided a sample of Nucala for next time only until he can get his insurance situation straightened out.  Patient states Nucala helps and Dr. Delorse Lek has helped him stay out of the Urgent Care.  Nucala injection appointment changed to 07/07/20 and patient will with update next week.  Dr. Delorse Lek updated.

## 2020-07-04 ENCOUNTER — Telehealth: Payer: Self-pay

## 2020-07-04 ENCOUNTER — Telehealth: Payer: Self-pay | Admitting: Allergy

## 2020-07-04 NOTE — Telephone Encounter (Signed)
Schedule video appt

## 2020-07-04 NOTE — Telephone Encounter (Signed)
Pt states he is post COVID x 2 weeks and would like to know if he could get an antibiotic for the swollen glands in his neck. Pt states as his day goes on he has a nagging cough also. Please advise.

## 2020-07-04 NOTE — Telephone Encounter (Signed)
Patient called around 11:30 PM on Friday, July 01, 2020.  He states that he noticed some tenderness and swelling of his neck.  He is wondering if that is making his coughing worse.  Recently diagnosed with COVID-19.  Denies any issues with swallowing foods or liquids.  No respiratory compromise.  He has not tried to take anything for this.  Discussed with patient that it is difficult for me to say what is causing this without an actual physical exam.  He may try some over-the-counter pain medication such as NSAIDs and acetaminophen for the pain.  Patient states he is going to try cold compresses first to the neck to see if it improves the swelling.   If worsening symptoms or no improvement I recommend in person evaluation.

## 2020-07-04 NOTE — Telephone Encounter (Signed)
Appointment scheduled.

## 2020-07-05 ENCOUNTER — Ambulatory Visit: Payer: Self-pay

## 2020-07-06 ENCOUNTER — Ambulatory Visit: Payer: Self-pay | Admitting: Allergy

## 2020-07-06 ENCOUNTER — Ambulatory Visit (INDEPENDENT_AMBULATORY_CARE_PROVIDER_SITE_OTHER): Payer: Managed Care, Other (non HMO) | Admitting: Allergy

## 2020-07-06 ENCOUNTER — Encounter: Payer: Self-pay | Admitting: Allergy

## 2020-07-06 ENCOUNTER — Other Ambulatory Visit: Payer: Self-pay

## 2020-07-06 VITALS — BP 124/80 | HR 85 | Temp 97.9°F | Resp 14 | Ht 69.0 in | Wt 175.2 lb

## 2020-07-06 DIAGNOSIS — R12 Heartburn: Secondary | ICD-10-CM

## 2020-07-06 DIAGNOSIS — R059 Cough, unspecified: Secondary | ICD-10-CM

## 2020-07-06 DIAGNOSIS — J4541 Moderate persistent asthma with (acute) exacerbation: Secondary | ICD-10-CM | POA: Diagnosis not present

## 2020-07-06 DIAGNOSIS — J302 Other seasonal allergic rhinitis: Secondary | ICD-10-CM

## 2020-07-06 DIAGNOSIS — H1013 Acute atopic conjunctivitis, bilateral: Secondary | ICD-10-CM | POA: Diagnosis not present

## 2020-07-06 DIAGNOSIS — U071 COVID-19: Secondary | ICD-10-CM

## 2020-07-06 DIAGNOSIS — J3089 Other allergic rhinitis: Secondary | ICD-10-CM

## 2020-07-06 DIAGNOSIS — H1010 Acute atopic conjunctivitis, unspecified eye: Secondary | ICD-10-CM

## 2020-07-06 DIAGNOSIS — U099 Post covid-19 condition, unspecified: Secondary | ICD-10-CM

## 2020-07-06 MED ORDER — RYCLORA 2 MG/5ML PO SOLN: 5.0000 mL | ORAL | 0 refills | Status: DC | PRN

## 2020-07-06 MED ORDER — EPINEPHRINE 0.3 MG/0.3ML IJ SOAJ: INTRAMUSCULAR | 2 refills | Status: AC

## 2020-07-06 MED ORDER — MONTELUKAST SODIUM 10 MG PO TABS: ORAL_TABLET | ORAL | 1 refills | Status: DC

## 2020-07-06 MED ORDER — BREZTRI AEROSPHERE 160-9-4.8 MCG/ACT IN AERO: 2.0000 | INHALATION_SPRAY | Freq: Two times a day (BID) | RESPIRATORY_TRACT | 3 refills | Status: DC

## 2020-07-06 MED ORDER — AZELASTINE HCL 0.1 % NA SOLN: NASAL | 5 refills | Status: DC

## 2020-07-06 MED ORDER — IPRATROPIUM-ALBUTEROL 0.5-2.5 (3) MG/3ML IN SOLN: 3.0000 mL | Freq: Four times a day (QID) | RESPIRATORY_TRACT | 0 refills | Status: DC | PRN

## 2020-07-06 MED ORDER — QVAR REDIHALER 80 MCG/ACT IN AERB
2.0000 | INHALATION_SPRAY | Freq: Two times a day (BID) | RESPIRATORY_TRACT | 3 refills | Status: DC
Start: 2020-07-06 — End: 2021-05-25

## 2020-07-06 MED ORDER — ALBUTEROL SULFATE HFA 108 (90 BASE) MCG/ACT IN AERS: 1.0000 | INHALATION_SPRAY | Freq: Four times a day (QID) | RESPIRATORY_TRACT | 1 refills | Status: DC | PRN

## 2020-07-06 MED ORDER — AZITHROMYCIN 250 MG PO TABS: ORAL_TABLET | ORAL | 0 refills | Status: DC

## 2020-07-06 NOTE — Progress Notes (Signed)
Follow-up Note  RE: Jordan [name] MRN: [id number] DOB: [birth year] Date of Office Visit: 07/06/2020   History of present illness: Jordan [name] is a 45 y.o. male presenting today for sick visit. He was last seen as a televisit on 04/13/2020.  He had Covid and tested positive approximately 1 2 weeks ago (has completed his isolation) and so did several household family members who have recovered. He reported symptoms of rattling cough, muscle aches, headache, nasal congestion.  He states he noticed some neck swelling last Friday and tenderness.  He states he was using heat pads.  He was taking his pulse ox and states the lowest was around 96%.  He states he has days that fluctuate where he has more congestion and drainage and cough where may need to use albuterol as frequent as every hour.  And then he reports days where he does not need to use it as much.  He feels like he has flareups currently are more sensitive to being triggered than previous.  He has been on out breztri for the past week.  He continues on Nucala injections and does report this is helping him as he has been able to stay out of the UC/ED since being on Nucala.  He would like to continue on Nucala.  He has had several prednisone burst since last visit.  One being around 06/20/20 where he was having chest congestion.  He is just finishing up another prednisone course today as they did not seem to be as effective as it typically has been in the past.  He ran out of ryclora and currently without insurance thus not able to get further ryclora.  We unfortunately do not have any ryclora samples in office.  Otherwise he states he has declined his medications as prescribed which also includes Qvar, Singulair, nasal Atrovent and azelastine.  He also performs nasal saline rinses.  He has not been able to get pantoprazole filled thus he has been off of this as well. He is scheduled for methacholine challenge on 07/15/20.    He has not had the Covid  vaccine.    Review of systems: Review of Systems  Constitutional: Positive for fever and malaise/fatigue.  HENT: Positive for congestion and sinus pain.   Eyes: Negative.   Respiratory: Positive for cough and shortness of breath.   Cardiovascular: Negative.   Musculoskeletal: Positive for myalgias.  Skin: Negative.   Neurological: Positive for headaches.    All other systems negative unless noted above in HPI  Past medical/social/surgical/family history have been reviewed and are unchanged unless specifically indicated below.  No changes  Medication List: Current Outpatient Medications  Medication Sig Dispense Refill  . amLODipine (NORVASC) 5 MG tablet Take 1 tablet (5 mg total) by mouth daily. 90 tablet 3  . azithromycin (ZITHROMAX) 250 MG tablet Take 2 pills on day one and one pill on day two, three, four, and five. 6 tablet 0  . Dexchlorpheniramine Maleate (RYCLORA) 2 MG/5ML SOLN Take 5 ml every 4-6 hours as needed 1800 mL 1  . DULoxetine (CYMBALTA) 20 MG capsule Take 1 capsule (20 mg total) by mouth daily. 30 capsule 5  . EPIPEN 2-PAK 0.3 MG/0.3ML SOAJ injection INJECT 0.3 MLS INTO MUSCLE    . fenofibrate (TRICOR) 145 MG tablet Take 1 tablet (145 mg total) by mouth daily. 90 tablet 1  . ibuprofen (ADVIL) 800 MG tablet Take 1 tablet (800 mg total) by mouth 3 (three) times daily. 21 tablet 0  .  ipratropium (ATROVENT) 0.06 % nasal spray USE 2 SPRAYS IN EACH NOSTRIL THREE TIMES DAILY    . ipratropium (ATROVENT) 0.06 % nasal spray Place 2 sprays into both nostrils 3 (three) times daily.    . Mepolizumab (NUCALA [place]) Inject into the skin.    . naproxen (NAPROSYN) 500 MG tablet Take 1 tablet (500 mg total) by mouth 2 (two) times daily with a meal. 60 tablet 2  . sildenafil (VIAGRA) 50 MG tablet TAKE 1 TABLET(50 MG) BY MOUTH DAILY AS NEEDED FOR ERECTILE DYSFUNCTION 10 tablet 0  . Spacer/Aero-Hold Chamber Mask MISC 1 each by Other route 3 times/day as needed-between meals & bedtime. 1  each 0  . albuterol (PROAIR HFA) 108 (90 Base) MCG/ACT inhaler Inhale 1-2 puffs into the lungs every 6 (six) hours as needed for wheezing or shortness of breath. 18 g 1  . azelastine (ASTELIN) 0.1 % nasal spray 2 sprays per nostril 2 times daily as needed for drainage. 30 mL 5  . beclomethasone (QVAR REDIHALER) 80 MCG/ACT inhaler Inhale 2 puffs into the lungs 2 (two) times daily. Rinse mouth out after each use. 21.2 g 3  . Budeson-Glycopyrrol-Formoterol (BREZTRI AEROSPHERE) 160-9-4.8 MCG/ACT AERO Inhale 2 puffs into the lungs in the morning and at bedtime. 32.1 g 3  . Dexchlorpheniramine Maleate (RYCLORA) 2 MG/5ML SOLN Take 5 mLs by mouth every 4 (four) hours as needed. 280 mL 0  . EPINEPHrine 0.3 mg/0.3 mL IJ SOAJ injection INJECT 0.3 MLS INTO MUSCLE 1 each 2  . ipratropium-albuterol (DUONEB) 0.5-2.5 (3) MG/3ML SOLN Take 3 mLs by nebulization every 6 (six) hours as needed (wheezing, shortness of breath, chest tightness). 360 mL 0  . montelukast (SINGULAIR) 10 MG tablet TAKE 1 TABLET BY MOUTH EVERYDAY AT BEDTIME 90 tablet 1  . ondansetron (ZOFRAN) 8 MG tablet TAKE 1 TABLET (8 MG TOTAL) BY MOUTH EVERY 8 (EIGHT) HOURS AS NEEDED FOR NAUSEA OR VOMITING. (Patient not taking: Reported on 07/06/2020) 18 tablet 0  . pantoprazole (PROTONIX) 40 MG tablet Take 1 tablet (40 mg total) by mouth daily. (Patient not taking: Reported on 07/06/2020) 30 tablet 5   Current Facility-Administered Medications  Medication Dose Route Frequency Provider Last Rate Last Admin  . Mepolizumab SOLR 100 mg  100 mg Subcutaneous Q28 days Marcelyn Bruins, MD   100 mg at 07/06/20 1452     Known medication allergies: No Known Allergies   Physical examination: Blood pressure 124/80, pulse 85, temperature 97.9 F (36.6 C), resp. rate 14, height 5\' 9"  (1.753 m), weight 175 lb 3.2 oz (79.5 kg), SpO2 98 %.  General: Alert, interactive, in no acute distress. HEENT: PERRLA, TMs pearly gray, turbinates markedly edematous without  discharge, post-pharynx non erythematous. Neck: Supple with tender cervical lymphadenopathy mobile. Lungs: Clear to auscultation without wheezing, rhonchi or rales. {no increased work of breathing. CV: Normal S1, S2 without murmurs. Abdomen: Nondistended, nontender. Skin: Warm and dry, without lesions or rashes. Extremities:  No clubbing, cyanosis or edema. Neuro:   Grossly intact.  Diagnositics/Labs:  Spirometry: FEV1: 2.46L 73%, FVC: 3.15L 76% predicted.  Slightly reduced for his demographics.  He reports using albuterol about an hour and a half prior to the study  Assessment and plan:    Asthma exacerbation due to viral Covid illness:  . Daily controller medication(s):  o Continue Breztri 2 puffs twice a day and Qvar 2 puffs twice a day with spacer and rinse mouth afterwards. o Continue Singulair 10mg  daily at night.  . Prior to  physical activity: May use albuterol rescue inhaler 2 puffs 5 to 15 minutes prior to strenuous physical activities. Marland Kitchen Rescue medications: May use albuterol rescue inhaler 2 puffs or nebulizer every 4 to 6 hours as needed for shortness of breath, chest tightness, coughing, and wheezing. Monitor frequency of use. . Continue Nucala injections monthly for now.  Provided with a sample today . Methacholine challenge scheduled in next week . Take Azithromycin 2 tabs day 1 then 1 tab day 2-5.  This has anti-inflammatory properties as well as antibiotic properties . Provided with samples of Mucinex DM which is a mucus thinning product and has cough suppressant.  Take with tall glass of water. . Control goals:  o Full participation in all desired activities (may need albuterol before activity) o Albuterol use two times or less a week on average (not counting use with activity) o Cough interfering with sleep two times or less a month o Oral steroids no more than once a year o No hospitalization  Heartburn  Continue pantoprazole 20mg  in the morning. Nothing to eat  or drink for 30 minutes.  Seasonal and perennial allergic rhinoconjunctivitis Past history - 2021 skin testing was positive to grass, tree, mold, dust mites.   Continued nasal congestion  Continue nasal saline rinses  Continue with nasal Atrovent 2 sprays each nostril up to 3-4 times a day as needed for nasal congestion and drainage  Continue nasal Asteline 2 sprays each nostril twice a day for nasal drainage  2022 has not been effective  Provided with karbinal samples to replace Ryclora until able to get Ryclora refilled.  Take karbinal 56ml twice a day  Continue Singulair  10mg  nightly  If you are having acute worsening of symptoms, please go to ER/urgent care. If pulse ox is staying consistently below 92% recommend ER evaluation Recommend you receive the Covid vaccine.   Follow up in 3-4 months or sooner if needed  I appreciate the opportunity to take part in Rayn's care. Please do not hesitate to contact me with questions.  Sincerely,   9m, MD Allergy/Immunology [facility]

## 2020-07-06 NOTE — Patient Instructions (Addendum)
Asthma:  . Daily controller medication(s):  o Continue Breztri 2 puffs twice a day and Qvar 2 puffs twice a day with spacer and rinse mouth afterwards. o Continue Singulair 10mg  daily at night.  . Prior to physical activity: May use albuterol rescue inhaler 2 puffs 5 to 15 minutes prior to strenuous physical activities. Rescue medications: May use albuterol rescue inhaler 2 puffs or nebulizer every 4 to 6 hours as needed for shortness of breath, chest tightness, coughing, and wheezing. Monitor frequency of use. . Continue Nucala injections monthly for now.  Provided with a sample today . Methacholine challenge scheduled in next week . Take Azithromycin 2 tabs day 1 then 1 tab day 2-5.  This has anti-inflammatory properties as well as antibiotic properties . Provided with samples of Mucinex DM which is a mucus thinning product and has cough suppressant.  Take with tall glass of water. . Control goals:  o Full participation in all desired activities (may need albuterol before activity) o Albuterol use two times or less a week on average (not counting use with activity) o Cough interfering with sleep two times or less a month o Oral steroids no more than once a year o No hospitalization  Heartburn  Continue pantoprazole 20mg  in the morning. Nothing to eat or drink for 30 minutes.  Seasonal and perennial allergic rhinoconjunctivitis Past history - 2021 skin testing was positive to grass, tree, mold, dust mites.   Continued nasal congestion  Continue nasal saline rinses  Continue with nasal Atrovent 2 sprays each nostril up to 3-4 times a day as needed for nasal congestion and drainage  Continue nasal Asteline 2 sprays each nostril twice a day for nasal drainage  has not been effective  Provided with karbinal samples to replace Ryclora until able to get Ryclora refilled.  Take karbinal 40ml twice a day  Continue Singulair  10mg  nightly  If you are having acute worsening of  symptoms, please go to ER/urgent care. If pulse ox is staying consistently below 92% recommend ER evaluation Recommend you receive the Covid vaccine.   Follow up in 3-4 months or sooner if needed

## 2020-07-07 ENCOUNTER — Ambulatory Visit: Payer: Self-pay

## 2020-07-15 ENCOUNTER — Telehealth: Payer: Self-pay | Admitting: *Deleted

## 2020-07-15 DIAGNOSIS — R0602 Shortness of breath: Secondary | ICD-10-CM | POA: Diagnosis not present

## 2020-07-15 NOTE — Telephone Encounter (Signed)
Received call from [facility] at [facility].  Confirming status that was written on last forms submitted by Dr. Delorse Lek.  Confirming that patent would not have any update or any additional new information to be provided until he had Methacholine Challenge done today.  Per Dondra Spry, Jordan [name] stated she speak with me and that he did not need challenge. Confirmed that per Dr. Delorse Lek and as stated in all previous notes and visits, patient still needs Methacholine Challenge done.  Informed Dondra Spry that patient did have an office visit and injection with Dr. Delorse Lek since forms were completed, but it was stressed at that visit for him to keep his appointment for his challenge on 07/15/20. One of the sample medications given at time of visit was to last him until he had challenge done and Dr. Delorse Lek received results.  Dr. Delorse Lek notified of call.

## 2020-07-18 ENCOUNTER — Telehealth: Payer: Self-pay

## 2020-07-18 NOTE — Telephone Encounter (Signed)
Patient called complaining of chest tightness and shortness of breath after his recent visit for the methacholine challenge 07/15/20. Patient has not went to urgent care or the emergency room for symptoms

## 2020-07-18 NOTE — Telephone Encounter (Signed)
Spoke with patient and informed him we have not received the results of his Methacholine challenge test yet.  Patient stated his breathing did go down and had to receive two breathing treatments during the testing.  Patient has feeling he needs to cough up mucus and can't.  Patient is taking Breztri and Qvar as directed.  Instructed to use Albuterol inhaler 2 puffs for shortness of breath, chest tightness or coughing.  Patient does not have anymore Mucinex DM.  He has one bottle of Karbinal and out of Dexilant.   Informed patient I could give him some samples of Mucinex DM to take with 8 oz of water.  1 additional sample of Karbinal and 1 additional sample of Dexilant.  Confirmed that he was totally out of Dexilant and he stated yes, since there were only 3 in bottle.  I informed patient that there were 5 capsules in each Dexilant and shake bottle well and to check to make sure he gets all 5 capsules out.  Informed him Dr. Delorse Lek would be updated and to seek urgent care if he worsened.  Otherwise, no change in care until Methacholine results are released unless Dr. Delorse Lek states otherwise.  Patient voiced understanding and will pick up samples.

## 2020-07-18 NOTE — Telephone Encounter (Signed)
Thanks [employer].  Yes agree with that.  Use albuterol as needed.  Continue his routine medications.  Will await results of methacholine challenge.

## 2020-07-20 DIAGNOSIS — R0602 Shortness of breath: Secondary | ICD-10-CM | POA: Diagnosis not present

## 2020-07-21 ENCOUNTER — Telehealth: Payer: Self-pay | Admitting: *Deleted

## 2020-07-21 ENCOUNTER — Telehealth: Payer: Self-pay

## 2020-07-21 NOTE — Telephone Encounter (Signed)
Disability forms from [facility] have been received in office.  Forms are requesting results from Methacholine Challenge done 07/15/20 and we do not have the results yet.  Forms are wanting treatment plan from Dr. Delorse Lek and she is unable to send or complete forms until she gets the results from Methacholine Challenge.

## 2020-07-21 NOTE — Telephone Encounter (Signed)
Would recommend he call Dr. Ledell Peoples office for formal results.  I have not yet seen any results yet and it is not yet in Epic to review.

## 2020-07-21 NOTE — Telephone Encounter (Signed)
Spoke with patient, he stated that he already received the results but are wondering what they mean. He is asking that once Dr. Delorse Lek reviews them if we can call him back to let him know. Please advise.

## 2020-07-21 NOTE — Telephone Encounter (Signed)
Please advise 

## 2020-07-21 NOTE — Telephone Encounter (Signed)
Ok thanks.  Can do that once I receive the results.

## 2020-07-21 NOTE — Telephone Encounter (Signed)
Patient called stating he did a Methacholine Challenge on last Friday and he is wondering if the results are back yet?  Please Advise.

## 2020-07-25 NOTE — Telephone Encounter (Signed)
[place] called the office today to inform Dr. Delorse Lek he requested the results of his Methacholine Challenge be released to Dr. Delorse Lek.  Results are available in Epic now. Informed patient I would let Dr. Delorse Lek know he called.  Patient stated [employer] Disability Forms are due 08/07/2020 and he is aware forms can't be completed until Dr. Delorse Lek reviews Methacholine Challenge.  Patient voiced understanding.

## 2020-07-26 NOTE — Telephone Encounter (Signed)
Please let pt know I reviewed the available results in the EMR which does show he had some evidence of airway hyperresponsiveness which is a characteristic of asthma.  Some of the features of the test appeared to be no interpretable.  However since there was hyperresponsiveness then recommend he continue on his current asthma regimen.   He is maxxed out on inhaler dosage and on monthly Nucala which he should continue.     --------------------------- Will complete forms once back in [place] office. Please place in my box.

## 2020-07-26 NOTE — Telephone Encounter (Signed)
I can always re-refer him to another pulmonologist for another opinion if he would like to do so. I think that would be the next best option here to have another set of eyes in involved.     I am at a loss as to why he is so symptomatic on all the asthma medication he is on.   He reports Nucala does seem to help but I would expect him to have better symptom control than he does.  Would continue for now with samples until he has insurance and can think about changing options.        I have not seen any follow-up visits with Dr. Elijah Birk at [facility] either.   Does he have any scheduled?

## 2020-07-26 NOTE — Telephone Encounter (Signed)
Called and spoke with patient.  Informed patient of results and recommendation per Dr. Delorse Lek.  Informed patient [employer] forms will be completed placed in Dr. Randell Patient office for her to complete when she is in the [place] office.  Patient states he is having to use albuterol at night every 4 hours and it wakes him up with feeling of chest tightness and he has to cough something up.  Patient states he can't cough up anything.  Informed patient I would send message to Dr. Delorse Lek and to continue his current medication regimen as previously discussed.

## 2020-07-27 NOTE — Telephone Encounter (Signed)
Patient called and would like for Beth to call him. [phone].

## 2020-07-27 NOTE — Telephone Encounter (Signed)
Spoke with patient and he doesn't want to get re-referred to another pulmonologist. He only wants to see you. He also does not have a follow up visit with Dr. Elijah Birk and doesn't want to follow up with him either.

## 2020-07-29 NOTE — Telephone Encounter (Addendum)
Patient called the office on 07/28/20 and wanted to discuss the recommendations from Dr. Delorse Lek.  Informed patient that per Dr. Delorse Lek, she did not suggest any change in his medication and to continue Nucala injections.  If patient continues to have symptoms, he needs to be to seen by pulmonologist.  Discussed with patient that if he did not want to see who he had seen in the past, Dr. Delorse Lek could refer him to another pulmonary doctor.  Patient likes Dr. Elijah Birk and the staff, but he has transportation issues.  [place] practice is closer, but he did not like the provider there as much due to the lack of time spent with him.   Patient states he talked to Dr. Elijah Birk on 07/28/20 and was told to take Symbicort.  Patient did not think Dr. Elijah Birk was clear on Breztri.  I informed patient the note was not visible in Epic and I could not confirm.  Informed patient I would pass information on to Dr. Delorse Lek and would inform her he is willing to follow up with pulmonologist.

## 2020-07-31 ENCOUNTER — Ambulatory Visit (INDEPENDENT_AMBULATORY_CARE_PROVIDER_SITE_OTHER): Payer: BC Managed Care – PPO

## 2020-07-31 ENCOUNTER — Ambulatory Visit
Admission: EM | Admit: 2020-07-31 | Discharge: 2020-07-31 | Disposition: A | Discharge status: Home / Self Care | Payer: BC Managed Care – PPO

## 2020-07-31 DIAGNOSIS — J019 Acute sinusitis, unspecified: Secondary | ICD-10-CM

## 2020-07-31 DIAGNOSIS — R0602 Shortness of breath: Secondary | ICD-10-CM | POA: Diagnosis not present

## 2020-07-31 DIAGNOSIS — J4540 Moderate persistent asthma, uncomplicated: Secondary | ICD-10-CM

## 2020-07-31 MED ORDER — BENZONATATE 200 MG PO CAPS: 200.0000 mg | ORAL_CAPSULE | Freq: Three times a day (TID) | ORAL | 0 refills | Status: AC | PRN

## 2020-07-31 MED ORDER — PREDNISONE 50 MG PO TABS: 50.0000 mg | ORAL_TABLET | Freq: Every day | ORAL | 0 refills | Status: DC

## 2020-07-31 MED ORDER — DM-GUAIFENESIN ER 30-600 MG PO TB12: 1.0000 | ORAL_TABLET | Freq: Two times a day (BID) | ORAL | 0 refills | Status: DC

## 2020-07-31 MED ORDER — AMOXICILLIN-POT CLAVULANATE 875-125 MG PO TABS: 1.0000 | ORAL_TABLET | Freq: Two times a day (BID) | ORAL | 0 refills | Status: DC

## 2020-07-31 NOTE — Discharge Instructions (Signed)
Chest Xray normal Begin Augmentin twice daily x 1 week Prednisone daily for 5 days-take with food and earlier in the day if possible May supplement Mucinex DM for cough and congestion as needed Tessalon every 8 hours for cough Follow-up with asthma/allergist for further treatment evaluation of persistent asthma symptoms

## 2020-07-31 NOTE — ED Provider Notes (Signed)
[name]    CSN: [id number] Arrival date & time: 07/31/20  1404      History   Chief Complaint Chief Complaint  Patient presents with  . Asthma    HPI Jordan [name] is a 45 y.o. male history of asthma presenting today for evaluation of asthma flare.  Reports that he had COVID in January 2022, since he has had persistent issues with his asthma including shortness of breath chest tightness and wheezing.  He denies any chest pain.  He has been following up with his asthma specialist and has not had much improvement with his regular prescribed inhalers/medicines.  Reports 2 weeks ago performed methacholine challenge and symptoms have worsened since then.  He was on a Z-Pak early in February, denies other antibiotics during the course of symptoms.  He has not been on oral prednisone or steroids over the course of symptoms.  Ports a lot of associated sinus symptoms as well which have been persistent.   HPI  Past Medical History:  Diagnosis Date  . Asthma   . Reflux esophagitis     Patient Active Problem List   Diagnosis Date Noted  . GAD (generalized anxiety disorder) 04/01/2020  . Elevated LFTs 07/29/2019  . Elevated LDL cholesterol level 07/29/2019  . SOB (shortness of breath) 07/27/2019  . Seasonal and perennial allergic rhinoconjunctivitis 07/20/2019  . Asthma 07/20/2019  . Heartburn 07/20/2019  . Stress and adjustment reaction 07/14/2019  . Healthcare maintenance 07/14/2019  . Hypertension 07/14/2019    Past Surgical History:  Procedure Laterality Date  . NO PAST SURGERIES         Home Medications    Prior to Admission medications   Medication Sig Start Date End Date Taking? Authorizing Provider  albuterol (PROAIR HFA) 108 (90 Base) MCG/ACT inhaler Inhale 1-2 puffs into the lungs every 6 (six) hours as needed for wheezing or shortness of breath. 07/06/20  Yes Padgett, Pilar Grammes, MD  amLODipine (NORVASC) 5 MG tablet Take 1 tablet (5 mg total) by  mouth daily. 03/09/20  Yes Nche, Bonna Gains, NP  amoxicillin-clavulanate (AUGMENTIN) 875-125 MG tablet Take 1 tablet by mouth every 12 (twelve) hours. 07/31/20  Yes Brynn Mulgrew C, PA-C  azelastine (ASTELIN) 0.1 % nasal spray 2 sprays per nostril 2 times daily as needed for drainage. 07/06/20  Yes Padgett, Pilar Grammes, MD  benzonatate (TESSALON) 200 MG capsule Take 1 capsule (200 mg total) by mouth 3 (three) times daily as needed for up to 7 days for cough. 07/31/20 08/07/20 Yes Ranee Peasley C, PA-C  Budeson-Glycopyrrol-Formoterol (BREZTRI AEROSPHERE) 160-9-4.8 MCG/ACT AERO Inhale 2 puffs into the lungs in the morning and at bedtime. 07/06/20  Yes Padgett, Pilar Grammes, MD  budesonide-formoterol [facility]) 160-4.5 MCG/ACT inhaler Inhale 2 puffs into the lungs 2 (two) times daily. 07/28/20 10/26/20 Yes [provider]  dextromethorphan-guaiFENesin (MUCINEX DM) 30-600 MG 12hr tablet Take 1 tablet by mouth 2 (two) times daily. 07/31/20  Yes Cadynce Garrette C, PA-C  ipratropium (ATROVENT) 0.06 % nasal spray USE 2 SPRAYS IN EACH NOSTRIL THREE TIMES DAILY 08/07/19  Yes [provider]  ipratropium (ATROVENT) 0.06 % nasal spray Place 2 sprays into both nostrils 3 (three) times daily. 08/07/19  Yes [provider]  montelukast (SINGULAIR) 10 MG tablet TAKE 1 TABLET BY MOUTH EVERYDAY AT BEDTIME 07/06/20  Yes Padgett, Pilar Grammes, MD  predniSONE (DELTASONE) 50 MG tablet Take 1 tablet (50 mg total) by mouth daily with breakfast. 07/31/20  Yes Demarkis Gheen, [place] C, PA-C  azithromycin (ZITHROMAX) 250 MG tablet Take 2 pills on day one and one pill on day two, three, four, and five. 07/06/20   Marcelyn Bruins, MD  beclomethasone (QVAR REDIHALER) 80 MCG/ACT inhaler Inhale 2 puffs into the lungs 2 (two) times daily. Rinse mouth out after each use. 07/06/20   Marcelyn Bruins, MD  Dexchlorpheniramine [facility]) 2 MG/5ML SOLN Take 5 ml every 4-6 hours as needed 12/24/19    Marcelyn Bruins, MD  Dexchlorpheniramine Male[facility]) 2 MG/5ML SOLN Take 5 mLs by mouth every 4 (four) hours as needed. 07/06/20   Marcelyn Bruins, MD  DULoxetine (CYMBALTA) 20 MG capsule Take 1 capsule (20 mg total) by mouth daily. 04/04/20   Nche, Bonna Gains, NP  EPINEPHrine 0.3 mg/0.3 mL IJ SOAJ injection INJECT 0.3 MLS INTO MUSCLE 07/06/20   Marcelyn Bruins, MD  EPIPEN 2-PAK 0.3 MG/0.3ML SOAJ injection INJECT 0.3 MLS INTO MUSCLE 08/28/19   [provider]  fenofibrate (TRICOR) 145 MG tablet Take 1 tablet (145 mg total) by mouth daily. 03/10/20   Nche, Bonna Gains, NP  ibuprofen (ADVIL) 800 MG tablet Take 1 tablet (800 mg total) by mouth 3 (three) times daily. 07/30/19   Hall-Potvin, [place], PA-C  ipratropium-albuterol (DUONEB) 0.5-2.5 (3) MG/3ML SOLN Take 3 mLs by nebulization every 6 (six) hours as needed (wheezing, shortness of breath, chest tightness). 07/06/20   Marcelyn Bruins, MD  Mepolizumab [facility]) Inject into the skin.    [provider]  naproxen (NAPROSYN) 500 MG tablet Take 1 tablet (500 mg total) by mouth 2 (two) times daily with a meal. 09/15/19   Nche, Bonna Gains, NP  ondansetron (ZOFRAN) 8 MG tablet TAKE 1 TABLET (8 MG TOTAL) BY MOUTH EVERY 8 (EIGHT) HOURS AS NEEDED FOR NAUSEA OR VOMITING. Patient not taking: Reported on 07/06/2020 02/26/20   Marcelyn Bruins, MD  pantoprazole (PROTONIX) 40 MG tablet Take 1 tablet (40 mg total) by mouth daily. Patient not taking: Reported on 07/06/2020 04/13/20   Marcelyn Bruins, MD  sildenafil (VIAGRA) 50 MG tablet TAKE 1 TABLET(50 MG) BY MOUTH DAILY AS NEEDED FOR ERECTILE DYSFUNCTION 06/01/20   Nche, Bonna Gains, NP  Spacer/Aero-Hold Chamber Mask MISC 1 each by Other route 3 times/day as needed-between meals & bedtime. 07/25/19   Alfonse Spruce, MD    Family History Family History  Problem Relation Age of Onset  . Hypertension Mother   . Glaucoma Mother 20   . Hypertension Father     Social History Social History   Tobacco Use  . Smoking status: Never Smoker  . Smokeless tobacco: Never Used  Vaping Use  . Vaping Use: Never used  Substance Use Topics  . Alcohol use: Yes    Comment: occ  . Drug use: No     Allergies   Patient has no known allergies.   Review of Systems Review of Systems  Constitutional: Negative for activity change, appetite change, chills, fatigue and fever.  HENT: Positive for congestion, rhinorrhea, sinus pressure and sore throat. Negative for ear pain and trouble swallowing.   Eyes: Negative for discharge and redness.  Respiratory: Positive for cough, shortness of breath and wheezing. Negative for chest tightness.   Cardiovascular: Negative for chest pain.  Gastrointestinal: Negative for abdominal pain, diarrhea, nausea and vomiting.  Musculoskeletal: Negative for myalgias.  Skin: Negative for rash.  Neurological: Negative for dizziness, light-headedness and headaches.     Physical Exam Triage Vital Signs ED Triage Vitals  Enc  Vitals Group     BP      Pulse      Resp      Temp      Temp src      SpO2      Weight      Height      Head Circumference      Peak Flow      Pain Score      Pain Loc      Pain Edu?      Excl. in GC?    No data found.  Updated Vital Signs BP (!) 146/101 (BP Location: Right Arm)   Pulse 86   Temp 98.9 F (37.2 C) (Oral)   Resp 18   SpO2 97%   Visual Acuity Right Eye Distance:   Left Eye Distance:   Bilateral Distance:    Right Eye Near:   Left Eye Near:    Bilateral Near:     Physical Exam Vitals and nursing note reviewed.  Constitutional:      Appearance: He is well-developed and well-nourished.     Comments: No acute distress  HENT:     Head: Normocephalic and atraumatic.     Ears:     Comments: Bilateral ears without tenderness to palpation of external auricle, tragus and mastoid, EAC's without erythema or swelling, TM's with good bony  landmarks and cone of light. Non erythematous.     Nose: Nose normal.     Mouth/Throat:     Comments: Oral mucosa pink and moist, no tonsillar enlargement or exudate. Posterior pharynx patent and nonerythematous, no uvula deviation or swelling. Normal phonation. Eyes:     Conjunctiva/sclera: Conjunctivae normal.  Cardiovascular:     Rate and Rhythm: Normal rate.  Pulmonary:     Effort: Pulmonary effort is normal. No respiratory distress.     Comments: Breathing comfortably at rest, CTABL, no wheezing, rales or other adventitious sounds auscultated Abdominal:     General: There is no distension.  Musculoskeletal:        General: Normal range of motion.     Cervical back: Neck supple.  Skin:    General: Skin is warm and dry.  Neurological:     Mental Status: He is alert and oriented to person, place, and time.  Psychiatric:        Mood and Affect: Mood and affect normal.      UC Treatments / Results  Labs (all labs ordered are listed, but only abnormal results are displayed) Labs Reviewed - No data to display  EKG   Radiology DG Chest 2 View  Result Date: 07/31/2020 CLINICAL DATA:  Shortness of breath. EXAM: CHEST - 2 VIEW COMPARISON:  July 07, 2019 FINDINGS: The heart size and mediastinal contours are within normal limits. Both lungs are clear. The visualized skeletal structures are unremarkable. IMPRESSION: No active cardiopulmonary disease. Electronically Signed   By: Aram Candela M.D.   On: 07/31/2020 15:21    Procedures Procedures (including critical care time)  Medications Ordered in UC Medications - No data to display  Initial Impression / Assessment and Plan / UC Course  I have reviewed the triage vital signs and the nursing notes.  Pertinent labs & imaging results that were available during my care of the patient were reviewed by me and considered in my medical decision making (see chart for details).     Chest x-ray normal, no signs of pneumonia or  nodules, treating with Augmentin to cover sinusitis  as well as escalating to 5-day course of oral prednisone, continue symptomatic and supportive care of congestion and cough, follow-up with asthma specialist for further monitoring and management of asthma/cough.  Discussed strict return precautions. Patient verbalized understanding and is agreeable with plan.  Final Clinical Impressions(s) / UC Diagnoses   Final diagnoses:  Acute sinusitis with symptoms > 10 days  Moderate persistent asthma without complication     Discharge Instructions     Chest Xray normal Begin Augmentin twice daily x 1 week Prednisone daily for 5 days-take with food and earlier in the day if possible May supplement Mucinex DM for cough and congestion as needed Tessalon every 8 hours for cough Follow-up with asthma/allergist for further treatment evaluation of persistent asthma symptoms    ED Prescriptions    Medication Sig Dispense Auth. Provider   amoxicillin-clavulanate (AUGMENTIN) 875-125 MG tablet Take 1 tablet by mouth every 12 (twelve) hours. 14 tablet Mahkayla Preece C, PA-C   predniSONE (DELTASONE) 50 MG tablet Take 1 tablet (50 mg total) by mouth daily with breakfast. 5 tablet Fitzpatrick Alberico C, PA-C   dextromethorphan-guaiFENesin (MUCINEX DM) 30-600 MG 12hr tablet Take 1 tablet by mouth 2 (two) times daily. 20 tablet Terrina Docter C, PA-C   benzonatate (TESSALON) 200 MG capsule Take 1 capsule (200 mg total) by mouth 3 (three) times daily as needed for up to 7 days for cough. 28 capsule Gonsalo Cuthbertson, [place] C, PA-C     PDMP not reviewed this encounter.   Lew DawesWieters, Nolyn Eilert C, [place]PA-C 07/31/20 1611

## 2020-07-31 NOTE — ED Triage Notes (Addendum)
Pt states he has been evaluated by multiple providers for recurrent asthma and lung functions. Pt states he has COVID in January. Had acute asthmatic SOB flare up during lung function tests approx 2 weeks ago and it has "been worse every since then".  Reports tan drainage from left ear.  Pt c/o SOB at rest, congestion, sinus pressure to forehead and nasal area.  Pt here today to get medications or "something to break up the congestion in face and nose, and lungs".  Pt able to speak full/long sentences w/o difficulty. Last used symbicort inhaler this morning, last used rescue inhaler last night.  Bilateral lung sounds CTA with slight diminished exchange to bases. Denies fever, sore throat, n/v/d, ear pain.

## 2020-08-04 ENCOUNTER — Telehealth: Payer: Self-pay | Admitting: *Deleted

## 2020-08-04 NOTE — Telephone Encounter (Signed)
Dr. Delorse Lek completed and signed short term disability papers.  Completed papers faxed to [facility] [employer]ability at [phone].

## 2020-08-04 NOTE — Telephone Encounter (Signed)
Please place a new [facility] pulmonary referral (pt does not want to see Dr. Tonia Brooms again ---this is who he saw originally).   Would prefer either Dr. Chestine Spore, Dr. Celine Mans, Dr. Everardo All or Dr. Jayme Cloud at the [facility] location.  Referral reason: not controlled asthma on ICS/LABA/LAMA and biologic therapy not improving.  Methacholine challenge done 2.18.22 that is not conclusive.

## 2020-08-08 NOTE — Telephone Encounter (Signed)
Patient called the office wanting to confirm disability papers were faxed to [facility] and to discuss his medications.  Patient wanted to know what the forms stated.  Informed him I did fax them on 08/04/20 and they were sent to our scan center to be scanned into his medical record.  Informed patient I did not read the disability papers, but knew Dr. Delorse Lek put he had Methacholine Challenge done and her recommendation for him to follow up with pulmonary physician.  Patient had questions regarding Symbicort and informed him Dr. Delorse Lek was in agreement with recommendation of Dr. Elijah Birk since he made change after Methacholine Challenge.  Patient gave update regarding recent sinusitis and taking Augmentin and Prednisone.  Patient complained of getting short of breath after stopping Prednisone and felt Albuterol did not totally help.  Reviewed with patient this went back to the importance of pulmonary provider being involved in his evaluation and care.  Also reviewed again the fact that there are side effects and long term effects of taking Prednisone. Patient voiced understanding and will wait for appointment with pulmonologist in [place]. Patient did state he has inusrance with Rosann Auerbach now.  Appointment made for Nucala injection for 08/09/20 at 5:30 pm and sample will be used. Will get insurance information from patient when he is at the office at time of injection.  Note sent to Tammy and will send insurance information when obtained for approval for Nucala.   Dr. Delorse Lek updated of patient call to office.

## 2020-08-08 NOTE — Telephone Encounter (Signed)
Pt called and asked for Beth to call him back.

## 2020-08-09 ENCOUNTER — Other Ambulatory Visit: Payer: Self-pay

## 2020-08-09 ENCOUNTER — Ambulatory Visit (INDEPENDENT_AMBULATORY_CARE_PROVIDER_SITE_OTHER): Payer: Managed Care, Other (non HMO) | Admitting: *Deleted

## 2020-08-09 DIAGNOSIS — J4550 Severe persistent asthma, uncomplicated: Secondary | ICD-10-CM | POA: Diagnosis not present

## 2020-08-09 DIAGNOSIS — J4540 Moderate persistent asthma, uncomplicated: Secondary | ICD-10-CM

## 2020-08-09 NOTE — Telephone Encounter (Signed)
Thanks.  Agree with everything you relayed to Mr. Riegler.

## 2020-08-11 ENCOUNTER — Telehealth: Payer: Self-pay

## 2020-08-11 NOTE — Telephone Encounter (Signed)
I spoke with Selena Batten @ [facility]. Their office has to send the new provider a request to move the patient over to another provider.  Once a provider agrees to see the patient, they will contact the patient to get scheduled.  I will call the patient & let him know this information.

## 2020-08-11 NOTE — Telephone Encounter (Signed)
Called and spoke with patient and he stated that the [facility] faxed over a form needing further information? This form has been placed in Dr. Randell Patient box. He is concerned thinking that the Leanna Sato is thinking he can come back to work on April 1st and not return with light duty? I told him I was not fully aware but that I would speak with [facility] and Dr. Delorse Lek to further figure things out. Patient verbalized understanding.

## 2020-08-11 NOTE — Telephone Encounter (Signed)
Patient called requesting to speak with Jordan [name]  Patient didn't go into detail.  Please Advise

## 2020-08-11 NOTE — Telephone Encounter (Signed)
Please place a [facility] pulmonary referral (pt does not want to see Dr. Tonia Brooms again ---this is who he saw originally).   Would prefer either Dr. Chestine Spore, Dr. Celine Mans, Dr. Everardo All or Dr. Jayme Cloud at the [facility] location.  Referral reason: not controlled asthma on ICS/LABA/LAMA and biologic therapy not improving.  Methacholine challenge done 2.18.22 that is not conclusive.

## 2020-08-12 NOTE — Telephone Encounter (Signed)
Letter has been printed and signed. OV notes have been printed. Patient was only seen once since 04/27/20 which was 07/06/20, patient canceled visit's between time. Papers have been faxed to appropriate parties.

## 2020-08-12 NOTE — Telephone Encounter (Signed)
His disability company is requesting further information from 04/27/2020.  If we can fax them the office visit note(s) from 04/27/2020 to date.  They are also requesting the results of the methacholine challenge test.  We did not perform this test thus they should contact Dr. Ledell Peoples office at [facility] for these results.  Please make the following and to a letter and fax it with the office visits.  ------------------------------------------------------- To whom it may concern:  Jordan [name] is being referred back to pulmonology for further evaluation as I, as an allergist, have no further medications to offer him outside of what he is already taking for improvement in his symptoms (these are outlined in the office notes).  The methacholine challenge test per the report appears to be inconclusive in regards to his asthma diagnosis.  This is the reasoning for further evaluation and management with pulmonology to determine if there is something else driving his symptoms.  He also continues to follow with Dr. Elijah Birk, academic allergist at [facility], who ordered and had the methacholine testing done.  For the formal diagnostic test results of the methacholine challenge recommend you obtain this from the ordering physician, Dr. Elijah Birk.  He also has been providing recommendations for [name].    Best,    Margo Aye, MD [facility]

## 2020-08-13 ENCOUNTER — Telehealth: Payer: Self-pay | Admitting: Allergy & Immunology

## 2020-08-13 MED ORDER — SUCRALFATE 1 G PO TABS: 1.0000 g | ORAL_TABLET | Freq: Three times a day (TID) | ORAL | 3 refills | Status: DC

## 2020-08-13 NOTE — Telephone Encounter (Signed)
I received a call from Mr. Jordan [name].  He tells me that he has been using Delsym for his cough and that has been working pretty well.  He ran out of it and his cough has gotten worse.  He is on omeprazole and is actually been taking double the amount of that.  Yesterday around 4 PM he ate a meal and then he had worsening respiratory symptoms (it is difficult for me to figure out what exactly this consists of because he is not the best historian).  Regardless, his symptoms continued until 2 AM.  He is wondering if he can take Maalox in addition to the omeprazole.  I told him that was fine.  I also told him I would send in Carafate to see if that would calm his reflux down.  Confirmed pharmacy and sent in Carafate.  Malachi Bonds, MD [facility]

## 2020-08-15 NOTE — Telephone Encounter (Signed)
Called and spoke with patient.  Informed patient that Dr. Delorse Lek dictated letter and requested office notes were faxed to [facility].   Patient stated he called provider on call this weekend and was now taking Carafate.  Patient was concerned he needed something else for reflux.  Patient just started medication yesterday afternoon and advised patient to give Carafate time to work.  Reviewed instructions from Dr. Dellis Anes to take Carafate 4 times a day with meals and at bedtime.  Patient voiced understanding.

## 2020-08-16 ENCOUNTER — Telehealth: Payer: Self-pay | Admitting: Pulmonary Disease

## 2020-08-16 NOTE — Telephone Encounter (Signed)
Thank you :)

## 2020-08-16 NOTE — Telephone Encounter (Signed)
Need to verify msg and inform pt of protocol when changing providers- LMTCB.

## 2020-08-17 ENCOUNTER — Other Ambulatory Visit: Payer: Self-pay | Admitting: Nurse Practitioner

## 2020-08-17 DIAGNOSIS — N529 Male erectile dysfunction, unspecified: Secondary | ICD-10-CM

## 2020-08-17 NOTE — Telephone Encounter (Signed)
Jordan [name],  I just wanted your office to have this information from Dr Delorse Lek for the reason why the patient wants to switch and what he is needing to be seen for. I sent this information to [facility] before, but I wanted to make sure the provider sees this information.  Thank You    From Dr Delorse Lek:   Please place a [facility] pulmonary referral (patient does not want to see Dr. Tonia Brooms again ---this is who he saw originally).  Would prefer either Dr. Chestine Spore, Dr. Celine Mans, Dr. Everardo All or Dr. Jayme Cloud at the [facility] location.  Referral reason: not controlled asthma on ICS/LABA/LAMA and biologic therapy not improving. Methacholine challenge done 2.18.22 that is not conclusive.

## 2020-08-17 NOTE — Telephone Encounter (Signed)
Agreed. Whoever is available is fine with me.   Josephine Igo, DO [facility] 08/17/2020 3:47 PM

## 2020-08-17 NOTE — Telephone Encounter (Signed)
Dr Tonia Brooms- please see below msg per Dr Jamse Arn and advise if okay to switch to Dr Celine Mans or Everardo All, thanks

## 2020-08-18 NOTE — Telephone Encounter (Signed)
Refill request for:  Viagra 50 mg LR 06/01/20, #10, 0 rf  Finofivrate 145 mg  LR 03/10/20,  #90, 1 rf  LOV 04/01/20 FOV  09/08/20  Please review and advise.  Thanks. Dm/cma

## 2020-08-18 NOTE — Telephone Encounter (Signed)
LMTCB for the pt- when he calls back please schedule appt 30 min with any MD

## 2020-08-19 MED ORDER — SILDENAFIL CITRATE 50 MG PO TABS: 50.0000 mg | ORAL_TABLET | ORAL | 1 refills | Status: DC | PRN

## 2020-08-23 NOTE — Telephone Encounter (Signed)
Dr. Celine Mans or Dr. Everardo All, please advise if ok to take this pt on. Thanks.

## 2020-08-24 ENCOUNTER — Telehealth: Payer: Self-pay

## 2020-08-24 NOTE — Telephone Encounter (Signed)
Pt called and states his Rx for viagra is $200 for 10 tabs and after speaking with the pharmacy he could do a dose of 20 mg dose with the same quantity for only $15. Pt states he would like to know if Rx could be changed. Pt states he thinks the 50 mg may be causing headaches so it might be good to go down in the dosage. Please advise.

## 2020-08-24 NOTE — Telephone Encounter (Signed)
Sure that is fine.

## 2020-08-24 NOTE — Telephone Encounter (Signed)
Spoke with pt and notified him that office wanted to schedule consult with Dr. Emmit Alexanders. Pt stated he would have to call back to scheduled appointment r/t being unsure of transportation at this time. Front desk when pt calls back please schedule 30 min consult appointment with Dr. Celine Mans. Nothing further needed at this time.

## 2020-08-25 NOTE — Telephone Encounter (Signed)
Just a update on his Referral to Pulmonary:    Marylynn Pearson, RN   9:40 AM Note Spoke with pt and notified him that office wanted to schedule consult with Dr. Emmit Alexanders. Pt stated he would have to call back to scheduled appointment r/t being unsure of transportation at this time. Front desk when pt calls back please schedule 30 min consult appointment with Dr. Celine Mans. Nothing further needed at this time.       Thanks

## 2020-08-25 NOTE — Telephone Encounter (Signed)
He had a recent ED visit with elevated BP. Help him schedule OV  to re eval BP prior to providing additional viagra refill. Thank you

## 2020-08-25 NOTE — Telephone Encounter (Signed)
Thanks Dee!

## 2020-08-26 NOTE — Telephone Encounter (Signed)
Spoke to patient and advised him that he will have to either wait till his appt on 4/14 or call and schedule sooner before any changes on the meds.  He will keep the appt he has now. Dm/cma

## 2020-08-26 NOTE — Telephone Encounter (Signed)
Spoke to patient and advised him that he will have to either wait till his appt on 4/14 or call and schedule sooner before any changes on the meds.  He will keep the appt he has now. Dm/cma 

## 2020-08-30 ENCOUNTER — Telehealth: Payer: Self-pay | Admitting: *Deleted

## 2020-08-30 NOTE — Telephone Encounter (Signed)
Called patient and advised new approval with new Ins and will need to send Nucala to different pharmacy [facility].  Advised patient to locate copay card he was mailed Jan 2022 to give to [facility] for $0 copay for same

## 2020-09-05 ENCOUNTER — Telehealth: Payer: Self-pay | Admitting: Allergy

## 2020-09-05 NOTE — Telephone Encounter (Signed)
Patient states he is having a lot of nasal congestion which is making him nauseous in the mornings. Patient states he is taking sucralfate 4 times a day. Patient would like a nurse to give him a call back to discuss.

## 2020-09-05 NOTE — Telephone Encounter (Signed)
Called and spoke with patient and he is using Sucralfate and Astelin nasal spray. Please advise.

## 2020-09-06 ENCOUNTER — Other Ambulatory Visit: Payer: Self-pay

## 2020-09-06 ENCOUNTER — Ambulatory Visit (INDEPENDENT_AMBULATORY_CARE_PROVIDER_SITE_OTHER): Payer: Managed Care, Other (non HMO) | Admitting: *Deleted

## 2020-09-06 DIAGNOSIS — J4550 Severe persistent asthma, uncomplicated: Secondary | ICD-10-CM

## 2020-09-06 DIAGNOSIS — J4540 Moderate persistent asthma, uncomplicated: Secondary | ICD-10-CM

## 2020-09-06 NOTE — Telephone Encounter (Signed)
Is he also using nasal Atrovent as needed for the congestion and drainage?   He can use 2 sprays each nostril up to 3-4 times a day as needed for nasal congestion and drainage

## 2020-09-06 NOTE — Telephone Encounter (Signed)
Informed patient when he came into the office. He will try to take it more frequently as prescribed and will call if he is not having relief.

## 2020-09-07 ENCOUNTER — Ambulatory Visit: Payer: BC Managed Care – PPO | Admitting: Nurse Practitioner

## 2020-09-07 ENCOUNTER — Telehealth: Payer: Self-pay | Admitting: Nurse Practitioner

## 2020-09-07 NOTE — Telephone Encounter (Signed)
Called patient back in regards to the MyChart message sent regarding a change in his appointment due to the provider being out, pt asked for a return call from the MA. 2nd attempt at calling him and still unable to reach patient.

## 2020-09-07 NOTE — Telephone Encounter (Signed)
Patient states that he's returning a call to the nurse (did not see a message). Please give him a call back.

## 2020-09-08 ENCOUNTER — Ambulatory Visit: Payer: BC Managed Care – PPO | Admitting: Nurse Practitioner

## 2020-09-13 ENCOUNTER — Telehealth: Payer: Self-pay | Admitting: Allergy

## 2020-09-13 MED ORDER — MONTELUKAST SODIUM 10 MG PO TABS: ORAL_TABLET | ORAL | 1 refills | Status: DC

## 2020-09-13 NOTE — Telephone Encounter (Signed)
Left a message for pt to return call 

## 2020-09-13 NOTE — Telephone Encounter (Signed)
Taking neb, nasal sprays, was taking Karbinal liquid sample but ins doesn't cover- will try calling pharmacy with copay card and he says he can not breath through nose and unable to eat and unable to focus. Needs refills of Singulair took last one last night. Sucralfate (Carafate) is upsetting stomach but I told him to try taking it with food on his stomach and if that doesn't help he will call us back to let us know.   Montelukast refills sent to [facility] [address].

## 2020-09-13 NOTE — Telephone Encounter (Signed)
Pt called asking for a nurse to give him a call back, pt did not give any other information.   Please advise.

## 2020-09-19 NOTE — Telephone Encounter (Signed)
Patient has an appointment scheduled with Dr. Celine Mans for 10/25/20 at 2:30 pm.  Nothing further needed.

## 2020-09-27 ENCOUNTER — Telehealth: Payer: Self-pay | Admitting: *Deleted

## 2020-09-27 NOTE — Telephone Encounter (Signed)
Called patient to advise Nucala Rx ready at [facility] and they have been trying to reach patient to ok to ship.  Patient advises change in Ins eff May 1 to [employer].  I advised him that we would need to d/c Rx at [facility], get new auth with new Ins and send to [facility] to reach out to him to ok ship.  Also advised cancelled appt for next week since his medication will not be there and no sample on hand.

## 2020-10-04 ENCOUNTER — Ambulatory Visit: Payer: Self-pay

## 2020-10-06 ENCOUNTER — Telehealth: Payer: Self-pay | Admitting: *Deleted

## 2020-10-06 NOTE — Telephone Encounter (Signed)
Spoke with patient on 10/05/20 when he called the [facility] injection room to set up an appointment for his next Nucala injection.  Patient has a different insurance coverage and we are waiting on insurance approval.  We do not have any Nucala samples.   Called our [employer] and he will come by the office on 10/06/20 for provider signature so we can get more samples in the office.  Our [place] Office had 1 Nucala sample and will send over and I will hold for patient until he gets his medication authorized. Tammy notified. Called patient and left a voicemail message on 10/06/20 to call the office to set up his appointment for Nucala injection.  A sample will be used at that time.  When patient calls the office, please set him for Nucala injection.

## 2020-10-06 NOTE — Telephone Encounter (Signed)
Called and spoke with patient. He has been scheduled to come in next Tuesday 10/11/2020 at 6:00pm for his Nucala injection.

## 2020-10-06 NOTE — Telephone Encounter (Signed)
Received records request from [employer] for the patient asking for office notes and physical examination findings from 08/11/2020 to 10/05/2020. The patient has not been seen during that time frame. Per Dr. Randell Patient last instruction she wants him to see Pulmonology for evaluation. Patient is currently scheduled for 10/25/20 to see the pulmonologist for a new patient appointment. A letter has been typed stating as such. Called patient and advised. Patient verbalized understanding. Letter along with [facility] request has been faxed to [phone], it has also been labeled and placed in bulk scanning.

## 2020-10-11 ENCOUNTER — Ambulatory Visit (INDEPENDENT_AMBULATORY_CARE_PROVIDER_SITE_OTHER): Payer: 59 | Admitting: *Deleted

## 2020-10-11 ENCOUNTER — Other Ambulatory Visit: Payer: Self-pay

## 2020-10-11 ENCOUNTER — Telehealth: Payer: Self-pay | Admitting: Allergy

## 2020-10-11 DIAGNOSIS — J4550 Severe persistent asthma, uncomplicated: Secondary | ICD-10-CM | POA: Diagnosis not present

## 2020-10-11 DIAGNOSIS — J4540 Moderate persistent asthma, uncomplicated: Secondary | ICD-10-CM

## 2020-10-11 NOTE — Telephone Encounter (Signed)
Patient is coming in today for a nucala injection, patient would like to know if he could get any sample of mucinex or karbinal if available.  Please advise.

## 2020-10-11 NOTE — Telephone Encounter (Signed)
Samples placed in shot room waiting for pt to pick up and pt informed

## 2020-10-13 ENCOUNTER — Telehealth: Payer: Self-pay

## 2020-10-13 NOTE — Telephone Encounter (Signed)
Pt feels like something is in back of his throat and in his chest. Not coughing up any thing. Doing all meds inahlers and nasal sprays and mucinex, and nasal saline. Started last week before his nucala injection. What else can he do to help this. He said he does not want prednisone unless that is the last results he believes it might need a sinus infection and might need a antibiotic.

## 2020-10-13 NOTE — Telephone Encounter (Signed)
Please inform Jordan [name] that he really needs to come in either an the morning or the afternoon tomorrow to have this issue assessed.  It is not good medicine to perform telephone care when there is a persistent issue present.  Please make an appointment for him in [facility] tomorrow morning or tomorrow afternoon.

## 2020-10-13 NOTE — Telephone Encounter (Signed)
Patient called back, unfortunately we do not have any openings tomorrow. I was able to speak to Dr. Delorse Lek & she suggested for him to go to an urgent care to be seen or to call his primary care physician. I relayed this to the patient, and he verbalized understanding. Patient will call back with any concerns.

## 2020-10-13 NOTE — Telephone Encounter (Signed)
Lm for pt to call us back about getting on the schedule

## 2020-10-14 NOTE — Telephone Encounter (Signed)
Left voicemail on patient's mobile phone stating we did have some openings today with Chrissie if he has not already been seen.

## 2020-10-14 NOTE — Telephone Encounter (Signed)
Since Chrissie is here today I talked with her about this pt.  If he would like a visit to evaluate his throat can put on her schedule for today.

## 2020-10-17 ENCOUNTER — Telehealth: Payer: Self-pay | Admitting: Allergy

## 2020-10-17 ENCOUNTER — Other Ambulatory Visit: Payer: Self-pay | Admitting: *Deleted

## 2020-10-17 MED ORDER — BREZTRI AEROSPHERE 160-9-4.8 MCG/ACT IN AERO: 2.0000 | INHALATION_SPRAY | Freq: Two times a day (BID) | RESPIRATORY_TRACT | 5 refills | Status: DC

## 2020-10-17 MED ORDER — MONTELUKAST SODIUM 10 MG PO TABS: ORAL_TABLET | ORAL | 5 refills | Status: DC

## 2020-10-17 MED ORDER — ALBUTEROL SULFATE HFA 108 (90 BASE) MCG/ACT IN AERS: 1.0000 | INHALATION_SPRAY | Freq: Four times a day (QID) | RESPIRATORY_TRACT | 1 refills | Status: DC | PRN

## 2020-10-17 NOTE — Telephone Encounter (Signed)
Called patient and left voicemail message to call the office.

## 2020-10-17 NOTE — Telephone Encounter (Signed)
Called and spoke with the patient and he states that he has tested positive for COVID again this past Saturday 10/15/2020. He states that his cough is more prevalent and he feels light headed and congested. He is out of his Albuterol, Breztri, and Montelukast and needs them refilled. He is wondering if he can take something extra or have something sent in to help prevent his symptoms from getting worse. His refills have been sent in to the [facility] on [address].

## 2020-10-17 NOTE — Telephone Encounter (Signed)
Patient requested a call back from [place]. Patient did not go into further details.

## 2020-10-17 NOTE — Telephone Encounter (Signed)
Error

## 2020-10-18 ENCOUNTER — Telehealth: Payer: Self-pay | Admitting: Allergy & Immunology

## 2020-10-18 NOTE — Telephone Encounter (Signed)
Lm for pt to call us back about this. Did he have a positive test if so we can send him to antiviral meds.

## 2020-10-18 NOTE — Telephone Encounter (Signed)
This is a late entry.  Jordan [name] called me last night complaining of cold-like symptoms.  He was exposed to a COVID-19 positive person within the last 24 hours.  He was wondering what kind of medicines he should use.  I recommended that he use of Mucinex with decongestion every 4-6 hours.  He can also try the extended release versions which are every 12 hours.  I also recommended that he use a cough suppressant with dextromethorphan in it, often abbreviated DM on the medication packages.  I emphasized that he needed to test himself 3 to 5 days after exposure so that he did not get a false negative.  I told him we did not prescribe antivirals if there was no positive test.   Patient in agreement with the plan.  Malachi Bonds, MD [facility] [place]

## 2020-10-18 NOTE — Telephone Encounter (Signed)
I am a bit confused from the messages.  Per Dr. Ellouise Newer on call appears he was exposed to Covid + person but had not tested.   Another message says he tested positive on 10/15/20.    If he indeed had a covid test and was positive on Saturday and he is symptomatic then he is within the 5 day window to initiate Paxlovid, antiviral medication, to help reduce severity of covid illness.    If he has no history of kidney disease and took a covid test and was positive within the past 5 days then if he would like to try antiviral medication we can send that in.

## 2020-10-21 ENCOUNTER — Ambulatory Visit: Payer: Self-pay | Admitting: Nurse Practitioner

## 2020-10-25 ENCOUNTER — Ambulatory Visit: Payer: Self-pay | Admitting: Internal Medicine

## 2020-10-26 ENCOUNTER — Telehealth: Payer: Self-pay | Admitting: Allergy & Immunology

## 2020-10-26 ENCOUNTER — Telehealth: Payer: Self-pay

## 2020-10-26 NOTE — Telephone Encounter (Signed)
Patient called and would like to talk with a nurse about his congestion in his chest. [phone].

## 2020-10-26 NOTE — Telephone Encounter (Signed)
I called the patient to go over his chest congestion. He stated that he took another COVID test 2 days ago. It came back negative. He is not sure if the karbinal and mucinex helped him. He said he did try cough medication with DM and that also did not help. He was offered an appointment for next Friday with Thurston Hole, but declined he said it needed to be around 4-5pm due to transportation issues.

## 2020-10-26 NOTE — Telephone Encounter (Signed)
Per Diandra.

## 2020-10-27 MED ORDER — DOXYCYCLINE MONOHYDRATE 100 MG PO TABS: 100.0000 mg | ORAL_TABLET | Freq: Two times a day (BID) | ORAL | 0 refills | Status: DC

## 2020-10-27 NOTE — Telephone Encounter (Signed)
Called and spoke to patient and informed him of the message from Dr.Gallagher.Patient stated that he would take the medications. I have sent the Doxycycline 100 mg to his pharmacy.

## 2020-10-27 NOTE — Telephone Encounter (Signed)
We can send in a round of antibiotics for him if he wants: Doxycycline 100 mg twice daily for 1 week.  Malachi Bonds, MD [facility] [place] [facility]

## 2020-10-27 NOTE — Addendum Note (Signed)
Addended by: Robet Leu A on: 10/27/2020 06:28 PM   Modules accepted: Orders

## 2020-11-02 ENCOUNTER — Other Ambulatory Visit: Payer: Self-pay

## 2020-11-02 ENCOUNTER — Encounter: Payer: Self-pay | Admitting: Nurse Practitioner

## 2020-11-02 ENCOUNTER — Ambulatory Visit (INDEPENDENT_AMBULATORY_CARE_PROVIDER_SITE_OTHER): Payer: 59 | Admitting: Nurse Practitioner

## 2020-11-02 ENCOUNTER — Other Ambulatory Visit (HOSPITAL_COMMUNITY)
Admission: RE | Admit: 2020-11-02 | Discharge: 2020-11-02 | Disposition: A | Discharge status: Home / Self Care | Payer: 59 | Source: Ambulatory Visit | Attending: Nurse Practitioner | Admitting: Nurse Practitioner

## 2020-11-02 VITALS — BP 140/90 | HR 76 | Temp 98.3°F | Ht 68.75 in | Wt 170.6 lb

## 2020-11-02 DIAGNOSIS — F411 Generalized anxiety disorder: Secondary | ICD-10-CM

## 2020-11-02 DIAGNOSIS — I10 Essential (primary) hypertension: Secondary | ICD-10-CM | POA: Diagnosis not present

## 2020-11-02 DIAGNOSIS — N529 Male erectile dysfunction, unspecified: Secondary | ICD-10-CM | POA: Diagnosis not present

## 2020-11-02 DIAGNOSIS — E291 Testicular hypofunction: Secondary | ICD-10-CM

## 2020-11-02 DIAGNOSIS — E782 Mixed hyperlipidemia: Secondary | ICD-10-CM

## 2020-11-02 DIAGNOSIS — M25551 Pain in right hip: Secondary | ICD-10-CM

## 2020-11-02 LAB — URINALYSIS
Bilirubin Urine: NEGATIVE
Hgb urine dipstick: NEGATIVE
Ketones, ur: NEGATIVE
Leukocytes,Ua: NEGATIVE
Nitrite: NEGATIVE
Specific Gravity, Urine: 1.015 (ref 1.000–1.030)
Total Protein, Urine: NEGATIVE
Urine Glucose: NEGATIVE
Urobilinogen, UA: 0.2 (ref 0.0–1.0)
pH: 5.5 (ref 5.0–8.0)

## 2020-11-02 MED ORDER — AMLODIPINE BESYLATE 5 MG PO TABS: 5.0000 mg | ORAL_TABLET | Freq: Every day | ORAL | 3 refills | Status: DC

## 2020-11-02 MED ORDER — AMLODIPINE BESYLATE 10 MG PO TABS: 10.0000 mg | ORAL_TABLET | Freq: Every day | ORAL | 3 refills | Status: DC

## 2020-11-02 MED ORDER — ESCITALOPRAM OXALATE 10 MG PO TABS: 10.0000 mg | ORAL_TABLET | Freq: Every day | ORAL | 5 refills | Status: DC

## 2020-11-02 NOTE — Patient Instructions (Addendum)
Start hip exercise and use tylenol as needed for pain  Go to lab for blood draw and urine collection  Increase amlodipine to 10mg  for hypertension  Hip Exercises Ask your health care provider which exercises are safe for you. Do exercises exactly as told by your health care provider and adjust them as directed. It is normal to feel mild stretching, pulling, tightness, or discomfort as you do these exercises. Stop right away if you feel sudden pain or your pain gets worse. Do not begin these exercises until told by your health care provider. Stretching and range-of-motion exercises These exercises warm up your muscles and joints and improve the movement and flexibility of your hip. These exercises also help to relieve pain, numbness, and tingling. You may be asked to limit your range of motion if you had a hip replacement. Talk to your health care provider about these restrictions. Hamstrings, supine 1. Lie on your back (supine position). 2. Loop a belt or towel over the ball of your left / right foot. The ball of your foot is on the walking surface, right under your toes. 3. Straighten your left / right knee and slowly pull on the belt or towel to raise your leg until you feel a gentle stretch behind your knee (hamstring). ? Do not let your knee bend while you do this. ? Keep your other leg flat on the floor. 4. Hold this position for __________ seconds. 5. Slowly return your leg to the starting position. Repeat __________ times. Complete this exercise __________ times a day.   Hip rotation 1. Lie on your back on a firm surface. 2. With your left / right hand, gently pull your left / right knee toward the shoulder that is on the same side of the body. Stop when your knee is pointing toward the ceiling. 3. Hold your left / right ankle with your other hand. 4. Keeping your knee steady, gently pull your left / right ankle toward your other shoulder until you feel a stretch in your  buttocks. ? Keep your hips and shoulders firmly planted while you do this stretch. 5. Hold this position for __________ seconds. Repeat __________ times. Complete this exercise __________ times a day.   Seated stretch This exercise is sometimes called hamstrings and adductors stretch. 1. Sit on the floor with your legs stretched wide. Keep your knees straight during this exercise. 2. Keeping your head and back in a straight line, bend at your waist to reach for your left foot (position A). You should feel a stretch in your right inner thigh (adductors). 3. Hold this position for __________ seconds. Then slowly return to the upright position. 4. Keeping your head and back in a straight line, bend at your waist to reach forward (position B). You should feel a stretch behind both of your thighs and knees (hamstrings). 5. Hold this position for __________ seconds. Then slowly return to the upright position. 6. Keeping your head and back in a straight line, bend at your waist to reach for your right foot (position C). You should feel a stretch in your left inner thigh (adductors). 7. Hold this position for __________ seconds. Then slowly return to the upright position. Repeat __________ times. Complete this exercise __________ times a day.   Lunge This exercise stretches the muscles of the hip (hip flexors). 1. Place your left / right knee on the floor and bend your other knee so that is directly over your ankle. You should be half-kneeling. 2.  Keep good posture with your head over your shoulders. 3. Tighten your buttocks to point your tailbone downward. This will prevent your back from arching too much. 4. You should feel a gentle stretch in the front of your left / right thigh and hip. If you do not feel a stretch, slide your other foot forward slightly and then slowly lunge forward with your chest up until your knee once again lines up over your ankle. ? Make sure your tailbone continues to point  downward. 5. Hold this position for __________ seconds. 6. Slowly return to the starting position. Repeat __________ times. Complete this exercise __________ times a day.   Strengthening exercises These exercises build strength and endurance in your hip. Endurance is the ability to use your muscles for a long time, even after they get tired. Bridge This exercise strengthens the muscles of your hip (hip extensors). 1. Lie on your back on a firm surface with your knees bent and your feet flat on the floor. 2. Tighten your buttocks muscles and lift your bottom off the floor until the trunk of your body and your hips are level with your thighs. ? Do not arch your back. ? You should feel the muscles working in your buttocks and the back of your thighs. If you do not feel these muscles, slide your feet 1-2 inches (2.5-5 cm) farther away from your buttocks. 3. Hold this position for __________ seconds. 4. Slowly lower your hips to the starting position. 5. Let your muscles relax completely between repetitions. Repeat __________ times. Complete this exercise __________ times a day.   Straight leg raises, side-lying This exercise strengthens the muscles that move the hip joint away from the center of the body (hip abductors). 1. Lie on your side with your left / right leg in the top position. Lie so your head, shoulder, hip, and knee line up. You may bend your bottom knee slightly to help you balance. 2. Roll your hips slightly forward, so your hips are stacked directly over each other and your left / right knee is facing forward. 3. Leading with your heel, lift your top leg 4-6 inches (10-15 cm). You should feel the muscles in your top hip lifting. ? Do not let your foot drift forward. ? Do not let your knee roll toward the ceiling. 4. Hold this position for __________ seconds. 5. Slowly return to the starting position. 6. Let your muscles relax completely between repetitions. Repeat __________  times. Complete this exercise __________ times a day.   Straight leg raises, side-lying This exercise strengthens the muscles that move the hip joint toward the center of the body (hip adductors). 1. Lie on your side with your left / right leg in the bottom position. Lie so your head, shoulder, hip, and knee line up. You may place your upper foot in front to help you balance. 2. Roll your hips slightly forward, so your hips are stacked directly over each other and your left / right knee is facing forward. 3. Tense the muscles in your inner thigh and lift your bottom leg 4-6 inches (10-15 cm). 4. Hold this position for __________ seconds. 5. Slowly return to the starting position. 6. Let your muscles relax completely between repetitions. Repeat __________ times. Complete this exercise __________ times a day.   Straight leg raises, supine This exercise strengthens the muscles in the front of your thigh (quadriceps). 1. Lie on your back (supine position) with your left / right leg extended and your  other knee bent. 2. Tense the muscles in the front of your left / right thigh. You should see your kneecap slide up or see increased dimpling just above your knee. 3. Keep these muscles tight as you raise your leg 4-6 inches (10-15 cm) off the floor. Do not let your knee bend. 4. Hold this position for __________ seconds. 5. Keep these muscles tense as you lower your leg. 6. Relax the muscles slowly and completely between repetitions. Repeat __________ times. Complete this exercise __________ times a day.   Hip abductors, standing This exercise strengthens the muscles that move the leg and hip joint away from the center of the body (hip abductors). 1. Tie one end of a rubber exercise band or tubing to a secure surface, such as a chair, table, or pole. 2. Loop the other end of the band or tubing around your left / right ankle. 3. Keeping your ankle with the band or tubing directly opposite the secured  end, step away until there is tension in the tubing or band. Hold on to a chair, table, or pole as needed for balance. 4. Lift your left / right leg out to your side. While you do this: ? Keep your back upright. ? Keep your shoulders over your hips. ? Keep your toes pointing forward. ? Make sure to use your hip muscles to slowly lift your leg. Do not tip your body or forcefully lift your leg. 5. Hold this position for __________ seconds. 6. Slowly return to the starting position. Repeat __________ times. Complete this exercise __________ times a day. Squats This exercise strengthens the muscles in the front of your thigh (quadriceps). 1. Stand in a door frame so your feet and knees are in line with the frame. You may place your hands on the frame for balance. 2. Slowly bend your knees and lower your hips like you are going to sit in a chair. ? Keep your lower legs in a straight-up-and-down position. ? Do not let your hips go lower than your knees. ? Do not bend your knees lower than told by your health care provider. ? If your hip pain increases, do not bend as low. 3. Hold this position for ___________ seconds. 4. Slowly push with your legs to return to standing. Do not use your hands to pull yourself to standing. Repeat __________ times. Complete this exercise __________ times a day. This information is not intended to replace advice given to you by your health care provider. Make sure you discuss any questions you have with your health care provider. Document Revised: 12/18/2018 Document Reviewed: 03/25/2018 Elsevier Patient Education  2021 [employer].

## 2020-11-02 NOTE — Assessment & Plan Note (Addendum)
Improved mood but Worsening headache with cymbalta. Change to lexapro New rx sent

## 2020-11-02 NOTE — Assessment & Plan Note (Signed)
BP not at goal BP Readings from Last 3 Encounters:  11/02/20 140/90  07/31/20 (!) 146/101  07/06/20 124/80   Increase amlodipine to 10mg  at hs

## 2020-11-02 NOTE — Assessment & Plan Note (Addendum)
Unable to sustain an erection x 66months AM erection Denies any substance use Improved with use of viagra 50mg   Check cbc, cmp, tsh, lipid panel, testosterone, prolactin. If normal, refer to urology

## 2020-11-02 NOTE — Progress Notes (Signed)
Subjective:  Patient ID: Jordan [name], male    DOB: 10-Oct-1975  Age: 45 y.o. MRN: [id number]  CC: Follow-up (6 month f/u on HTN and hyperlipidemia./Pt states he is in need of BP medication refill. )  HPI  GAD (generalized anxiety disorder) Improved mood but Worsening headache with cymbalta. Change to lexapro New rx sent  Hypertension BP not at goal BP Readings from Last 3 Encounters:  11/02/20 140/90  07/31/20 (!) 146/101  07/06/20 124/80   Increase amlodipine to 10mg  at hs  Erectile dysfunction Unable to sustain an erection x 109months AM erection Denies any substance use Improved with use of viagra 50mg   Check cbc, cmp, tsh, lipid panel, testosterone, prolactin. If normal, refer to urology  BP Readings from Last 3 Encounters:  11/02/20 140/90  07/31/20 (!) 146/101  07/06/20 124/80   Reviewed past Medical, Social and Family history today.  Outpatient Medications Prior to Visit  Medication Sig Dispense Refill  . albuterol (PROAIR HFA) 108 (90 Base) MCG/ACT inhaler Inhale 1-2 puffs into the lungs every 6 (six) hours as needed for wheezing or shortness of breath. 18 g 1  . azelastine (ASTELIN) 0.1 % nasal spray 2 sprays per nostril 2 times daily as needed for drainage. 30 mL 5  . beclomethasone (QVAR REDIHALER) 80 MCG/ACT inhaler Inhale 2 puffs into the lungs 2 (two) times daily. Rinse mouth out after each use. 21.2 g 3  . Budeson-Glycopyrrol-Formoterol (BREZTRI AEROSPHERE) 160-9-4.8 MCG/ACT AERO Inhale 2 puffs into the lungs in the morning and at bedtime. 10.7 g 5  . Dexchlorpheniramine Maleate (RYCLORA) 2 MG/5ML SOLN Take 5 ml every 4-6 hours as needed 1800 mL 1  . Dexchlorpheniramine Maleate (RYCLORA) 2 MG/5ML SOLN Take 5 mLs by mouth every 4 (four) hours as needed. 280 mL 0  . doxycycline (ADOXA) 100 MG tablet Take 1 tablet (100 mg total) by mouth 2 (two) times daily. 14 tablet 0  . EPINEPHrine 0.3 mg/0.3 mL IJ SOAJ injection INJECT 0.3 MLS INTO MUSCLE 1 each 2  .  EPIPEN 2-PAK 0.3 MG/0.3ML SOAJ injection INJECT 0.3 MLS INTO MUSCLE    . fenofibrate (TRICOR) 145 MG tablet Take 1 tablet (145 mg total) by mouth daily. 90 tablet 1  . ipratropium (ATROVENT) 0.06 % nasal spray USE 2 SPRAYS IN EACH NOSTRIL THREE TIMES DAILY    . ipratropium (ATROVENT) 0.06 % nasal spray Place 2 sprays into both nostrils 3 (three) times daily.    09/30/20 ipratropium-albuterol (DUONEB) 0.5-2.5 (3) MG/3ML SOLN Take 3 mLs by nebulization every 6 (six) hours as needed (wheezing, shortness of breath, chest tightness). 360 mL 0  . Mepolizumab (NUCALA [place]) Inject into the skin.    . montelukast (SINGULAIR) 10 MG tablet TAKE 1 TABLET BY MOUTH EVERYDAY AT BEDTIME 30 tablet 5  . NUCALA 100 MG SOLR Inject into the skin.    . predniSONE (DELTASONE) 10 MG tablet Take 10 mg by mouth daily.    . sildenafil (VIAGRA) 50 MG tablet Take 1 tablet (50 mg total) by mouth as needed for erectile dysfunction. 10 tablet 1  . Spacer/Aero-Hold Chamber Mask MISC 1 each by Other route 3 times/day as needed-between meals & bedtime. 1 each 0  . sucralfate (CARAFATE) 1 g tablet Take 1 tablet (1 g total) by mouth 4 (four) times daily -  with meals and at bedtime. 120 tablet 3  . amLODipine (NORVASC) 5 MG tablet Take 1 tablet (5 mg total) by mouth daily. 90 tablet 3  . amoxicillin-clavulanate (AUGMENTIN)  875-125 MG tablet Take 1 tablet by mouth every 12 (twelve) hours. (Patient not taking: Reported on 11/02/2020) 14 tablet 0  . azithromycin (ZITHROMAX) 250 MG tablet Take 2 pills on day one and one pill on day two, three, four, and five. (Patient not taking: Reported on 11/02/2020) 6 tablet 0  . dextromethorphan-guaiFENesin (MUCINEX DM) 30-600 MG 12hr tablet Take 1 tablet by mouth 2 (two) times daily. (Patient not taking: Reported on 11/02/2020) 20 tablet 0  . DULoxetine (CYMBALTA) 20 MG capsule Take 1 capsule (20 mg total) by mouth daily. 30 capsule 5  . ibuprofen (ADVIL) 800 MG tablet Take 1 tablet (800 mg total) by mouth 3  (three) times daily. 21 tablet 0  . naproxen (NAPROSYN) 500 MG tablet Take 1 tablet (500 mg total) by mouth 2 (two) times daily with a meal. 60 tablet 2  . ondansetron (ZOFRAN) 8 MG tablet TAKE 1 TABLET (8 MG TOTAL) BY MOUTH EVERY 8 (EIGHT) HOURS AS NEEDED FOR NAUSEA OR VOMITING. (Patient not taking: No sig reported) 18 tablet 0  . pantoprazole (PROTONIX) 40 MG tablet Take 1 tablet (40 mg total) by mouth daily. (Patient not taking: No sig reported) 30 tablet 5  . predniSONE (DELTASONE) 50 MG tablet Take 1 tablet (50 mg total) by mouth daily with breakfast. 5 tablet 0   Facility-Administered Medications Prior to Visit  Medication Dose Route Frequency Provider Last Rate Last Admin  . Mepolizumab SOLR 100 mg  100 mg Subcutaneous Q28 days Marcelyn Bruins, MD   100 mg at 10/11/20 1900    ROS See HPI  Objective:  BP 140/90 (BP Location: Left Arm, Patient Position: Sitting, Cuff Size: Large)   Pulse 76   Temp 98.3 F (36.8 C) (Temporal)   Ht 5' 8.75" (1.746 m)   Wt 170 lb 9.6 oz (77.4 kg)   SpO2 98%   BMI 25.38 kg/m   Physical Exam Vitals reviewed.  Cardiovascular:     Rate and Rhythm: Normal rate and regular rhythm.     Pulses: Normal pulses.     Heart sounds: Normal heart sounds.  Pulmonary:     Effort: Pulmonary effort is normal.     Breath sounds: Normal breath sounds.  Abdominal:     General: There is no distension.     Palpations: Abdomen is soft.     Tenderness: There is no abdominal tenderness. There is no right CVA tenderness or left CVA tenderness.  Musculoskeletal:        General: No tenderness or deformity. Normal range of motion.     Right hip: Normal.     Left hip: Normal.     Right upper leg: Normal.     Left upper leg: Normal.     Right knee: Normal.     Left knee: Normal.     Right lower leg: Normal. No edema.     Left lower leg: Normal. No edema.  Neurological:     Mental Status: He is alert and oriented to person, place, and time.     Assessment & Plan:  This visit occurred during the SARS-CoV-2 public health emergency.  Safety protocols were in place, including screening questions prior to the visit, additional usage of staff PPE, and extensive cleaning of exam room while observing appropriate contact time as indicated for disinfecting solutions.   Darly was seen today for follow-up.  Diagnoses and all orders for this visit:  Essential hypertension -     Discontinue: amLODipine (NORVASC) 5 MG  tablet; Take 1 tablet (5 mg total) by mouth daily. -     Comprehensive metabolic panel -     amLODipine (NORVASC) 10 MG tablet; Take 1 tablet (10 mg total) by mouth daily.  Elevated LDL cholesterol level -     Lipid panel  Erectile dysfunction, unspecified erectile dysfunction type -     Urinalysis -     Urine cytology ancillary only([place]) -     Comprehensive metabolic panel -     TSH -     Testosterone -     Prolactin  Acute right hip pain  GAD (generalized anxiety disorder) -     escitalopram (LEXAPRO) 10 MG tablet; Take 1 tablet (10 mg total) by mouth daily.  Primary hypertension   Problem List Items Addressed This Visit      Cardiovascular and Mediastinum   Hypertension    BP not at goal BP Readings from Last 3 Encounters:  11/02/20 140/90  07/31/20 (!) 146/101  07/06/20 124/80   Increase amlodipine to 10mg  at hs      Relevant Medications   amLODipine (NORVASC) 10 MG tablet     Other   Elevated LDL cholesterol level   Relevant Orders   Lipid panel   Erectile dysfunction    Unable to sustain an erection x 42months AM erection Denies any substance use Improved with use of viagra 50mg   Check cbc, cmp, tsh, lipid panel, testosterone, prolactin. If normal, refer to urology      Relevant Orders   Urinalysis (Completed)   Urine cytology ancillary only([place])   Comprehensive metabolic panel   TSH   Testosterone   Prolactin   GAD (generalized anxiety disorder)    Improved  mood but Worsening headache with cymbalta. Change to lexapro New rx sent      Relevant Medications   escitalopram (LEXAPRO) 10 MG tablet    Other Visit Diagnoses    Essential hypertension    -  Primary   Relevant Medications   amLODipine (NORVASC) 10 MG tablet   Other Relevant Orders   Comprehensive metabolic panel   Acute right hip pain          Follow-up: Return in about 3 months (around 02/02/2021) for HTN and anxiety ( ).  04/04/2021, NP

## 2020-11-03 LAB — COMPREHENSIVE METABOLIC PANEL
ALT: 30 U/L (ref 0–53)
AST: 25 U/L (ref 0–37)
Albumin: 4.7 g/dL (ref 3.5–5.2)
Alkaline Phosphatase: 74 U/L (ref 39–117)
BUN: 11 mg/dL (ref 6–23)
CO2: 31 mEq/L (ref 19–32)
Calcium: 9.8 mg/dL (ref 8.4–10.5)
Chloride: 101 mEq/L (ref 96–112)
Creatinine, Ser: 1.01 mg/dL (ref 0.40–1.50)
GFR: 90.4 mL/min (ref >60.00)
Glucose, Bld: 90 mg/dL (ref 70–99)
Potassium: 4.3 mEq/L (ref 3.5–5.1)
Sodium: 141 mEq/L (ref 135–145)
Total Bilirubin: 0.5 mg/dL (ref 0.2–1.2)
Total Protein: 8.1 g/dL (ref 6.0–8.3)

## 2020-11-03 LAB — URINE CYTOLOGY ANCILLARY ONLY
Chlamydia: NEGATIVE
Comment: NEGATIVE
Comment: NEGATIVE
Comment: NORMAL
Neisseria Gonorrhea: NEGATIVE
Trichomonas: NEGATIVE

## 2020-11-03 LAB — LIPID PANEL
Cholesterol: 217 mg/dL — ABNORMAL HIGH (ref 0–200)
HDL: 67.7 mg/dL (ref >39.00)
NonHDL: 149.66
Total CHOL/HDL Ratio: 3
Triglycerides: 270 mg/dL — ABNORMAL HIGH (ref 0.0–149.0)
VLDL: 54 mg/dL — ABNORMAL HIGH (ref 0.0–40.0)

## 2020-11-03 LAB — PROLACTIN: Prolactin: 5.8 ng/mL (ref 2.0–18.0)

## 2020-11-03 LAB — LDL CHOLESTEROL, DIRECT: Direct LDL: 98 mg/dL

## 2020-11-03 LAB — TESTOSTERONE: Testosterone: 260.31 ng/dL — ABNORMAL LOW (ref 300.00–890.00)

## 2020-11-03 LAB — TSH: TSH: 1.13 u[IU]/mL (ref 0.35–4.50)

## 2020-11-04 ENCOUNTER — Encounter: Payer: Self-pay | Admitting: Nurse Practitioner

## 2020-11-04 NOTE — Telephone Encounter (Signed)
Pt called in stating that he saw lab results in mychart and wants to know if something is going to be presribed or what to do. Advised pt that provider has to review and provide recommendations before CMA calls him.

## 2020-11-05 MED ORDER — TESTOSTERONE 40.5 MG/2.5GM (1.62%) TD GEL: 1.0000 | Freq: Every day | TRANSDERMAL | 0 refills | Status: DC

## 2020-11-05 NOTE — Addendum Note (Signed)
Addended by: Alysia Penna L on: 11/05/2020 11:30 AM   Modules accepted: Orders

## 2020-11-07 ENCOUNTER — Telehealth: Payer: Self-pay | Admitting: Nurse Practitioner

## 2020-11-07 DIAGNOSIS — E781 Pure hyperglyceridemia: Secondary | ICD-10-CM

## 2020-11-07 NOTE — Telephone Encounter (Signed)
-----   Message from Anne Ng, NP sent at 11/05/2020 11:30 AM EDT ----- Low testosterone and abnormal lipid panel. Are you taking fenofibrate daily? Sent androgel prescription Other labs are normal F/up in 30months for repeat labs (fasting)

## 2020-11-07 NOTE — Telephone Encounter (Signed)
Patient is calling to speak to the nurse. He states that he has questions about his medications. Please give him a call back at [phone].

## 2020-11-07 NOTE — Telephone Encounter (Signed)
Jordan [name] is out of Fenofibrate. Please send Rx to pharmacy.   He is complains of reflux when taking Escitalopram & Carafate together. Patient stated he usually eats late in afternoon or evenings. And he is taking all his medications with his evening or afternoon meal.   Patient advised to spread his medication out if possible. And eat smaller meals throughout the day. However [name] NP will be informed.   Please advise.  Thank you

## 2020-11-08 ENCOUNTER — Ambulatory Visit: Payer: Self-pay

## 2020-11-08 MED ORDER — FENOFIBRATE 145 MG PO TABS: 145.0000 mg | ORAL_TABLET | Freq: Every day | ORAL | 1 refills | Status: DC

## 2020-11-08 NOTE — Telephone Encounter (Signed)
Patient notified VIA phone of recommendations and that rx was sent to the pharmacy. No other questions.  Dm/cma

## 2020-11-08 NOTE — Telephone Encounter (Signed)
Patient notified VIA phone of recommendations and that rx was sent to the pharmacy. No other questions.  Dm/cma  

## 2020-11-08 NOTE — Telephone Encounter (Signed)
Carafate should be taken 1hr before meals, therefore lexapro should be taken 2hrs apart from carafate dose or after food intake. Fenofibrate refills ent

## 2020-11-10 ENCOUNTER — Telehealth: Payer: Self-pay

## 2020-11-10 DIAGNOSIS — E291 Testicular hypofunction: Secondary | ICD-10-CM

## 2020-11-10 DIAGNOSIS — N529 Male erectile dysfunction, unspecified: Secondary | ICD-10-CM

## 2020-11-10 NOTE — Telephone Encounter (Signed)
Submitted a PA for Testosterone Gel, received additional information request. They are requiring 2 early morning blood tests for testosterone and he has only had one.   Please review and advise.  Thanks.  Dm/cma

## 2020-11-10 NOTE — Telephone Encounter (Signed)
PA for Testosterone 1.62 gel submitted through cover my meds to [facility].  Awaiting response. Dm/cma   (Key: [id number]

## 2020-11-11 ENCOUNTER — Telehealth: Payer: Self-pay

## 2020-11-11 NOTE — Telephone Encounter (Signed)
Spoke to patient andhe will call back to schedule an early morning appt to when he can arrange things.  Dm/cma

## 2020-11-11 NOTE — Addendum Note (Signed)
Addended by: Waymond Cera on: 11/11/2020 08:16 AM   Modules accepted: Orders

## 2020-11-11 NOTE — Telephone Encounter (Signed)
Called patient back and he states he will have to call back to schedule alab appt when he can arrange things.  Dm/cma

## 2020-11-11 NOTE — Telephone Encounter (Signed)
Lft VM to rtn call to come in for an early AM testosterone level for prior auth requirements. Dm/cma

## 2020-11-11 NOTE — Telephone Encounter (Signed)
I read your message to the pt and I tried to schedule him an appointment. He declined and would like a cb to talk with you about his PA. [phone]

## 2020-11-11 NOTE — Telephone Encounter (Signed)
Added lab order and lft VM for patient to call the office. Dm/cma

## 2020-11-15 ENCOUNTER — Ambulatory Visit: Payer: Self-pay

## 2020-11-16 ENCOUNTER — Telehealth: Payer: Self-pay | Admitting: Nurse Practitioner

## 2020-11-16 DIAGNOSIS — N529 Male erectile dysfunction, unspecified: Secondary | ICD-10-CM

## 2020-11-16 NOTE — Telephone Encounter (Signed)
Mr. Schank called:    Will you please send in the Sildenafil 20 MG to [facility]? Until he can get back in for labs.   Per patient its hard for him to come to [facility],  for appointments. He has kids and lives near [place].   -Call back phone [phone].

## 2020-11-16 NOTE — Telephone Encounter (Signed)
Pt is wanting a cb concerning a prescription. I tried to get more information, but he would not give me anymore. Please advise pt at [phone].

## 2020-11-17 MED ORDER — SILDENAFIL CITRATE 25 MG PO TABS: 25.0000 mg | ORAL_TABLET | ORAL | 0 refills | Status: DC | PRN

## 2020-11-17 NOTE — Telephone Encounter (Signed)
Patient states that the wrong "mg" of Sildenafil was sent to the pharmacy. He said that it's supposed to be 20mg , but 25mg  was sent in. Please update and resend.

## 2020-11-17 NOTE — Telephone Encounter (Signed)
No voice ID. A generic message was left for call back to the office.

## 2020-11-17 NOTE — Telephone Encounter (Signed)
Rx sent 

## 2020-11-21 ENCOUNTER — Ambulatory Visit: Payer: Self-pay

## 2020-11-22 ENCOUNTER — Ambulatory Visit (INDEPENDENT_AMBULATORY_CARE_PROVIDER_SITE_OTHER): Payer: Self-pay | Admitting: *Deleted

## 2020-11-22 ENCOUNTER — Ambulatory Visit: Payer: Self-pay

## 2020-11-22 ENCOUNTER — Other Ambulatory Visit: Payer: Self-pay

## 2020-11-22 DIAGNOSIS — J4540 Moderate persistent asthma, uncomplicated: Secondary | ICD-10-CM

## 2020-11-22 DIAGNOSIS — J4550 Severe persistent asthma, uncomplicated: Secondary | ICD-10-CM

## 2020-11-29 ENCOUNTER — Telehealth: Payer: Self-pay

## 2020-11-29 NOTE — Telephone Encounter (Signed)
Pt is waning a cb concerning this prescription. Please advse

## 2020-11-29 NOTE — Telephone Encounter (Signed)
Spoke with patient earlier and informed him that the request for his sildenafil would be sent to another provider but in order for him to have a increase in his Lexapro he would have to have a f/u appointment. This was explained to the patient in detail. Closing note.

## 2020-11-29 NOTE — Telephone Encounter (Signed)
Pt states he wants medication lowered to 20 mg because the lower dose makes it affordable with his insurance ( $7 instead of $300) please advise.

## 2020-11-29 NOTE — Telephone Encounter (Signed)
Pt would like a call back. He is wanting the strength of his meds changed and cannot wait until [name] is back. Asking for a nurse to return his call at [phone]. He wants the Sildenafil lowered Lexapro increased. Please advise

## 2020-11-30 NOTE — Telephone Encounter (Signed)
Sildenafil (viagra) comes in 25, 50, 100mg .

## 2020-12-03 IMAGING — US US ABDOMEN COMPLETE
1 series · 14 of 25 positions shown · non-contrast
Comparison: CT scan of the abdomen dated 04/21/2017

CLINICAL DATA: Elevated liver function tests.

EXAM:
ABDOMEN ULTRASOUND COMPLETE

[Series 1: us abdomen complete · 0.19mm/px · 14 of 77 slices shown]
[im 1/77]
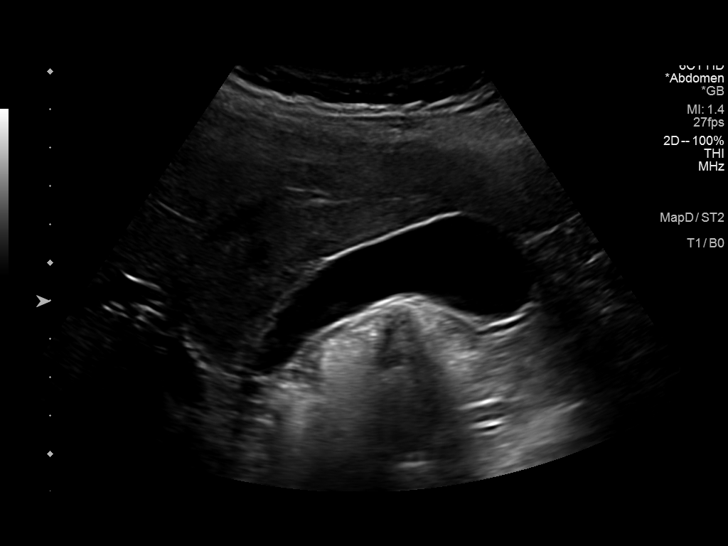
[im 7/77]
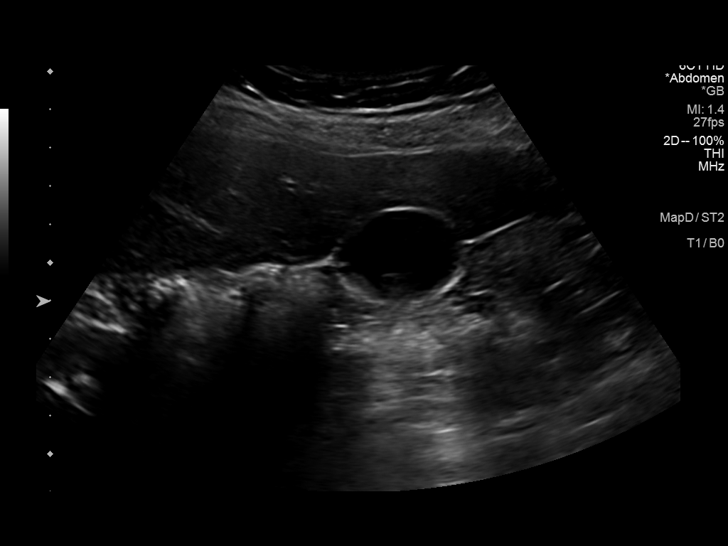
[im 13/77]
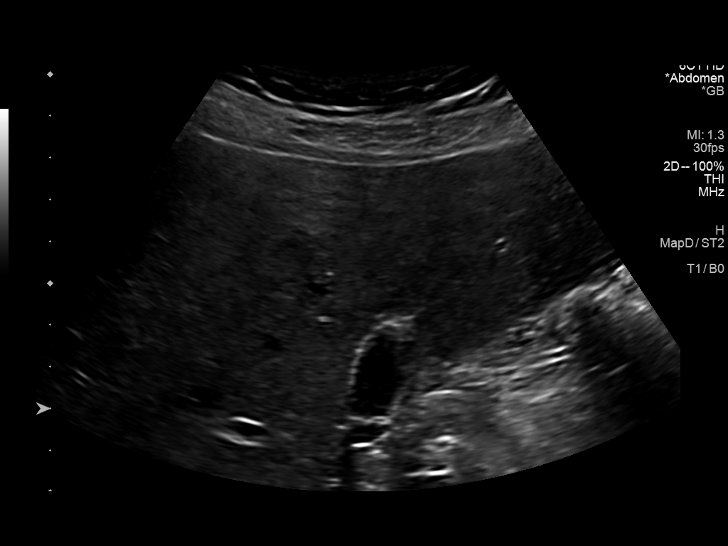
[im 20/77]
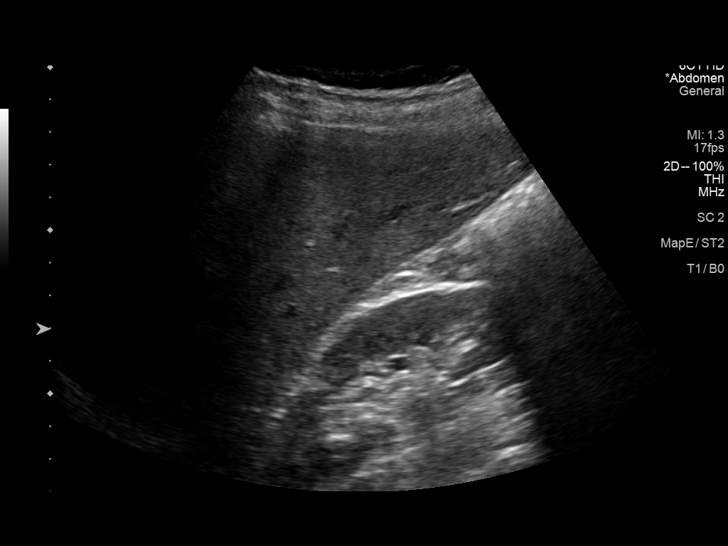
[im 26/77]
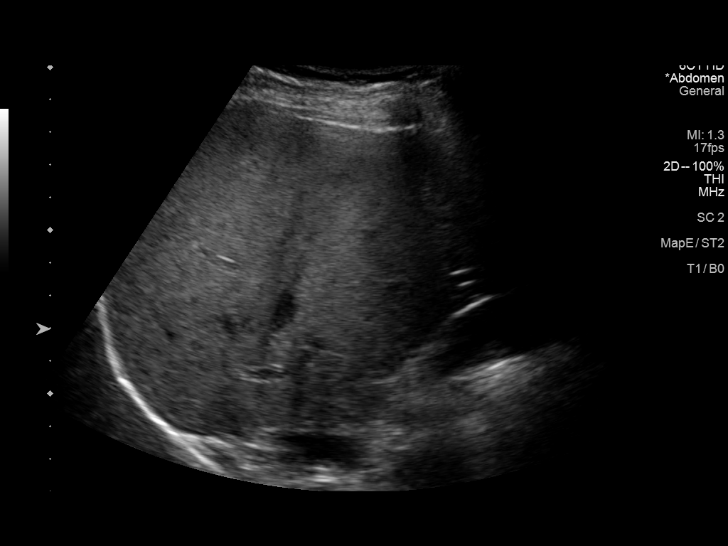
[im 29/77]
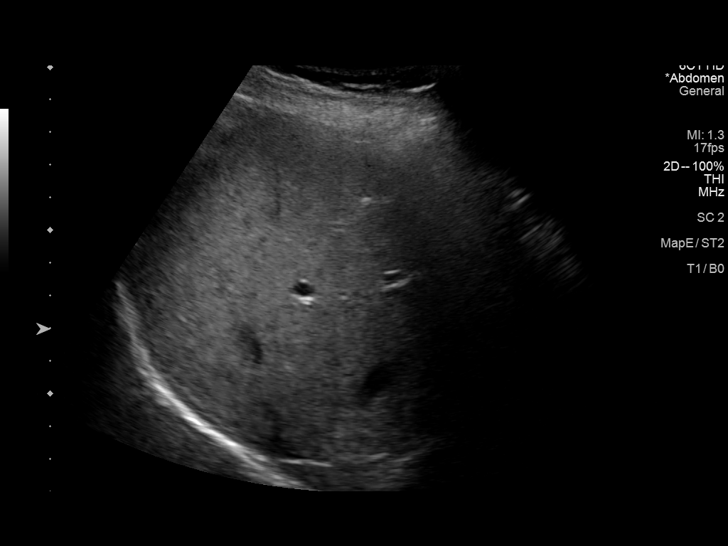
[im 35/77]
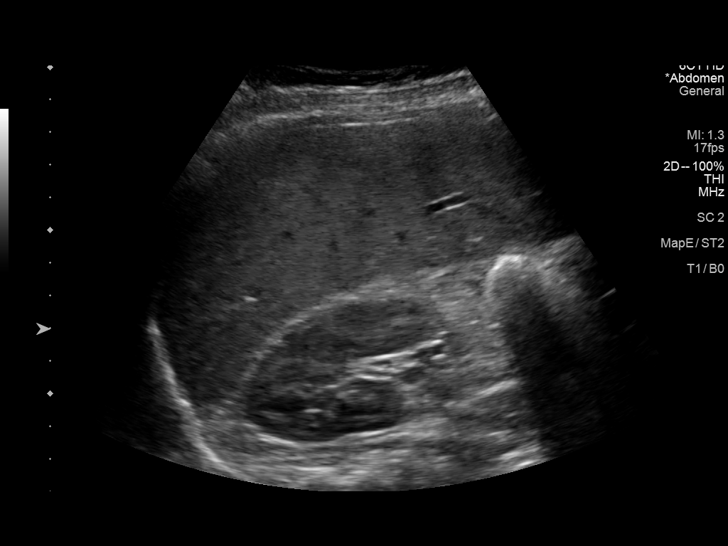
[im 42/77]
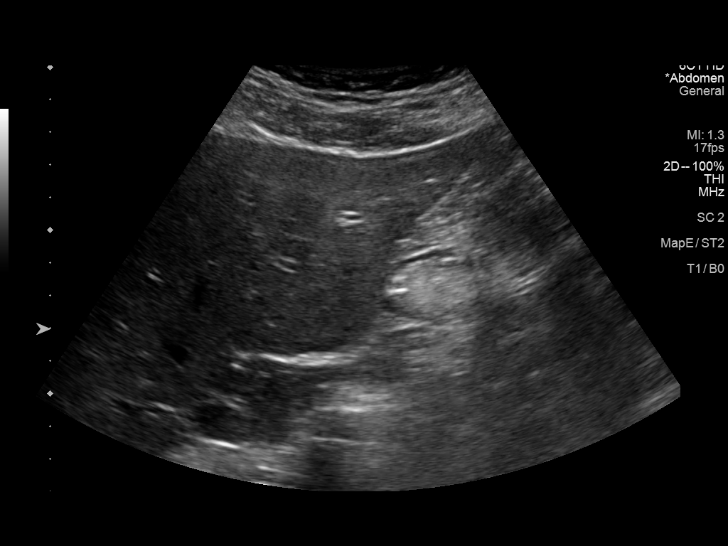
[im 48/77]
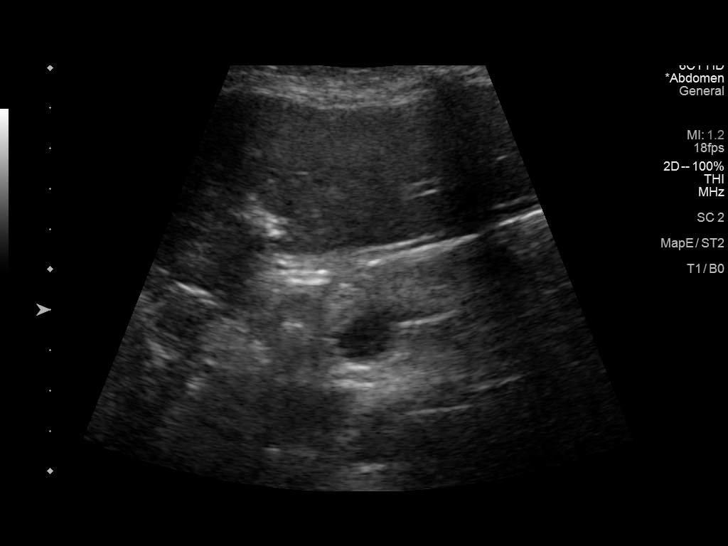
[im 51/77]
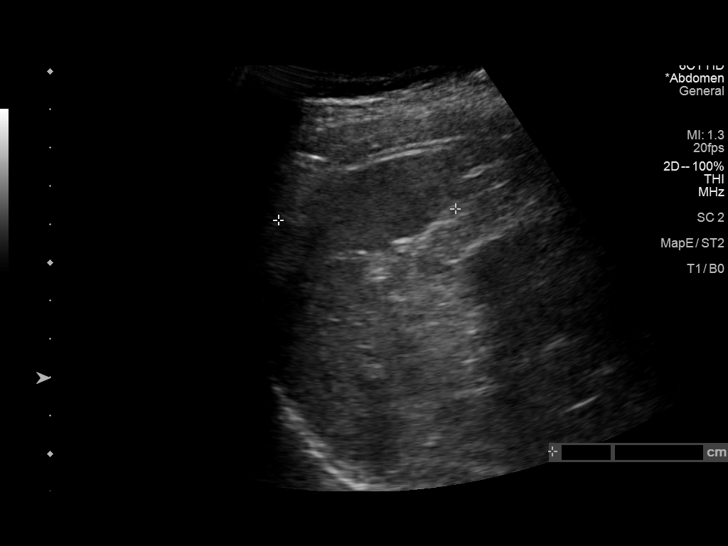
[im 58/77]
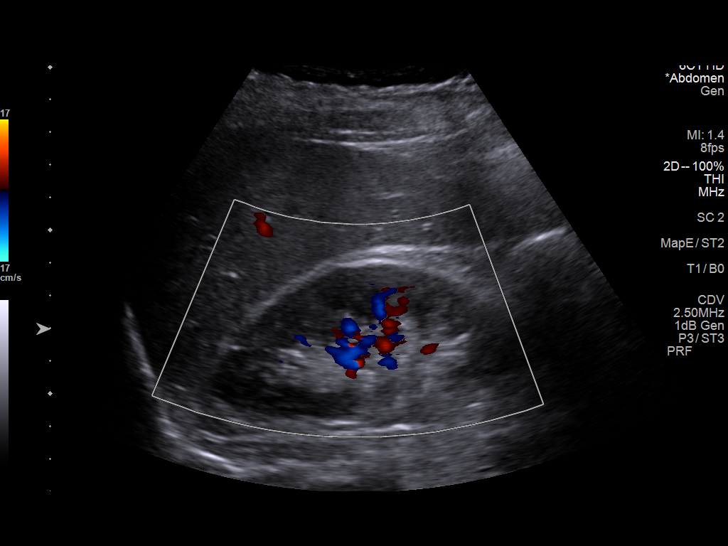
[im 64/77]
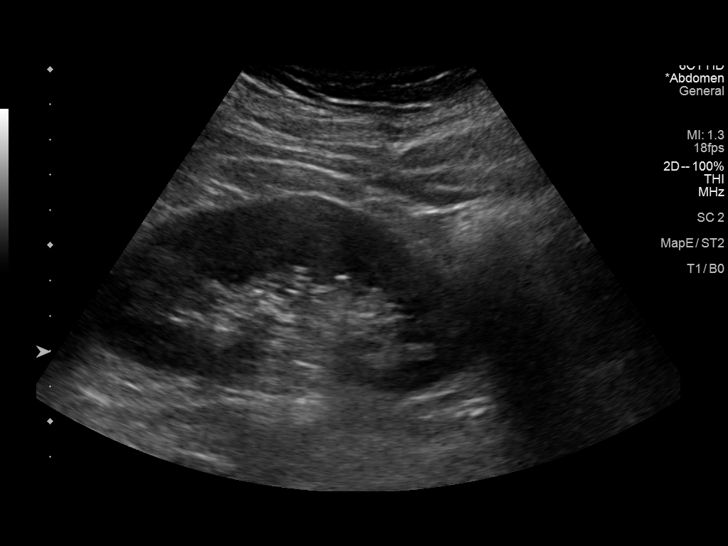
[im 70/77]
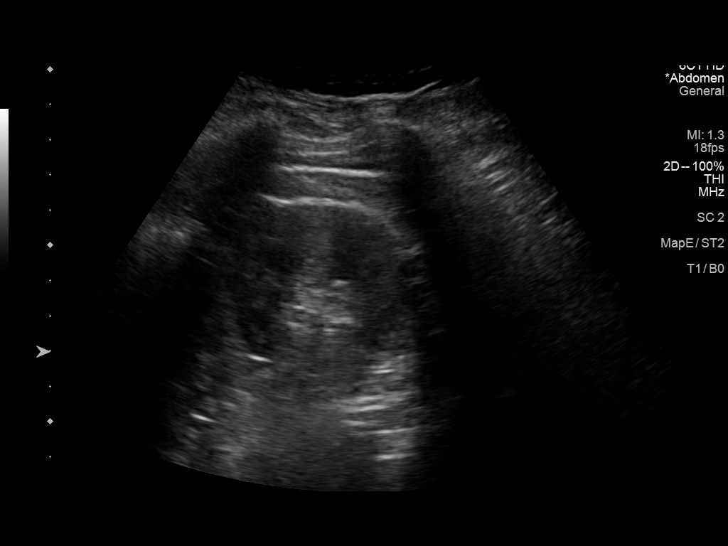
[im 77/77]
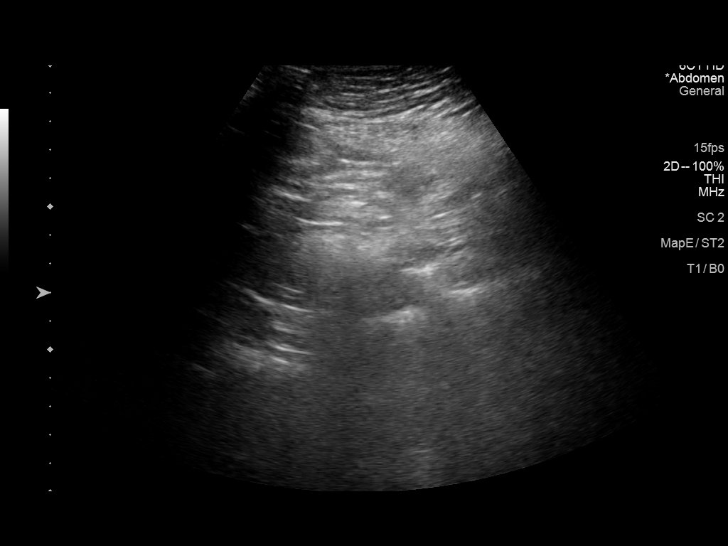

[14 of 25 positions shown; findings below may reference images not displayed]

FINDINGS: Gallbladder: No gallstones or wall thickening visualized. No
sonographic Murphy sign noted by sonographer.

Common bile duct: Diameter: 2.3 mm

Liver: No focal lesion identified. Within normal limits in
parenchymal echogenicity. Portal vein is patent on color Doppler
imaging with normal direction of blood flow towards the liver.

IVC: No abnormality visualized.

Pancreas: Visualized portion unremarkable.

Spleen: Size and appearance within normal limits.

Right Kidney: Length: 10.0 cm. Echogenicity within normal limits. No
mass or hydronephrosis visualized.

Left Kidney: Length: 10.7 cm. Echogenicity within normal limits. No
mass or hydronephrosis visualized.

Abdominal aorta: No aneurysm visualized.

Other findings: None.
IMPRESSION: Normal exam.

## 2020-12-19 ENCOUNTER — Telehealth: Payer: Self-pay

## 2020-12-19 NOTE — Telephone Encounter (Signed)
LVM informing patient to call our office in regards to lab work needed for medication approval.

## 2020-12-20 ENCOUNTER — Ambulatory Visit: Payer: Self-pay

## 2020-12-22 NOTE — Telephone Encounter (Signed)
Received an approval for the Testosterone through cover my meds from [employer] from 12/20/20 - 12/19/21.  Pharmacy notified VIA phone. Dm/cma

## 2020-12-26 ENCOUNTER — Other Ambulatory Visit: Payer: Self-pay

## 2020-12-26 ENCOUNTER — Ambulatory Visit (INDEPENDENT_AMBULATORY_CARE_PROVIDER_SITE_OTHER): Payer: 59

## 2020-12-26 DIAGNOSIS — J4550 Severe persistent asthma, uncomplicated: Secondary | ICD-10-CM

## 2020-12-26 DIAGNOSIS — J4540 Moderate persistent asthma, uncomplicated: Secondary | ICD-10-CM

## 2021-01-19 ENCOUNTER — Telehealth: Payer: Self-pay | Admitting: Nurse Practitioner

## 2021-01-19 NOTE — Telephone Encounter (Signed)
Pt called and asked to speak with [place]

## 2021-01-20 NOTE — Telephone Encounter (Signed)
LVM for patient to return call. 

## 2021-01-23 ENCOUNTER — Ambulatory Visit: Payer: Self-pay

## 2021-01-24 NOTE — Telephone Encounter (Signed)
Pt called back, asked to speak with nurse about his anxiety medicine "it's not working".

## 2021-01-25 ENCOUNTER — Ambulatory Visit: Payer: Self-pay

## 2021-01-25 NOTE — Telephone Encounter (Signed)
Spoke to patient, he would like to know if he can make his appointment on 02/02/21 @ 1:30 pm from an office visit to a VV visit due to he is unable to leave work and get her but will be able to take a short break to make a VV appt work.  Please review and advise.   Thanks. Dm/cma

## 2021-01-26 NOTE — Telephone Encounter (Signed)
Lft VM to rtn call. Dm/cma  

## 2021-01-26 NOTE — Telephone Encounter (Signed)
Noted. Dm/cma  

## 2021-01-26 NOTE — Telephone Encounter (Signed)
Spoke to patient and he has a BP cuff that can do both BP & pulse.   Can you please and thank you change his appt to a VV? Thanks. Dm/cma

## 2021-01-27 ENCOUNTER — Telehealth: Payer: Self-pay | Admitting: Allergy & Immunology

## 2021-01-27 ENCOUNTER — Telehealth: Payer: Self-pay

## 2021-01-27 NOTE — Telephone Encounter (Signed)
Patient is having an asthma flare. Please call him back

## 2021-01-27 NOTE — Telephone Encounter (Signed)
He stated that he did not feel well and decided to cancel his appointment. I asked him if he planned to reschedule and he was not concerned about it.

## 2021-01-27 NOTE — Telephone Encounter (Signed)
Patient called stating he is having issues with his asthma. He cancelled is injection for 01/25/21 and has Not been seen since February of 2022. I informed him that we could not send in any more courtesy refills without him being seen in he can cancelled every visit we made and states he has not seen a pulmonologist but has an appointment in October. He did not agree with me and refused to make an appointment. He wants to talk to [facility] about his symptoms and medications. Which I did let him know we would be able to do the same type of evaluation but more in detail at an office visit.

## 2021-02-02 ENCOUNTER — Telehealth (INDEPENDENT_AMBULATORY_CARE_PROVIDER_SITE_OTHER): Payer: 59 | Admitting: Nurse Practitioner

## 2021-02-02 ENCOUNTER — Encounter: Payer: Self-pay | Admitting: Nurse Practitioner

## 2021-02-02 ENCOUNTER — Telehealth: Payer: 59 | Admitting: Nurse Practitioner

## 2021-02-02 VITALS — BP 137/96 | HR 91 | Ht 69.0 in | Wt 179.0 lb

## 2021-02-02 DIAGNOSIS — I10 Essential (primary) hypertension: Secondary | ICD-10-CM

## 2021-02-02 DIAGNOSIS — F411 Generalized anxiety disorder: Secondary | ICD-10-CM

## 2021-02-02 MED ORDER — ESCITALOPRAM OXALATE 20 MG PO TABS
20.0000 mg | ORAL_TABLET | Freq: Every day | ORAL | 3 refills | Status: DC
Start: 2021-02-02 — End: 2021-11-23

## 2021-02-02 MED ORDER — LOSARTAN POTASSIUM 25 MG PO TABS: 25.0000 mg | ORAL_TABLET | Freq: Every day | ORAL | 5 refills | Status: DC

## 2021-02-02 NOTE — Assessment & Plan Note (Signed)
Increase anxiety, irritability and insomnia with current dose of lexapro. States medication initially helped but effects stopped in last 70months. Denies any SI or hallucination.  Increase lexapro to 20mg  F/up in 42month

## 2021-02-02 NOTE — Progress Notes (Signed)
Virtual Visit via Video Note  I connected withNAME@ on 02/02/21 at  1:30 PM EDT by a video enabled telemedicine application and verified that I am speaking with the correct person using two identifiers.  Location: Patient:Home Provider: Office Participants: patient and provider  I discussed the limitations of evaluation and management by telemedicine and the availability of in person appointments. I also discussed with the patient that there may be a patient responsible charge related to this service. The patient expressed understanding and agreed to proceed.  MG:[id number] and HTN f/up  History of Present Illness: GAD (generalized anxiety disorder) Increase anxiety, irritability and insomnia with current dose of lexapro. States medication initially helped but effects stopped in last 80months. Denies any SI or hallucination.  Increase lexapro to 20mg  F/up in 46month  Hypertension BP not at goal, reports headache in AM No dizziness or LE edema or palpitations or CP. Reports he is complaint with amlodipine and DASH diet. Home BP reading 140s/100s BP Readings from Last 3 Encounters:  02/02/21 (!) 137/96  11/02/20 140/90  07/31/20 (!) 146/101   Maintain amlodipine dose Add losartan 25mg  in PM F/up in 67month  Observations/Objective: Physical Exam Cardiovascular:     Rate and Rhythm: Normal rate.  Pulmonary:     Effort: Pulmonary effort is normal.  Neurological:     Mental Status: He is alert and oriented to person, place, and time.    Assessment and Plan: [place] was seen today for follow-up.  Diagnoses and all orders for this visit:  Primary hypertension -     losartan (COZAAR) 25 MG tablet; Take 1 tablet (25 mg total) by mouth daily.  GAD (generalized anxiety disorder) -     escitalopram (LEXAPRO) 20 MG tablet; Take 1 tablet (20 mg total) by mouth daily.  Follow Up Instructions: See above   I discussed the assessment and treatment plan with the patient. The patient  was provided an opportunity to ask questions and all were answered. The patient agreed with the plan and demonstrated an understanding of the instructions.   The patient was advised to call back or seek an in-person evaluation if the symptoms worsen or if the condition fails to improve as anticipated.  2month, NP

## 2021-02-02 NOTE — Assessment & Plan Note (Signed)
BP not at goal, reports headache in AM No dizziness or LE edema or palpitations or CP. Reports he is complaint with amlodipine and DASH diet. Home BP reading 140s/100s BP Readings from Last 3 Encounters:  02/02/21 (!) 137/96  11/02/20 140/90  07/31/20 (!) 146/101   Maintain amlodipine dose Add losartan 25mg  in PM F/up in 55month

## 2021-02-15 ENCOUNTER — Ambulatory Visit: Payer: 59 | Admitting: Internal Medicine

## 2021-02-15 ENCOUNTER — Ambulatory Visit (INDEPENDENT_AMBULATORY_CARE_PROVIDER_SITE_OTHER): Payer: 59

## 2021-02-15 DIAGNOSIS — J4550 Severe persistent asthma, uncomplicated: Secondary | ICD-10-CM

## 2021-02-22 ENCOUNTER — Other Ambulatory Visit: Payer: Self-pay | Admitting: Allergy

## 2021-02-23 ENCOUNTER — Encounter: Payer: Self-pay | Admitting: Family Medicine

## 2021-02-23 ENCOUNTER — Other Ambulatory Visit: Payer: Self-pay | Admitting: Allergy & Immunology

## 2021-02-23 ENCOUNTER — Telehealth (INDEPENDENT_AMBULATORY_CARE_PROVIDER_SITE_OTHER): Payer: 59 | Admitting: Family Medicine

## 2021-02-23 VITALS — Wt 182.0 lb

## 2021-02-23 DIAGNOSIS — R519 Headache, unspecified: Secondary | ICD-10-CM

## 2021-02-23 DIAGNOSIS — R0981 Nasal congestion: Secondary | ICD-10-CM | POA: Diagnosis not present

## 2021-02-23 MED ORDER — AMOXICILLIN-POT CLAVULANATE 875-125 MG PO TABS: 1.0000 | ORAL_TABLET | Freq: Two times a day (BID) | ORAL | 0 refills | Status: DC

## 2021-02-23 NOTE — Patient Instructions (Signed)
-  I sent the medication(s) we discussed to your pharmacy: Meds ordered this encounter  Medications   amoxicillin-clavulanate (AUGMENTIN) 875-125 MG tablet    Sig: Take 1 tablet by mouth 2 (two) times daily.    Dispense:  20 tablet    Refill:  0     I hope you are feeling better soon!  Seek in person care promptly if your symptoms worsen, new concerns arise or you are not improving with treatment over the next 1-2 days.  It was nice to meet you today. I help [facility] out with telemedicine visits on Tuesdays and Thursdays and am available for visits on those days. If you have any concerns or questions following this visit please schedule a follow up visit with your Primary Care doctor or seek care at a local urgent care clinic to avoid delays in care.

## 2021-02-23 NOTE — Progress Notes (Signed)
Virtual Visit via Video Note  I connected with Jordan [name]  on 02/23/21 at  3:00 PM EDT by a video enabled telemedicine application and verified that I am speaking with the correct person using two identifiers.  Location patient: home, [place] Location provider:work or home office Persons participating in the virtual visit: patient, provider  I discussed the limitations of evaluation and management by telemedicine and the availability of in person appointments. The patient expressed understanding and agreed to proceed.   HPI:  Acute telemedicine visit for sinus discomfort: -Onset: has chronic bad sinus iss -Symptoms include: worse issues the last 5-6 days and R maxillary discomfort, worse at night -Denies:fevers, malaise, CP, SOB -Has tried:ibuprofen -Pertinent past medical history: see below -Pertinent medication allergies: No Known Allergies   ROS: See pertinent positives and negatives per HPI.  Past Medical History:  Diagnosis Date   Asthma    Elevated LFTs 07/29/2019   Reflux esophagitis     Past Surgical History:  Procedure Laterality Date   NO PAST SURGERIES       Current Outpatient Medications:    albuterol (PROAIR HFA) 108 (90 Base) MCG/ACT inhaler, Inhale 1-2 puffs into the lungs every 6 (six) hours as needed for wheezing or shortness of breath., Disp: 18 g, Rfl: 1   amLODipine (NORVASC) 10 MG tablet, Take 1 tablet (10 mg total) by mouth daily., Disp: 90 tablet, Rfl: 3   amoxicillin-clavulanate (AUGMENTIN) 875-125 MG tablet, Take 1 tablet by mouth 2 (two) times daily., Disp: 20 tablet, Rfl: 0   azelastine (ASTELIN) 0.1 % nasal spray, 2 sprays per nostril 2 times daily as needed for drainage., Disp: 30 mL, Rfl: 5   beclomethasone (QVAR REDIHALER) 80 MCG/ACT inhaler, Inhale 2 puffs into the lungs 2 (two) times daily. Rinse mouth out after each use., Disp: 21.2 g, Rfl: 3   EPINEPHrine 0.3 mg/0.3 mL IJ SOAJ injection, INJECT 0.3 MLS INTO MUSCLE, Disp: 1 each, Rfl: 2    escitalopram (LEXAPRO) 20 MG tablet, Take 1 tablet (20 mg total) by mouth daily., Disp: 90 tablet, Rfl: 3   fenofibrate (TRICOR) 145 MG tablet, Take 1 tablet (145 mg total) by mouth daily., Disp: 90 tablet, Rfl: 1   ipratropium (ATROVENT) 0.06 % nasal spray, Place 2 sprays into both nostrils 3 (three) times daily., Disp: , Rfl:    losartan (COZAAR) 25 MG tablet, Take 1 tablet (25 mg total) by mouth daily., Disp: 30 tablet, Rfl: 5   montelukast (SINGULAIR) 10 MG tablet, TAKE 1 TABLET BY MOUTH EVERYDAY AT BEDTIME, Disp: 30 tablet, Rfl: 5   NUCALA 100 MG SOLR, Inject into the skin., Disp: , Rfl:    sucralfate (CARAFATE) 1 g tablet, Take 1 tablet (1 g total) by mouth 4 (four) times daily -  with meals and at bedtime., Disp: 120 tablet, Rfl: 3  Current Facility-Administered Medications:    Mepolizumab SOLR 100 mg, 100 mg, Subcutaneous, Q28 days, Marcelyn Bruins, MD, 100 mg at 02/15/21 1559  EXAM:  VITALS per patient if applicable:  GENERAL: alert, oriented, appears well and in no acute distress  HEENT: atraumatic, conjunttiva clear, no obvious abnormalities on inspection of external nose and ears  NECK: normal movements of the head and neck  LUNGS: on inspection no signs of respiratory distress, breathing rate appears normal, no obvious gross SOB, gasping or wheezing  CV: no obvious cyanosis  MS: moves all visible extremities without noticeable abnormality  PSYCH/NEURO: pleasant and cooperative, no obvious depression or anxiety, speech and thought processing  grossly intact  ASSESSMENT AND PLAN:  Discussed the following assessment and plan:  Facial discomfort  Sinus congestion  -we discussed possible serious and likely etiologies, options for evaluation and workup, limitations of telemedicine visit vs in person visit, treatment, treatment risks and precautions. Pt is agreeable to treatment via telemedicine at this moment. Query sinusitis, dental issue vs other. He wants to  try an empiric abx for possible Sinusitis and agrees to seek inperson care if worsening, new symptoms arise, or if is not improving with treatment over the next 24-48 hours. Discussed options for inperson care if PCP office not available. Did let this patient know that I only do telemedicine on Tuesdays and Thursdays for [place]. Advised to schedule follow up visit with PCP or [facility] if any further questions or concerns to avoid delays in care.   I discussed the assessment and treatment plan with the patient. The patient was provided an opportunity to ask questions and all were answered. The patient agreed with the plan and demonstrated an understanding of the instructions.     Terressa Koyanagi, DO

## 2021-03-08 ENCOUNTER — Ambulatory Visit: Payer: 59 | Admitting: Internal Medicine

## 2021-03-10 ENCOUNTER — Ambulatory Visit: Payer: 59 | Admitting: Nurse Practitioner

## 2021-03-13 ENCOUNTER — Ambulatory Visit: Payer: 59 | Admitting: Allergy & Immunology

## 2021-03-15 ENCOUNTER — Ambulatory Visit: Payer: 59

## 2021-03-22 ENCOUNTER — Ambulatory Visit: Payer: 59

## 2021-03-27 ENCOUNTER — Ambulatory Visit (INDEPENDENT_AMBULATORY_CARE_PROVIDER_SITE_OTHER): Payer: 59

## 2021-03-27 ENCOUNTER — Other Ambulatory Visit: Payer: Self-pay

## 2021-03-27 DIAGNOSIS — J4550 Severe persistent asthma, uncomplicated: Secondary | ICD-10-CM

## 2021-04-02 ENCOUNTER — Other Ambulatory Visit: Payer: Self-pay | Admitting: Allergy

## 2021-04-04 ENCOUNTER — Telehealth: Payer: Self-pay | Admitting: Nurse Practitioner

## 2021-04-04 ENCOUNTER — Telehealth: Payer: Self-pay

## 2021-04-04 NOTE — Telephone Encounter (Signed)
Pt requesting call back today, pt has medication questions. Call back is [phone]

## 2021-04-05 ENCOUNTER — Ambulatory Visit: Payer: 59 | Admitting: Internal Medicine

## 2021-04-14 NOTE — Telephone Encounter (Signed)
LVM for patient to return call. 

## 2021-04-14 NOTE — Telephone Encounter (Signed)
Duplicate, documentation on other encounter.

## 2021-04-18 ENCOUNTER — Telehealth: Payer: Self-pay | Admitting: Allergy

## 2021-04-18 NOTE — Telephone Encounter (Signed)
Patient called stating that the inside of his nose is sore and feels like someone is squeezing it. It's making him slightly lightheaded.  No nosebleeds today but had one yesterday and the day before.  Using Astelin and ipratropium nasal spray.   Advised patient to stop using the above nasal sprays - concerning for irritation of the nasal mucosa.  Use saline gel or Vaseline ointment for a few days to let it heal.  If no improvement, go to PCP or make follow up with our office to look into his nares.

## 2021-04-23 ENCOUNTER — Telehealth: Payer: 59 | Admitting: Emergency Medicine

## 2021-04-23 DIAGNOSIS — K05219 Aggressive periodontitis, localized, unspecified severity: Secondary | ICD-10-CM

## 2021-04-23 DIAGNOSIS — R220 Localized swelling, mass and lump, head: Secondary | ICD-10-CM

## 2021-04-23 MED ORDER — PREDNISONE 20 MG PO TABS: 20.0000 mg | ORAL_TABLET | Freq: Two times a day (BID) | ORAL | 0 refills | Status: AC

## 2021-04-23 MED ORDER — AMOXICILLIN-POT CLAVULANATE 875-125 MG PO TABS: 1.0000 | ORAL_TABLET | Freq: Two times a day (BID) | ORAL | 0 refills | Status: AC

## 2021-04-23 NOTE — Patient Instructions (Signed)
Jordan [name], thank you for joining Rennis Harding, PA-C for today's virtual visit.  While this provider is not your primary care provider (PCP), if your PCP is located in our provider database this encounter information will be shared with them immediately following your visit.  Consent: (Patient) Jordan [name] provided verbal consent for this virtual visit at the beginning of the encounter.  Current Medications:  Current Outpatient Medications:    amoxicillin-clavulanate (AUGMENTIN) 875-125 MG tablet, Take 1 tablet by mouth every 12 (twelve) hours for 10 days., Disp: 20 tablet, Rfl: 0   predniSONE (DELTASONE) 20 MG tablet, Take 1 tablet (20 mg total) by mouth 2 (two) times daily with a meal for 5 days., Disp: 10 tablet, Rfl: 0   albuterol (PROAIR HFA) 108 (90 Base) MCG/ACT inhaler, Inhale 1-2 puffs into the lungs every 6 (six) hours as needed for wheezing or shortness of breath., Disp: 18 g, Rfl: 1   amLODipine (NORVASC) 10 MG tablet, Take 1 tablet (10 mg total) by mouth daily., Disp: 90 tablet, Rfl: 3   azelastine (ASTELIN) 0.1 % nasal spray, 2 sprays per nostril 2 times daily as needed for drainage., Disp: 30 mL, Rfl: 5   beclomethasone (QVAR REDIHALER) 80 MCG/ACT inhaler, Inhale 2 puffs into the lungs 2 (two) times daily. Rinse mouth out after each use., Disp: 21.2 g, Rfl: 3   EPINEPHrine 0.3 mg/0.3 mL IJ SOAJ injection, INJECT 0.3 MLS INTO MUSCLE, Disp: 1 each, Rfl: 2   escitalopram (LEXAPRO) 20 MG tablet, Take 1 tablet (20 mg total) by mouth daily., Disp: 90 tablet, Rfl: 3   fenofibrate (TRICOR) 145 MG tablet, Take 1 tablet (145 mg total) by mouth daily., Disp: 90 tablet, Rfl: 1   ipratropium (ATROVENT) 0.06 % nasal spray, Place 2 sprays into both nostrils 3 (three) times daily., Disp: , Rfl:    losartan (COZAAR) 25 MG tablet, Take 1 tablet (25 mg total) by mouth daily., Disp: 30 tablet, Rfl: 5   montelukast (SINGULAIR) 10 MG tablet, TAKE 1 TABLET BY MOUTH EVERYDAY AT BEDTIME, Disp: 30  tablet, Rfl: 5   NUCALA 100 MG SOLR, Inject into the skin., Disp: , Rfl:    sucralfate (CARAFATE) 1 g tablet, Take 1 tablet (1 g total) by mouth 4 (four) times daily -  with meals and at bedtime., Disp: 120 tablet, Rfl: 3  Current Facility-Administered Medications:    Mepolizumab SOLR 100 mg, 100 mg, Subcutaneous, Q28 days, Marcelyn Bruins, MD, 100 mg at 03/27/21 1131   Medications ordered in this encounter:  Meds ordered this encounter  Medications   amoxicillin-clavulanate (AUGMENTIN) 875-125 MG tablet    Sig: Take 1 tablet by mouth every 12 (twelve) hours for 10 days.    Dispense:  20 tablet    Refill:  0    Order Specific Question:   Supervising Provider    Answer:   Hyacinth Meeker, BRIAN [[id number]]   predniSONE (DELTASONE) 20 MG tablet    Sig: Take 1 tablet (20 mg total) by mouth 2 (two) times daily with a meal for 5 days.    Dispense:  10 tablet    Refill:  0    Order Specific Question:   Supervising Provider    Answer:   Hyacinth Meeker, BRIAN [[id number]]     *If you need refills on other medications prior to your next appointment, please contact your pharmacy*  Follow-Up: Call back or seek an in-person evaluation if the symptoms worsen or if the condition fails to improve as anticipated.  Other Instructions Prednisone prescribed for facial swelling Augmentin prescribed.  Take as directed and to completion Recommend a soft diet  Maintain proper dental hygiene Follow up with dentist as soon as possible for further evaluation and treatment   If you have progressive symptoms or fever, chest pain, shortness of breath, difficulty swallowing or breathing please follow up in person at urgent care, the emergency room or call 911   If you have been instructed to have an in-person evaluation today at a local Urgent Care facility, please use the link below. It will take you to a list of all of our available [facility], including address, phone number and hours of operation. Please do  not delay care.  [facility]  If you or a family member do not have a primary care provider, use the link below to schedule a visit and establish care. When you choose a [facility] primary care physician or advanced practice provider, you gain a long-term partner in health. Find a Primary Care Provider  Learn more about [facility]'s in-office and virtual care options: [facility] - Get Care Now

## 2021-04-23 NOTE — Progress Notes (Signed)
Virtual Visit Consent   Jordan [name], you are scheduled for a virtual visit with a [facility] provider today.     Just as with appointments in the office, your consent must be obtained to participate.  Your consent will be active for this visit and any virtual visit you may have with one of our providers in the next 365 days.     If you have a MyChart account, a copy of this consent can be sent to you electronically.  All virtual visits are billed to your insurance company just like a traditional visit in the office.    As this is a virtual visit, video technology does not allow for your provider to perform a traditional examination.  This may limit your provider's ability to fully assess your condition.  If your provider identifies any concerns that need to be evaluated in person or the need to arrange testing (such as labs, EKG, etc.), we will make arrangements to do so.     Although advances in technology are sophisticated, we cannot ensure that it will always work on either your end or our end.  If the connection with a video visit is poor, the visit may have to be switched to a telephone visit.  With either a video or telephone visit, we are not always able to ensure that we have a secure connection.     I need to obtain your verbal consent now.   Are you willing to proceed with your visit today? Yes   Artie Mcintyre has provided verbal consent on 04/23/2021 for a virtual visit (video or telephone).   Jordan [name], [place]   Date: 04/23/2021 6:22 PM   Virtual Visit via Video Note   I, Jordan [name], connected with  Jordan [name]  ([id number], 04-06-1976) on 04/23/21 at  6:15 PM EST by a video-enabled telemedicine application and verified that I am speaking with the correct person using two identifiers.  Location: Patient: Virtual Visit Location Patient: Home Provider: Virtual Visit Location Provider: Home Office   I discussed the limitations of evaluation and management by telemedicine  and the availability of in person appointments. The patient expressed understanding and agreed to proceed.    History of Present Illness: Jordan [name] is a 45 y.o. who identifies as a male who was assigned male at birth, and is being seen today for facial swelling x 4 days.  Denies precipitating event or trauma.  Reports sore along gum line.  Has tried OTC medications without relief.  Worse to the touch, eating drinking, and chewing.  Denies previous symptoms in the past.  Denies fever, chills, nausea, vomiting, difficulty breathing, difficulty swallowing.    HPI: HPI  Problems:  Patient Active Problem List   Diagnosis Date Noted   Erectile dysfunction 11/02/2020   GAD (generalized anxiety disorder) 04/01/2020   Elevated LDL cholesterol level 07/29/2019   SOB (shortness of breath) 07/27/2019   Seasonal and perennial allergic rhinoconjunctivitis 07/20/2019   Asthma 07/20/2019   Heartburn 07/20/2019   Stress and adjustment reaction 07/14/2019   Healthcare maintenance 07/14/2019   Hypertension 07/14/2019    Allergies: No Known Allergies Medications:  Current Outpatient Medications:    albuterol (PROAIR HFA) 108 (90 Base) MCG/ACT inhaler, Inhale 1-2 puffs into the lungs every 6 (six) hours as needed for wheezing or shortness of breath., Disp: 18 g, Rfl: 1   amLODipine (NORVASC) 10 MG tablet, Take 1 tablet (10 mg total) by mouth daily., Disp: 90 tablet, Rfl: 3  azelastine (ASTELIN) 0.1 % nasal spray, 2 sprays per nostril 2 times daily as needed for drainage., Disp: 30 mL, Rfl: 5   beclomethasone (QVAR REDIHALER) 80 MCG/ACT inhaler, Inhale 2 puffs into the lungs 2 (two) times daily. Rinse mouth out after each use., Disp: 21.2 g, Rfl: 3   EPINEPHrine 0.3 mg/0.3 mL IJ SOAJ injection, INJECT 0.3 MLS INTO MUSCLE, Disp: 1 each, Rfl: 2   escitalopram (LEXAPRO) 20 MG tablet, Take 1 tablet (20 mg total) by mouth daily., Disp: 90 tablet, Rfl: 3   fenofibrate (TRICOR) 145 MG tablet, Take 1 tablet (145  mg total) by mouth daily., Disp: 90 tablet, Rfl: 1   ipratropium (ATROVENT) 0.06 % nasal spray, Place 2 sprays into both nostrils 3 (three) times daily., Disp: , Rfl:    losartan (COZAAR) 25 MG tablet, Take 1 tablet (25 mg total) by mouth daily., Disp: 30 tablet, Rfl: 5   montelukast (SINGULAIR) 10 MG tablet, TAKE 1 TABLET BY MOUTH EVERYDAY AT BEDTIME, Disp: 30 tablet, Rfl: 5   NUCALA 100 MG SOLR, Inject into the skin., Disp: , Rfl:    sucralfate (CARAFATE) 1 g tablet, Take 1 tablet (1 g total) by mouth 4 (four) times daily -  with meals and at bedtime., Disp: 120 tablet, Rfl: 3  Current Facility-Administered Medications:    Mepolizumab SOLR 100 mg, 100 mg, Subcutaneous, Q28 days, Marcelyn Bruins, MD, 100 mg at 03/27/21 1131  Observations/Objective: Patient is well-developed, well-nourished in no acute distress.  Resting comfortably at home.  Head is normocephalic, atraumatic. RT sided facial swelling No labored breathing. Speaking in full sentences and tolerating own secretions Speech is clear and coherent with logical content.  Patient is alert and oriented at baseline.   Assessment and Plan: 1. Facial swelling  2. Gum abscess Prednisone prescribed for facial swelling Augmentin prescribed.  Take as directed and to completion Recommend a soft diet  Maintain proper dental hygiene Follow up with dentist as soon as possible for further evaluation and treatment   If you have progressive symptoms or fever, chest pain, shortness of breath, difficulty swallowing or breathing please follow up in person at urgent care, the emergency room or call 911  Follow Up Instructions: I discussed the assessment and treatment plan with the patient. The patient was provided an opportunity to ask questions and all were answered. The patient agreed with the plan and demonstrated an understanding of the instructions.  A copy of instructions were sent to the patient via MyChart unless otherwise noted  below.   The patient was advised to call back or seek an in-person evaluation if the symptoms worsen or if the condition fails to improve as anticipated.  Time:  I spent 10 minutes with the patient via telehealth technology discussing the above problems/concerns.    Jordan Harding, PA-C

## 2021-04-24 ENCOUNTER — Other Ambulatory Visit: Payer: Self-pay | Admitting: Nurse Practitioner

## 2021-04-24 DIAGNOSIS — E781 Pure hyperglyceridemia: Secondary | ICD-10-CM

## 2021-04-26 ENCOUNTER — Ambulatory Visit: Payer: 59

## 2021-04-26 NOTE — Telephone Encounter (Signed)
Chart supports Rx Last seen 02/02/21 No future appointments scheduled.

## 2021-05-08 ENCOUNTER — Ambulatory Visit: Payer: 59

## 2021-05-10 ENCOUNTER — Ambulatory Visit: Payer: 59

## 2021-05-15 ENCOUNTER — Ambulatory Visit (INDEPENDENT_AMBULATORY_CARE_PROVIDER_SITE_OTHER): Payer: 59

## 2021-05-15 ENCOUNTER — Other Ambulatory Visit: Payer: Self-pay

## 2021-05-15 DIAGNOSIS — J4550 Severe persistent asthma, uncomplicated: Secondary | ICD-10-CM | POA: Diagnosis not present

## 2021-05-23 ENCOUNTER — Telehealth: Payer: Self-pay | Admitting: Allergy

## 2021-05-23 ENCOUNTER — Telehealth: Payer: 59 | Admitting: Physician Assistant

## 2021-05-23 DIAGNOSIS — J4541 Moderate persistent asthma with (acute) exacerbation: Secondary | ICD-10-CM | POA: Diagnosis not present

## 2021-05-23 MED ORDER — PREDNISONE 20 MG PO TABS: 40.0000 mg | ORAL_TABLET | Freq: Every day | ORAL | 0 refills | Status: DC

## 2021-05-23 NOTE — Progress Notes (Signed)
Visit for Asthma  Based on what you have shared with me, it looks like you may have a flare up of your asthma.  Asthma is a chronic (ongoing) lung disease which results in airway obstruction, inflammation and hyper-responsiveness.   Asthma symptoms vary from person to person, with common symptoms including nighttime awakening and decreased ability to participate in normal activities as a result of shortness of breath. It is often triggered by changes in weather, changes in the season, changes in air temperature, or inside (home, school, daycare or work) allergens such as animal dander, mold, mildew, woodstoves or cockroaches.   It can also be triggered by hormonal changes, extreme emotion, physical exertion or an upper respiratory tract illness.     It is important to identify the trigger, and then eliminate or avoid the trigger if possible.   If you have been prescribed medications to be taken on a regular basis, it is important to follow the asthma action plan and to follow guidelines to adjust medication in response to increasing symptoms of decreased peak expiratory flow rate  Treatment: I have prescribed: Prednisone 40mg by mouth per day for 5 - 7 days  HOME CARE Only take medications as instructed by your medical team. Consider wearing a mask or scarf to improve breathing air temperature have been shown to decrease irritation and decrease exacerbations Get rest. Taking a steamy shower or using a humidifier may help nasal congestion sand ease sore throat pain. You can place a towel over your head and breathe in the steam from hot water coming from a faucet. Using a saline nasal spray works much the same way.  Cough drops, hare candies and sore throat lozenges may ease your cough.  Avoid close contacts especially the very you and the elderly Cover your mouth if you cough or  sneeze Always remember to wash your hands.    GET HELP RIGHT AWAY IF: You develop worsening symptoms; breathlessness at rest, drowsy, confused or agitated, unable to speak in full sentences You have coughing fits You develop a severe headache or visual changes You develop shortness of breath, difficulty breathing or start having chest pain Your symptoms persist after you have completed your treatment plan If your symptoms do not improve within 10 days  MAKE SURE YOU Understand these instructions. Will watch your condition. Will get help right away if you are not doing well or get worse.   Your e-visit answers were reviewed by a board certified advanced clinical practitioner to complete your personal care plan, Depending upon the condition, your plan could have included both over the counter or prescription medications.   Please review your pharmacy choice. Your safety is important to us. If you have drug allergies check your prescription carefully.  You can use MyChart to ask questions about today's visit, request a non-urgent  call back, or ask for a work or school excuse for 24 hours related to this e-Visit. If it has been greater than 24 hours you will need to follow up with your provider, or enter a new e-Visit to address those concerns.   You will get an e-mail in the next two days asking about your experience. I hope that your e-visit has been valuable and will speed your recovery. Thank you for using e-visits.  I provided 5 minutes of non face-to-face time during this encounter for chart review and documentation.   

## 2021-05-23 NOTE — Telephone Encounter (Signed)
Spoke to patient and he stated he has a hard time breathing, has to sniff just to breath, Coughing really bad can't catch his breath when he walks outside. He's been taking decongestant medication for the last 4 days with no relief. He wants to know if something can be sent in for it. He's been having these symptoms for the last 4 days. Pharmacy [facility] [phone]

## 2021-05-23 NOTE — Telephone Encounter (Signed)
Patient called and would like to talk with someone about his asthma flare and the coughing he has. He does not have any fever that he know of. [facility] [address] [phone]

## 2021-05-24 ENCOUNTER — Other Ambulatory Visit: Payer: Self-pay | Admitting: Nurse Practitioner

## 2021-05-24 DIAGNOSIS — F411 Generalized anxiety disorder: Secondary | ICD-10-CM

## 2021-05-24 NOTE — Telephone Encounter (Signed)
Patient has the ipratropium nasal spray and the Astelin nasal spray. He states that he does not have much left on both and is really needing an office visit. He has no showed or cancelled many of the OV visits. Is it possible to schedule him to be seen when his is coming in for his shot on 06/12/20 at 4:00pm for his immunotherapy shot. Please advise on courtesy refill for nasal spray and office visit availability.

## 2021-05-24 NOTE — Telephone Encounter (Signed)
Patient has been having issues with a dry nose with some nose bleeds. He has been using an over the counter sinus mediation with a decongestant in it. The symptoms have been going on for five days including today. He picked up the prednisone late yesterday and only took 1 tablet. He plans to take two today. The prednisone is for five days with 14 tablets total.

## 2021-05-24 NOTE — Telephone Encounter (Signed)
It looks like he had prednisone sent in yesterday by a Joycelyn Man.  I am not not going to send any more in.  Sorry I was off yesterday and did not get this message until today.  Malachi Bonds, MD [facility]

## 2021-05-24 NOTE — Telephone Encounter (Signed)
Sounds good. Agree with plan. He has a number of other nose sprays he can try at home as well.   Routing his Primary Asthma/Allergy Physician - Dr. Margo Aye - into the conversation as well to keep her up to date.  Malachi Bonds, MD [facility] [place] [facility] [place]

## 2021-05-25 ENCOUNTER — Telehealth: Payer: 59 | Admitting: Nurse Practitioner

## 2021-05-25 DIAGNOSIS — J4541 Moderate persistent asthma with (acute) exacerbation: Secondary | ICD-10-CM

## 2021-05-25 MED ORDER — QVAR REDIHALER 80 MCG/ACT IN AERB: 2.0000 | INHALATION_SPRAY | Freq: Two times a day (BID) | RESPIRATORY_TRACT | 0 refills | Status: DC

## 2021-05-25 MED ORDER — ALBUTEROL SULFATE HFA 108 (90 BASE) MCG/ACT IN AERS: 1.0000 | INHALATION_SPRAY | Freq: Four times a day (QID) | RESPIRATORY_TRACT | 0 refills | Status: DC | PRN

## 2021-05-25 MED ORDER — ALBUTEROL SULFATE HFA 108 (90 BASE) MCG/ACT IN AERS: 1.0000 | INHALATION_SPRAY | Freq: Four times a day (QID) | RESPIRATORY_TRACT | 1 refills | Status: DC | PRN

## 2021-05-25 NOTE — Telephone Encounter (Signed)
I called the patient back about the prednisone medication interacting with his other medications. He stated it was reacting with his anxiety medication dn blood pressure medication. I informed him since we did not send in the prednisone there is no other medication that we can provide as an alternative. He stated that he sent the other office a my chart message about his issues and has not heard back so he called our office. I told him to make sure to call the other office to make sure they received his message.    Please advise on making an office visit on the patient's next injection visit. Patient is low on medications and needs to be seen for refills. His shot appointment is on 06/12/21 at the [facility] office.

## 2021-05-25 NOTE — Progress Notes (Signed)
Visit for Asthma  Based on what you have shared with me, it looks like you may have a flare up of your asthma.  Asthma is a chronic (ongoing) lung disease which results in airway obstruction, inflammation and hyper-responsiveness.   Asthma symptoms vary from person to person, with common symptoms including nighttime awakening and decreased ability to participate in normal activities as a result of shortness of breath. It is often triggered by changes in weather, changes in the season, changes in air temperature, or inside (home, school, daycare or work) allergens such as animal dander, mold, mildew, woodstoves or cockroaches.   It can also be triggered by hormonal changes, extreme emotion, physical exertion or an upper respiratory tract illness.     It is important to identify the trigger, and then eliminate or avoid the trigger if possible.   If you have been prescribed medications to be taken on a regular basis, it is important to follow the asthma action plan and to follow guidelines to adjust medication in response to increasing symptoms of decreased peak expiratory flow rate  Treatment: I have prescribed: Albuterol (Proventil HFA; Ventolin HFA) 108 (90 Base) MCG/ACT Inhaler 2 puffs into the lungs every six hours as needed for wheezing or shortness of breath and I have also refilled your QVAR inhaler.    HOME CARE Only take medications as instructed by your medical team. Consider wearing a mask or scarf to improve breathing air temperature have been shown to decrease irritation and decrease exacerbations Get rest. Taking a steamy shower or using a humidifier may help nasal congestion sand ease sore throat pain. You can place a towel over your head and breathe in the steam from hot water coming from a faucet. Using a saline nasal spray works much the same way.  Cough drops, hare  candies and sore throat lozenges may ease your cough.  Avoid close contacts especially the very you and the elderly Cover your mouth if you cough or sneeze Always remember to wash your hands.    GET HELP RIGHT AWAY IF: You develop worsening symptoms; breathlessness at rest, drowsy, confused or agitated, unable to speak in full sentences You have coughing fits You develop a severe headache or visual changes You develop shortness of breath, difficulty breathing or start having chest pain Your symptoms persist after you have completed your treatment plan If your symptoms do not improve within 10 days  MAKE SURE YOU Understand these instructions. Will watch your condition. Will get help right away if you are not doing well or get worse.   Your e-visit answers were reviewed by a board certified advanced clinical practitioner to complete your personal care plan, Depending upon the condition, your plan could have included both over the counter or prescription medications.   Please review your pharmacy choice. Your safety is important to [place]. If you have drug allergies check your prescription carefully.  You can use MyChart to ask questions about today's visit, request a non-urgent  call back, or ask for a work or school excuse for 24 hours related to this e-Visit. If it has been greater than 24 hours you will need to follow up with your provider, or enter a new e-Visit to address those concerns.   You will get an e-mail in the next two days asking about your experience. I hope that your e-visit has been valuable and will speed your recovery. Thank you for using e-visits.

## 2021-05-25 NOTE — Telephone Encounter (Signed)
Called and left a voicemail asking for patient to return call to discuss. He was sent in refills of his albuterol and qvar to the pharmacy by the NP he did the visit with, but no antibiotics. Chrissie does have openings on January 16th and we could get him scheduled then to see her since that coordinated with the day that he is scheduled to receive his Nucala injection.

## 2021-05-25 NOTE — Progress Notes (Signed)
I have spent 5 minutes in review of e-visit questionnaire, review and updating patient chart, medical decision making and response to patient.  ° °Kiri Hinderliter W Jozi Malachi, NP ° °  °

## 2021-05-25 NOTE — Telephone Encounter (Signed)
I would try halving the dose of prednisone.  I do not want to send in any antibiotics since I have not seen him.  I would either reach out to the PA that he saw on the 27th or call his primary care provider.    It looks like he is or has seen another nurse practitioner via an televisit today.  Did that nurse practitioner recommended an antibiotic?   Polypharmacy might be an issue with all of the visits he is having.  Malachi Bonds, MD [facility] [place]

## 2021-05-25 NOTE — Telephone Encounter (Signed)
Peretz called in and states that prednisone is interacting with his other medications and is making his heart rate "alarmaly high" and wants to know if something other than prednisone can be called in?  Please advise.  [facility] on [address]

## 2021-05-26 ENCOUNTER — Telehealth: Payer: 59 | Admitting: Physician Assistant

## 2021-05-26 DIAGNOSIS — R040 Epistaxis: Secondary | ICD-10-CM

## 2021-05-26 NOTE — Progress Notes (Signed)
Based on the information that you have shared in the e-Visit Questionnaire, we recommend that you convert this visit to a video visit in order for the provider to better assess what is going on.  The provider will be able to give you a more accurate diagnosis and treatment plan if we can more freely discuss your symptoms and with the addition of a virtual examination.   If you convert to a video visit, we will bill your insurance (similar to an office visit) and you will not be charged for this e-Visit. You will be able to stay at home and speak with the first available [facility] advanced practice provider. The link to do a video visit is in the drop down Menu tab of your Welcome screen in MyChart.    Greater than 5 minutes, yet less than 10 minutes of time have been spent researching, coordinating, and implementing care for this patient today

## 2021-05-28 ENCOUNTER — Other Ambulatory Visit: Payer: Self-pay | Admitting: Nurse Practitioner

## 2021-05-29 NOTE — Telephone Encounter (Signed)
Called and spoke with patient. He has been scheduled to come in on the same day of his Nucala injection to see Chrissie for medication refills.

## 2021-06-11 NOTE — Patient Instructions (Addendum)
Asthma:  Daily controller medication(s):  Start prednisone 10 mg taking 2 tablets twice a day for 3 days, then on the fourth day take 2 tablets in the morning, and on the fifth day take 1 tablet and stop Restart Breztri 2 puffs twice a day and restart Qvar 2 puffs twice a day with spacer and rinse mouth afterwards.  It is important to take these medications every day and to keep your follow-up appointments Continue Singulair 10mg  daily at night.  Prior to physical activity: May use albuterol rescue inhaler 2 puffs 5 to 15 minutes prior to strenuous physical activities. Rescue medications: May use albuterol rescue inhaler 2 puffs or nebulizer every 4 to 6 hours as needed for shortness of breath, chest tightness, coughing, and wheezing. Monitor frequency of use. Continue Nucala injections monthly for now.    Control goals:  Full participation in all desired activities (may need albuterol before activity) Albuterol use two times or less a week on average (not counting use with activity) Cough interfering with sleep two times or less a month Oral steroids no more than once a year  Complications of Chronic Steroid Use     Heartburn Restart pantoprazole 20mg  in the morning. Nothing to eat or drink for 30 minutes.  Seasonal and perennial allergic rhinoconjunctivitis Past history - 2021 skin testing was positive to grass, tree, mold, dust mites.  Start nasal saline rinses Continue with nasal Atrovent 2 sprays each nostril up to 3-4 times a day as needed for nasal congestion and drainage Continue nasal Asteline 2 sprays each nostril twice a day for nasal drainage Truett Perna has not been effective May take an over the counter antihistamine such as Claritin, Allegra, Xyzal, or Zyrtec once a day as needed for runny nose Continue Singulair  10mg  nightly  Epistaxis Pinch both nostrils while leaning forward for at least 5 minutes before checking to see if the bleeding has stopped. If bleeding is not  controlled within 5-10 minutes apply a cotton ball soaked with oxymetazoline (Afrin) to the bleeding nostril for a few seconds.  If the problem persists or worsens a referral to ENT for further evaluation may be necessary. Schedule an appointment with your ENT to discuss your frequent nosebleeds  Recurrent infections Keep track of current infections and number of antibiotics needed Consider getting blood work at your next office visit   Follow up in 4 to 6 weeks with Dr. Nelva Bush or sooner if needed

## 2021-06-12 ENCOUNTER — Ambulatory Visit: Payer: 59

## 2021-06-12 ENCOUNTER — Encounter: Payer: Self-pay | Admitting: Family

## 2021-06-12 ENCOUNTER — Other Ambulatory Visit: Payer: Self-pay

## 2021-06-12 ENCOUNTER — Ambulatory Visit (INDEPENDENT_AMBULATORY_CARE_PROVIDER_SITE_OTHER): Payer: 59 | Admitting: Family

## 2021-06-12 VITALS — HR 99 | Temp 98.2°F | Resp 18 | Ht 69.0 in | Wt 193.0 lb

## 2021-06-12 DIAGNOSIS — J4541 Moderate persistent asthma with (acute) exacerbation: Secondary | ICD-10-CM | POA: Diagnosis not present

## 2021-06-12 DIAGNOSIS — Z91199 Patient's noncompliance with other medical treatment and regimen due to unspecified reason: Secondary | ICD-10-CM

## 2021-06-12 DIAGNOSIS — R12 Heartburn: Secondary | ICD-10-CM

## 2021-06-12 DIAGNOSIS — J302 Other seasonal allergic rhinitis: Secondary | ICD-10-CM

## 2021-06-12 DIAGNOSIS — H1013 Acute atopic conjunctivitis, bilateral: Secondary | ICD-10-CM

## 2021-06-12 DIAGNOSIS — R040 Epistaxis: Secondary | ICD-10-CM

## 2021-06-12 DIAGNOSIS — H1010 Acute atopic conjunctivitis, unspecified eye: Secondary | ICD-10-CM

## 2021-06-12 MED ORDER — AZELASTINE HCL 0.1 % NA SOLN: NASAL | 5 refills | Status: DC

## 2021-06-12 MED ORDER — IPRATROPIUM BROMIDE 0.06 % NA SOLN: NASAL | 5 refills | Status: DC

## 2021-06-12 MED ORDER — MONTELUKAST SODIUM 10 MG PO TABS: ORAL_TABLET | ORAL | 5 refills | Status: DC

## 2021-06-12 MED ORDER — AEROCHAMBER PLUS FLO-VU LARGE MISC: 1.0000 | Freq: Once | 0 refills | Status: AC

## 2021-06-12 MED ORDER — QVAR REDIHALER 80 MCG/ACT IN AERB: 2.0000 | INHALATION_SPRAY | Freq: Two times a day (BID) | RESPIRATORY_TRACT | 5 refills | Status: DC

## 2021-06-12 MED ORDER — BREZTRI AEROSPHERE 160-9-4.8 MCG/ACT IN AERO: 2.0000 | INHALATION_SPRAY | Freq: Two times a day (BID) | RESPIRATORY_TRACT | 5 refills | Status: DC

## 2021-06-12 MED ORDER — ALBUTEROL SULFATE HFA 108 (90 BASE) MCG/ACT IN AERS: 1.0000 | INHALATION_SPRAY | Freq: Four times a day (QID) | RESPIRATORY_TRACT | 1 refills | Status: DC | PRN

## 2021-06-12 MED ORDER — PANTOPRAZOLE SODIUM 20 MG PO TBEC: 20.0000 mg | DELAYED_RELEASE_TABLET | Freq: Every day | ORAL | 3 refills | Status: DC

## 2021-06-12 NOTE — Progress Notes (Signed)
[address] [place] [id number] Dept: [phone]  FOLLOW UP NOTE  Patient ID: Jordan [name], male    DOB: 10/11/1975  Age: 46 y.o. MRN: [id number] Date of Office Visit: 06/12/2021  Assessment  Chief Complaint: Allergic Rhinitis  (Nose bleeds - for several weeks ) and Asthma (Shortness of breath, wheezing, and constant coughing )  HPI Jordan [name] is a 46 year old male who presents today for follow-up of asthma exacerbation due to viral COVID illness, heartburn, and seasonal and perennial allergic rhinoconjunctivitis.  He was last seen on July 06, 2020 by Dr. Delorse LekPadgett.  Since his last office visit he denies any new diagnoses or surgeries.  Asthma is reported as not controlled with albuterol as needed and Nucala injections every 4 weeks.  He has not been consistent in getting his Nucala injections every 4 weeks.  He reports that he has been out of Breztri , Qvar, and Singulair for months. After looking in Epic his primary care physician refilled his Qvar on 05/25/21.  He reports coughing throughout the day especially in the morning to the point where he is almost sick.  The cough is described as dry.  He also reports wheezing, tightness in his chest, and shortness of breath, he denies nocturnal awakenings, fever, and chills.  He has been using his albuterol every 2 hours and his albuterol in his nebulizer does more for him than the handheld albuterol.  Since his last office visit he denies any trips to the emergency room or urgent care due to breathing problems but does mention that he had a televisit for his breathing over a month ago.  He reports being on 2 steroids that he can recall of since his last office visit.  He has not been to see pulmonary since his last office visit.  Heartburn is reported as not well controlled.  He is currently out of pantoprazole.  He does feel like the pantoprazole is better than the sucralfate and omeprazole that he has used in the past.  Seasonal and  perennial allergic rhinoconjunctivitis is reported as not well controlled with Mucinex DM, Atrovent nasal spray, and Astelin nasal spray.  He reports that these 2 nasal sprays do not help and he barely has any left.  He reports that he is pretty sure he has postnasal drip because his stomach feels upset, nasal congestion and rhinorrhea.  He mentions every time he blows his nose he sees clots also it is darkish gray in color.  He reports that he has seen ENT in the past, but has not called to schedule an appointment due to his nosebleeds.  He reports that he has probably had 1 sinus infection since we last saw him.  He also reports that he was given 1 round of steroids when his face had swollen up and they felt that it was due to his sinuses. After reviewing Epic he was given prednisone and Augmentin on 04/23/21  for gum abscess and instructed to follow up with his dentist.   Drug Allergies:  No Known Allergies  Review of Systems: Review of Systems  Constitutional:  Negative for chills and fever.  HENT:         Reports he is pretty sure he has postnasal drip due to his stomach feeling upset, nasal congestion, and rhinorrhea that can be darkish gray and when he blows his nose he sees clots of blood  Eyes:        Reports watery eyes and denies itchy eyes  Respiratory:  Positive for cough, shortness of breath and wheezing.   Cardiovascular:  Negative for chest pain and palpitations.  Gastrointestinal:  Positive for heartburn.  Genitourinary:  Negative for frequency.  Skin:  Negative for itching and rash.  Neurological:  Positive for headaches.  Endo/Heme/Allergies:  Positive for environmental allergies.    Physical Exam: Pulse 99    Temp 98.2 F (36.8 C)    Resp 18    Ht 5\' 9"  (1.753 m)    Wt 193 lb (87.5 kg)    SpO2 97%    BMI 28.50 kg/m    Physical Exam Constitutional:      Appearance: Normal appearance.  HENT:     Head: Normocephalic and atraumatic.     Comments: Pharynx normal, eyes  normal, ears right ear normal, left ear unable to see tympanic membrane due to cerumen.  Nose: Bilateral lower turbinates moderately edematous and slightly erythematous with clear drainage noted.  Small irritation with a small amount of blood noted in left nostril.    Right Ear: Tympanic membrane, ear canal and external ear normal.     Left Ear: Ear canal and external ear normal.     Mouth/Throat:     Mouth: Mucous membranes are moist.     Pharynx: Oropharynx is clear.  Eyes:     Conjunctiva/sclera: Conjunctivae normal.  Cardiovascular:     Rate and Rhythm: Regular rhythm.     Heart sounds: Normal heart sounds.  Pulmonary:     Effort: Pulmonary effort is normal.     Breath sounds: Normal breath sounds.     Comments: Lungs clear to auscultation.  Able to speak in full sentences. Skin:    General: Skin is warm.  Neurological:     Mental Status: He is alert and oriented to person, place, and time.  Psychiatric:        Mood and Affect: Mood normal.        Behavior: Behavior normal.        Thought Content: Thought content normal.        Judgment: Judgment normal.    Diagnostics: FVC 2.42 L, FEV1 1.89 L.  Predicted FVC 4.33 L, predicted FEV1 3.47 L.  Spirometry indicates possible moderately severe restriction. Spirometry is decreased from last office visit.  Assessment and Plan: 1. Compliance poor   2. Moderate persistent asthma with acute exacerbation   3. Heartburn   4. Seasonal and perennial allergic rhinoconjunctivitis   5. Epistaxis     Meds ordered this encounter  Medications   azelastine (ASTELIN) 0.1 % nasal spray    Sig: 2 sprays per nostril 2 times daily as needed for drainage.    Dispense:  30 mL    Refill:  5   montelukast (SINGULAIR) 10 MG tablet    Sig: TAKE 1 TABLET BY MOUTH EVERYDAY AT BEDTIME    Dispense:  30 tablet    Refill:  5   Budeson-Glycopyrrol-Formoterol (BREZTRI AEROSPHERE) 160-9-4.8 MCG/ACT AERO    Sig: Inhale 2 puffs into the lungs in the morning  and at bedtime.    Dispense:  10.7 g    Refill:  5   beclomethasone (QVAR REDIHALER) 80 MCG/ACT inhaler    Sig: Inhale 2 puffs into the lungs 2 (two) times daily. Rinse mouth out after each use.    Dispense:  10.6 g    Refill:  5   ipratropium (ATROVENT) 0.06 % nasal spray    Sig: Place 2 sprays in each nostril up  to three times a day as needed    Dispense:  15 mL    Refill:  5   albuterol (PROVENTIL HFA) 108 (90 Base) MCG/ACT inhaler    Sig: Inhale 1-2 puffs into the lungs every 6 (six) hours as needed for wheezing or shortness of breath.    Dispense:  18 g    Refill:  1    Please fill the inhaler that his insurance will cover   Spacer/Aero-Holding Chambers (AEROCHAMBER PLUS FLO-VU LARGE) MISC    Sig: 1 each by Other route once for 1 dose.    Dispense:  1 each    Refill:  0    Dispense brand that is covered by insurance   pantoprazole (PROTONIX) 20 MG tablet    Sig: Take 1 tablet (20 mg total) by mouth daily.    Dispense:  30 tablet    Refill:  3    Patient Instructions  Asthma:  Daily controller medication(s):  Start prednisone 10 mg taking 2 tablets twice a day for 3 days, then on the fourth day take 2 tablets in the morning, and on the fifth day take 1 tablet and stop Restart Breztri 2 puffs twice a day and restart Qvar 2 puffs twice a day with spacer and rinse mouth afterwards.  It is important to take these medications every day and to keep your follow-up appointments Continue Singulair 10mg  daily at night.  Prior to physical activity: May use albuterol rescue inhaler 2 puffs 5 to 15 minutes prior to strenuous physical activities. Rescue medications: May use albuterol rescue inhaler 2 puffs or nebulizer every 4 to 6 hours as needed for shortness of breath, chest tightness, coughing, and wheezing. Monitor frequency of use. Continue Nucala injections monthly for now.    Control goals:  Full participation in all desired activities (may need albuterol before  activity) Albuterol use two times or less a week on average (not counting use with activity) Cough interfering with sleep two times or less a month Oral steroids no more than once a year  Complications of Chronic Steroid Use     Heartburn Restart pantoprazole 20mg  in the morning. Nothing to eat or drink for 30 minutes.  Seasonal and perennial allergic rhinoconjunctivitis Past history - 2021 skin testing was positive to grass, tree, mold, dust mites.  Start nasal saline rinses Continue with nasal Atrovent 2 sprays each nostril up to 3-4 times a day as needed for nasal congestion and drainage Continue nasal Asteline 2 sprays each nostril twice a day for nasal drainage has not been effective May take an over the counter antihistamine such as Claritin, Allegra, Xyzal, or Zyrtec once a day as needed for runny nose Continue Singulair  10mg  nightly  Epistaxis Pinch both nostrils while leaning forward for at least 5 minutes before checking to see if the bleeding has stopped. If bleeding is not controlled within 5-10 minutes apply a cotton ball soaked with oxymetazoline (Afrin) to the bleeding nostril for a few seconds.  If the problem persists or worsens a referral to ENT for further evaluation may be necessary. Schedule an appointment with your ENT to discuss your frequent nosebleeds  Recurrent infections Keep track of current infections and number of antibiotics needed Consider getting blood work at your next office visit   Follow up in 4 to 6 weeks with Dr. Timmothy Sours or sooner if needed  Return in about 4 weeks (around 07/10/2021), or if symptoms worsen or fail to improve.  Thank you for the opportunity to care for this patient.  Please do not hesitate to contact me with questions.  Nehemiah Settle, FNP [facility]

## 2021-06-14 ENCOUNTER — Telehealth: Payer: Self-pay | Admitting: Family

## 2021-06-14 ENCOUNTER — Telehealth: Payer: Self-pay | Admitting: Nurse Practitioner

## 2021-06-14 NOTE — Telephone Encounter (Signed)
Patient states that he believes that the Lexapro and Prednisone are not meshing well and he is having a lot of jitteriness and is not feeling well. He states that since starting the Prednisone he has been feeling light headed and having really bad headaches since starting the Prednisone yesterday. He called his PCP and asked if he can take the Lexapro with the prednisone but has not heard back yet. I advised to stop taking the prednisone for now.

## 2021-06-14 NOTE — Telephone Encounter (Signed)
Called and spoke with the patient and advised of Dr. Jeralyn Ruths plan. He asked what could he do until then since he has to stop the prednisone, I advised to restart the Judithann Sauger and QVAR he states the pharmacy stated they had to order it. I advised I would put some samples of Breztri up front for him to pick up and he can use that until he follows up and picks up the prescriptions. Patient verbalized understanding.

## 2021-06-14 NOTE — Telephone Encounter (Signed)
Called patient and left a voicemail asking for him to return call to inform.

## 2021-06-14 NOTE — Telephone Encounter (Signed)
Patient would like a call back. Patient states he does not feel well after taking prednisone.

## 2021-06-15 NOTE — Telephone Encounter (Signed)
Patient notified VIA phone. No further questions. Dm/cma  

## 2021-06-15 NOTE — Telephone Encounter (Signed)
Wife picked up samples

## 2021-06-23 ENCOUNTER — Telehealth: Payer: 59 | Admitting: Nurse Practitioner

## 2021-06-23 DIAGNOSIS — M79605 Pain in left leg: Secondary | ICD-10-CM

## 2021-06-23 MED ORDER — NAPROXEN 500 MG PO TABS: 500.0000 mg | ORAL_TABLET | Freq: Two times a day (BID) | ORAL | 1 refills | Status: DC

## 2021-06-23 NOTE — Progress Notes (Signed)
Virtual Visit Consent   Jordan [name], you are scheduled for a virtual visit with [name], [place], a [facility] provider, today.     Just as with appointments in the office, your consent must be obtained to participate.  Your consent will be active for this visit and any virtual visit you may have with one of our providers in the next 365 days.     If you have a MyChart account, a copy of this consent can be sent to you electronically.  All virtual visits are billed to your insurance company just like a traditional visit in the office.    As this is a virtual visit, video technology does not allow for your provider to perform a traditional examination.  This may limit your provider's ability to fully assess your condition.  If your provider identifies any concerns that need to be evaluated in person or the need to arrange testing (such as labs, EKG, etc.), we will make arrangements to do so.     Although advances in technology are sophisticated, we cannot ensure that it will always work on either your end or our end.  If the connection with a video visit is poor, the visit may have to be switched to a telephone visit.  With either a video or telephone visit, we are not always able to ensure that we have a secure connection.     I need to obtain your verbal consent now.   Are you willing to proceed with your visit today? YES   [name] has provided verbal consent on 06/23/2021 for a virtual visit (video or telephone).   [name], FNP   Date: 06/23/2021 4:01 PM   Virtual Visit via Video Note   I, [name], connected with Laik Kellum ([id number], 02-Mar-1976) on 06/23/21 at  4:15 PM EST by a video-enabled telemedicine application and verified that I am speaking with the correct person using two identifiers.  Location: Patient: Virtual Visit Location Patient: Home Provider: Virtual Visit Location Provider: Mobile   I discussed the limitations of evaluation  and management by telemedicine and the availability of in person appointments. The patient expressed understanding and agreed to proceed.    History of Present Illness: Jordan [name] is a 46 y.o. who identifies as a male who was assigned male at birth, and is being seen today for leg pain.  HPI: Leg Pain  Incident onset: Patient says that the right leg started hurting several months ago. He doe snot remember an injury. The pain is present in the left leg. The quality of the pain is described as aching and shooting. The pain is at a severity of 8/10. The pain is moderate. The pain has been Constant (started out intermittent) since onset. Associated symptoms include an inability to bear weight. The symptoms are aggravated by movement and weight bearing. He has tried acetaminophen for the symptoms. The treatment provided mild relief. He says pain is the whole entire leg. Has swelling in thigh down to knee.  Foot warm and has a pulse.   Just took prednisone for his asthma  Review of Systems  Constitutional:  Negative for chills and fever.  Musculoskeletal:  Positive for myalgias.   Problems:  Patient Active Problem List   Diagnosis Date Noted   Erectile dysfunction 11/02/2020   GAD (generalized anxiety disorder) 04/01/2020   Elevated LDL cholesterol level 07/29/2019   SOB (shortness of breath) 07/27/2019   Seasonal and perennial allergic rhinoconjunctivitis 07/20/2019  Asthma 07/20/2019   Heartburn 07/20/2019   Stress and adjustment reaction 07/14/2019   Healthcare maintenance 07/14/2019   Hypertension 07/14/2019    Allergies: No Known Allergies Medications:  Current Outpatient Medications:    albuterol (PROVENTIL HFA) 108 (90 Base) MCG/ACT inhaler, Inhale 1-2 puffs into the lungs every 6 (six) hours as needed for wheezing or shortness of breath., Disp: 18 g, Rfl: 1   amLODipine (NORVASC) 10 MG tablet, Take 1 tablet (10 mg total) by mouth daily., Disp: 90 tablet, Rfl: 3   azelastine  (ASTELIN) 0.1 % nasal spray, 2 sprays per nostril 2 times daily as needed for drainage., Disp: 30 mL, Rfl: 5   beclomethasone (QVAR REDIHALER) 80 MCG/ACT inhaler, Inhale 2 puffs into the lungs 2 (two) times daily. Rinse mouth out after each use., Disp: 10.6 g, Rfl: 5   Budeson-Glycopyrrol-Formoterol (BREZTRI AEROSPHERE) 160-9-4.8 MCG/ACT AERO, Inhale 2 puffs into the lungs in the morning and at bedtime., Disp: 10.7 g, Rfl: 5   EPINEPHrine 0.3 mg/0.3 mL IJ SOAJ injection, INJECT 0.3 MLS INTO MUSCLE, Disp: 1 each, Rfl: 2   escitalopram (LEXAPRO) 20 MG tablet, Take 1 tablet (20 mg total) by mouth daily., Disp: 90 tablet, Rfl: 3   fenofibrate (TRICOR) 145 MG tablet, TAKE 1 TABLET(145 MG) BY MOUTH DAILY, Disp: 90 tablet, Rfl: 1   ipratropium (ATROVENT) 0.06 % nasal spray, Place 2 sprays in each nostril up to three times a day as needed, Disp: 15 mL, Rfl: 5   losartan (COZAAR) 25 MG tablet, Take 1 tablet (25 mg total) by mouth daily., Disp: 30 tablet, Rfl: 5   montelukast (SINGULAIR) 10 MG tablet, TAKE 1 TABLET BY MOUTH EVERYDAY AT BEDTIME, Disp: 30 tablet, Rfl: 5   NUCALA 100 MG SOLR, Inject into the skin., Disp: , Rfl:    pantoprazole (PROTONIX) 20 MG tablet, Take 1 tablet (20 mg total) by mouth daily., Disp: 30 tablet, Rfl: 3   predniSONE (DELTASONE) 20 MG tablet, Take 2 tablets (40 mg total) by mouth daily with breakfast., Disp: 14 tablet, Rfl: 0   sucralfate (CARAFATE) 1 g tablet, Take 1 tablet (1 g total) by mouth 4 (four) times daily -  with meals and at bedtime., Disp: 120 tablet, Rfl: 3  Current Facility-Administered Medications:    Mepolizumab SOLR 100 mg, 100 mg, Subcutaneous, Q28 days, Kennith Gain, MD, 100 mg at 05/15/21 1438  Observations/Objective: Patient is well-developed, well-nourished in no acute distress.  Resting comfortably at home.  Head is normocephalic, atraumatic.  No labored breathing.  Speech is clear and coherent with logical content.  Patient is alert and  oriented at baseline.  Unable to see entire leg in video. No edema noted in calf area.  Assessment and Plan:  Agamjot Mazzara in today with chief complaint of Leg Pain   1. Left leg pain Moist het or ice which ever make sit feel better Elevate when sitting If no bette rby Monday or swelling increases, will need to be seen in person. Meds ordered this encounter  Medications   naproxen (NAPROSYN) 500 MG tablet    Sig: Take 1 tablet (500 mg total) by mouth 2 (two) times daily with a meal.    Dispense:  60 tablet    Refill:  1    Order Specific Question:   Supervising Provider    Answer:   Noemi Chapel [[id number]]      Follow Up Instructions: I discussed the assessment and treatment plan with the patient. The patient was provided  an opportunity to ask questions and all were answered. The patient agreed with the plan and demonstrated an understanding of the instructions.  A copy of instructions were sent to the patient via MyChart.  The patient was advised to call back or seek an in-person evaluation if the symptoms worsen or if the condition fails to improve as anticipated.  Time:  I spent 8 minutes with the patient via telehealth technology discussing the above problems/concerns.    [name], FNP

## 2021-06-26 ENCOUNTER — Telehealth: Payer: 59 | Admitting: Physician Assistant

## 2021-06-26 DIAGNOSIS — J4541 Moderate persistent asthma with (acute) exacerbation: Secondary | ICD-10-CM

## 2021-06-26 DIAGNOSIS — J4551 Severe persistent asthma with (acute) exacerbation: Secondary | ICD-10-CM

## 2021-06-26 MED ORDER — PREDNISONE 20 MG PO TABS: 40.0000 mg | ORAL_TABLET | Freq: Every day | ORAL | 0 refills | Status: DC

## 2021-06-26 NOTE — Progress Notes (Signed)
Visit for Asthma  Based on what you have shared with me, it looks like you may have a flare up of your asthma.  Asthma is a chronic (ongoing) lung disease which results in airway obstruction, inflammation and hyper-responsiveness.   Asthma symptoms vary from person to person, with common symptoms including nighttime awakening and decreased ability to participate in normal activities as a result of shortness of breath. It is often triggered by changes in weather, changes in the season, changes in air temperature, or inside (home, school, daycare or work) allergens such as animal dander, mold, mildew, woodstoves or cockroaches.   It can also be triggered by hormonal changes, extreme emotion, physical exertion or an upper respiratory tract illness.     It is important to identify the trigger, and then eliminate or avoid the trigger if possible.   If you have been prescribed medications to be taken on a regular basis, it is important to follow the asthma action plan and to follow guidelines to adjust medication in response to increasing symptoms of decreased peak expiratory flow rate  Treatment: I have prescribed: Prednisone 40mg by mouth per day for 5 - 7 days  HOME CARE Only take medications as instructed by your medical team. Consider wearing a mask or scarf to improve breathing air temperature have been shown to decrease irritation and decrease exacerbations Get rest. Taking a steamy shower or using a humidifier may help nasal congestion sand ease sore throat pain. You can place a towel over your head and breathe in the steam from hot water coming from a faucet. Using a saline nasal spray works much the same way.  Cough drops, hare candies and sore throat lozenges may ease your cough.  Avoid close contacts especially the very you and the elderly Cover your mouth if you cough or  sneeze Always remember to wash your hands.    GET HELP RIGHT AWAY IF: You develop worsening symptoms; breathlessness at rest, drowsy, confused or agitated, unable to speak in full sentences You have coughing fits You develop a severe headache or visual changes You develop shortness of breath, difficulty breathing or start having chest pain Your symptoms persist after you have completed your treatment plan If your symptoms do not improve within 10 days  MAKE SURE YOU Understand these instructions. Will watch your condition. Will get help right away if you are not doing well or get worse.   Your e-visit answers were reviewed by a board certified advanced clinical practitioner to complete your personal care plan, Depending upon the condition, your plan could have included both over the counter or prescription medications.   Please review your pharmacy choice. Your safety is important to us. If you have drug allergies check your prescription carefully.  You can use MyChart to ask questions about today's visit, request a non-urgent  call back, or ask for a work or school excuse for 24 hours related to this e-Visit. If it has been greater than 24 hours you will need to follow up with your provider, or enter a new e-Visit to address those concerns.   You will get an e-mail in the next two days asking about your experience. I hope that your e-visit has been valuable and will speed your recovery. Thank you for using e-visits.  I provided 5 minutes of non face-to-face time during this encounter for chart review and documentation.   

## 2021-07-02 ENCOUNTER — Telehealth: Payer: 59 | Admitting: Family

## 2021-07-02 DIAGNOSIS — J019 Acute sinusitis, unspecified: Secondary | ICD-10-CM | POA: Diagnosis not present

## 2021-07-02 MED ORDER — AMOXICILLIN-POT CLAVULANATE 875-125 MG PO TABS: 1.0000 | ORAL_TABLET | Freq: Two times a day (BID) | ORAL | 0 refills | Status: DC

## 2021-07-02 NOTE — Progress Notes (Signed)

## 2021-07-03 ENCOUNTER — Ambulatory Visit: Payer: 59

## 2021-07-04 ENCOUNTER — Other Ambulatory Visit: Payer: Self-pay

## 2021-07-04 ENCOUNTER — Ambulatory Visit (INDEPENDENT_AMBULATORY_CARE_PROVIDER_SITE_OTHER): Payer: 59 | Admitting: *Deleted

## 2021-07-04 DIAGNOSIS — J4550 Severe persistent asthma, uncomplicated: Secondary | ICD-10-CM

## 2021-07-11 ENCOUNTER — Telehealth: Payer: Self-pay

## 2021-07-11 NOTE — Telephone Encounter (Signed)
Called and left a voicemail asking for patient to return call to discuss.  °

## 2021-07-11 NOTE — Telephone Encounter (Signed)
Patient called in stating that he has nasal drainage that is causing him to have an upset stomach, nausea, and has had an asthma flare up. Patient states he is taking medication as prescribed. This has been going on off and on for about a week. Please advise.

## 2021-07-12 ENCOUNTER — Other Ambulatory Visit: Payer: Self-pay | Admitting: *Deleted

## 2021-07-12 MED ORDER — ALBUTEROL SULFATE (2.5 MG/3ML) 0.083% IN NEBU: 2.5000 mg | INHALATION_SOLUTION | RESPIRATORY_TRACT | 1 refills | Status: DC | PRN

## 2021-07-12 NOTE — Telephone Encounter (Signed)
Ok to send in a prescription for albuterol 0.083% using 1 unit dose every 4-6 hours as needed for cough, wheeze, tightness in chest, or shortness of breath. 1 refill

## 2021-07-12 NOTE — Telephone Encounter (Signed)
Prescription has been sent in. Called patient and advised, patient verbalized understanding.  ?

## 2021-07-18 ENCOUNTER — Ambulatory Visit: Payer: 59 | Admitting: Internal Medicine

## 2021-08-01 ENCOUNTER — Other Ambulatory Visit: Payer: Self-pay

## 2021-08-01 ENCOUNTER — Ambulatory Visit (INDEPENDENT_AMBULATORY_CARE_PROVIDER_SITE_OTHER): Payer: 59 | Admitting: *Deleted

## 2021-08-01 DIAGNOSIS — J4550 Severe persistent asthma, uncomplicated: Secondary | ICD-10-CM | POA: Diagnosis not present

## 2021-08-03 ENCOUNTER — Other Ambulatory Visit: Payer: Self-pay | Admitting: Nurse Practitioner

## 2021-08-03 DIAGNOSIS — E781 Pure hyperglyceridemia: Secondary | ICD-10-CM

## 2021-08-07 ENCOUNTER — Telehealth: Payer: 59 | Admitting: Physician Assistant

## 2021-08-07 DIAGNOSIS — B9689 Other specified bacterial agents as the cause of diseases classified elsewhere: Secondary | ICD-10-CM

## 2021-08-07 DIAGNOSIS — J019 Acute sinusitis, unspecified: Secondary | ICD-10-CM

## 2021-08-07 MED ORDER — AMOXICILLIN-POT CLAVULANATE 875-125 MG PO TABS: 1.0000 | ORAL_TABLET | Freq: Two times a day (BID) | ORAL | 0 refills | Status: DC

## 2021-08-07 NOTE — Progress Notes (Signed)

## 2021-08-24 ENCOUNTER — Ambulatory Visit: Payer: 59 | Admitting: Allergy

## 2021-08-29 ENCOUNTER — Ambulatory Visit (INDEPENDENT_AMBULATORY_CARE_PROVIDER_SITE_OTHER): Payer: 59

## 2021-08-29 DIAGNOSIS — J4550 Severe persistent asthma, uncomplicated: Secondary | ICD-10-CM

## 2021-09-04 ENCOUNTER — Ambulatory Visit (INDEPENDENT_AMBULATORY_CARE_PROVIDER_SITE_OTHER): Payer: 59 | Admitting: Nurse Practitioner

## 2021-09-04 ENCOUNTER — Encounter: Payer: Self-pay | Admitting: Nurse Practitioner

## 2021-09-04 VITALS — BP 120/76 | HR 80 | Temp 97.8°F | Ht 68.75 in | Wt 197.6 lb

## 2021-09-04 DIAGNOSIS — E7800 Pure hypercholesterolemia, unspecified: Secondary | ICD-10-CM

## 2021-09-04 DIAGNOSIS — R7989 Other specified abnormal findings of blood chemistry: Secondary | ICD-10-CM | POA: Diagnosis not present

## 2021-09-04 DIAGNOSIS — F331 Major depressive disorder, recurrent, moderate: Secondary | ICD-10-CM | POA: Diagnosis not present

## 2021-09-04 DIAGNOSIS — E781 Pure hyperglyceridemia: Secondary | ICD-10-CM | POA: Diagnosis not present

## 2021-09-04 DIAGNOSIS — I10 Essential (primary) hypertension: Secondary | ICD-10-CM | POA: Diagnosis not present

## 2021-09-04 MED ORDER — AMLODIPINE BESYLATE 10 MG PO TABS: 10.0000 mg | ORAL_TABLET | Freq: Every day | ORAL | 3 refills | Status: DC

## 2021-09-04 MED ORDER — BUPROPION HCL 75 MG PO TABS: 75.0000 mg | ORAL_TABLET | Freq: Two times a day (BID) | ORAL | 5 refills | Status: DC

## 2021-09-04 MED ORDER — LOSARTAN POTASSIUM 25 MG PO TABS: 25.0000 mg | ORAL_TABLET | Freq: Every day | ORAL | 3 refills | Status: DC

## 2021-09-04 NOTE — Progress Notes (Signed)
? ?             Established Patient Visit ? ?Patient: Jordan [name]   DOB: 1975-11-11   46 y.o. Male  MRN: [id number] ?Visit Date: 09/04/2021 ? ?Subjective:  ?  ?Chief Complaint  ?Patient presents with  ? Follow-up  ?  Pt would like to discuss concerns regarding his anxiety and cholesterol. ?Pt would like his testosterone checked also ?Pt is fasting.  ? ?HPI ?Depression, recurrent ([place]) ?Reports minimal improvement with lexapro. No SI/HI or hallucinations. ?Also states he was [facility]. He is unable to remember name of medication prescribed at the time. ?I requested a signed medical release form to get records. ? ?Maintain lexapro dose ?Add wellbutrin 75mg  BID. ?He agreed to psychology referral ?F/up in 57month ? ?Hypertension ?BP at goal with amlodipine and losartan. ?BP Readings from Last 3 Encounters:  ?09/04/21 120/76  ?02/02/21 (!) 137/96  ?11/02/20 140/90  ? ?Maintain med doses ?Refills sent ? ?Elevated LDL cholesterol level ?Current use of fenofibrate without adverse side effects. ?Repeat lipid panel ? ? ?  09/04/2021  ?  1:19 PM 03/09/2020  ?  1:40 PM 09/15/2019  ?  1:07 PM  ?Depression screen PHQ 2/9  ?Decreased Interest 0 1 0  ?Down, Depressed, Hopeless 1 1 0  ?PHQ - 2 Score 1 2 0  ?Altered sleeping 1 1   ?Tired, decreased energy 1 1   ?Change in appetite 0 1   ?Feeling bad or failure about yourself  1 1   ?Trouble concentrating 0 1   ?Moving slowly or fidgety/restless 0 1   ?Suicidal thoughts 0 0   ?PHQ-9 Score 4 8   ?Difficult doing work/chores Not difficult at all Somewhat difficult   ?  ? ?  09/04/2021  ?  1:20 PM 03/09/2020  ?  1:40 PM 07/14/2019  ?  3:25 PM  ?GAD 7 : Generalized Anxiety Score  ?Nervous, Anxious, on Edge 1 1 1   ?Control/stop worrying 1 1 2   ?Worry too much - different things 1 1 2   ?Trouble relaxing 1 1 2   ?Restless 1 1 2   ?Easily annoyed or irritable 1 1 2   ?Afraid - awful might happen 1 1 1   ?Total GAD 7 Score 7 7 12   ?Anxiety  Difficulty Not difficult at all Somewhat difficult Not difficult at all  ? ?Reviewed medical, surgical, and social history today ? ?Medications: ?Outpatient Medications Prior to Visit  ?Medication Sig  ? albuterol (PROVENTIL HFA) 108 (90 Base) MCG/ACT inhaler Inhale 1-2 puffs into the lungs every 6 (six) hours as needed for wheezing or shortness of breath.  ? albuterol (PROVENTIL) (2.5 MG/3ML) 0.083% nebulizer solution Take 3 mLs (2.5 mg total) by nebulization every 4 (four) hours as needed for wheezing or shortness of breath.  ? azelastine (ASTELIN) 0.1 % nasal spray 2 sprays per nostril 2 times daily as needed for drainage.  ? beclomethasone (QVAR REDIHALER) 80 MCG/ACT inhaler Inhale 2 puffs into the lungs 2 (two) times daily. Rinse mouth out after each use.  ? Budeson-Glycopyrrol-Formoterol (BREZTRI AEROSPHERE) 160-9-4.8 MCG/ACT AERO Inhale 2 puffs into the lungs in the morning and at bedtime.  ? EPINEPHrine 0.3 mg/0.3 mL IJ SOAJ injection INJECT 0.3 MLS INTO MUSCLE  ? escitalopram (LEXAPRO) 20 MG tablet Take 1 tablet (20 mg total) by mouth daily.  ? fenofibrate (TRICOR) 145 MG tablet TAKE 1 TABLET(145 MG) BY MOUTH DAILY  ? ipratropium (ATROVENT) 0.06 % nasal  spray Place 2 sprays in each nostril up to three times a day as needed  ? montelukast (SINGULAIR) 10 MG tablet TAKE 1 TABLET BY MOUTH EVERYDAY AT BEDTIME  ? naproxen (NAPROSYN) 500 MG tablet Take 1 tablet (500 mg total) by mouth 2 (two) times daily with a meal.  ? NUCALA 100 MG SOLR Inject into the skin.  ? pantoprazole (PROTONIX) 20 MG tablet Take 1 tablet (20 mg total) by mouth daily.  ? [DISCONTINUED] amLODipine (NORVASC) 10 MG tablet Take 1 tablet (10 mg total) by mouth daily.  ? [DISCONTINUED] losartan (COZAAR) 25 MG tablet Take 1 tablet (25 mg total) by mouth daily.  ? sucralfate (CARAFATE) 1 g tablet Take 1 tablet (1 g total) by mouth 4 (four) times daily -  with meals and at bedtime.  ? [DISCONTINUED] amoxicillin-clavulanate (AUGMENTIN) 875-125 MG  tablet Take 1 tablet by mouth 2 (two) times daily. (Patient not taking: Reported on 09/04/2021)  ? ?Facility-Administered Medications Prior to Visit  ?Medication Dose Route Frequency Provider  ? Mepolizumab SOLR 100 mg  100 mg Subcutaneous Q28 days Kennith Gain, MD  ? ?Reviewed past medical and social history.  ? ?ROS per HPI above ? ? ?   ?Objective:  ?BP 120/76 (BP Location: Left Arm, Patient Position: Sitting, Cuff Size: Large)   Pulse 80   Temp 97.8 ?F (36.6 ?C) (Temporal)   Ht 5' 8.75" (1.746 m)   Wt 197 lb 9.6 oz (89.6 kg)   SpO2 97%   BMI 29.39 kg/m?  ? ?  ? ?Physical Exam ?Constitutional:   ?   General: He is not in acute distress. ?Cardiovascular:  ?   Rate and Rhythm: Normal rate.  ?   Pulses: Normal pulses.  ?Pulmonary:  ?   Effort: Pulmonary effort is normal.  ?Musculoskeletal:  ?   Right lower leg: No edema.  ?   Left lower leg: No edema.  ?Neurological:  ?   Mental Status: He is alert and oriented to person, place, and time.  ?Psychiatric:     ?   Mood and Affect: Mood normal.     ?   Behavior: Behavior normal.     ?   Thought Content: Thought content normal.  ?  ?No results found for any visits on 09/04/21. ?   ?Assessment & Plan:  ?  ?Problem List Items Addressed This Visit   ? ?  ? Cardiovascular and Mediastinum  ? Hypertension  ?  BP at goal with amlodipine and losartan. ?BP Readings from Last 3 Encounters:  ?09/04/21 120/76  ?02/02/21 (!) 137/96  ?11/02/20 140/90  ? ?Maintain med doses ?Refills sent ?  ?  ? Relevant Medications  ? losartan (COZAAR) 25 MG tablet  ? amLODipine (NORVASC) 10 MG tablet  ?  ? Other  ? Depression, recurrent ([place]) - Primary  ?  Reports minimal improvement with lexapro. No SI/HI or hallucinations. ?Also states he was [facility]. He is unable to remember name of medication prescribed at the time. ?I requested a signed medical release form to get records. ? ?Maintain lexapro dose ?Add wellbutrin 75mg   BID. ?He agreed to psychology referral ?F/up in 75month ?  ?  ? Relevant Medications  ? buPROPion (WELLBUTRIN) 75 MG tablet  ? Elevated LDL cholesterol level  ?  Current use of fenofibrate without adverse side effects. ?Repeat lipid panel ?  ?  ? Low testosterone in male  ? Relevant Orders  ?  Testosterone Free with [facility]  ? ?Other Visit Diagnoses   ? ? Hypertriglyceridemia      ? Relevant Medications  ? losartan (COZAAR) 25 MG tablet  ? amLODipine (NORVASC) 10 MG tablet  ? Other Relevant Orders  ? Lipid panel  ? Hepatic function panel  ? ?  ? ?Return in about 4 weeks (around 10/02/2021) for depression and anxiety. ? ?  ? ?Wilfred Lacy, NP ? ? ?

## 2021-09-04 NOTE — Assessment & Plan Note (Signed)
Current use of fenofibrate without adverse side effects. ?Repeat lipid panel ?

## 2021-09-04 NOTE — Patient Instructions (Signed)
Schedule AM lab appt.labs need to be completed before 10am. Need to be fasting 8hrs prior to blood draw. ? ?Sign medical release to get records from [facility]. ? ? ?

## 2021-09-04 NOTE — Assessment & Plan Note (Signed)
BP at goal with amlodipine and losartan. ?BP Readings from Last 3 Encounters:  ?09/04/21 120/76  ?02/02/21 (!) 137/96  ?11/02/20 140/90  ? ?Maintain med doses ?Refills sent ?

## 2021-09-04 NOTE — Assessment & Plan Note (Addendum)
Reports minimal improvement with lexapro. No SI/HI or hallucinations. ?Also states he was diagnosed with [facility]. He is unable to remember name of medication prescribed at the time. ?I requested a signed medical release form to get records. ? ?Maintain lexapro dose ?Add wellbutrin 75mg  BID. ?He agreed to psychology referral ?F/up in 72month ?

## 2021-09-05 ENCOUNTER — Telehealth: Payer: Self-pay | Admitting: Nurse Practitioner

## 2021-09-05 DIAGNOSIS — R110 Nausea: Secondary | ICD-10-CM

## 2021-09-05 NOTE — Telephone Encounter (Signed)
Pt called concerned about his nausea with the wellbutrin he just started. He wonders if something can be prescribed for nausea. Said he would like to speak with a nurse.  ?

## 2021-09-08 MED ORDER — ONDANSETRON HCL 4 MG PO TABS: 4.0000 mg | ORAL_TABLET | Freq: Three times a day (TID) | ORAL | 0 refills | Status: DC | PRN

## 2021-09-08 NOTE — Telephone Encounter (Signed)
Patient notified and verbalized understanding. 

## 2021-09-13 ENCOUNTER — Telehealth: Payer: 59 | Admitting: Physician Assistant

## 2021-09-13 DIAGNOSIS — K047 Periapical abscess without sinus: Secondary | ICD-10-CM | POA: Diagnosis not present

## 2021-09-13 MED ORDER — AMOXICILLIN 500 MG PO CAPS: 500.0000 mg | ORAL_CAPSULE | Freq: Three times a day (TID) | ORAL | 0 refills | Status: AC

## 2021-09-13 NOTE — Progress Notes (Signed)
E-Visit for Dental Pain ? ?We are sorry that you are not feeling well.  Here is how we plan to help! ? ?Based on what you have shared with me in the questionnaire, it sounds like you have a dental infection ? ?I recommend you take OTC naprosyn 500mg  2 times per day for 7 days for discomfort and I have prescribed Amoxicillin 500mg  3 times per day for 10 days ? ?It is imperative that you see a dentist within 10 days of this eVisit to determine the cause of the dental pain and be sure it is adequately treated ? ?A toothache or tooth pain is caused when the nerve in the root of a tooth or surrounding a tooth is irritated. Dental (tooth) infection, decay, injury, or loss of a tooth are the most common causes of dental pain. Pain may also occur after an extraction (tooth is pulled out). Pain sometimes originates from other areas and radiates to the jaw, thus appearing to be tooth pain.Bacteria growing inside your mouth can contribute to gum disease and dental decay, both of which can cause pain. A toothache occurs from inflammation of the central portion of the tooth called pulp. The pulp contains nerve endings that are very sensitive to pain. Inflammation to the pulp or pulpitis may be caused by dental cavities, trauma, and infection.  ? ? ?HOME CARE:  ? ?For toothaches: ?Over-the-counter pain medications such as acetaminophen or ibuprofen may be used. Take these as directed on the package while you arrange for a dental appointment. ?Avoid very cold or hot foods, because they may make the pain worse. ?You may get relief from biting on a cotton ball soaked in oil of cloves. You can get oil of cloves at most drug stores. ? ?For jaw pain: ? Aspirin may be helpful for problems in the joint of the jaw in adults. ?If pain happens every time you open your mouth widely, the temporomandibular joint (TMJ) may be the source of the pain. Yawning or taking a large bite of food may worsen the pain. An appointment with your doctor or  dentist will help you find the cause. ?  ? ? ?GET HELP RIGHT AWAY IF: ? ?You have a high fever or chills ?If you have had a recent head or face injury and develop headache, light headedness, nausea, vomiting, or other symptoms that concern you after an injury to your face or mouth, you could have a more serious injury in addition to your dental injury. ?A facial rash associated with a toothache: This condition may improve with medication. Contact your doctor for them to decide what is appropriate. ?Any jaw pain occurring with chest pain: Although jaw pain is most commonly caused by dental disease, it is sometimes referred pain from other areas. People with heart disease, especially people who have had stents placed, people with diabetes, or those who have had heart surgery may have jaw pain as a symptom of heart attack or angina. If your jaw or tooth pain is associated with lightheadedness, sweating, or shortness of breath, you should see a doctor as soon as possible. ?Trouble swallowing or excessive pain or bleeding from gums: If you have a history of a weakened immune system, diabetes, or steroid use, you may be more susceptible to infections. Infections can often be more severe and extensive or caused by unusual organisms. Dental and gum infections in people with these conditions may require more aggressive treatment. An abscess may need draining or IV antibiotics, for example. ? ?  MAKE SURE YOU  ? ?Understand these instructions. ?Will watch your condition. ?Will get help right away if you are not doing well or get worse. ? ?Thank you for choosing an e-visit. ? ?Your e-visit answers were reviewed by a board certified advanced clinical practitioner to complete your personal care plan. Depending upon the condition, your plan could have included both over the counter or prescription medications. ? ?Please review your pharmacy choice. Make sure the pharmacy is open so you can pick up prescription now. If there is a  problem, you may contact your provider through [employer] and have the prescription routed to another pharmacy.  Your safety is important to [place]. If you have drug allergies check your prescription carefully.  ? ?For the next 24 hours you can use MyChart to ask questions about today's visit, request a non-urgent call back, or ask for a work or school excuse. ?You will get an email in the next two days asking about your experience. I hope that your e-visit has been valuable and will speed your recovery. ? ?

## 2021-09-13 NOTE — Progress Notes (Signed)
I have spent 5 minutes in review of e-visit questionnaire, review and updating patient chart, medical decision making and response to patient.   Finola Rosal Cody Fredrick Ge[place], PA-C    

## 2021-09-14 ENCOUNTER — Other Ambulatory Visit: Payer: Self-pay | Admitting: Nurse Practitioner

## 2021-09-14 DIAGNOSIS — I10 Essential (primary) hypertension: Secondary | ICD-10-CM

## 2021-09-18 ENCOUNTER — Ambulatory Visit: Payer: 59 | Admitting: Internal Medicine

## 2021-09-26 ENCOUNTER — Telehealth: Payer: 59 | Admitting: Physician Assistant

## 2021-09-26 ENCOUNTER — Ambulatory Visit (INDEPENDENT_AMBULATORY_CARE_PROVIDER_SITE_OTHER): Payer: 59

## 2021-09-26 DIAGNOSIS — K047 Periapical abscess without sinus: Secondary | ICD-10-CM

## 2021-09-26 DIAGNOSIS — J4550 Severe persistent asthma, uncomplicated: Secondary | ICD-10-CM

## 2021-09-26 NOTE — Progress Notes (Signed)
Based on what you shared with me, I feel your condition warrants further evaluation and I recommend that you be seen in a face to face visit. Giving dental infection requiring evaluation and antibiotics already in the past 2 weeks, you need to be evaluated in person. If you cannot see a dental provider then you nee to be evaluated by your PCP or at a local urgent care.  ?  ?NOTE: There will be NO CHARGE for this eVisit ?  ?If you are having a true medical emergency please call 911.   ?  ? For an urgent face to face visit, [place] has six urgent care centers for your convenience:  ?  ? [facility] ?Get Driving Directions ?[phone] ?[phone] ?[place], [place] [address] ?  ? [facility]) ?Get Driving Directions ?[phone] ?[address] ?[place], [place] [address] ? ?[facility]) ?Get Driving Directions ?[phone] ?[address] ?[place],  [place]  [address] ? ?[facility] ?Get Driving Directions ?[phone] ?[address] ?[place], [place] [address] ?  ?[facility] [place] ?Get Driving Directions  ?[phone] ?[address] ?[address] ?[place], [place] [address] ?  ?[facility] ?Get Driving Directions ?[phone] ?[address] ?[place], [place] [address] ? ?Your MyChart E-visit questionnaire answers were reviewed by a board certified advanced clinical practitioner to complete your personal care plan based on your specific symptoms.  Thank you for using e-Visits. ?  ? ?

## 2021-10-02 ENCOUNTER — Ambulatory Visit: Payer: 59 | Admitting: Nurse Practitioner

## 2021-10-02 ENCOUNTER — Telehealth: Payer: 59 | Admitting: Emergency Medicine

## 2021-10-02 ENCOUNTER — Telehealth: Payer: Self-pay | Admitting: Nurse Practitioner

## 2021-10-02 DIAGNOSIS — B86 Scabies: Secondary | ICD-10-CM

## 2021-10-02 MED ORDER — PERMETHRIN 5 % EX CREA: TOPICAL_CREAM | Freq: Once | CUTANEOUS | Status: DC

## 2021-10-02 NOTE — Telephone Encounter (Signed)
Patient/Caregiver was notified of No Show/Late Cancellation Policy & possible $50 charge. ?Visit was cancelled with reason "No Show/Cancel within 24 hours" for tracking & charging. ? ?Caller Name: Eoin Willden ?Caller Ph #: [phone] ?Date of APPT: 10/02/21 ?Reason given for no show/late cancellation:  Pt called saying he tried to cancel last night on mychart... rescheduled for June. ?No Show Letter printed & put in outgoing mail (Yes/No): no ? ?~~~Route message to admin supervisor and clinical team/CMA~~~ ? ? ?

## 2021-10-02 NOTE — Progress Notes (Signed)
E-Visit for Scabies ? ?We are sorry that you are not feeling well. Here is how we plan to help! ? ?Based on what you shared with me it looks like you have scabies.  Scabies is an infection caused by very tiny mites that burrow into the skin.  They cause severe itching. Though children are most commonly infected, anyone can get scabies.  Scabies mites can pass from person to person through physical close contact.  They can also be passed through shared clothing, towels, and bedding.  Scabies infection is not usually dangerous, but it is uncomfortable.  Because it is so contagious, scabies should be treated immediately to keep the infection from spreading.  If you have children in day care, please have any exposed children examined by their pediatrician. ?.  ? ?I have prescribed Permethrin topical cream 5% thoroughly massage cream from head to soles of feet; leave on for 8 to 14 hours before removing (shower or bath). May repeat if living mites are observed 14 days after first treatment; one application is generally curative.  ? ? ?HOME CARE: ? ?You should treat all members in your household whether they show symptoms or not. ?Wash towels, clothes, bed linens, cloth toys, hats, and other personal items in hot water and dry on high heat. ?Vacuum floors and furniture and throw away the bag afterwards. ?Keep your child home from day care or school until the morning after treatment for scabies. ?Notify your child?s day care or school so that other children can be checked and treated. ? ?GET HELP RIGHT AWAY IF: ? ?The infected person has a fever, red streaks, pain, or swelling of the skin. ?Sores get worse or don?t heal. ?New rashes appear or itching continues for more than 2 weeks after treatment. ? ?MAKE SURE YOU: ? ?Understand these instructions. ?Will watch your condition. ?Will get help right away if you are not doing well or get worse. ? ?Thank you for choosing an e-visit. ? ?Your e-visit answers were reviewed by a  board certified advanced clinical practitioner to complete your personal care plan. Depending upon the condition, your plan could have included both over the counter or prescription medications. ? ?Please review your pharmacy choice. Make sure the pharmacy is open so you can pick up prescription now. If there is a problem, you may contact your provider through [employer] and have the prescription routed to another pharmacy.  Your safety is important to [place]. If you have drug allergies check your prescription carefully.  ? ?For the next 24 hours you can use MyChart to ask questions about today's visit, request a non-urgent call back, or ask for a work or school excuse. ?You will get an email in the next two days asking about your experience. I hope that your e-visit has been valuable and will speed your recovery. ? ?I have spent 5 minutes in review of e-visit questionnaire, review and updating patient chart, medical decision making and response to patient.  ? ?Rica Mast, PhD, FNP-BC ?  ? ?

## 2021-10-11 ENCOUNTER — Ambulatory Visit: Payer: 59 | Admitting: Nurse Practitioner

## 2021-10-11 ENCOUNTER — Telehealth: Payer: Self-pay | Admitting: Nurse Practitioner

## 2021-10-11 NOTE — Telephone Encounter (Signed)
Pt is wanting a call back concerning his buPROPion (WELLBUTRIN) 75 MG tablet [id number]  script. He is having trouble  going to sleep at night. He is wanting to take his pills 6hrs apart and not 12. Please advise [phone] ?

## 2021-10-12 NOTE — Telephone Encounter (Signed)
Patient states he wants to know if he can take his medication 6 hours apart?  He has only been taking 1 pill instead of 2 due to it being to late to take that second dose. Please advise.

## 2021-10-13 ENCOUNTER — Other Ambulatory Visit: Payer: Self-pay

## 2021-10-13 MED ORDER — BUPROPION HCL ER (XL) 150 MG PO TB24: 150.0000 mg | ORAL_TABLET | Freq: Every day | ORAL | 1 refills | Status: DC

## 2021-10-13 NOTE — Telephone Encounter (Signed)
New Rx sent.

## 2021-10-17 ENCOUNTER — Telehealth: Payer: 59 | Admitting: Nurse Practitioner

## 2021-10-17 DIAGNOSIS — T148XXA Other injury of unspecified body region, initial encounter: Secondary | ICD-10-CM

## 2021-10-17 MED ORDER — CYCLOBENZAPRINE HCL 10 MG PO TABS: 10.0000 mg | ORAL_TABLET | Freq: Three times a day (TID) | ORAL | 0 refills | Status: DC | PRN

## 2021-10-17 NOTE — Progress Notes (Signed)
Virtual Visit Consent   Jordan [name], you are scheduled for a virtual visit with a [facility] provider today. Just as with appointments in the office, your consent must be obtained to participate. Your consent will be active for this visit and any virtual visit you may have with one of our providers in the next 365 days. If you have a MyChart account, a copy of this consent can be sent to you electronically.  As this is a virtual visit, video technology does not allow for your provider to perform a traditional examination. This may limit your provider's ability to fully assess your condition. If your provider identifies any concerns that need to be evaluated in person or the need to arrange testing (such as labs, EKG, etc.), we will make arrangements to do so. Although advances in technology are sophisticated, we cannot ensure that it will always work on either your end or our end. If the connection with a video visit is poor, the visit may have to be switched to a telephone visit. With either a video or telephone visit, we are not always able to ensure that we have a secure connection.  By engaging in this virtual visit, you consent to the provision of healthcare and authorize for your insurance to be billed (if applicable) for the services provided during this visit. Depending on your insurance coverage, you may receive a charge related to this service.  I need to obtain your verbal consent now. Are you willing to proceed with your visit today? Jordan [name] has provided verbal consent on 10/17/2021 for a virtual visit (video or telephone). Jordan Schneiders, FNP  Date: 10/17/2021 2:01 PM  Virtual Visit via Video Note   I, Jordan [name], connected with  Jordan [name]  ([id number], 07/07/1975) on 10/17/21 at  2:00 PM EDT by a video-enabled telemedicine application and verified that I am speaking with the correct person using two identifiers.  Location: Patient: Virtual Visit Location Patient: Home Provider:  Virtual Visit Location Provider: Home Office   I discussed the limitations of evaluation and management by telemedicine and the availability of in person appointments. The patient expressed understanding and agreed to proceed.    History of Present Illness: Jordan [name] is a 46 y.o. who identifies as a male who was assigned male at birth, and is being seen today with complaints of pressure and pain in his posterior right leg. Yesterday he was unable to walk on his leg, today he can but he feels pressure & pain with movement.    He denies any injury Denies any redness or warmth   He denies back pain   The pain was present when he work up yesterday.  He does lift weights but he denies anything new or out of the ordinary   Pressure is present even when sitting down. Denies any numbness or tingling distal to pain/pressure.   He has taken Naproxen and that did not change the pain or pressure. He also tried topical muscle rub that did improve the pain.   The pain seems to radiate from the pocket of his right hip to the back of his right knee.  Denies a history of hip pain or injury.   Problems:  Patient Active Problem List   Diagnosis Date Noted   Low testosterone in male 09/04/2021   Erectile dysfunction 11/02/2020   Depression, recurrent ([place]) 04/01/2020   Elevated LDL cholesterol level 07/29/2019   SOB (shortness of breath) 07/27/2019   Seasonal and perennial allergic  rhinoconjunctivitis 07/20/2019   Asthma 07/20/2019   Heartburn 07/20/2019   Stress and adjustment reaction 07/14/2019   Hypertension 07/14/2019    Allergies: No Known Allergies Medications:  Current Outpatient Medications:    albuterol (PROVENTIL HFA) 108 (90 Base) MCG/ACT inhaler, Inhale 1-2 puffs into the lungs every 6 (six) hours as needed for wheezing or shortness of breath., Disp: 18 g, Rfl: 1   albuterol (PROVENTIL) (2.5 MG/3ML) 0.083% nebulizer solution, Take 3 mLs (2.5 mg total) by nebulization every 4 (four)  hours as needed for wheezing or shortness of breath., Disp: 75 mL, Rfl: 1   amLODipine (NORVASC) 10 MG tablet, Take 1 tablet (10 mg total) by mouth daily., Disp: 90 tablet, Rfl: 3   azelastine (ASTELIN) 0.1 % nasal spray, 2 sprays per nostril 2 times daily as needed for drainage., Disp: 30 mL, Rfl: 5   beclomethasone (QVAR REDIHALER) 80 MCG/ACT inhaler, Inhale 2 puffs into the lungs 2 (two) times daily. Rinse mouth out after each use., Disp: 10.6 g, Rfl: 5   Budeson-Glycopyrrol-Formoterol (BREZTRI AEROSPHERE) 160-9-4.8 MCG/ACT AERO, Inhale 2 puffs into the lungs in the morning and at bedtime., Disp: 10.7 g, Rfl: 5   buPROPion (WELLBUTRIN XL) 150 MG 24 hr tablet, Take 1 tablet (150 mg total) by mouth daily., Disp: 90 tablet, Rfl: 1   EPINEPHrine 0.3 mg/0.3 mL IJ SOAJ injection, INJECT 0.3 MLS INTO MUSCLE, Disp: 1 each, Rfl: 2   escitalopram (LEXAPRO) 20 MG tablet, Take 1 tablet (20 mg total) by mouth daily., Disp: 90 tablet, Rfl: 3   fenofibrate (TRICOR) 145 MG tablet, TAKE 1 TABLET(145 MG) BY MOUTH DAILY, Disp: 90 tablet, Rfl: 1   ipratropium (ATROVENT) 0.06 % nasal spray, Place 2 sprays in each nostril up to three times a day as needed, Disp: 15 mL, Rfl: 5   losartan (COZAAR) 25 MG tablet, Take 1 tablet (25 mg total) by mouth daily., Disp: 90 tablet, Rfl: 3   montelukast (SINGULAIR) 10 MG tablet, TAKE 1 TABLET BY MOUTH EVERYDAY AT BEDTIME, Disp: 30 tablet, Rfl: 5   naproxen (NAPROSYN) 500 MG tablet, Take 1 tablet (500 mg total) by mouth 2 (two) times daily with a meal., Disp: 60 tablet, Rfl: 1   NUCALA 100 MG SOLR, Inject into the skin., Disp: , Rfl:    ondansetron (ZOFRAN) 4 MG tablet, Take 1 tablet (4 mg total) by mouth every 8 (eight) hours as needed for nausea or vomiting., Disp: 21 tablet, Rfl: 0   pantoprazole (PROTONIX) 20 MG tablet, Take 1 tablet (20 mg total) by mouth daily., Disp: 30 tablet, Rfl: 3   sucralfate (CARAFATE) 1 g tablet, Take 1 tablet (1 g total) by mouth 4 (four) times daily  -  with meals and at bedtime., Disp: 120 tablet, Rfl: 3  Current Facility-Administered Medications:    Mepolizumab SOLR 100 mg, 100 mg, Subcutaneous, Q28 days, Kennith Gain, MD, 100 mg at 09/26/21 1635   permethrin (ELIMITE) 5 % cream, , Topical, Once, [place], Dionne Bucy, NP  Observations/Objective: Patient is well-developed, well-nourished in no acute distress.  Resting comfortably at home.  Head is normocephalic, atraumatic.  No labored breathing.  Speech is clear and coherent with logical content.  Patient is alert and oriented at baseline.    Assessment and Plan: 1. Muscle strain Continue Naproxen as ordered with food  Add: - cyclobenzaprine (FLEXERIL) 10 MG tablet; Take 1 tablet (10 mg total) by mouth 3 (three) times daily as needed for muscle spasms (may cause drowsiness do  not drive or operate machinary).  Dispense: 30 tablet; Refill: 0    May also use muscle rub and perform passive range of motion stretches.  If no improvement in 24 hours seek f/u with PCP or UC for imaging evaluation  Earlier with any new or worsening symptoms   Follow Up Instructions: I discussed the assessment and treatment plan with the patient. The patient was provided an opportunity to ask questions and all were answered. The patient agreed with the plan and demonstrated an understanding of the instructions.  A copy of instructions were sent to the patient via MyChart unless otherwise noted below.    The patient was advised to call back or seek an in-person evaluation if the symptoms worsen or if the condition fails to improve as anticipated.  Time:  I spent 15 minutes with the patient via telehealth technology discussing the above problems/concerns.    Jordan Schneiders, FNP

## 2021-10-18 ENCOUNTER — Ambulatory Visit: Payer: 59 | Admitting: Nurse Practitioner

## 2021-10-19 ENCOUNTER — Encounter: Payer: Self-pay | Admitting: Allergy

## 2021-10-19 ENCOUNTER — Ambulatory Visit (INDEPENDENT_AMBULATORY_CARE_PROVIDER_SITE_OTHER): Payer: 59 | Admitting: Allergy

## 2021-10-19 VITALS — BP 136/88 | HR 66 | Temp 97.9°F | Resp 19 | Ht 68.0 in | Wt 190.8 lb

## 2021-10-19 DIAGNOSIS — H1013 Acute atopic conjunctivitis, bilateral: Secondary | ICD-10-CM

## 2021-10-19 DIAGNOSIS — J4550 Severe persistent asthma, uncomplicated: Secondary | ICD-10-CM

## 2021-10-19 DIAGNOSIS — J302 Other seasonal allergic rhinitis: Secondary | ICD-10-CM | POA: Diagnosis not present

## 2021-10-19 DIAGNOSIS — J069 Acute upper respiratory infection, unspecified: Secondary | ICD-10-CM

## 2021-10-19 DIAGNOSIS — R040 Epistaxis: Secondary | ICD-10-CM

## 2021-10-19 DIAGNOSIS — R12 Heartburn: Secondary | ICD-10-CM

## 2021-10-19 DIAGNOSIS — H1010 Acute atopic conjunctivitis, unspecified eye: Secondary | ICD-10-CM

## 2021-10-19 MED ORDER — BREZTRI AEROSPHERE 160-9-4.8 MCG/ACT IN AERO
2.0000 | INHALATION_SPRAY | Freq: Two times a day (BID) | RESPIRATORY_TRACT | 5 refills | Status: DC
Start: 2021-10-19 — End: 2022-06-11

## 2021-10-19 MED ORDER — IPRATROPIUM BROMIDE 0.06 % NA SOLN
NASAL | 5 refills | Status: DC
Start: 2021-10-19 — End: 2022-08-03

## 2021-10-19 MED ORDER — ALBUTEROL SULFATE HFA 108 (90 BASE) MCG/ACT IN AERS: 1.0000 | INHALATION_SPRAY | Freq: Four times a day (QID) | RESPIRATORY_TRACT | 1 refills | Status: DC | PRN

## 2021-10-19 MED ORDER — AZELASTINE HCL 0.1 % NA SOLN
NASAL | 5 refills | Status: DC
Start: 2021-10-19 — End: 2022-06-11

## 2021-10-19 MED ORDER — QVAR REDIHALER 80 MCG/ACT IN AERB: 2.0000 | INHALATION_SPRAY | Freq: Two times a day (BID) | RESPIRATORY_TRACT | 5 refills | Status: DC

## 2021-10-19 MED ORDER — MONTELUKAST SODIUM 10 MG PO TABS
ORAL_TABLET | ORAL | 5 refills | Status: DC
Start: 2021-10-19 — End: 2021-11-05

## 2021-10-19 NOTE — Patient Instructions (Addendum)
Asthma:  Daily controller medication(s):   Breztri 2 puffs twice a day and Qvar 2 puffs twice a day with spacer and rinse mouth afterwards.    Advised if needing additional controller medication he can do more Qvar versus taking more Breztri Continue Singulair 10mg  daily at night.  Prior to physical activity: May use albuterol rescue inhaler 2 puffs 5 to 15 minutes prior to strenuous physical activities. Rescue medications: May use albuterol rescue inhaler 2 puffs or nebulizer every 4 to 6 hours as needed for shortness of breath, chest tightness, coughing, and wheezing. Monitor frequency of use. Continue Nucala injections monthly for now.  He has been noting waning efficacy of Nucala.  Tezspire injection was discussed today and will talk with our Biologics coordinator to see if this may be something we can change to with insurance coverage  Control goals:  Full participation in all desired activities (may need albuterol before activity) Albuterol use two times or less a week on average (not counting use with activity) Cough interfering with sleep two times or less a month Oral steroids no more than once a year  Seasonal and perennial allergic rhinoconjunctivitis Past history - 2021 skin testing was positive to grass, tree, mold, dust mites.  Performed nasal saline rinses Continue with nasal Atrovent 2 sprays each nostril up to 3-4 times a day as needed for nasal congestion and drainage Continue nasal Asteline 2 sprays each nostril twice a day for nasal drainage Truett Perna has not been effective May take an over the counter antihistamine such as Claritin, Allegra, Xyzal, or Zyrtec once a day as needed for runny nose Continue Singulair  10mg  nightly  Heartburn Take pantoprazole 20mg  in the morning. Nothing to eat or drink for 30 minutes.  Epistaxis, history of Pinch both nostrils while leaning forward for at least 5 minutes before checking to see if the bleeding has stopped. If bleeding is not  controlled within 5-10 minutes apply a cotton ball soaked with oxymetazoline (Afrin) to the bleeding nostril for a few seconds.  If the problem persists or worsens a referral to ENT for further evaluation may be necessary.  Recurrent infections Keep track of current infections and number of antibiotics needed Obtaining immunocompetence work-up mostly assessing his strep pneumonia titers.  Advised we will go ahead and order this to have available in the office to come back and have labs drawn   Follow up in 4-6 months or sooner if needed

## 2021-10-19 NOTE — Progress Notes (Signed)
Follow-up Note  RE: Jordan [name] MRN: [id number] DOB: 01/26/1976 Date of Office Visit: 10/19/2021   History of present illness: Jordan FloodCharles Poliquin is a 46 y.o. male presenting today for follow-up of asthma and allergies.  He was last seen in the office on by our nurse practitioner Amada Jupiterale.  He states this spring has been rough.  He states since last visit he has had URIs almost each month where he has had antibiotics (he states at least 3 times since last visit).  He reports increase in nasal congestion/drainage as well as cough, shortness of breath.  He needs refills of all his medications at this time.  He states he has been using Breztri 2 puffs twice a day and Qvar 2 puffs twice a day but still may use additional dose of Breztri midday.  He takes singulair daily as well. He states symptoms seem to get worse as the day progress and worse at night.  He is using albuterol multiple times a day as well.  He continues on Nucala monthly injections.  He does feel like Nucala is helpful but he only really gets several days worth of improvement in symptoms before it seems to start wearing off. Fortunately he states he has not needed to go to an urgent care or ED since he has been on Nucala for breathing symptoms. For the allergies under control he does need refills of Xyzal, Atrovent, Astelin.  Issues with recurrent nosebleeds in the past.  He also has issues with reflux control and has been on pantoprazole for this.    Review of systems: Review of Systems  Constitutional: Negative.   HENT:  Positive for congestion and postnasal drip.   Eyes: Negative.   Respiratory:  Positive for cough and shortness of breath.   Cardiovascular: Negative.   Musculoskeletal: Negative.   Skin: Negative.   Allergic/Immunologic: Negative.   Neurological: Negative.     All other systems negative unless noted above in HPI  Past medical/social/surgical/family history have been reviewed and are unchanged unless specifically  indicated below.  No changes  Medication List: Current Outpatient Medications  Medication Sig Dispense Refill   albuterol (PROVENTIL) (2.5 MG/3ML) 0.083% nebulizer solution Take 3 mLs (2.5 mg total) by nebulization every 4 (four) hours as needed for wheezing or shortness of breath. 75 mL 1   amLODipine (NORVASC) 10 MG tablet Take 1 tablet (10 mg total) by mouth daily. 90 tablet 3   buPROPion (WELLBUTRIN XL) 150 MG 24 hr tablet Take 1 tablet (150 mg total) by mouth daily. 90 tablet 1   cyclobenzaprine (FLEXERIL) 10 MG tablet Take 1 tablet (10 mg total) by mouth 3 (three) times daily as needed for muscle spasms (may cause drowsiness do not drive or operate machinary). 30 tablet 0   EPINEPHrine 0.3 mg/0.3 mL IJ SOAJ injection INJECT 0.3 MLS INTO MUSCLE 1 each 2   escitalopram (LEXAPRO) 20 MG tablet Take 1 tablet (20 mg total) by mouth daily. 90 tablet 3   fenofibrate (TRICOR) 145 MG tablet TAKE 1 TABLET(145 MG) BY MOUTH DAILY 90 tablet 1   losartan (COZAAR) 25 MG tablet Take 1 tablet (25 mg total) by mouth daily. 90 tablet 3   naproxen (NAPROSYN) 500 MG tablet Take 1 tablet (500 mg total) by mouth 2 (two) times daily with a meal. 60 tablet 1   NUCALA 100 MG SOLR Inject into the skin.     ondansetron (ZOFRAN) 4 MG tablet Take 1 tablet (4 mg total) by mouth  every 8 (eight) hours as needed for nausea or vomiting. 21 tablet 0   pantoprazole (PROTONIX) 20 MG tablet Take 1 tablet (20 mg total) by mouth daily. 30 tablet 3   albuterol (PROVENTIL HFA) 108 (90 Base) MCG/ACT inhaler Inhale 1-2 puffs into the lungs every 6 (six) hours as needed for wheezing or shortness of breath. 18 g 1   azelastine (ASTELIN) 0.1 % nasal spray 2 sprays per nostril 2 times daily as needed for drainage. 30 mL 5   beclomethasone (QVAR REDIHALER) 80 MCG/ACT inhaler Inhale 2 puffs into the lungs 2 (two) times daily. Rinse mouth out after each use. 10.6 g 5   Budeson-Glycopyrrol-Formoterol (BREZTRI AEROSPHERE) 160-9-4.8 MCG/ACT  AERO Inhale 2 puffs into the lungs in the morning and at bedtime. 10.7 g 5   ipratropium (ATROVENT) 0.06 % nasal spray Place 2 sprays in each nostril up to three times a day as needed 15 mL 5   montelukast (SINGULAIR) 10 MG tablet TAKE 1 TABLET BY MOUTH EVERYDAY AT BEDTIME 30 tablet 5   sucralfate (CARAFATE) 1 g tablet Take 1 tablet (1 g total) by mouth 4 (four) times daily -  with meals and at bedtime. 120 tablet 3   Current Facility-Administered Medications  Medication Dose Route Frequency Provider Last Rate Last Admin   Mepolizumab SOLR 100 mg  100 mg Subcutaneous Q28 days Kennith Gain, MD   100 mg at 10/19/21 1805   permethrin (ELIMITE) 5 % cream   Topical Once Carvel Getting, NP         Known medication allergies: No Known Allergies   Physical examination: Blood pressure 136/88, pulse 66, temperature 97.9 F (36.6 C), resp. rate 19, height 5\' 8"  (1.727 m), weight 190 lb 12.8 oz (86.5 kg), SpO2 99 %.  General: Alert, interactive, in no acute distress. HEENT: PERRLA, TMs pearly gray, turbinates markedly edematous with clear discharge, post-pharynx non erythematous. Neck: Supple without lymphadenopathy. Lungs: Clear to auscultation without wheezing, rhonchi or rales. {no increased work of breathing. CV: Normal S1, S2 without murmurs. Abdomen: Nondistended, nontender. Skin: Warm and dry, without lesions or rashes. Extremities:  No clubbing, cyanosis or edema. Neuro:   Grossly intact.  Diagnositics/Labs:  Spirometry: FEV1: 2.57L 79%, FVC: 3.11L 77%, ratio consistent with essentially nonobstructive pattern  Nucala injection given today  Assessment and plan: Asthma:  Daily controller medication(s):   Breztri 2 puffs twice a day and Qvar 2 puffs twice a day with spacer and rinse mouth afterwards.    Advised if needing additional controller medication he can do more Qvar versus taking more Breztri Continue Singulair 10mg  daily at night.  Prior to physical activity:  May use albuterol rescue inhaler 2 puffs 5 to 15 minutes prior to strenuous physical activities. Rescue medications: May use albuterol rescue inhaler 2 puffs or nebulizer every 4 to 6 hours as needed for shortness of breath, chest tightness, coughing, and wheezing. Monitor frequency of use. Continue Nucala injections monthly for now.  He has been noting waning efficacy of Nucala.  Tezspire injection was discussed today and will talk with our Biologics coordinator to see if this may be something we can change to with insurance coverage  Control goals:  Full participation in all desired activities (may need albuterol before activity) Albuterol use two times or less a week on average (not counting use with activity) Cough interfering with sleep two times or less a month Oral steroids no more than once a year  Seasonal and perennial allergic rhinoconjunctivitis  Past history - 2021 skin testing was positive to grass, tree, mold, dust mites.  Performed nasal saline rinses Continue with nasal Atrovent 2 sprays each nostril up to 3-4 times a day as needed for nasal congestion and drainage Continue nasal Asteline 2 sprays each nostril twice a day for nasal drainage Truett Perna has not been effective May take an over the counter antihistamine such as Claritin, Allegra, Xyzal, or Zyrtec once a day as needed for runny nose Continue Singulair  10mg  nightly  Heartburn Take pantoprazole 20mg  in the morning. Nothing to eat or drink for 30 minutes.  Epistaxis, history of Pinch both nostrils while leaning forward for at least 5 minutes before checking to see if the bleeding has stopped. If bleeding is not controlled within 5-10 minutes apply a cotton ball soaked with oxymetazoline (Afrin) to the bleeding nostril for a few seconds.  If the problem persists or worsens a referral to ENT for further evaluation may be necessary.  Recurrent infections Keep track of current infections and number of antibiotics  needed Obtaining immunocompetence work-up mostly assessing his strep pneumonia titers.  Advised we will go ahead and order this to have available in the office to come back and have labs drawn   Follow up in 4-6 months or sooner if needed  I appreciate the opportunity to take part in Devian's care. Please do not hesitate to contact me with questions.  Sincerely,   Prudy Feeler, MD Allergy/Immunology [facility]

## 2021-10-24 ENCOUNTER — Ambulatory Visit: Payer: 59

## 2021-10-26 ENCOUNTER — Ambulatory Visit: Payer: 59 | Admitting: Internal Medicine

## 2021-11-02 ENCOUNTER — Ambulatory Visit: Payer: 59 | Admitting: Nurse Practitioner

## 2021-11-02 ENCOUNTER — Telehealth: Payer: Self-pay | Admitting: Nurse Practitioner

## 2021-11-02 NOTE — Telephone Encounter (Signed)
Pt was a no show for an OV with [facility] on 11/02/21, I sent a no show letter.

## 2021-11-05 ENCOUNTER — Telehealth: Payer: 59 | Admitting: Family

## 2021-11-05 DIAGNOSIS — J45901 Unspecified asthma with (acute) exacerbation: Secondary | ICD-10-CM | POA: Diagnosis not present

## 2021-11-05 MED ORDER — PREDNISONE 20 MG PO TABS: 40.0000 mg | ORAL_TABLET | Freq: Every day | ORAL | 0 refills | Status: AC

## 2021-11-05 MED ORDER — ALBUTEROL SULFATE HFA 108 (90 BASE) MCG/ACT IN AERS: 2.0000 | INHALATION_SPRAY | Freq: Four times a day (QID) | RESPIRATORY_TRACT | 0 refills | Status: DC | PRN

## 2021-11-05 MED ORDER — MONTELUKAST SODIUM 10 MG PO TABS: ORAL_TABLET | ORAL | 5 refills | Status: DC

## 2021-11-05 NOTE — Progress Notes (Signed)
Virtual Visit Consent   Jordan FloodCharles Berres, you are scheduled for a virtual visit with a [facility] provider today. Just as with appointments in the office, your consent must be obtained to participate. Your consent will be active for this visit and any virtual visit you may have with one of our providers in the next 365 days. If you have a MyChart account, a copy of this consent can be sent to you electronically.  As this is a virtual visit, video technology does not allow for your provider to perform a traditional examination. This may limit your provider's ability to fully assess your condition. If your provider identifies any concerns that need to be evaluated in person or the need to arrange testing (such as labs, EKG, etc.), we will make arrangements to do so. Although advances in technology are sophisticated, we cannot ensure that it will always work on either your end or our end. If the connection with a video visit is poor, the visit may have to be switched to a telephone visit. With either a video or telephone visit, we are not always able to ensure that we have a secure connection.  By engaging in this virtual visit, you consent to the provision of healthcare and authorize for your insurance to be billed (if applicable) for the services provided during this visit. Depending on your insurance coverage, you may receive a charge related to this service.  I need to obtain your verbal consent now. Are you willing to proceed with your visit today? Jordan FloodCharles Glaab has provided verbal consent on 11/05/2021 for a virtual visit (video or telephone). Jannifer Rodneyhristy Kinsly Hild, FNP  Date: 11/05/2021 4:58 PM  Virtual Visit via Video Note   I, Jannifer RodneyChristy Sakura Denis, connected with  Jordan FloodCharles Mckillop  ([id number], 03/07/1976) on 11/05/21 at  5:00 PM EDT by a video-enabled telemedicine application and verified that I am speaking with the correct person using two identifiers.  Location: Patient: Virtual Visit Location Patient: Home Provider:  Virtual Visit Location Provider: Home Office   I discussed the limitations of evaluation and management by telemedicine and the availability of in person appointments. The patient expressed understanding and agreed to proceed.    History of Present Illness: Jordan [name] is a 46 y.o. who identifies as a male who was assigned male at birth, and is being seen today for asthma flare up that started this morning.  HPI: Asthma He complains of chest tightness, cough, frequent throat clearing, shortness of breath, sputum production and wheezing. This is a new problem. The current episode started today. The problem has been unchanged. The cough is productive of sputum. His past medical history is significant for asthma.    Problems:  Patient Active Problem List   Diagnosis Date Noted   Low testosterone in male 09/04/2021   Erectile dysfunction 11/02/2020   Depression, recurrent (HCC) 04/01/2020   Elevated LDL cholesterol level 07/29/2019   SOB (shortness of breath) 07/27/2019   Seasonal and perennial allergic rhinoconjunctivitis 07/20/2019   Asthma 07/20/2019   Heartburn 07/20/2019   Stress and adjustment reaction 07/14/2019   Hypertension 07/14/2019    Allergies: No Known Allergies Medications:  Current Outpatient Medications:    albuterol (VENTOLIN HFA) 108 (90 Base) MCG/ACT inhaler, Inhale 2 puffs into the lungs every 6 (six) hours as needed for wheezing or shortness of breath., Disp: 8 g, Rfl: 0   predniSONE (DELTASONE) 20 MG tablet, Take 2 tablets (40 mg total) by mouth daily with breakfast for 5 days., Disp: 10 tablet,  Rfl: 0   albuterol (PROVENTIL) (2.5 MG/3ML) 0.083% nebulizer solution, Take 3 mLs (2.5 mg total) by nebulization every 4 (four) hours as needed for wheezing or shortness of breath., Disp: 75 mL, Rfl: 1   amLODipine (NORVASC) 10 MG tablet, Take 1 tablet (10 mg total) by mouth daily., Disp: 90 tablet, Rfl: 3   azelastine (ASTELIN) 0.1 % nasal spray, 2 sprays per nostril 2  times daily as needed for drainage., Disp: 30 mL, Rfl: 5   beclomethasone (QVAR REDIHALER) 80 MCG/ACT inhaler, Inhale 2 puffs into the lungs 2 (two) times daily. Rinse mouth out after each use., Disp: 10.6 g, Rfl: 5   Budeson-Glycopyrrol-Formoterol (BREZTRI AEROSPHERE) 160-9-4.8 MCG/ACT AERO, Inhale 2 puffs into the lungs in the morning and at bedtime., Disp: 10.7 g, Rfl: 5   buPROPion (WELLBUTRIN XL) 150 MG 24 hr tablet, Take 1 tablet (150 mg total) by mouth daily., Disp: 90 tablet, Rfl: 1   cyclobenzaprine (FLEXERIL) 10 MG tablet, Take 1 tablet (10 mg total) by mouth 3 (three) times daily as needed for muscle spasms (may cause drowsiness do not drive or operate machinary)., Disp: 30 tablet, Rfl: 0   EPINEPHrine 0.3 mg/0.3 mL IJ SOAJ injection, INJECT 0.3 MLS INTO MUSCLE, Disp: 1 each, Rfl: 2   escitalopram (LEXAPRO) 20 MG tablet, Take 1 tablet (20 mg total) by mouth daily., Disp: 90 tablet, Rfl: 3   fenofibrate (TRICOR) 145 MG tablet, TAKE 1 TABLET(145 MG) BY MOUTH DAILY, Disp: 90 tablet, Rfl: 1   ipratropium (ATROVENT) 0.06 % nasal spray, Place 2 sprays in each nostril up to three times a day as needed, Disp: 15 mL, Rfl: 5   losartan (COZAAR) 25 MG tablet, Take 1 tablet (25 mg total) by mouth daily., Disp: 90 tablet, Rfl: 3   montelukast (SINGULAIR) 10 MG tablet, TAKE 1 TABLET BY MOUTH EVERYDAY AT BEDTIME, Disp: 30 tablet, Rfl: 5   naproxen (NAPROSYN) 500 MG tablet, Take 1 tablet (500 mg total) by mouth 2 (two) times daily with a meal., Disp: 60 tablet, Rfl: 1   NUCALA 100 MG SOLR, Inject into the skin., Disp: , Rfl:    ondansetron (ZOFRAN) 4 MG tablet, Take 1 tablet (4 mg total) by mouth every 8 (eight) hours as needed for nausea or vomiting., Disp: 21 tablet, Rfl: 0   pantoprazole (PROTONIX) 20 MG tablet, Take 1 tablet (20 mg total) by mouth daily., Disp: 30 tablet, Rfl: 3   sucralfate (CARAFATE) 1 g tablet, Take 1 tablet (1 g total) by mouth 4 (four) times daily -  with meals and at bedtime.,  Disp: 120 tablet, Rfl: 3  Current Facility-Administered Medications:    Mepolizumab SOLR 100 mg, 100 mg, Subcutaneous, Q28 days, Marcelyn Bruins, MD, 100 mg at 10/19/21 1805  Observations/Objective: Patient is well-developed, well-nourished in no acute distress.  Resting comfortably  at home.  Head is normocephalic, atraumatic.  No labored breathing.  Speech is clear and coherent with logical content.  Patient is alert and oriented at baseline.  No SOB noted, mild wheezing noted   Assessment and Plan: 1. Moderate asthma with exacerbation, unspecified whether persistent - montelukast (SINGULAIR) 10 MG tablet; TAKE 1 TABLET BY MOUTH EVERYDAY AT BEDTIME  Dispense: 30 tablet; Refill: 5 - predniSONE (DELTASONE) 20 MG tablet; Take 2 tablets (40 mg total) by mouth daily with breakfast for 5 days.  Dispense: 10 tablet; Refill: 0 - albuterol (VENTOLIN HFA) 108 (90 Base) MCG/ACT inhaler; Inhale 2 puffs into the lungs every 6 (six)  hours as needed for wheezing or shortness of breath.  Dispense: 8 g; Refill: 0  Restart Singulair  Prednisone  Avoid allergens  Follow up if symptoms worsen or do not improve    Follow Up Instructions: I discussed the assessment and treatment plan with the patient. The patient was provided an opportunity to ask questions and all were answered. The patient agreed with the plan and demonstrated an understanding of the instructions.  A copy of instructions were sent to the patient via MyChart unless otherwise noted below.    The patient was advised to call back or seek an in-person evaluation if the symptoms worsen or if the condition fails to improve as anticipated.  Time:  I spent 9 minutes with the patient via telehealth technology discussing the above problems/concerns.    Jannifer Rodney, FNP

## 2021-11-06 ENCOUNTER — Telehealth: Payer: Self-pay | Admitting: Nurse Practitioner

## 2021-11-06 NOTE — Telephone Encounter (Signed)
Pt says he has been taking his wellburtrin for a few weeks and he is very irritated and needs some type of change. He says his moods change so frequently, often his family points out his mood swings. Please advise

## 2021-11-06 NOTE — Telephone Encounter (Signed)
Pt would like a call back to discuss his Lexapro and Wellbutrin He is feeling like they are not working.

## 2021-11-06 NOTE — Telephone Encounter (Signed)
I will call him.

## 2021-11-13 IMAGING — DX DG CHEST 2V
2 series · 2 of 2 positions shown · non-contrast
Comparison: July 07, 2019

CLINICAL DATA: Shortness of breath.

EXAM:
CHEST - 2 VIEW

[chest pa]
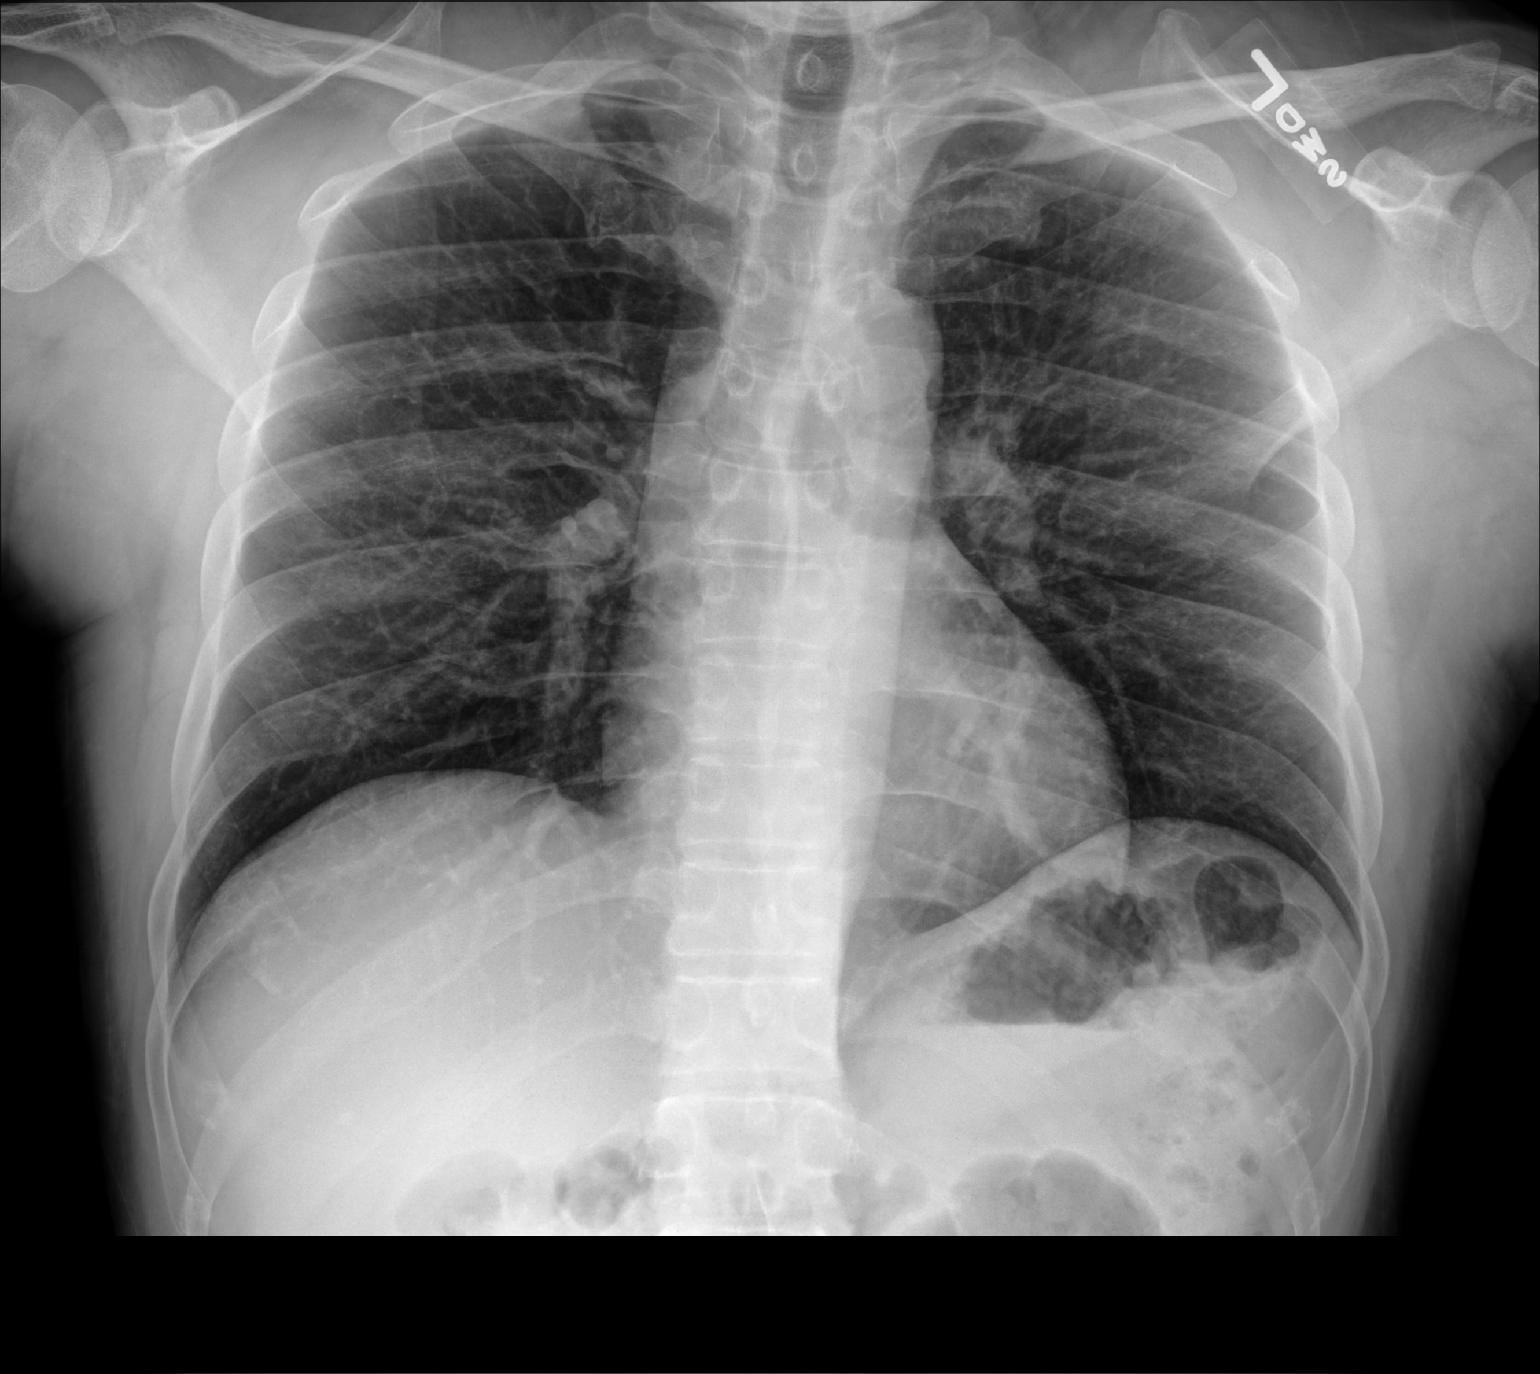

[chest lat]
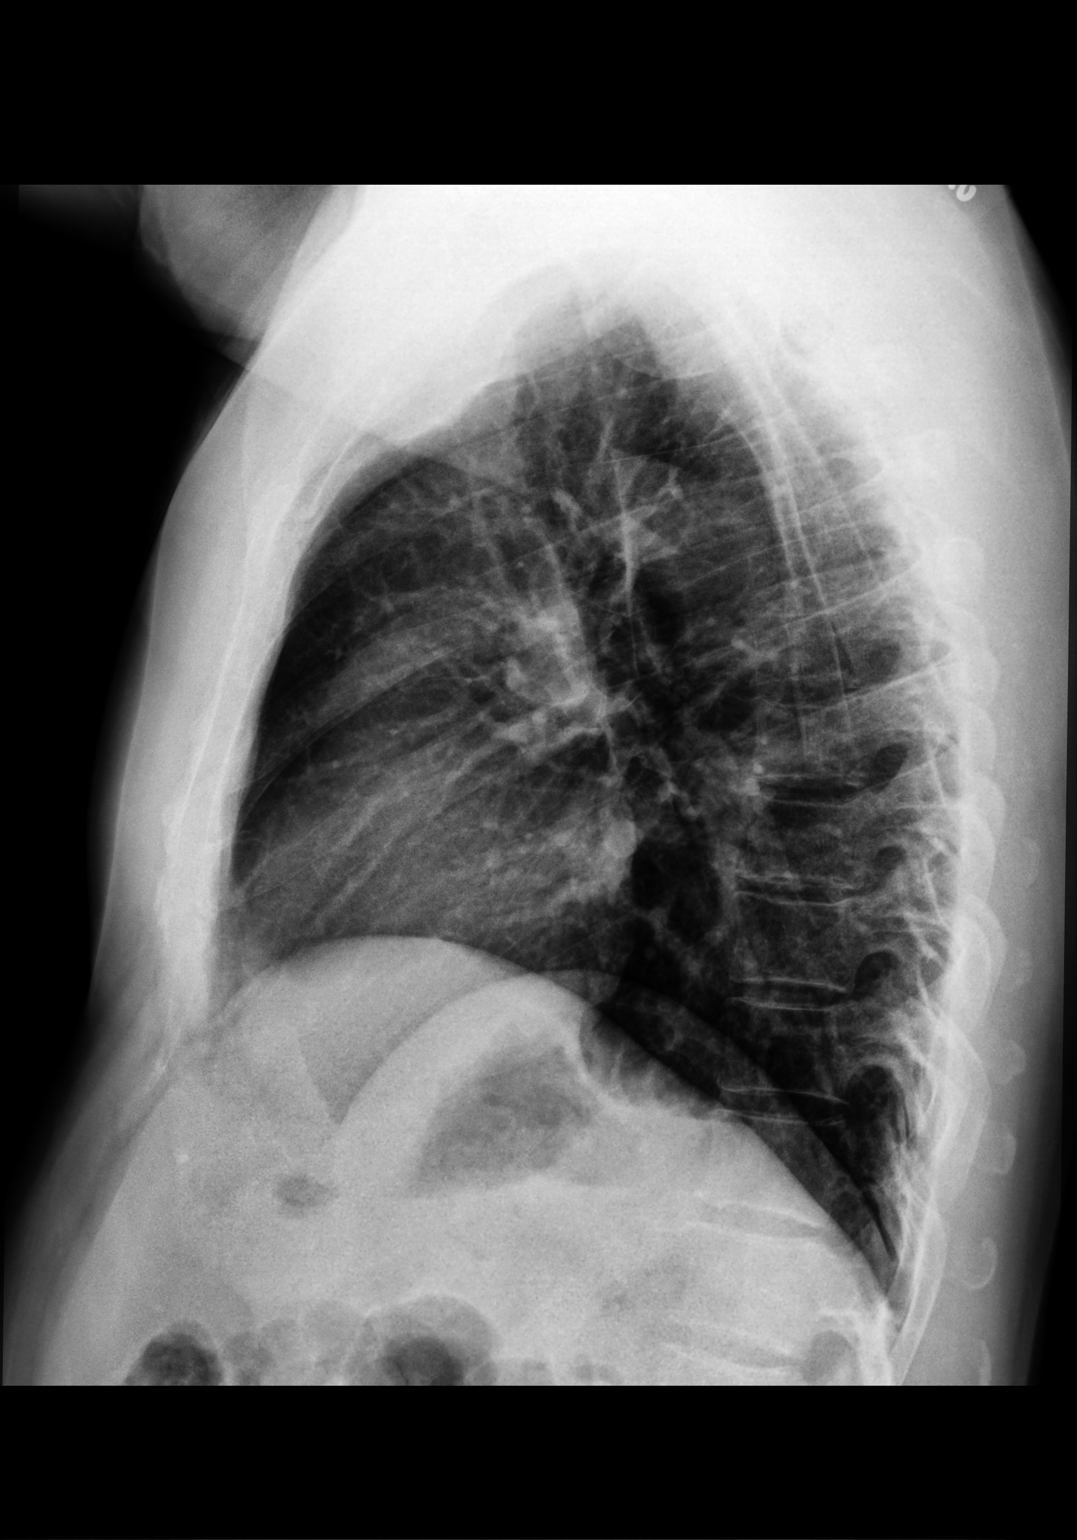

[2 of 2 positions shown; findings below may reference images not displayed]

FINDINGS: The heart size and mediastinal contours are within normal limits.
Both lungs are clear. The visualized skeletal structures are
unremarkable.
IMPRESSION: No active cardiopulmonary disease.

## 2021-11-16 ENCOUNTER — Ambulatory Visit: Payer: 59

## 2021-11-16 ENCOUNTER — Ambulatory Visit: Payer: Self-pay

## 2021-11-16 NOTE — Progress Notes (Signed)
Please disregard error

## 2021-11-18 ENCOUNTER — Telehealth: Payer: 59 | Admitting: Nurse Practitioner

## 2021-11-18 DIAGNOSIS — M25551 Pain in right hip: Secondary | ICD-10-CM

## 2021-11-18 MED ORDER — NAPROXEN 500 MG PO TABS: 500.0000 mg | ORAL_TABLET | Freq: Two times a day (BID) | ORAL | 1 refills | Status: DC

## 2021-11-18 MED ORDER — CYCLOBENZAPRINE HCL 10 MG PO TABS: 10.0000 mg | ORAL_TABLET | Freq: Three times a day (TID) | ORAL | 0 refills | Status: DC | PRN

## 2021-11-21 ENCOUNTER — Ambulatory Visit (INDEPENDENT_AMBULATORY_CARE_PROVIDER_SITE_OTHER): Payer: 59

## 2021-11-21 DIAGNOSIS — J4550 Severe persistent asthma, uncomplicated: Secondary | ICD-10-CM

## 2021-11-23 ENCOUNTER — Encounter: Payer: Self-pay | Admitting: Nurse Practitioner

## 2021-11-23 ENCOUNTER — Ambulatory Visit (INDEPENDENT_AMBULATORY_CARE_PROVIDER_SITE_OTHER): Payer: 59 | Admitting: Nurse Practitioner

## 2021-11-23 VITALS — BP 126/86 | HR 68 | Temp 97.4°F | Ht 68.0 in | Wt 190.2 lb

## 2021-11-23 DIAGNOSIS — F332 Major depressive disorder, recurrent severe without psychotic features: Secondary | ICD-10-CM

## 2021-11-23 DIAGNOSIS — F339 Major depressive disorder, recurrent, unspecified: Secondary | ICD-10-CM | POA: Diagnosis not present

## 2021-11-23 DIAGNOSIS — M25551 Pain in right hip: Secondary | ICD-10-CM | POA: Diagnosis not present

## 2021-11-23 MED ORDER — VORTIOXETINE HBR 10 MG PO TABS: 10.0000 mg | ORAL_TABLET | Freq: Every day | ORAL | 5 refills | Status: DC

## 2021-11-23 NOTE — Progress Notes (Addendum)
Established Patient Visit  Patient: Jordan [name]   DOB: 03/14/1976   46 y.o. Male  MRN: [id number] Visit Date: 11/23/2021  Subjective:    Chief Complaint  Patient presents with   Office Visit    4 week f/u anxiety / depression Has no relief with medications, still irritable . C/o Rt leg / thigh pain x 2 months  Needs a jury duty excuse letter.   Hip Pain  The incident occurred more than 1 week ago. There was no injury mechanism. The pain is present in the left hip and left thigh. The quality of the pain is described as aching. The pain is moderate. The pain has been Constant since onset. Pertinent negatives include no inability to bear weight, loss of motion, loss of sensation, muscle weakness, numbness or tingling. He reports no foreign bodies present. The symptoms are aggravated by weight bearing and movement. He has tried NSAIDs for the symptoms. The treatment provided mild relief.   Depression, recurrent (HCC) No improvement with wellbutrin, lexapro, cymbalta and effexor. No SI/HI or hallucination Did not schedule appt with psychologist due to insurance coverage. He wants to try trintellix.  Advised about need to complete trintellix PA. He is to maintain wellbutrin and lexapro doses at this time till PA is approved. He will then d/c both meds and start trintellix. Entered referral to psychiatry Advised to find psychologist within his insurance network. F/up in 24month     11/23/2021    1:50 PM 09/04/2021    1:19 PM 03/09/2020    1:40 PM  Depression screen PHQ 2/9  Decreased Interest 2 0 1  Down, Depressed, Hopeless 2 1 1   PHQ - 2 Score 4 1 2   Altered sleeping 3 1 1   Tired, decreased energy 2 1 1   Change in appetite 1 0 1  Feeling bad or failure about yourself  3 1 1   Trouble concentrating 3 0 1  Moving slowly or fidgety/restless 3 0 1  Suicidal thoughts 0 0 0  PHQ-9 Score 19 4 8   Difficult doing work/chores Extremely dIfficult Not difficult at all Somewhat  difficult       11/23/2021    1:50 PM 09/04/2021    1:20 PM 03/09/2020    1:40 PM 07/14/2019    3:25 PM  GAD 7 : Generalized Anxiety Score  Nervous, Anxious, on Edge 3 1 1 1   Control/stop worrying 3 1 1 2   Worry too much - different things 3 1 1 2   Trouble relaxing 3 1 1 2   Restless 3 1 1 2   Easily annoyed or irritable 3 1 1 2   Afraid - awful might happen 3 1 1 1   Total GAD 7 Score 21 7 7 12   Anxiety Difficulty Extremely difficult Not difficult at all Somewhat difficult Not difficult at all   Reviewed medical, surgical, and social history today  Medications: Outpatient Medications Prior to Visit  Medication Sig   albuterol (PROVENTIL) (2.5 MG/3ML) 0.083% nebulizer solution Take 3 mLs (2.5 mg total) by nebulization every 4 (four) hours as needed for wheezing or shortness of breath.   albuterol (VENTOLIN HFA) 108 (90 Base) MCG/ACT inhaler Inhale 2 puffs into the lungs every 6 (six) hours as needed for wheezing or shortness of breath.   amLODipine (NORVASC) 10 MG tablet Take 1 tablet (10 mg total) by mouth daily.   azelastine (ASTELIN) 0.1 % nasal spray 2 sprays per nostril  2 times daily as needed for drainage.   beclomethasone (QVAR REDIHALER) 80 MCG/ACT inhaler Inhale 2 puffs into the lungs 2 (two) times daily. Rinse mouth out after each use.   Budeson-Glycopyrrol-Formoterol (BREZTRI AEROSPHERE) 160-9-4.8 MCG/ACT AERO Inhale 2 puffs into the lungs in the morning and at bedtime.   cyclobenzaprine (FLEXERIL) 10 MG tablet Take 1 tablet (10 mg total) by mouth 3 (three) times daily as needed for muscle spasms (may cause drowsiness do not drive or operate machinary).   EPINEPHrine 0.3 mg/0.3 mL IJ SOAJ injection INJECT 0.3 MLS INTO MUSCLE   fenofibrate (TRICOR) 145 MG tablet TAKE 1 TABLET(145 MG) BY MOUTH DAILY   ipratropium (ATROVENT) 0.06 % nasal spray Place 2 sprays in each nostril up to three times a day as needed   losartan (COZAAR) 25 MG tablet Take 1 tablet (25 mg total) by mouth  daily.   montelukast (SINGULAIR) 10 MG tablet TAKE 1 TABLET BY MOUTH EVERYDAY AT BEDTIME   naproxen (NAPROSYN) 500 MG tablet Take 1 tablet (500 mg total) by mouth 2 (two) times daily with a meal.   NUCALA 100 MG SOLR Inject into the skin.   ondansetron (ZOFRAN) 4 MG tablet Take 1 tablet (4 mg total) by mouth every 8 (eight) hours as needed for nausea or vomiting.   pantoprazole (PROTONIX) 20 MG tablet Take 1 tablet (20 mg total) by mouth daily.   [DISCONTINUED] buPROPion (WELLBUTRIN XL) 150 MG 24 hr tablet Take 1 tablet (150 mg total) by mouth daily.   [DISCONTINUED] escitalopram (LEXAPRO) 20 MG tablet Take 1 tablet (20 mg total) by mouth daily.   sucralfate (CARAFATE) 1 g tablet Take 1 tablet (1 g total) by mouth 4 (four) times daily -  with meals and at bedtime.   Facility-Administered Medications Prior to Visit  Medication Dose Route Frequency Provider   Mepolizumab SOLR 100 mg  100 mg Subcutaneous Q28 days Marcelyn Bruins, MD   Reviewed past medical and social history.   ROS per HPI above      Objective:  BP 126/86 (BP Location: Right Arm, Patient Position: Sitting, Cuff Size: Normal)   Pulse 68   Temp (!) 97.4 F (36.3 C) (Temporal)   Ht 5\' 8"  (1.727 m)   Wt 190 lb 3.2 oz (86.3 kg)   SpO2 98%   BMI 28.92 kg/m      Physical Exam Vitals and nursing note reviewed. Exam conducted with a chaperone present.  Cardiovascular:     Rate and Rhythm: Normal rate.     Pulses: Normal pulses.  Pulmonary:     Effort: Pulmonary effort is normal.  Abdominal:     Palpations: Abdomen is soft.     Tenderness: There is no abdominal tenderness. There is no guarding.     Hernia: There is no hernia in the right inguinal area.  Musculoskeletal:     Right hip: Tenderness present. No bony tenderness or crepitus. Normal range of motion. Normal strength.     Left hip: Normal.     Right upper leg: Normal.     Left upper leg: Normal.     Right knee: Normal.     Left knee: Normal.   Lymphadenopathy:     Lower Body: No right inguinal adenopathy.  Neurological:     Mental Status: He is alert and oriented to person, place, and time.     No results found for any visits on 11/23/21.    Assessment & Plan:    Problem List Items  Addressed This Visit       Other   Depression, recurrent (HCC) - Primary    No improvement with wellbutrin, lexapro, cymbalta and effexor. No SI/HI or hallucination Did not schedule appt with psychologist due to insurance coverage. He wants to try trintellix.  Advised about need to complete trintellix PA. He is to maintain wellbutrin and lexapro doses at this time till PA is approved. He will then d/c both meds and start trintellix. Entered referral to psychiatry Advised to find psychologist within his insurance network. F/up in 79month      Relevant Medications   vortioxetine HBr (TRINTELLIX) 10 MG TABS tablet   Other Visit Diagnoses     Pain of right hip       Relevant Orders   Ambulatory referral to Physical Therapy     Wait for prior authorization to be completed for trintellix. If approved, you should stop lexapro and wellbutrin, then start trintellix. You will be contacted to schedule appt with psychiatry and with PT. Continue naproxen BID with food and flexeril at bedtime till complete Start home exercise Apply cold compress after exercise.  Return in about 4 weeks (around 12/21/2021) for anxiety and depression.     Alysia Penna, NP

## 2021-11-23 NOTE — Patient Instructions (Addendum)
Wait for prior authorization to be completed for trintellix. If approved, you should stop lexapro and wellbutrin, then start trintellix. You will be contacted to schedule appt with psychiatry and with PT. Continue naproxen BID with food and flexeril at bedtime till complete Start home exercise Apply cold compress after exercise.  Piriformis Syndrome Rehab Ask your health care provider which exercises are safe for you. Do exercises exactly as told by your health care provider and adjust them as directed. It is normal to feel mild stretching, pulling, tightness, or discomfort as you do these exercises. Stop right away if you feel sudden pain or your pain gets worse. Do not begin these exercises until told by your health care provider. Stretching and range-of-motion exercises These exercises warm up your muscles and joints and improve the movement and flexibility of your hip and pelvis. The exercises also help to relieve pain, numbness, and tingling. Nerve root  Sit on a firm surface that is high enough that you can swing your left / right foot freely. Place a folded towel under your left / right thigh. This is optional. Drop your head forward and round your back. While you keep your left / right foot relaxed, slowly straighten your left / right knee until you feel a slight pull behind your knee or calf. If your leg is fully extended and you still do not feel a pull, slowly tilt your foot and toes toward you. Hold this position for __________ seconds. Slowly return your knee to its starting position. Hip rotation This is an exercise in which you lie on your back and stretch the muscles that rotate your hip (hip rotators) to stretch your buttocks. Lie on your back on a firm surface. Pull your left / right knee toward your same shoulder with your left / right hand until your knee is pointing toward the ceiling. Hold your left / right ankle with your other hand. Keeping your knee steady, gently pull  your left / right ankle toward your other shoulder until you feel a stretch in your buttocks. Hold this position for __________ seconds. Repeat __________ times. Complete this exercise __________ times a day. Hip extensor This is an exercise in which you lie on your back and pull your knee to your chest. Lie on your back on a firm surface. Both of your legs should be straight. Pull your left / right knee to your chest. Hold your leg in this position by holding on to the back of your thigh or the front of your knee. Hold this position for __________ seconds. Slowly return to the starting position. Repeat __________ times. Complete this exercise __________ times a day. Strengthening exercises These exercises build strength and endurance in your hip and thigh muscles. Endurance is the ability to use your muscles for a long time, even after they get tired. Straight leg raises, side-lying This exercise strengthens the muscles that rotate the leg at the hip and move it away from your body (hip abductors). Lie on your side with your left / right leg in the top position. Lie so your head, shoulder, knee, and hip line up. Bend your bottom knee to help you balance. Lift your top leg 4-6 inches (10-15 cm) while keeping your toes pointed straight ahead. Hold this position for __________ seconds. Slowly lower your leg to the starting position. Let your muscles relax completely after each repetition. Repeat __________ times. Complete this exercise __________ times a day. Hip abduction and rotation This is sometimes called  quadruped (on hands and knees) exercises. Get on your hands and knees on a firm, lightly padded surface. Your hands should be directly below your shoulders, and your knees should be directly below your hips. Lift your left / right knee out to the side. Keep your knee bent. Do not twist your body. Hold this position for __________ seconds. Slowly lower your leg. Repeat __________ times.  Complete this exercise __________ times a day. Straight leg raises, prone This exercise stretches the muscles that move the hips (hip extensors). Lie on your abdomen on a firm surface (prone position). Tense the muscles in your buttocks and lift your left / right leg about 4 inches (10 cm). Keep your knee straight as you lift your leg. If you cannot lift your leg that high without arching your back, place a pillow under your hips. Hold this position for __________ seconds. Slowly lower your leg to the starting position. Let your muscles relax completely after each repetition. Repeat __________ times. Complete this exercise __________ times a day. This information is not intended to replace advice given to you by your health care provider. Make sure you discuss any questions you have with your health care provider. Document Revised: 11/15/2020 Document Reviewed: 11/15/2020 Elsevier Patient Education  2023 [employer].

## 2021-11-23 NOTE — Assessment & Plan Note (Signed)
No improvement with wellbutrin, lexapro, cymbalta and effexor. No SI/HI or hallucination Did not schedule appt with psychologist due to insurance coverage. He wants to try trintellix.  Advised about need to complete trintellix PA. He is to maintain wellbutrin and lexapro doses at this time till PA is approved. He will then d/c both meds and start trintellix. Entered referral to psychiatry Advised to find psychologist within his insurance network. F/up in 25month

## 2021-11-24 ENCOUNTER — Telehealth: Payer: Self-pay | Admitting: Nurse Practitioner

## 2021-11-24 NOTE — Telephone Encounter (Signed)
Pt stated the Trintilix is too costly. He would like something else called in. Please send to [facility] on [address]

## 2021-11-27 NOTE — Telephone Encounter (Signed)
Left VM, adv pt to call back  

## 2021-11-29 ENCOUNTER — Telehealth: Payer: Self-pay | Admitting: Nurse Practitioner

## 2021-11-29 NOTE — Telephone Encounter (Signed)
Pt called last week about his Lexapro and Wellbutrin. He requested being changed to Trintellix. He stated he is having some side effects from the new med. Headaches vibrations in the back of his head.Marland Kitchen He is more irritable and nautious. He wants to know should he stop taking it and go back to lexapro and wellbutrin. He knows [name] is out of town and wondered if another provider could advise.

## 2021-12-01 NOTE — Telephone Encounter (Signed)
Pt called today and I relayed Dr Ma Hillock response. Pt had chosen to go off trintilix but is going to start again and try to stick with.

## 2021-12-01 NOTE — Telephone Encounter (Signed)
Noted  

## 2021-12-05 ENCOUNTER — Ambulatory Visit: Payer: 59 | Admitting: Internal Medicine

## 2021-12-06 ENCOUNTER — Ambulatory Visit: Payer: 59 | Admitting: Nurse Practitioner

## 2021-12-18 ENCOUNTER — Encounter: Payer: Self-pay | Admitting: Nurse Practitioner

## 2021-12-18 NOTE — Telephone Encounter (Signed)
2nd no show, fee charged, mailing final warning letter

## 2021-12-19 ENCOUNTER — Ambulatory Visit: Payer: 59

## 2021-12-26 ENCOUNTER — Ambulatory Visit (INDEPENDENT_AMBULATORY_CARE_PROVIDER_SITE_OTHER): Payer: 59

## 2021-12-26 DIAGNOSIS — J4550 Severe persistent asthma, uncomplicated: Secondary | ICD-10-CM | POA: Diagnosis not present

## 2021-12-28 ENCOUNTER — Telehealth: Payer: 59 | Admitting: Physician Assistant

## 2021-12-28 ENCOUNTER — Ambulatory Visit: Payer: 59 | Admitting: Internal Medicine

## 2021-12-28 DIAGNOSIS — J029 Acute pharyngitis, unspecified: Secondary | ICD-10-CM | POA: Diagnosis not present

## 2021-12-28 NOTE — Progress Notes (Signed)
E-Visit for Sore Throat   We are sorry that you are not feeling well.  Here is how we plan to help!   Your symptoms indicate a likely viral infection (Pharyngitis).   Pharyngitis is inflammation in the back of the throat which can cause a sore throat, scratchiness and sometimes difficulty swallowing.   Pharyngitis is typically caused by a respiratory virus and will just run its course.  Please keep in mind that your symptoms could last up to 10 days.  For throat pain, we recommend over the counter oral pain relief medications such as acetaminophen or aspirin, or anti-inflammatory medications such as ibuprofen or naproxen sodium.  Topical treatments such as oral throat lozenges or sprays may be used as needed.  Avoid close contact with loved ones, especially the very young and elderly.  Remember to wash your hands thoroughly throughout the day as this is the number one way to prevent the spread of infection and wipe down door knobs and counters with disinfectant.   After careful review of your answers, I would not recommend an antibiotic for your condition.  Antibiotics should not be used to treat conditions that we suspect are caused by viruses like the virus that causes the common cold or flu. However, some people can have Strep with atypical symptoms. You may need formal testing in clinic or office to confirm if your symptoms continue or worsen.   Providers prescribe antibiotics to treat infections caused by bacteria. Antibiotics are very powerful in treating bacterial infections when they are used properly.  To maintain their effectiveness, they should be used only when necessary.  Overuse of antibiotics has resulted in the development of super bugs that are resistant to treatment!     Home Care: Only take medications as instructed by your medical team. Do not drink alcohol while taking these medications. A steam or ultrasonic humidifier can help congestion.  You can place a towel over your head and  breathe in the steam from hot water coming from a faucet. Avoid close contacts especially the very young and the elderly. Cover your mouth when you cough or sneeze. Always remember to wash your hands.   Get Help Right Away If: You develop worsening fever or throat pain. You develop a severe head ache or visual changes. Your symptoms persist after you have completed your treatment plan.   Make sure you Understand these instructions. Will watch your condition. Will get help right away if you are not doing well or get worse.     Thank you for choosing an e-visit.   Your e-visit answers were reviewed by a board certified advanced clinical practitioner to complete your personal care plan. Depending upon the condition, your plan could have included both over the counter or prescription medications.   Please review your pharmacy choice. Make sure the pharmacy is open so you can pick up prescription now. If there is a problem, you may contact your provider through MyChart messaging and have the prescription routed to another pharmacy.  Your safety is important to us. If you have drug allergies check your prescription carefully.    For the next 24 hours you can use MyChart to ask questions about today's visit, request a non-urgent call back, or ask for a work or school excuse. You will get an email in the next two days asking about your experience. I hope that your e-visit has been valuable and will speed your recovery.  

## 2021-12-28 NOTE — Progress Notes (Signed)
I have spent 5 minutes in review of e-visit questionnaire, review and updating patient chart, medical decision making and response to patient.   Shantanique Hodo Cody Tandy Lewin, PA-C    

## 2022-01-09 ENCOUNTER — Telehealth: Payer: Self-pay | Admitting: Nurse Practitioner

## 2022-01-09 ENCOUNTER — Telehealth: Payer: Self-pay | Admitting: Allergy

## 2022-01-09 NOTE — Telephone Encounter (Signed)
Pt has questions about his pantrolpozol he is taking it two times dailey instead on one to avoid the nausea med. He is only taking lexapro and wellbutrin because the side effects of the

## 2022-01-09 NOTE — Telephone Encounter (Signed)
Patient called and said that he needs to have the protonix 20 tab called in to take 2 a day instead of one because he is running out . [facility] . [phone]

## 2022-01-09 NOTE — Telephone Encounter (Signed)
Left pt a detailed VM

## 2022-01-09 NOTE — Telephone Encounter (Signed)
Called and spoke with patient and he stated that he has been taking Protonix two times daily to help with heartburn and nausea. Previously prescription has it for once a day, would it be ok to increase?

## 2022-01-10 MED ORDER — PANTOPRAZOLE SODIUM 20 MG PO TBEC: 20.0000 mg | DELAYED_RELEASE_TABLET | Freq: Two times a day (BID) | ORAL | 3 refills | Status: DC

## 2022-01-10 NOTE — Telephone Encounter (Signed)
Spoke with patient informed him of the increase in Protonix and informed him a new prescription has been sent to the requested pharmacy.

## 2022-01-10 NOTE — Telephone Encounter (Signed)
It sounds like it has been working! Ok to increase.   Malachi Bonds, MD [facility]

## 2022-01-11 ENCOUNTER — Telehealth: Payer: 59 | Admitting: Physician Assistant

## 2022-01-11 DIAGNOSIS — T148XXA Other injury of unspecified body region, initial encounter: Secondary | ICD-10-CM | POA: Diagnosis not present

## 2022-01-11 NOTE — Progress Notes (Signed)
Jordan [name] are scheduled for a virtual visit with your provider today.    Just as we do with appointments in the office, we must obtain your consent to participate.  Your consent will be active for this visit and any virtual visit you may have with one of our providers in the next 365 days.    If you have a MyChart account, I can also send a copy of this consent to you electronically.  All virtual visits are billed to your insurance company just like a traditional visit in the office.  As this is a virtual visit, video technology does not allow for your provider to perform a traditional examination.  This may limit your provider's ability to fully assess your condition.  If your provider identifies any concerns that need to be evaluated in person or the need to arrange testing such as labs, EKG, etc, we will make arrangements to do so.    Although advances in technology are sophisticated, we cannot ensure that it will always work on either your end or our end.  If the connection with a video visit is poor, we may have to switch to a telephone visit.  With either a video or telephone visit, we are not always able to ensure that we have a secure connection.   I need to obtain your verbal consent now.   Are you willing to proceed with your visit today?   Jordan [name] has provided verbal consent on 01/11/2022 for a virtual visit (video or telephone).   Jordan Meres, PA-C 01/11/2022  4:04 PM   Date:  01/11/2022   ID:  Jordan [name], DOB 1975/12/11, MRN [id number]  Patient Location: Home Provider Location: Home Office   Participants: Patient and Provider for Visit and Wrap up  Method of visit: Video  Location of Patient: Home Location of Provider: Home Office Consent was obtain for visit over the video. Services rendered by provider: Visit was performed via video  A video enabled telemedicine application was used and I verified that I am speaking with the correct person using two  identifiers.  PCP:  Anne Ng, NP   Chief Complaint:  bruising  History of Present Illness:    Jordan [name] is a 46 y.o. male with history as stated below. Presents video telehealth for an acute care visit  Pt has 2 bruises on his left arm that he noticed today. He denies any known trauma. Denies bleeding elsewhere such as no hematochezia, melena, hematemesis, bleeding gums, epistaxis. Denies syncope.  Past Medical, Surgical, Social History, Allergies, and Medications have been Reviewed.  Past Medical History:  Diagnosis Date   Asthma    Elevated LFTs 07/29/2019   Reflux esophagitis     No outpatient medications have been marked as taking for the 01/11/22 encounter (Video Visit) with [facility] PROVIDER.   Current Facility-Administered Medications for the 01/11/22 encounter (Video Visit) with [facility] PROVIDER  Medication   Mepolizumab SOLR 100 mg     Allergies:   Patient has no known allergies.   ROS See HPI for history of present illness.  Physical Exam Musculoskeletal:     Comments: 2cm area of ecchymosis, to the lue  Neurological:     Mental Status: He is alert.    MDM: pt with atraumatic bruising. No anticoagulated. No bleeding elsewhere, feel he can f/u as outpt with pcp. Will likely need labs to confirm no plt disorder. Appears stable at this time.  There are no diagnoses linked to this encounter.   Time:   Today, I have spent 8 minutes with the patient with telehealth technology discussing the above problems, reviewing the chart, previous notes, medications and orders.    Tests Ordered: No orders of the defined types were placed in this encounter.   Medication Changes: No orders of the defined types were placed in this encounter.    Disposition:  Follow up  Signed, Jordan Meres, PA-C  01/11/2022 4:04 PM

## 2022-01-11 NOTE — Patient Instructions (Signed)
[name], thank you for joining Karrie Meres, PA-C for today's virtual visit.  While this provider is not your primary care provider (PCP), if your PCP is located in our provider database this encounter information will be shared with them immediately following your visit.  Consent: (Patient) [name] provided verbal consent for this virtual visit at the beginning of the encounter.  Current Medications:  Current Outpatient Medications:    albuterol (PROVENTIL) (2.5 MG/3ML) 0.083% nebulizer solution, Take 3 mLs (2.5 mg total) by nebulization every 4 (four) hours as needed for wheezing or shortness of breath., Disp: 75 mL, Rfl: 1   albuterol (VENTOLIN HFA) 108 (90 Base) MCG/ACT inhaler, Inhale 2 puffs into the lungs every 6 (six) hours as needed for wheezing or shortness of breath., Disp: 8 g, Rfl: 0   amLODipine (NORVASC) 10 MG tablet, Take 1 tablet (10 mg total) by mouth daily., Disp: 90 tablet, Rfl: 3   azelastine (ASTELIN) 0.1 % nasal spray, 2 sprays per nostril 2 times daily as needed for drainage., Disp: 30 mL, Rfl: 5   beclomethasone (QVAR REDIHALER) 80 MCG/ACT inhaler, Inhale 2 puffs into the lungs 2 (two) times daily. Rinse mouth out after each use., Disp: 10.6 g, Rfl: 5   Budeson-Glycopyrrol-Formoterol (BREZTRI AEROSPHERE) 160-9-4.8 MCG/ACT AERO, Inhale 2 puffs into the lungs in the morning and at bedtime., Disp: 10.7 g, Rfl: 5   cyclobenzaprine (FLEXERIL) 10 MG tablet, Take 1 tablet (10 mg total) by mouth 3 (three) times daily as needed for muscle spasms (may cause drowsiness do not drive or operate machinary)., Disp: 30 tablet, Rfl: 0   EPINEPHrine 0.3 mg/0.3 mL IJ SOAJ injection, INJECT 0.3 MLS INTO MUSCLE, Disp: 1 each, Rfl: 2   fenofibrate (TRICOR) 145 MG tablet, TAKE 1 TABLET(145 MG) BY MOUTH DAILY, Disp: 90 tablet, Rfl: 1   ipratropium (ATROVENT) 0.06 % nasal spray, Place 2 sprays in each nostril up to three times a day as needed, Disp: 15 mL, Rfl: 5   losartan (COZAAR)  25 MG tablet, Take 1 tablet (25 mg total) by mouth daily., Disp: 90 tablet, Rfl: 3   montelukast (SINGULAIR) 10 MG tablet, TAKE 1 TABLET BY MOUTH EVERYDAY AT BEDTIME, Disp: 30 tablet, Rfl: 5   naproxen (NAPROSYN) 500 MG tablet, Take 1 tablet (500 mg total) by mouth 2 (two) times daily with a meal., Disp: 60 tablet, Rfl: 1   NUCALA 100 MG SOLR, Inject into the skin., Disp: , Rfl:    ondansetron (ZOFRAN) 4 MG tablet, Take 1 tablet (4 mg total) by mouth every 8 (eight) hours as needed for nausea or vomiting., Disp: 21 tablet, Rfl: 0   pantoprazole (PROTONIX) 20 MG tablet, Take 1 tablet (20 mg total) by mouth 2 (two) times daily., Disp: 60 tablet, Rfl: 3   sucralfate (CARAFATE) 1 g tablet, Take 1 tablet (1 g total) by mouth 4 (four) times daily -  with meals and at bedtime., Disp: 120 tablet, Rfl: 3   vortioxetine HBr (TRINTELLIX) 10 MG TABS tablet, Take 1 tablet (10 mg total) by mouth daily., Disp: 30 tablet, Rfl: 5  Current Facility-Administered Medications:    Mepolizumab SOLR 100 mg, 100 mg, Subcutaneous, Q28 days, Marcelyn Bruins, MD, 100 mg at 12/26/21 1745   Medications ordered in this encounter:  No orders of the defined types were placed in this encounter.    *If you need refills on other medications prior to your next appointment, please contact your pharmacy*  Follow-Up: Call back or  seek an in-person evaluation if the symptoms worsen or if the condition fails to improve as anticipated.  Other Instructions Follow up with your regular doctor in 1 week for reassessment and seek care sooner if your symptoms worsen or fail to improve.    If you have been instructed to have an in-person evaluation today at a local Urgent Care facility, please use the link below. It will take you to a list of all of our available [facility], including address, phone number and hours of operation. Please do not delay care.  [facility]  If you or a family member do  not have a primary care provider, use the link below to schedule a visit and establish care. When you choose a [facility] primary care physician or advanced practice provider, you gain a long-term partner in health. Find a Primary Care Provider  Learn more about [facility] in-office and virtual care options: [facility] - Get Care Now

## 2022-01-13 ENCOUNTER — Other Ambulatory Visit: Payer: Self-pay | Admitting: Nurse Practitioner

## 2022-01-23 ENCOUNTER — Ambulatory Visit: Payer: 59

## 2022-01-25 ENCOUNTER — Ambulatory Visit: Payer: 59

## 2022-01-25 ENCOUNTER — Ambulatory Visit (INDEPENDENT_AMBULATORY_CARE_PROVIDER_SITE_OTHER): Payer: 59

## 2022-01-25 ENCOUNTER — Ambulatory Visit (INDEPENDENT_AMBULATORY_CARE_PROVIDER_SITE_OTHER): Payer: Medicare HMO | Admitting: Nurse Practitioner

## 2022-01-25 ENCOUNTER — Encounter: Payer: Self-pay | Admitting: Nurse Practitioner

## 2022-01-25 VITALS — BP 118/76 | HR 78 | Temp 97.0°F | Ht 68.0 in | Wt 186.8 lb

## 2022-01-25 DIAGNOSIS — F339 Major depressive disorder, recurrent, unspecified: Secondary | ICD-10-CM | POA: Diagnosis not present

## 2022-01-25 DIAGNOSIS — R1031 Right lower quadrant pain: Secondary | ICD-10-CM

## 2022-01-25 DIAGNOSIS — M1611 Unilateral primary osteoarthritis, right hip: Secondary | ICD-10-CM

## 2022-01-25 DIAGNOSIS — R69 Illness, unspecified: Secondary | ICD-10-CM | POA: Diagnosis not present

## 2022-01-25 DIAGNOSIS — Z1211 Encounter for screening for malignant neoplasm of colon: Secondary | ICD-10-CM | POA: Diagnosis not present

## 2022-01-25 NOTE — Progress Notes (Signed)
Established Patient Visit  Patient: Jordan [name]   DOB: 1976/04/15   46 y.o. Male  MRN: [id number] Visit Date: 01/25/2022  Subjective:    Chief Complaint  Patient presents with   Acute Visit    C/o leg pain, mostly in right leg. Would like an X-ray Says he stopped taking trintellix because it made him feel worse.  Interested in cologard kit    HPI Depression, recurrent ([place]) Unable to tolerate trintellix Has upcoming appt with psychiatry: 02/05/2022 No SI/Hi/hallucination at this time.  Right groin pain Onset 10/2021, pain is constant, worse with weight bearing activity, denies any injury at onset of pain, has unsteady gait and right leg weakness due to pain, [ain radiates to anterior thigh. No improvement with NSAIDs and muscle relaxant. denies any change in GI/GU function or saddle paresthesia or back pain or groin swelling or testicular swelling.  Get hip x-ray today. May need groin [place] if normal x-ray. Will refer for PT if normal [place].  Reviewed medical, surgical, and social history today  Medications: Outpatient Medications Prior to Visit  Medication Sig   albuterol (PROVENTIL) (2.5 MG/3ML) 0.083% nebulizer solution Take 3 mLs (2.5 mg total) by nebulization every 4 (four) hours as needed for wheezing or shortness of breath.   albuterol (VENTOLIN HFA) 108 (90 Base) MCG/ACT inhaler Inhale 2 puffs into the lungs every 6 (six) hours as needed for wheezing or shortness of breath.   amLODipine (NORVASC) 10 MG tablet Take 1 tablet (10 mg total) by mouth daily.   azelastine (ASTELIN) 0.1 % nasal spray 2 sprays per nostril 2 times daily as needed for drainage.   beclomethasone (QVAR REDIHALER) 80 MCG/ACT inhaler Inhale 2 puffs into the lungs 2 (two) times daily. Rinse mouth out after each use.   Budeson-Glycopyrrol-Formoterol (BREZTRI AEROSPHERE) 160-9-4.8 MCG/ACT AERO Inhale 2 puffs into the lungs in the morning and at bedtime.   buPROPion (WELLBUTRIN XL) 150 MG 24 hr  tablet Take 150 mg by mouth daily.   EPINEPHrine 0.3 mg/0.3 mL IJ SOAJ injection INJECT 0.3 MLS INTO MUSCLE   fenofibrate (TRICOR) 145 MG tablet TAKE 1 TABLET(145 MG) BY MOUTH DAILY   ipratropium (ATROVENT) 0.06 % nasal spray Place 2 sprays in each nostril up to three times a day as needed   losartan (COZAAR) 25 MG tablet Take 1 tablet (25 mg total) by mouth daily.   montelukast (SINGULAIR) 10 MG tablet TAKE 1 TABLET BY MOUTH EVERYDAY AT BEDTIME   NUCALA 100 MG SOLR Inject into the skin.   ondansetron (ZOFRAN) 4 MG tablet Take 1 tablet (4 mg total) by mouth every 8 (eight) hours as needed for nausea or vomiting.   pantoprazole (PROTONIX) 20 MG tablet Take 1 tablet (20 mg total) by mouth 2 (two) times daily.   cyclobenzaprine (FLEXERIL) 10 MG tablet Take 1 tablet (10 mg total) by mouth 3 (three) times daily as needed for muscle spasms (may cause drowsiness do not drive or operate machinary). (Patient not taking: Reported on 01/25/2022)   naproxen (NAPROSYN) 500 MG tablet Take 1 tablet (500 mg total) by mouth 2 (two) times daily with a meal. (Patient not taking: Reported on 01/25/2022)   sucralfate (CARAFATE) 1 g tablet Take 1 tablet (1 g total) by mouth 4 (four) times daily -  with meals and at bedtime.   vortioxetine HBr (TRINTELLIX) 10 MG TABS tablet Take 1 tablet (10 mg total) by mouth daily. (  Patient not taking: Reported on 01/25/2022)   Facility-Administered Medications Prior to Visit  Medication Dose Route Frequency Provider   Mepolizumab SOLR 100 mg  100 mg Subcutaneous Q28 days Kennith Gain, MD   Reviewed past medical and social history.   ROS per HPI above      Objective:  BP 118/76 (BP Location: Right Arm, Patient Position: Sitting, Cuff Size: Normal)   Pulse 78   Temp (!) 97 F (36.1 C) (Temporal)   Ht $R'5\' 8"'SS$  (1.727 m)   Wt 186 lb 12.8 oz (84.7 kg)   SpO2 99%   BMI 28.40 kg/m      Physical Exam Cardiovascular:     Rate and Rhythm: Normal rate.     Pulses:  Normal pulses.  Pulmonary:     Effort: Pulmonary effort is normal.  Abdominal:     Hernia: There is no hernia in the left inguinal area or right inguinal area.  Musculoskeletal:        General: Normal range of motion.     Lumbar back: Normal.     Right hip: Tenderness present. No deformity, bony tenderness or crepitus. Normal range of motion. Normal strength.     Left hip: Normal.     Right upper leg: Normal.     Left upper leg: Normal.     Right knee: Normal.     Left knee: Normal.     Right lower leg: Normal. No edema.     Left lower leg: Normal. No edema.  Lymphadenopathy:     Lower Body: No right inguinal adenopathy. No left inguinal adenopathy.  Skin:    Findings: No erythema or rash.  Neurological:     Mental Status: He is alert and oriented to person, place, and time.     No results found for any visits on 01/25/22.    Assessment & Plan:    Problem List Items Addressed This Visit       Other   Depression, recurrent ([place])    Unable to tolerate trintellix Has upcoming appt with psychiatry: 02/05/2022 No SI/Hi/hallucination at this time.      Relevant Medications   buPROPion (WELLBUTRIN XL) 150 MG 24 hr tablet   Right groin pain - Primary    Onset 10/2021, pain is constant, worse with weight bearing activity, denies any injury at onset of pain, has unsteady gait and right leg weakness due to pain, [ain radiates to anterior thigh. No improvement with NSAIDs and muscle relaxant. denies any change in GI/GU function or saddle paresthesia or back pain or groin swelling or testicular swelling.  Get hip x-ray today. May need groin [place] if normal x-ray. Will refer for PT if normal [place].      Relevant Orders   DG HIP UNILAT WITH PELVIS MIN 4 VIEWS RIGHT   DG HIP UNILAT W OR W/O PELVIS 2-3 VIEWS RIGHT   Other Visit Diagnoses     Colon cancer screening       Relevant Orders   Cologuard      Return if symptoms worsen or fail to improve.     Wilfred Lacy,  NP

## 2022-01-25 NOTE — Patient Instructions (Addendum)
Maintain appt with psychiatry Go for hip x-ray. May need groin [place] if normal x-ray. Will refer for PT if normal [place].

## 2022-01-25 NOTE — Assessment & Plan Note (Addendum)
Onset 10/2021, pain is constant, worse with weight bearing activity, denies any injury at onset of pain, has unsteady gait and right leg weakness due to pain, [ain radiates to anterior thigh. No improvement with NSAIDs and muscle relaxant. denies any change in GI/GU function or saddle paresthesia or back pain or groin swelling or testicular swelling.  Get hip x-ray today. May need groin [place] if normal x-ray. Will refer for PT if normal [place].

## 2022-01-25 NOTE — Assessment & Plan Note (Signed)
Unable to tolerate trintellix Has upcoming appt with psychiatry: 02/05/2022 No SI/Hi/hallucination at this time.

## 2022-01-25 NOTE — Progress Notes (Unsigned)
Rt anterior groin pain with radiating to right thigh. Denies injury

## 2022-01-26 NOTE — Addendum Note (Signed)
Addended by: Alysia Penna L on: 01/26/2022 10:49 AM   Modules accepted: Orders

## 2022-01-26 NOTE — Addendum Note (Signed)
Addended by: Alysia Penna L on: 01/26/2022 10:45 AM   Modules accepted: Orders

## 2022-01-30 ENCOUNTER — Ambulatory Visit (INDEPENDENT_AMBULATORY_CARE_PROVIDER_SITE_OTHER): Payer: Medicare HMO

## 2022-01-30 ENCOUNTER — Telehealth: Payer: Self-pay | Admitting: Nurse Practitioner

## 2022-01-30 DIAGNOSIS — J4550 Severe persistent asthma, uncomplicated: Secondary | ICD-10-CM | POA: Diagnosis not present

## 2022-01-30 NOTE — Telephone Encounter (Signed)
Pt called and stated that he wanted to know about his MRI. Can you please give pt a call back

## 2022-01-30 NOTE — Telephone Encounter (Signed)
Caller Name: Charan Prieto Call back phone #: [phone]  Reason for Call: Referral for MRI, pt has questions about this. Status shows as incomplete

## 2022-01-30 NOTE — Telephone Encounter (Signed)
Left VM, adv pt to call back to discuss  

## 2022-01-30 NOTE — Telephone Encounter (Signed)
Called & spoke w/ pt, says he needed to schedule his MRI appt and hasn't been contacted by anyone. Provided phone number to referral coordinator so he can get it scheduled.

## 2022-02-05 ENCOUNTER — Ambulatory Visit: Payer: 59 | Admitting: Internal Medicine

## 2022-02-06 ENCOUNTER — Ambulatory Visit: Payer: Medicare HMO | Admitting: Orthopaedic Surgery

## 2022-02-09 ENCOUNTER — Other Ambulatory Visit: Payer: Self-pay | Admitting: Nurse Practitioner

## 2022-02-09 DIAGNOSIS — F411 Generalized anxiety disorder: Secondary | ICD-10-CM

## 2022-02-15 ENCOUNTER — Telehealth: Payer: Self-pay | Admitting: Nurse Practitioner

## 2022-02-15 DIAGNOSIS — M167 Other unilateral secondary osteoarthritis of hip: Secondary | ICD-10-CM

## 2022-02-15 NOTE — Telephone Encounter (Signed)
Pt is complaining of pain in both leg, he says he can barely walk. He is waning something to help with the pain.   [id number] [address] [place], [address] AT [facility] [address]  [address] [place] [address]  Phone:  [phone]  Fax:  [phone]  DEA #:  [id number]

## 2022-02-16 ENCOUNTER — Encounter: Payer: Self-pay | Admitting: Nurse Practitioner

## 2022-02-16 MED ORDER — TRAMADOL-ACETAMINOPHEN 37.5-325 MG PO TABS: 1.0000 | ORAL_TABLET | Freq: Three times a day (TID) | ORAL | 0 refills | Status: AC | PRN

## 2022-02-16 NOTE — Addendum Note (Signed)
Addended by: Leana Gamer on: 02/16/2022 07:27 PM   Modules accepted: Orders

## 2022-02-17 ENCOUNTER — Other Ambulatory Visit: Payer: Medicare HMO

## 2022-02-20 ENCOUNTER — Ambulatory Visit: Payer: Medicare HMO | Admitting: Orthopaedic Surgery

## 2022-02-20 ENCOUNTER — Ambulatory Visit
Admission: RE | Admit: 2022-02-20 | Discharge: 2022-02-20 | Disposition: A | Discharge status: Home / Self Care | Payer: Medicare HMO | Source: Ambulatory Visit | Attending: Nurse Practitioner | Admitting: Nurse Practitioner

## 2022-02-20 ENCOUNTER — Other Ambulatory Visit: Payer: Self-pay

## 2022-02-20 DIAGNOSIS — R600 Localized edema: Secondary | ICD-10-CM | POA: Diagnosis not present

## 2022-02-20 DIAGNOSIS — R1031 Right lower quadrant pain: Secondary | ICD-10-CM

## 2022-02-20 DIAGNOSIS — M87051 Idiopathic aseptic necrosis of right femur: Secondary | ICD-10-CM | POA: Insufficient documentation

## 2022-02-20 DIAGNOSIS — M1611 Unilateral primary osteoarthritis, right hip: Secondary | ICD-10-CM

## 2022-02-20 DIAGNOSIS — M1612 Unilateral primary osteoarthritis, left hip: Secondary | ICD-10-CM | POA: Diagnosis not present

## 2022-02-20 DIAGNOSIS — M87052 Idiopathic aseptic necrosis of left femur: Secondary | ICD-10-CM | POA: Diagnosis not present

## 2022-02-20 MED ORDER — GADOBENATE DIMEGLUMINE 529 MG/ML IV SOLN
18.0000 mL | Freq: Once | INTRAVENOUS | Status: AC | PRN
  Administered 2022-02-20: 18 mL via INTRAVENOUS

## 2022-02-20 NOTE — Progress Notes (Signed)
Office Visit Note   Patient: Jordan [name]           Date of Birth: 1976-03-28           MRN: [id number] Visit Date: 02/20/2022              Requested by: Flossie Buffy, NP [place],  [place] [id number] PCP: Flossie Buffy, NP   Assessment & Plan: Visit Diagnoses:  1. Avascular necrosis of bone of right hip ([place])   2. Avascular necrosis of bone of left hip ([facility])     Plan: Impression is right greater than left avascular necrosis of the hips.  The right hip has subchondral collapse and remodeling of the femoral head as well as secondary degenerative joint disease and bone-on-bone joint space narrowing.  There is a large sclerotic lesion in the left femoral head without any collapse and the joint space is mildly decreased.  X-rays and MRI were reviewed with the patient detail and treatment options were reviewed.  We talked about the pros and cons of each treatment modality and based on his options he is elected for right total hip replacement soon as possible.  Risk benefits rehab recovery reviewed with the patient and his wife.  Questions encouraged and answered.  Debbie met with the patient today.  Follow-Up Instructions: No follow-ups on file.   Orders:  No orders of the defined types were placed in this encounter.  No orders of the defined types were placed in this encounter.     Procedures: No procedures performed   Clinical Data: No additional findings.   Subjective: Chief Complaint  Patient presents with   Right Hip - Pain    HPI Jordan [name] is a very pleasant 46 year old gentleman here for evaluation of bilateral hip pain worse on the right.  For few months he has had severe pain injuries.Marland Kitchen  His wife will accompany [place] by speaker phone. Reports thigh pain as well. Review of Systems  Constitutional: Negative.   All other systems reviewed and are negative.    Objective: Vital Signs: There were no vitals taken for this  visit.  Physical Exam Vitals and nursing note reviewed.  Constitutional:      Appearance: He is well-developed.  HENT:     Head: Normocephalic and atraumatic.  Eyes:     Pupils: Pupils are equal, round, and reactive to light.  Pulmonary:     Effort: Pulmonary effort is normal.  Abdominal:     Palpations: Abdomen is soft.  Musculoskeletal:        General: Normal range of motion.     Cervical back: Neck supple.  Skin:    General: Skin is warm.  Neurological:     Mental Status: He is alert and oriented to person, place, and time.  Psychiatric:        Behavior: Behavior normal.        Thought Content: Thought content normal.        Judgment: Judgment normal.     Ortho Exam Examination of right hip shows significant pain with hip flexion and internal rotation.  Antalgic gait.  No trochanteric tenderness.  No sciatic tension signs. Specialty Comments:  No specialty comments available.  Imaging: MR HIP LEFT W WO CONTRAST  Result Date: 02/20/2022 CLINICAL DATA:  Hip pain, osteonecrosis suspected, xray done EXAM: MRI OF THE LEFT AND RIGHT HIP WITHOUT AND WITH CONTRAST TECHNIQUE: Multiplanar, multisequence MR imaging was performed both before and after  administration of intravenous contrast. CONTRAST:  52m MULTIHANCE GADOBENATE DIMEGLUMINE 529 MG/ML IV SOLN COMPARISON:  Radiograph 01/25/2022. FINDINGS: Bones: There is avascular necrosis of the femoral heads bilaterally, with greater than 50% involvement of the femoral heads, right greater than left. There is articular surface collapse anteriorly on the right. No articular surface collapse on the left. There is surrounding marrow edema, right greater than left. The sacroiliac joints and pubic symphysis are unremarkable. Articular cartilage and labrum Articular cartilage: There is severe right and moderate left hip chondrosis. Labrum: Degenerative superior labral tearing anteriorly and posteriorly on the right. Degenerative anterior superior  labral tearing on the left. Joint or bursal effusion Joint effusion: Moderate right and small left hip joint effusions with expected thin synovial enhancement. Bursae: No evidence of trochanteric bursitis. Muscles and tendons Muscles and tendons: The gluteal tendons are intact. The proximal hamstrings are intact.The adductors are intact. There is intramuscular edema within the iliacus component of the right iliopsoas muscle coursing down the right groin towards the lesser trochanter and with adjacent reactive mild perifascial edema inferiorly. Other findings Miscellaneous: The visualized internal pelvic contents appear unremarkable. IMPRESSION: Avascular necrosis of the femoral heads, right worse than left, with articular surface collapse on the right. 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 Moderate right and small left hip joint effusions. Intramuscular edema within the iliacus component of the right iliopsoas muscle, likely reflecting low-grade muscle strain. Electronically Signed   By: JMaurine SimmeringM.D.   On: 02/20/2022 13:53   MR HIP RIGHT W WO CONTRAST  Result Date: 02/20/2022 CLINICAL DATA:  Hip pain, osteonecrosis suspected, xray done EXAM: MRI OF THE LEFT AND RIGHT HIP WITHOUT AND WITH CONTRAST TECHNIQUE: Multiplanar, multisequence MR imaging was performed both before and after administration of intravenous contrast. CONTRAST:  177mMULTIHANCE GADOBENATE DIMEGLUMINE 529 MG/ML IV SOLN COMPARISON:  Radiograph 01/25/2022. FINDINGS: Bones: There is avascular necrosis of the femoral heads bilaterally, with greater than 50% involvement of the femoral heads, right greater than left. There is articular surface collapse anteriorly on the right. No articular surface collapse on the left. There is surrounding marrow edema, right greater than left. The sacroiliac joints and pubic symphysis are unremarkable. Articular cartilage and labrum Articular cartilage:  There is severe right and moderate left hip chondrosis. Labrum: Degenerative superior labral tearing anteriorly and posteriorly on the right. Degenerative anterior superior labral tearing on the left. Joint or bursal effusion Joint effusion: Moderate right and small left hip joint effusions with expected thin synovial enhancement. Bursae: No evidence of trochanteric bursitis. Muscles and tendons Muscles and tendons: The gluteal tendons are intact. The proximal hamstrings are intact.The adductors are intact. There is intramuscular edema within the iliacus component of the right iliopsoas muscle coursing down the right groin towards the lesser trochanter and with adjacent reactive mild perifascial edema inferiorly. Other findings Miscellaneous: The visualized internal pelvic contents appear unremarkable. IMPRESSION: Avascular necrosis of the femoral heads, right worse than left, with articular surface collapse on the right. 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 Moderate right and small left hip joint effusions. Intramuscular edema within the iliacus component of the right iliopsoas muscle, likely reflecting low-grade muscle strain. Electronically Signed   By: JaMaurine Simmering.D.   On: 02/20/2022 13:53     PMFS History: Patient Active Problem List   Diagnosis Date Noted   Avascular necrosis of bone of right hip ([facility]/26/2023   Avascular necrosis of bone  of left hip ([place]) 02/20/2022   Right groin pain 01/25/2022   Low testosterone in male 09/04/2021   Erectile dysfunction 11/02/2020   Depression, recurrent ([place]) 04/01/2020   Elevated LDL cholesterol level 07/29/2019   SOB (shortness of breath) 07/27/2019   Seasonal and perennial allergic rhinoconjunctivitis 07/20/2019   Asthma 07/20/2019   Heartburn 07/20/2019   Stress and adjustment reaction 07/14/2019   Hypertension 07/14/2019   Past Medical History:  Diagnosis Date   Asthma    Elevated  LFTs 07/29/2019   Reflux esophagitis     Family History  Problem Relation Age of Onset   Hypertension Mother    Glaucoma Mother 30   Hypertension Father     Past Surgical History:  Procedure Laterality Date   NO PAST SURGERIES     Social History   Occupational History   Not on file  Tobacco Use   Smoking status: Never   Smokeless tobacco: Never  Vaping Use   Vaping Use: Never used  Substance and Sexual Activity   Alcohol use: Yes    Comment: occ   Drug use: No   Sexual activity: Yes    Birth control/protection: None

## 2022-02-21 ENCOUNTER — Other Ambulatory Visit: Payer: Self-pay | Admitting: Physician Assistant

## 2022-02-21 ENCOUNTER — Telehealth: Payer: Self-pay | Admitting: Orthopaedic Surgery

## 2022-02-21 ENCOUNTER — Ambulatory Visit (HOSPITAL_COMMUNITY): Payer: Medicare HMO | Admitting: Psychiatry

## 2022-02-21 MED ORDER — TRAMADOL HCL 50 MG PO TABS: 50.0000 mg | ORAL_TABLET | Freq: Two times a day (BID) | ORAL | 0 refills | Status: DC | PRN

## 2022-02-21 NOTE — Telephone Encounter (Signed)
Sent in tramadol

## 2022-02-21 NOTE — Telephone Encounter (Signed)
Patient wants to know if any pain meds can be called in for him [phone]

## 2022-02-22 NOTE — Telephone Encounter (Signed)
Spoke with patient and notified him that tramadol has been sent to his pharmacy. He is scheduled for surgery on Oct 30th. He wants to know the visitor policy. How many people can come to the hospital? Can his son who is 71yr old stay with him overnight at the hospital?  I explained that the policy may change in the next month from what it is now, being that flu and covid season are here. I'll call him back to let him know.

## 2022-02-22 NOTE — Telephone Encounter (Signed)
Called patient. Advised him to contact the hospital for the most recent visitor policy.

## 2022-02-26 ENCOUNTER — Other Ambulatory Visit: Payer: Self-pay | Admitting: *Deleted

## 2022-02-26 MED ORDER — NUCALA 100 MG/ML ~~LOC~~ SOAJ: 100.0000 mg | SUBCUTANEOUS | 11 refills | Status: DC

## 2022-02-26 NOTE — Progress Notes (Signed)
Patient advised of approval and submit for Nucala AJ to [employer] due to new Ins . Patient still wants to get injections in clinic

## 2022-02-27 ENCOUNTER — Ambulatory Visit: Payer: Medicaid Other

## 2022-02-28 ENCOUNTER — Other Ambulatory Visit: Payer: Self-pay | Admitting: Nurse Practitioner

## 2022-02-28 DIAGNOSIS — F411 Generalized anxiety disorder: Secondary | ICD-10-CM

## 2022-03-02 ENCOUNTER — Telehealth: Payer: Self-pay | Admitting: Orthopaedic Surgery

## 2022-03-08 ENCOUNTER — Other Ambulatory Visit: Payer: Self-pay | Admitting: *Deleted

## 2022-03-08 ENCOUNTER — Telehealth: Payer: Self-pay | Admitting: Nurse Practitioner

## 2022-03-08 MED ORDER — NUCALA 100 MG/ML ~~LOC~~ SOAJ: 100.0000 mg | SUBCUTANEOUS | 11 refills | Status: DC

## 2022-03-08 NOTE — Telephone Encounter (Signed)
Caller Name: Tae Vonada Ph #: [phone] Chief Complaint: pt called stating he believes he took 2 doses of Trintellix.   This call was transferred to Nurse Triage/Access Nurse. This is for documentation purposes. No follow up required at this time.

## 2022-03-12 ENCOUNTER — Telehealth: Payer: Self-pay | Admitting: Orthopaedic Surgery

## 2022-03-14 ENCOUNTER — Telehealth: Payer: Medicare HMO | Admitting: Nurse Practitioner

## 2022-03-14 DIAGNOSIS — L299 Pruritus, unspecified: Secondary | ICD-10-CM | POA: Diagnosis not present

## 2022-03-14 MED ORDER — PREDNISONE 10 MG (21) PO TBPK: ORAL_TABLET | ORAL | 0 refills | Status: DC

## 2022-03-14 NOTE — Progress Notes (Signed)
Virtual Visit Consent   Jordan [name], you are scheduled for a virtual visit with Jordan [name], [place], a [facility] provider, today.     Just as with appointments in the office, your consent must be obtained to participate.  Your consent will be active for this visit and any virtual visit you may have with one of our providers in the next 365 days.     If you have a MyChart account, a copy of this consent can be sent to you electronically.  All virtual visits are billed to your insurance company just like a traditional visit in the office.    As this is a virtual visit, video technology does not allow for your provider to perform a traditional examination.  This may limit your provider's ability to fully assess your condition.  If your provider identifies any concerns that need to be evaluated in person or the need to arrange testing (such as labs, EKG, etc.), we will make arrangements to do so.     Although advances in technology are sophisticated, we cannot ensure that it will always work on either your end or our end.  If the [name] with a video visit is poor, the visit may have to be switched to a telephone visit.  With either a video or telephone visit, we are not always able to ensure that we have a secure [name].     I need to obtain your verbal consent now.   Are you willing to proceed with your visit today? YES   Jordan [name] has provided verbal consent on 03/14/2022 for a virtual visit (video or telephone).  * Jordan [name]. Jordan Hassell Done, FNP   Date: 03/14/2022 4:02 PM   Virtual Visit via Video Note   I, Jordan [name], connected with Jordan [name] ([id number], 46) on 03/14/22 at  4:00 PM EDT by a video-enabled telemedicine application and verified that I am speaking with the correct person using two identifiers.  Location: Patient: Virtual Visit Location Patient: Home Provider: Virtual Visit Location Provider:  Mobile   I discussed the limitations of evaluation and management by telemedicine and the availability of in person appointments. The patient expressed understanding and agreed to proceed.    History of Present Illness: Jordan [name] is a 46 y.o. who identifies as a male who was assigned male at birth, and is being seen today for itching.  HPI: Patient says he has been itching. Starts around his ankles. When he scratches he gets little bumps where he was scratching. Once he starts scratching he cant quit. Started a couple of weeks ago and is getting worse daioy. He tries putting lotion or Vaseline on it and that helps a little. The itching can last 30-40 minutes. No rash until he scratches. Denies any new lotions , detergents or soaps. No new food r drinks. He has taken benadryl and that helps when he is ready to go to bed.    Review of Systems  Skin:  Positive for itching. Negative for rash.    Problems:  Patient Active Problem List   Diagnosis Date Noted   Avascular necrosis of bone of right hip ([facility]) 02/20/2022   Avascular necrosis of bone of left hip ([place]) 02/20/2022   Right groin pain 01/25/2022   Low testosterone in male 09/04/2021   Erectile dysfunction 11/02/2020   Depression, recurrent ([facility]) 04/01/2020   Elevated LDL cholesterol level 07/29/2019   SOB (shortness of  breath) 07/27/2019   Seasonal and perennial allergic rhinoconjunctivitis 07/20/2019   Asthma 07/20/2019   Heartburn 07/20/2019   Stress and adjustment reaction 07/14/2019   Hypertension 07/14/2019    Allergies: No Known Allergies Medications:  Current Outpatient Medications:    albuterol (PROVENTIL) (2.5 MG/3ML) 0.083% nebulizer solution, Take 3 mLs (2.5 mg total) by nebulization every 4 (four) hours as needed for wheezing or shortness of breath., Disp: 75 mL, Rfl: 1   albuterol (VENTOLIN HFA) 108 (90 Base) MCG/ACT inhaler, Inhale 2 puffs into the lungs every 6 (six) hours as needed for wheezing or shortness  of breath., Disp: 8 g, Rfl: 0   amLODipine (NORVASC) 10 MG tablet, Take 1 tablet (10 mg total) by mouth daily., Disp: 90 tablet, Rfl: 3   azelastine (ASTELIN) 0.1 % nasal spray, 2 sprays per nostril 2 times daily as needed for drainage., Disp: 30 mL, Rfl: 5   beclomethasone (QVAR REDIHALER) 80 MCG/ACT inhaler, Inhale 2 puffs into the lungs 2 (two) times daily. Rinse mouth out after each use. (Patient not taking: Reported on 03/13/2022), Disp: 10.6 g, Rfl: 5   Budeson-Glycopyrrol-Formoterol (BREZTRI AEROSPHERE) 160-9-4.8 MCG/ACT AERO, Inhale 2 puffs into the lungs in the morning and at bedtime., Disp: 10.7 g, Rfl: 5   EPINEPHrine 0.3 mg/0.3 mL IJ SOAJ injection, INJECT 0.3 MLS INTO MUSCLE, Disp: 1 each, Rfl: 2   fenofibrate (TRICOR) 145 MG tablet, TAKE 1 TABLET(145 MG) BY MOUTH DAILY, Disp: 90 tablet, Rfl: 1   ibuprofen (ADVIL) 200 MG tablet, Take 400 mg by mouth every 6 (six) hours as needed for moderate pain., Disp: , Rfl:    ipratropium (ATROVENT) 0.06 % nasal spray, Place 2 sprays in each nostril up to three times a day as needed, Disp: 15 mL, Rfl: 5   losartan (COZAAR) 25 MG tablet, Take 1 tablet (25 mg total) by mouth daily., Disp: 90 tablet, Rfl: 3   Mepolizumab (NUCALA) 100 MG/ML SOAJ, Inject 1 mL (100 mg total) into the skin every 28 (twenty-eight) days., Disp: 1 mL, Rfl: 11   montelukast (SINGULAIR) 10 MG tablet, TAKE 1 TABLET BY MOUTH EVERYDAY AT BEDTIME, Disp: 30 tablet, Rfl: 5   ondansetron (ZOFRAN) 4 MG tablet, Take 1 tablet (4 mg total) by mouth every 8 (eight) hours as needed for nausea or vomiting., Disp: 21 tablet, Rfl: 0   pantoprazole (PROTONIX) 20 MG tablet, Take 1 tablet (20 mg total) by mouth 2 (two) times daily., Disp: 60 tablet, Rfl: 3   traMADol (ULTRAM) 50 MG tablet, Take 1 tablet (50 mg total) by mouth 2 (two) times daily as needed., Disp: 30 tablet, Rfl: 0   trolamine salicylate (ASPERCREME) 10 % cream, Apply 1 Application topically as needed for muscle pain., Disp: , Rfl:     vortioxetine HBr (TRINTELLIX) 10 MG TABS tablet, Take 1 tablet (10 mg total) by mouth daily., Disp: 30 tablet, Rfl: 5  Current Facility-Administered Medications:    Mepolizumab SOLR 100 mg, 100 mg, Subcutaneous, Q28 days, Kennith Gain, MD, 100 mg at 01/30/22 1743  Observations/Objective: Patient is well-developed, well-nourished in no acute distress.  Resting comfortably  at home.  Head is normocephalic, atraumatic.  No labored breathing.  Speech is clear and coherent with logical content.  Patient is alert and oriented at baseline.  Video disconnected  Assessment and Plan:  Jordan [name] in today with chief complaint of itching   1. Pruritus Avoid scratching Cool compresses Meds ordered this encounter  Medications   predniSONE (STERAPRED  UNI-PAK 21 TAB) 10 MG (21) TBPK tablet    Sig: As directed x 6 days    Dispense:  21 tablet    Refill:  0    Order Specific Question:   Supervising Provider    Answer:   Chase Picket [id number]   If steroids do not help- will need a face to face visit.     Follow Up Instructions: I discussed the assessment and treatment plan with the patient. The patient was provided an opportunity to ask questions and all were answered. The patient agreed with the plan and demonstrated an understanding of the instructions.  A copy of instructions were sent to the patient via MyChart.  The patient was advised to call back or seek an in-person evaluation if the symptoms worsen or if the condition fails to improve as anticipated.  Time:  I spent 10 minutes with the patient via telehealth technology discussing the above problems/concerns.    Jordan Hassell Done, FNP

## 2022-03-14 NOTE — Patient Instructions (Signed)
SARNA LOTION  Pruritus Pruritus is an itchy feeling on the skin. One of the most common causes is dry skin, but many different things can cause itching. Most cases of itching do not require medical attention. Sometimes itchy skin can turn into a rash or a secondary infection. Follow these instructions at home: Skin care  Do not use scented soaps, detergents, perfumes, and cosmetic products. Instead, use gentle, unscented versions of these items. Apply moisturizing creams to your skin frequently, at least twice daily. Apply immediately after bathing while skin is still wet. Take medicines or apply medicated creams only as told by your health care provider. This may include: Corticosteroid cream or topical calcineurin inhibitor. Anti-itch lotions containing urea, camphor, or menthol. Oral antihistamines. Do not take hot showers or baths, which can make itching worse. A short, cool shower may help with itching as long as you apply moisturizing lotion after the shower. Apply a cool, wet cloth (cool compress) to the affected areas. You may take lukewarm baths with one of the following: Epsom salts. You can get these at your local pharmacy or grocery store. Follow the instructions on the packaging. Baking soda. Pour a small amount into the bath as told by your health care provider. Colloidal oatmeal. You can get this at your local pharmacy or grocery store. Follow the instructions on the packaging. Do not scratch your skin. General instructions Avoid wearing tight clothes. Keep a journal to help find out what is causing your itching. Write down: What you eat and drink. What cosmetic products you use. What soaps or detergents you use. What you wear, including jewelry. Use a humidifier. This keeps the air moist, which helps to prevent dry skin. Be aware of any changes in your itchiness. Tell your health care provider about any changes. Contact a health care provider if: The itching does not go  away after several days. You notice redness, warmth, or drainage on the skin where you have scratched. You are unusually thirsty or urinating more than normal. Your skin tingles or feels numb. Your skin or the white parts of your eyes turn yellow (jaundice). You feel weak. You have any of the following: Night sweats. Tiredness (fatigue). Weight loss. Abdominal pain. Summary Pruritus is an itchy feeling on the skin. One of the most common causes is dry skin, but many different conditions and factors can cause itching. Apply moisturizing creams to your skin frequently, at least twice daily. Apply immediately after bathing while skin is still wet. Take medicines or apply medicated creams only as told by your health care provider. Do not take hot showers or baths. Do not use scented soaps, detergents, perfumes, or cosmetic products. Keep a journal to help find out what is causing your itching. This information is not intended to replace advice given to you by your health care provider. Make sure you discuss any questions you have with your health care provider. Document Revised: 06/21/2021 Document Reviewed: 06/21/2021 Elsevier Patient Education  2023 [employer]  

## 2022-03-15 NOTE — Pre-Procedure Instructions (Signed)
Surgical Instructions    Your procedure is scheduled on March 26, 2022.  Report to [facility] Main Entrance "A" at 6:15 A.M., then check in with the Admitting office.  Call this number if you have problems the morning of surgery:  [phone]   If you have any questions prior to your surgery date call [phone]: Open Monday-Friday 8am-4pm    Remember:  Do not eat after midnight the night before your surgery  You may drink clear liquids until 5:45 AM the morning of your surgery.   Clear liquids allowed are: Water, Non-Citrus Juices (without pulp), Carbonated Beverages, Clear Tea, Black Coffee Only (NO MILK, CREAM OR POWDERED CREAMER of any kind), and Gatorade.  Patient Instructions  The night before surgery:  No food after midnight. ONLY clear liquids after midnight  The day of surgery (if you do NOT have diabetes):  Drink ONE (1) Pre-Surgery Clear Ensure by 5:45 AM the morning of surgery. Drink in one sitting. Do not sip.  This drink was given to you during your hospital  pre-op appointment visit.  Nothing else to drink after completing the  Pre-Surgery Clear Ensure.         If you have questions, please contact your surgeon's office.      Take these medicines the morning of surgery with A SIP OF WATER:  amLODipine (NORVASC)   Budeson-Glycopyrrol-Formoterol (BREZTRI AEROSPHERE)   fenofibrate (TRICOR)  pantoprazole (PROTONIX)   vortioxetine HBr (TRINTELLIX)     Take these medicines the morning of surgery with a sip of water AS NEEDED:  albuterol (PROVENTIL) nebulizer solution  albuterol (VENTOLIN HFA) inhaler  azelastine (ASTELIN) nasal spray  ipratropium (ATROVENT) nasal spray  ondansetron (ZOFRAN)   traMADol (ULTRAM)    As of today, STOP taking any Aspirin (unless otherwise instructed by your surgeon) Aleve, Naproxen, Ibuprofen, Motrin, Advil, Goody's, BC's, all herbal medications, fish oil, and all vitamins. This includes your medication: trolamine  salicylate (ASPERCREME)                      Do NOT Smoke (Tobacco/Vaping) for 24 hours prior to your procedure.  If you use a CPAP at night, you may bring your mask/headgear for your overnight stay.   Contacts, glasses, piercing's, hearing aid's, dentures or partials may not be worn into surgery, please bring cases for these belongings.    For patients admitted to the hospital, discharge time will be determined by your treatment team.   Patients discharged the day of surgery will not be allowed to drive home, and someone needs to stay with them for 24 hours.  SURGICAL WAITING ROOM VISITATION Patients having surgery or a procedure may have no more than 2 support people in the waiting area - these visitors may rotate.   Children under the age of 44 must have an adult with them who is not the patient. If the patient needs to stay at the hospital during part of their recovery, the visitor guidelines for inpatient rooms apply. Pre-op nurse will coordinate an appropriate time for 1 support person to accompany patient in pre-op.  This support person may not rotate.   Please refer to the [facility] website for the visitor guidelines for Inpatients (after your surgery is over and you are in a regular room).    Special instructions:   [place]- Preparing For Surgery  Before surgery, you can play an important role. Because skin is not sterile, your skin needs to be as free of germs  as possible. You can reduce the number of germs on your skin by washing with CHG (chlorahexidine gluconate) Soap before surgery.  CHG is an antiseptic cleaner which kills germs and bonds with the skin to continue killing germs even after washing.    Oral Hygiene is also important to reduce your risk of infection.  Remember - BRUSH YOUR TEETH THE MORNING OF SURGERY WITH YOUR REGULAR TOOTHPASTE  Please do not use if you have an allergy to CHG or antibacterial soaps. If your skin becomes reddened/irritated stop using  the CHG.  Do not shave (including legs and underarms) for at least 48 hours prior to first CHG shower. It is OK to shave your face.  Please follow these instructions carefully.   Shower the NIGHT BEFORE SURGERY and the MORNING OF SURGERY  If you chose to wash your hair, wash your hair first as usual with your normal shampoo.  After you shampoo, rinse your hair and body thoroughly to remove the shampoo.  Use CHG Soap as you would any other liquid soap. You can apply CHG directly to the skin and wash gently with a scrungie or a clean washcloth.   Apply the CHG Soap to your body ONLY FROM THE NECK DOWN.  Do not use on open wounds or open sores. Avoid contact with your eyes, ears, mouth and genitals (private parts). Wash Face and genitals (private parts)  with your normal soap.   Wash thoroughly, paying special attention to the area where your surgery will be performed.  Thoroughly rinse your body with warm water from the neck down.  DO NOT shower/wash with your normal soap after using and rinsing off the CHG Soap.  Pat yourself dry with a CLEAN TOWEL.  Wear CLEAN PAJAMAS to bed the night before surgery  Place CLEAN SHEETS on your bed the night before your surgery  DO NOT SLEEP WITH PETS.   Day of Surgery: Take a shower with CHG soap. Do not wear jewelry or makeup Do not wear lotions, powders, perfumes/colognes, or deodorant. Do not shave 48 hours prior to surgery.  Men may shave face and neck. Do not bring valuables to the hospital.  [facility] is not responsible for any belongings or valuables. Do not wear nail polish, gel polish, artificial nails, or any other type of covering on natural nails (fingers and toes) If you have artificial nails or gel coating that need to be removed by a nail salon, please have this removed prior to surgery. Artificial nails or gel coating may interfere with anesthesia's ability to adequately monitor your vital signs.  Wear Clean/Comfortable  clothing the morning of surgery Remember to brush your teeth WITH YOUR REGULAR TOOTHPASTE.   Please read over the following fact sheets that you were given.    If you received a COVID test during your pre-op visit  it is requested that you wear a mask when out in public, stay away from anyone that may not be feeling well and notify your surgeon if you develop symptoms. If you have been in contact with anyone that has tested positive in the last 10 days please notify you surgeon.

## 2022-03-16 ENCOUNTER — Telehealth: Payer: Medicare HMO | Admitting: Emergency Medicine

## 2022-03-16 ENCOUNTER — Encounter (HOSPITAL_COMMUNITY): Payer: Self-pay

## 2022-03-16 ENCOUNTER — Encounter (HOSPITAL_COMMUNITY)
Admission: RE | Admit: 2022-03-16 | Discharge: 2022-03-16 | Disposition: A | Discharge status: Home / Self Care | Payer: Medicare HMO | Source: Ambulatory Visit | Attending: Orthopaedic Surgery | Admitting: Orthopaedic Surgery

## 2022-03-16 ENCOUNTER — Other Ambulatory Visit: Payer: Self-pay

## 2022-03-16 VITALS — BP 125/91 | HR 99 | Temp 98.4°F | Resp 17 | Ht 69.0 in | Wt 186.4 lb

## 2022-03-16 DIAGNOSIS — R9431 Abnormal electrocardiogram [ECG] [EKG]: Secondary | ICD-10-CM | POA: Insufficient documentation

## 2022-03-16 DIAGNOSIS — I10 Essential (primary) hypertension: Secondary | ICD-10-CM | POA: Diagnosis not present

## 2022-03-16 DIAGNOSIS — K047 Periapical abscess without sinus: Secondary | ICD-10-CM

## 2022-03-16 DIAGNOSIS — M87051 Idiopathic aseptic necrosis of right femur: Secondary | ICD-10-CM | POA: Diagnosis not present

## 2022-03-16 DIAGNOSIS — Z01818 Encounter for other preprocedural examination: Secondary | ICD-10-CM

## 2022-03-16 HISTORY — DX: Essential (primary) hypertension: I10

## 2022-03-16 HISTORY — DX: Depression, unspecified: F32.A

## 2022-03-16 HISTORY — DX: Bipolar disorder, unspecified: F31.9

## 2022-03-16 HISTORY — DX: Anxiety disorder, unspecified: F41.9

## 2022-03-16 HISTORY — DX: Gastro-esophageal reflux disease without esophagitis: K21.9

## 2022-03-16 LAB — COMPREHENSIVE METABOLIC PANEL
ALT: 20 U/L (ref 0–44)
AST: 22 U/L (ref 15–41)
Albumin: 3.8 g/dL (ref 3.5–5.0)
Alkaline Phosphatase: 59 U/L (ref 38–126)
Anion gap: 10 (ref 5–15)
BUN: 17 mg/dL (ref 6–20)
CO2: 25 mmol/L (ref 22–32)
Calcium: 9.3 mg/dL (ref 8.9–10.3)
Chloride: 104 mmol/L (ref 98–111)
Creatinine, Ser: 1.05 mg/dL (ref 0.61–1.24)
GFR, Estimated: >60 mL/min (ref >60)
Glucose, Bld: 104 mg/dL — ABNORMAL HIGH (ref 70–99)
Potassium: 4.2 mmol/L (ref 3.5–5.1)
Sodium: 139 mmol/L (ref 135–145)
Total Bilirubin: 0.4 mg/dL (ref 0.3–1.2)
Total Protein: 6.5 g/dL (ref 6.5–8.1)

## 2022-03-16 LAB — TYPE AND SCREEN
ABO/RH(D): O POS
Antibody Screen: NEGATIVE

## 2022-03-16 LAB — CBC
HCT: 41.7 % (ref 39.0–52.0)
Hemoglobin: 13.5 g/dL (ref 13.0–17.0)
MCH: 29.2 pg (ref 26.0–34.0)
MCHC: 32.4 g/dL (ref 30.0–36.0)
MCV: 90.1 fL (ref 80.0–100.0)
Platelets: 364 10*3/uL (ref 150–400)
RBC: 4.63 MIL/uL (ref 4.22–5.81)
RDW: 12.8 % (ref 11.5–15.5)
WBC: 5.1 10*3/uL (ref 4.0–10.5)
nRBC: 0 % (ref 0.0–0.2)

## 2022-03-16 LAB — SURGICAL PCR SCREEN
MRSA, PCR: NEGATIVE
Staphylococcus aureus: NEGATIVE

## 2022-03-16 MED ORDER — PENICILLIN V POTASSIUM 500 MG PO TABS: 500.0000 mg | ORAL_TABLET | Freq: Three times a day (TID) | ORAL | 0 refills | Status: AC

## 2022-03-16 NOTE — Progress Notes (Signed)
PCP - Wilfred Lacy, NP Cardiologist - denies  PPM/ICD - denies   Chest x-ray - 07/31/20 EKG - 03/16/22 Stress Test - denies ECHO - denies Cardiac Cath - denies  Sleep Study - denies  DM- denies  Last dose of GLP1 agonist-  n/a   ASA/Blood Thinner Instructions: n/a   ERAS Protcol - yes PRE-SURGERY Ensure given at PAT  COVID TEST- n/a   Anesthesia review: no  Patient denies shortness of breath, fever, cough and chest pain at PAT appointment   All instructions explained to the patient, with a verbal understanding of the material. Patient agrees to go over the instructions while at home for a better understanding. The opportunity to ask questions was provided.

## 2022-03-16 NOTE — Progress Notes (Signed)
E-Visit for Dental Pain  **Please let your surgeon for your upcoming hip surgery know you have a dental infection**  We are sorry that you are not feeling well.  Here is how we plan to help!  Based on what you have shared with me in the questionnaire, it sounds like you have  a dental abscess I have prescribed PCN 500mg  3 times per day for 10 days  It is imperative that you see a dentist within 10 days of this eVisit to determine the cause of the dental pain and be sure it is adequately treated  A toothache or tooth pain is caused when the nerve in the root of a tooth or surrounding a tooth is irritated. Dental (tooth) infection, decay, injury, or loss of a tooth are the most common causes of dental pain. Pain may also occur after an extraction (tooth is pulled out). Pain sometimes originates from other areas and radiates to the jaw, thus appearing to be tooth pain.Bacteria growing inside your mouth can contribute to gum disease and dental decay, both of which can cause pain. A toothache occurs from inflammation of the central portion of the tooth called pulp. The pulp contains nerve endings that are very sensitive to pain. Inflammation to the pulp or pulpitis may be caused by dental cavities, trauma, and infection.    HOME CARE:   For toothaches: Over-the-counter pain medications such as acetaminophen or ibuprofen may be used. Take these as directed on the package while you arrange for a dental appointment. Avoid very cold or hot foods, because they may make the pain worse. You may get relief from biting on a cotton ball soaked in oil of cloves. You can get oil of cloves at most drug stores.  For jaw pain:  Aspirin may be helpful for problems in the joint of the jaw in adults. If pain happens every time you open your mouth widely, the temporomandibular joint (TMJ) may be the source of the pain. Yawning or taking a large bite of food may worsen the pain. An appointment with your doctor or  dentist will help you find the cause.     GET HELP RIGHT AWAY IF:  You have a high fever or chills If you have had a recent head or face injury and develop headache, light headedness, nausea, vomiting, or other symptoms that concern you after an injury to your face or mouth, you could have a more serious injury in addition to your dental injury. A facial rash associated with a toothache: This condition may improve with medication. Contact your doctor for them to decide what is appropriate. Any jaw pain occurring with chest pain: Although jaw pain is most commonly caused by dental disease, it is sometimes referred pain from other areas. People with heart disease, especially people who have had stents placed, people with diabetes, or those who have had heart surgery may have jaw pain as a symptom of heart attack or angina. If your jaw or tooth pain is associated with lightheadedness, sweating, or shortness of breath, you should see a doctor as soon as possible. Trouble swallowing or excessive pain or bleeding from gums: If you have a history of a weakened immune system, diabetes, or steroid use, you may be more susceptible to infections. Infections can often be more severe and extensive or caused by unusual organisms. Dental and gum infections in people with these conditions may require more aggressive treatment. An abscess may need draining or IV antibiotics, for example.  MAKE SURE YOU   Understand these instructions. Will watch your condition. Will get help right away if you are not doing well or get worse.  Thank you for choosing an e-visit.  Your e-visit answers were reviewed by a board certified advanced clinical practitioner to complete your personal care plan. Depending upon the condition, your plan could have included both over the counter or prescription medications.  Please review your pharmacy choice. Make sure the pharmacy is open so you can pick up prescription now. If there is a  problem, you may contact your provider through [employer] and have the prescription routed to another pharmacy.  Your safety is important to [place]. If you have drug allergies check your prescription carefully.   For the next 24 hours you can use MyChart to ask questions about today's visit, request a non-urgent call back, or ask for a work or school excuse. You will get an email in the next two days asking about your experience. I hope that your e-visit has been valuable and will speed your recovery.  I have spent 5 minutes in review of e-visit questionnaire, review and updating patient chart, medical decision making and response to patient.   Willeen Cass, PhD, FNP-BC

## 2022-03-19 ENCOUNTER — Ambulatory Visit: Payer: Medicare HMO

## 2022-03-19 ENCOUNTER — Other Ambulatory Visit: Payer: Self-pay | Admitting: Physician Assistant

## 2022-03-19 MED ORDER — METHOCARBAMOL 750 MG PO TABS: 750.0000 mg | ORAL_TABLET | Freq: Two times a day (BID) | ORAL | 2 refills | Status: DC | PRN

## 2022-03-19 MED ORDER — DOCUSATE SODIUM 100 MG PO CAPS: 100.0000 mg | ORAL_CAPSULE | Freq: Every day | ORAL | 2 refills | Status: AC | PRN

## 2022-03-19 MED ORDER — OXYCODONE-ACETAMINOPHEN 5-325 MG PO TABS: 1.0000 | ORAL_TABLET | Freq: Four times a day (QID) | ORAL | 0 refills | Status: DC | PRN

## 2022-03-19 MED ORDER — ONDANSETRON HCL 4 MG PO TABS: 4.0000 mg | ORAL_TABLET | Freq: Three times a day (TID) | ORAL | 0 refills | Status: AC | PRN

## 2022-03-19 MED ORDER — ASPIRIN 81 MG PO TBEC: 81.0000 mg | DELAYED_RELEASE_TABLET | Freq: Two times a day (BID) | ORAL | 0 refills | Status: AC

## 2022-03-20 ENCOUNTER — Telehealth: Payer: Self-pay | Admitting: *Deleted

## 2022-03-20 NOTE — Telephone Encounter (Signed)
-----   Message from Herbie Drape, LPN sent at [phone]  2:53 PM EDT ----- Patient was on schedule to get Nucala however there was no medication. Patient would like a call from you regarding this and when his medication will be delivered.

## 2022-03-20 NOTE — Telephone Encounter (Signed)
Called patient and asked if he had spoken to [facility] new pharmacy due to Ins change to give ok to ship and he advised he had not. Phone number given to patient to contact pharmacy

## 2022-03-21 NOTE — Telephone Encounter (Signed)
agreed

## 2022-03-21 NOTE — Telephone Encounter (Signed)
Patient has called in with questions about surgery and would like a call back please advise

## 2022-03-21 NOTE — Telephone Encounter (Signed)
Sounds like we need to reschedule the surgery given presumed tooth abscess.  It would definitely need to be fully treated released by dentist before we can put him back on the schedule.

## 2022-03-22 ENCOUNTER — Telehealth: Payer: Self-pay | Admitting: Orthopaedic Surgery

## 2022-03-22 NOTE — Telephone Encounter (Signed)
Pt would like a call back today he has questions before his surgery on Monday [phone]

## 2022-03-22 NOTE — Telephone Encounter (Signed)
He is.  Debbie said she would call him to let him know.  I'll call him.

## 2022-03-22 NOTE — Telephone Encounter (Signed)
Spoke to patient tonight about cancelling surgery.  He understands everything.  He sees his dentist in a week or so.  He wants to come by the office to get a pair of crutches tomorrow.

## 2022-03-23 ENCOUNTER — Encounter: Payer: Self-pay | Admitting: Physician Assistant

## 2022-03-23 ENCOUNTER — Telehealth: Payer: Medicare HMO | Admitting: Physician Assistant

## 2022-03-23 DIAGNOSIS — M25562 Pain in left knee: Secondary | ICD-10-CM | POA: Diagnosis not present

## 2022-03-23 MED ORDER — MELOXICAM 15 MG PO TABS: 15.0000 mg | ORAL_TABLET | Freq: Every day | ORAL | 0 refills | Status: DC

## 2022-03-23 NOTE — Progress Notes (Signed)
Virtual Visit Consent   [name], you are scheduled for a virtual visit with a [facility] provider today. Just as with appointments in the office, your consent must be obtained to participate. Your consent will be active for this visit and any virtual visit you may have with one of our providers in the next 365 days. If you have a MyChart account, a copy of this consent can be sent to you electronically.  As this is a virtual visit, video technology does not allow for your provider to perform a traditional examination. This may limit your provider's ability to fully assess your condition. If your provider identifies any concerns that need to be evaluated in person or the need to arrange testing (such as labs, EKG, etc.), we will make arrangements to do so. Although advances in technology are sophisticated, we cannot ensure that it will always work on either your end or our end. If the connection with a video visit is poor, the visit may have to be switched to a telephone visit. With either a video or telephone visit, we are not always able to ensure that we have a secure connection.  By engaging in this virtual visit, you consent to the provision of healthcare and authorize for your insurance to be billed (if applicable) for the services provided during this visit. Depending on your insurance coverage, you may receive a charge related to this service.  I need to obtain your verbal consent now. Are you willing to proceed with your visit today? [name] has provided verbal consent on 03/23/2022 for a virtual visit (video or telephone). Jordan [name], [place]  Date: 03/23/2022 1:20 PM  Virtual Visit via Video Note   I, Jordan [name], connected with  Jordan [name]  ([id number], 02/22/1976) on 03/23/22 at  1:00 PM EDT by a video-enabled telemedicine application and verified that I am speaking with the correct person using two identifiers.  Location: Patient: Virtual Visit  Location Patient: Home Provider: Virtual Visit Location Provider: Home Office   I discussed the limitations of evaluation and management by telemedicine and the availability of in person appointments. The patient expressed understanding and agreed to proceed.    History of Present Illness: Jordan [name] is a 46 y.o. who identifies as a male who was assigned male at birth, and is being seen today for pain of Left leg, predominately posterior knee, overt he past week, worsening overnight to make it painful to ambulate. Is able to bare weight. Denies fever, chills, trauma or injury. 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 Discussed this new pain with his specialist before and was educated that he likely is placing much more weight and stress on the L hip and knee to alleviate stress on his R hip which has been an issue for some time. Was supposed to be given crutches but says that he has not received them yet. Currently on Tramadol for his chronic pain until replacement. Has taken but not helping the LLE. Notes some swelling behind the knee and some of the superior, anterior knee. Denies any distal swelling. Denies numbness or tingling. Tried to call his Orthopedist today but states the office staff he spoke with was so rude to him that he had to hang up to avoid confrontation.    HPI: HPI  Problems:  Patient Active Problem List  Diagnosis Date Noted   Avascular necrosis of bone of right hip (HCC) 02/20/2022   Avascular necrosis of bone of left hip (HCC) 02/20/2022   Right groin pain 01/25/2022   Low testosterone in male 09/04/2021   Erectile dysfunction 11/02/2020   Depression, recurrent (HCC) 04/01/2020   Elevated LDL cholesterol level 07/29/2019   SOB (shortness of breath) 07/27/2019   Seasonal and perennial allergic rhinoconjunctivitis 07/20/2019   Asthma 07/20/2019   Heartburn  07/20/2019   Stress and adjustment reaction 07/14/2019   Hypertension 07/14/2019    Allergies: No Known Allergies Medications:  Current Outpatient Medications:    aspirin EC 81 MG tablet, Take 1 tablet (81 mg total) by mouth 2 (two) times daily. To be taken after surgery to prevent blood clots, Disp: 84 tablet, Rfl: 0   docusate sodium (COLACE) 100 MG capsule, Take 1 capsule (100 mg total) by mouth daily as needed., Disp: 30 capsule, Rfl: 2   meloxicam (MOBIC) 15 MG tablet, Take 1 tablet (15 mg total) by mouth daily., Disp: 15 tablet, Rfl: 0   methocarbamol (ROBAXIN-750) 750 MG tablet, Take 1 tablet (750 mg total) by mouth 2 (two) times daily as needed for muscle spasms., Disp: 20 tablet, Rfl: 2   ondansetron (ZOFRAN) 4 MG tablet, Take 1 tablet (4 mg total) by mouth every 8 (eight) hours as needed for nausea or vomiting., Disp: 40 tablet, Rfl: 0   oxyCODONE-acetaminophen (PERCOCET) 5-325 MG tablet, Take 1-2 tablets by mouth every 6 (six) hours as needed. To be taken after surgery, Disp: 40 tablet, Rfl: 0   albuterol (PROVENTIL) (2.5 MG/3ML) 0.083% nebulizer solution, Take 3 mLs (2.5 mg total) by nebulization every 4 (four) hours as needed for wheezing or shortness of breath., Disp: 75 mL, Rfl: 1   albuterol (VENTOLIN HFA) 108 (90 Base) MCG/ACT inhaler, Inhale 2 puffs into the lungs every 6 (six) hours as needed for wheezing or shortness of breath., Disp: 8 g, Rfl: 0   amLODipine (NORVASC) 10 MG tablet, Take 1 tablet (10 mg total) by mouth daily., Disp: 90 tablet, Rfl: 3   azelastine (ASTELIN) 0.1 % nasal spray, 2 sprays per nostril 2 times daily as needed for drainage., Disp: 30 mL, Rfl: 5   beclomethasone (QVAR REDIHALER) 80 MCG/ACT inhaler, Inhale 2 puffs into the lungs 2 (two) times daily. Rinse mouth out after each use. (Patient not taking: Reported on 03/13/2022), Disp: 10.6 g, Rfl: 5   Budeson-Glycopyrrol-Formoterol (BREZTRI AEROSPHERE) 160-9-4.8 MCG/ACT AERO, Inhale 2 puffs into the lungs in  the morning and at bedtime., Disp: 10.7 g, Rfl: 5   EPINEPHrine 0.3 mg/0.3 mL IJ SOAJ injection, INJECT 0.3 MLS INTO MUSCLE, Disp: 1 each, Rfl: 2   fenofibrate (TRICOR) 145 MG tablet, TAKE 1 TABLET(145 MG) BY MOUTH DAILY, Disp: 90 tablet, Rfl: 1   ipratropium (ATROVENT) 0.06 % nasal spray, Place 2 sprays in each nostril up to three times a day as needed, Disp: 15 mL, Rfl: 5   losartan (COZAAR) 25 MG tablet, Take 1 tablet (25 mg total) by mouth daily., Disp: 90 tablet, Rfl: 3   Mepolizumab (NUCALA) 100 MG/ML SOAJ, Inject 1 mL (100 mg total) into the skin every 28 (twenty-eight) days., Disp: 1 mL, Rfl: 11   montelukast (SINGULAIR) 10 MG tablet, TAKE 1 TABLET BY MOUTH EVERYDAY AT BEDTIME, Disp: 30 tablet, Rfl: 5   ondansetron (ZOFRAN) 4 MG tablet, Take 1 tablet (4 mg total) by mouth every 8 (eight) hours as needed for nausea or vomiting., Disp: 21  tablet, Rfl: 0   pantoprazole (PROTONIX) 20 MG tablet, Take 1 tablet (20 mg total) by mouth 2 (two) times daily., Disp: 60 tablet, Rfl: 3   penicillin v potassium (VEETID) 500 MG tablet, Take 1 tablet (500 mg total) by mouth 3 (three) times daily for 10 days., Disp: 30 tablet, Rfl: 0   traMADol (ULTRAM) 50 MG tablet, Take 1 tablet (50 mg total) by mouth 2 (two) times daily as needed., Disp: 30 tablet, Rfl: 0   trolamine salicylate (ASPERCREME) 10 % cream, Apply 1 Application topically as needed for muscle pain., Disp: , Rfl:    vortioxetine HBr (TRINTELLIX) 10 MG TABS tablet, Take 1 tablet (10 mg total) by mouth daily., Disp: 30 tablet, Rfl: 5  Current Facility-Administered Medications:    Mepolizumab SOLR 100 mg, 100 mg, Subcutaneous, Q28 days, Marcelyn Bruins, MD, 100 mg at 01/30/22 1743  Observations/Objective: Patient is well-developed, well-nourished in no acute distress.  Resting comfortably  at home.  Head is normocephalic, atraumatic.  No labored breathing. Speech is clear and coherent with logical content.  Patient is alert and  oriented at baseline.  Left leg visualized on camera. Note some superior swelling above the knee, diffuse, without appreciable erythema. No distal swelling noted. Able to fully extend knee. Flexion with some pain. Unable to fully visualize the back of the knee in greatest clarity but no appreciable skin changes noted.   Assessment and Plan: 1. Acute pain of left knee - meloxicam (MOBIC) 15 MG tablet; Take 1 tablet (15 mg total) by mouth daily.  Dispense: 15 tablet; Refill: 0  Question combination of some arthritic changes flared from all the weight/stress he has had to place on the LLE, also with concern for baker's cyst. Less likely things would include gout/pseudogout. Low suspicion for clot giving no distal swelling appreciated. RICE. Start Meloxicam once daily in addition to Tramadol Will message his ortho directly in case they want to offer him other recommendations. Resources of Ortho UC discussed. ER precautions reviewed.  Follow Up Instructions: I discussed the assessment and treatment plan with the patient. The patient was provided an opportunity to ask questions and all were answered. The patient agreed with the plan and demonstrated an understanding of the instructions.  A copy of instructions were sent to the patient via MyChart unless otherwise noted below.   The patient was advised to call back or seek an in-person evaluation if the symptoms worsen or if the condition fails to improve as anticipated.  Time:  I spent 10 minutes with the patient via telehealth technology discussing the above problems/concerns.    Piedad Climes, PA-C

## 2022-03-23 NOTE — Patient Instructions (Addendum)
Jordan [name], thank you for joining [name], PA-C for today's virtual visit.  While this provider is not your primary care provider (PCP), if your PCP is located in our provider database this encounter information will be shared with them immediately following your visit.   [facility] account gives you access to today's visit and all your visits, tests, and labs performed at [facility] " click here if you don't have a [facility] account or go to [email or web address]  Consent: (Patient) Jordan [name] provided verbal consent for this virtual visit at the beginning of the encounter.  Current Medications:  Current Outpatient Medications:    aspirin EC 81 MG tablet, Take 1 tablet (81 mg total) by mouth 2 (two) times daily. To be taken after surgery to prevent blood clots, Disp: 84 tablet, Rfl: 0   docusate sodium (COLACE) 100 MG capsule, Take 1 capsule (100 mg total) by mouth daily as needed., Disp: 30 capsule, Rfl: 2   methocarbamol (ROBAXIN-750) 750 MG tablet, Take 1 tablet (750 mg total) by mouth 2 (two) times daily as needed for muscle spasms., Disp: 20 tablet, Rfl: 2   ondansetron (ZOFRAN) 4 MG tablet, Take 1 tablet (4 mg total) by mouth every 8 (eight) hours as needed for nausea or vomiting., Disp: 40 tablet, Rfl: 0   oxyCODONE-acetaminophen (PERCOCET) 5-325 MG tablet, Take 1-2 tablets by mouth every 6 (six) hours as needed. To be taken after surgery, Disp: 40 tablet, Rfl: 0   albuterol (PROVENTIL) (2.5 MG/3ML) 0.083% nebulizer solution, Take 3 mLs (2.5 mg total) by nebulization every 4 (four) hours as needed for wheezing or shortness of breath., Disp: 75 mL, Rfl: 1   albuterol (VENTOLIN HFA) 108 (90 Base) MCG/ACT inhaler, Inhale 2 puffs into the lungs every 6 (six) hours as needed for wheezing or shortness of breath., Disp: 8 g, Rfl: 0   amLODipine (NORVASC) 10 MG tablet, Take 1 tablet (10 mg total) by mouth daily., Disp: 90 tablet, Rfl:  3   azelastine (ASTELIN) 0.1 % nasal spray, 2 sprays per nostril 2 times daily as needed for drainage., Disp: 30 mL, Rfl: 5   beclomethasone (QVAR REDIHALER) 80 MCG/ACT inhaler, Inhale 2 puffs into the lungs 2 (two) times daily. Rinse mouth out after each use. (Patient not taking: Reported on 03/13/2022), Disp: 10.6 g, Rfl: 5   Budeson-Glycopyrrol-Formoterol (BREZTRI AEROSPHERE) 160-9-4.8 MCG/ACT AERO, Inhale 2 puffs into the lungs in the morning and at bedtime., Disp: 10.7 g, Rfl: 5   EPINEPHrine 0.3 mg/0.3 mL IJ SOAJ injection, INJECT 0.3 MLS INTO MUSCLE, Disp: 1 each, Rfl: 2   fenofibrate (TRICOR) 145 MG tablet, TAKE 1 TABLET(145 MG) BY MOUTH DAILY, Disp: 90 tablet, Rfl: 1   ipratropium (ATROVENT) 0.06 % nasal spray, Place 2 sprays in each nostril up to three times a day as needed, Disp: 15 mL, Rfl: 5   losartan (COZAAR) 25 MG tablet, Take 1 tablet (25 mg total) by mouth daily., Disp: 90 tablet, Rfl: 3   Mepolizumab (NUCALA) 100 MG/ML SOAJ, Inject 1 mL (100 mg total) into the skin every 28 (twenty-eight) days., Disp: 1 mL, Rfl: 11   montelukast (SINGULAIR) 10 MG tablet, TAKE 1 TABLET BY MOUTH EVERYDAY AT BEDTIME, Disp: 30 tablet, Rfl: 5   ondansetron (ZOFRAN) 4 MG tablet, Take 1 tablet (4 mg total) by mouth every 8 (eight) hours as needed for nausea or vomiting., Disp: 21 tablet, Rfl: 0   pantoprazole (PROTONIX) 20 MG  tablet, Take 1 tablet (20 mg total) by mouth 2 (two) times daily., Disp: 60 tablet, Rfl: 3   penicillin v potassium (VEETID) 500 MG tablet, Take 1 tablet (500 mg total) by mouth 3 (three) times daily for 10 days., Disp: 30 tablet, Rfl: 0   predniSONE (STERAPRED UNI-PAK 21 TAB) 10 MG (21) TBPK tablet, As directed x 6 days, Disp: 21 tablet, Rfl: 0   traMADol (ULTRAM) 50 MG tablet, Take 1 tablet (50 mg total) by mouth 2 (two) times daily as needed., Disp: 30 tablet, Rfl: 0   trolamine salicylate (ASPERCREME) 10 % cream, Apply 1 Application topically as needed for muscle pain., Disp: ,  Rfl:    vortioxetine HBr (TRINTELLIX) 10 MG TABS tablet, Take 1 tablet (10 mg total) by mouth daily., Disp: 30 tablet, Rfl: 5  Current Facility-Administered Medications:    Mepolizumab SOLR 100 mg, 100 mg, Subcutaneous, Q28 days, Marcelyn Bruins, MD, 100 mg at 01/30/22 1743   Medications ordered in this encounter:  No orders of the defined types were placed in this encounter.    *If you need refills on other medications prior to your next appointment, please contact your pharmacy*  Follow-Up: Call back or seek an in-person evaluation if the symptoms worsen or if the condition fails to improve as anticipated.  [facility] [phone]  Other Instructions Ice and elevate the leg. Apply ace wrap for compression as discussed. Take the Meloxicam once daily with food. Ok to take the Tramadol as prescribed.  Contact your Orthopedist for follow-up. If anything worsening, I want you to be seen at an Ortho Urgent Care -- see below. If any severe pain or inability to walk, you need an ER evaluation.     If you have been instructed to have an in-person evaluation today at a local Urgent Care facility, please use the link below. It will take you to a list of all of our available [facility], including address, phone number and hours of operation. Please do not delay care.  [facility]  If you or a family member do not have a primary care provider, use the link below to schedule a visit and establish care. When you choose a [facility] primary care physician or advanced practice provider, you gain a long-term partner in health. Find a Primary Care Provider  Learn more about [facility]'s in-office and virtual care options: [facility] - Get Care Now

## 2022-03-24 ENCOUNTER — Telehealth: Payer: Medicare HMO | Admitting: Family Medicine

## 2022-03-24 DIAGNOSIS — M79604 Pain in right leg: Secondary | ICD-10-CM | POA: Diagnosis not present

## 2022-03-24 DIAGNOSIS — M25551 Pain in right hip: Secondary | ICD-10-CM

## 2022-03-24 MED ORDER — PREDNISONE 10 MG PO TABS: 10.0000 mg | ORAL_TABLET | Freq: Every day | ORAL | 0 refills | Status: AC

## 2022-03-24 NOTE — Patient Instructions (Signed)
Radicular Pain Radicular pain is a type of pain that spreads from your back or neck along a spinal nerve. Spinal nerves are nerves that leave the spinal cord and go to the muscles. Radicular pain is sometimes called radiculopathy, radiculitis, or a pinched nerve. When you have this type of pain, you may also have weakness, numbness, or tingling in the area of your body that is supplied by the nerve. The pain may feel sharp and burning. Depending on which spinal nerve is affected, the pain may occur in the: Neck area (cervical radicular pain). You may also feel pain, numbness, weakness, or tingling in the arms. Mid-spine area (thoracic radicular pain). You would feel this pain in the back and chest. This type is rare. Lower back area (lumbar radicular pain). You would feel this pain as low back pain. You may feel pain, numbness, weakness, or tingling in the buttocks or legs. Sciatica is a type of lumbar radicular pain that shoots down the back of the leg. Radicular pain occurs when one of the spinal nerves becomes irritated or squeezed (compressed). It is often caused by something pushing on a spinal nerve, such as one of the bones of the spine (vertebrae) or one of the round cushions between vertebrae (intervertebral disks). This can result from: An injury. Wear and tear or aging of a disk. The growth of a bone spur that pushes on the nerve. Radicular pain often goes away when you follow instructions from your health care provider for relieving pain at home. How is this treated? Treatment may depend on the cause of the condition and may include: Working with a physical therapist. Taking pain medicine. Applying heat or ice or both to the affected areas. Doing stretches to improve flexibility. Having surgery. This may be needed if other treatments do not help. Different types of surgery may be done depending on the cause of this condition. Follow these instructions at home: Managing pain     If  directed, put ice on the affected area. To do this: Put ice in a plastic bag. Place a towel between your skin and the bag. Leave the ice on for 20 minutes, 2-3 times a day. Remove the ice if your skin turns bright red. This is very important. If you cannot feel pain, heat, or cold, you have a greater risk of damage to the area. If directed, apply heat to the affected area as often as told by your health care provider. Use the heat source that your health care provider recommends, such as a moist heat pack or a heating pad. Place a towel between your skin and the heat source. Leave the heat on for 20-30 minutes. Remove the heat if your skin turns bright red. This is especially important if you are unable to feel pain, heat, or cold. You have a greater risk of getting burned. Activity Do not sit or rest in bed for long periods of time. Try to stay as active as possible. Ask your health care provider what type of exercise or activity is best for you. Avoid activities that make your pain worse, such as bending and lifting. You may have to avoid lifting. Ask your health care provider how much you can safely lift. Practice using proper technique when lifting items. Proper lifting technique involves bending your knees and rising up. Do strength and range-of-motion exercises only as told by your health care provider or physical therapist. General instructions Take over-the-counter and prescription medicines only as told by your   health care provider. Pay attention to any changes in your symptoms. Keep all follow-up visits. This is important. Contact a health care provider if: Your pain and other symptoms get worse. Your pain medicine is not helping. Your pain has not improved after a few weeks of home care. You have a fever. Get help right away if: You have severe pain, weakness, or numbness. You have difficulty with bladder or bowel control. Summary Radicular pain is a type of pain that spreads  from your back or neck along a spinal nerve. When you have radicular pain, you may also have weakness, numbness, or tingling in the area of your body that is supplied by the nerve. The pain may feel sharp or burning. Radicular pain may be treated with ice, heat, medicines, or physical therapy. This information is not intended to replace advice given to you by your health care provider. Make sure you discuss any questions you have with your health care provider. Document Revised: 11/17/2020 Document Reviewed: 11/17/2020 Elsevier Patient Education  2023 Elsevier Inc.  

## 2022-03-24 NOTE — Progress Notes (Signed)
Virtual Visit Consent   Jordan [name], you are scheduled for a virtual visit with a [facility] provider today. Just as with appointments in the office, your consent must be obtained to participate. Your consent will be active for this visit and any virtual visit you may have with one of our providers in the next 365 days. If you have a MyChart account, a copy of this consent can be sent to you electronically.  As this is a virtual visit, video technology does not allow for your provider to perform a traditional examination. This may limit your provider's ability to fully assess your condition. If your provider identifies any concerns that need to be evaluated in person or the need to arrange testing (such as labs, EKG, etc.), we will make arrangements to do so. Although advances in technology are sophisticated, we cannot ensure that it will always work on either your end or our end. If the connection with a video visit is poor, the visit may have to be switched to a telephone visit. With either a video or telephone visit, we are not always able to ensure that we have a secure connection.  By engaging in this virtual visit, you consent to the provision of healthcare and authorize for your insurance to be billed (if applicable) for the services provided during this visit. Depending on your insurance coverage, you may receive a charge related to this service.  I need to obtain your verbal consent now. Are you willing to proceed with your visit today? Jordan [name] has provided verbal consent on 03/24/2022 for a virtual visit (video or telephone). Dellia Nims, FNP  Date: 03/24/2022 12:37 PM  Virtual Visit via Video Note   I, Dellia Nims, connected with  Jordan [name]  ([id number], 1976/01/02) on 03/24/22 at 12:30 PM EDT by a video-enabled telemedicine application and verified that I am speaking with the correct person using two identifiers.  Location: Patient: Virtual Visit Location Patient:  Home Provider: Virtual Visit Location Provider: Home Office   I discussed the limitations of evaluation and management by telemedicine and the availability of in person appointments. The patient expressed understanding and agreed to proceed.    History of Present Illness: Jordan [name] is a 46 y.o. who identifies as a male who was assigned male at birth, and is being seen today for left and right lower leg pain. He had been on steroids and stopped them thinking he was having surgery on rt hip. They started him on meloxicam but it is not helping. He says color is normal of legs. He reports mild swelling reporting he has had a scan with contrast of hips and does not have a blood clot. See prev notes.   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 To be taken after surgery to prevent blood clots, Disp: 84 tablet, Rfl: 0   docusate sodium (COLACE) 100 MG capsule, Take 1 capsule (100 mg total) by mouth daily as needed., Disp: 30 capsule, Rfl: 2   methocarbamol (ROBAXIN-750) 750 MG tablet, Take 1 tablet (750 mg total) by mouth 2 (two) times daily as needed for muscle spasms., Disp: 20 tablet, Rfl: 2   ondansetron (ZOFRAN) 4 MG tablet, Take 1 tablet (4 mg total) by mouth every 8 (eight)  hours as needed for nausea or vomiting., Disp: 40 tablet, Rfl: 0   oxyCODONE-acetaminophen (PERCOCET) 5-325 MG tablet, Take 1-2 tablets by mouth every 6 (six) hours as needed. To be taken after surgery, Disp: 40 tablet, Rfl: 0   predniSONE (DELTASONE) 10 MG tablet, Take 1 tablet (10 mg total) by mouth daily with breakfast for 6 days. 6 day dose pack of 10 mg prednisone as directed., Disp: 1 tablet, Rfl: 0   albuterol (PROVENTIL) (2.5 MG/3ML) 0.083% nebulizer solution, Take 3 mLs (2.5 mg total) by nebulization every 4 (four) hours as needed for wheezing or shortness of breath., Disp: 75 mL, Rfl: 1   albuterol (VENTOLIN HFA) 108 (90 Base) MCG/ACT inhaler, Inhale 2 puffs into the lungs every 6 (six) hours as needed for wheezing or shortness of breath., Disp: 8 g, Rfl: 0   amLODipine (NORVASC) 10 MG tablet, Take 1 tablet (10 mg total) by mouth daily., Disp: 90 tablet, Rfl: 3   azelastine (ASTELIN) 0.1 % nasal spray, 2 sprays per nostril 2 times daily as needed for drainage., Disp: 30 mL, Rfl: 5   beclomethasone (QVAR REDIHALER) 80 MCG/ACT inhaler, Inhale 2 puffs into the lungs 2 (two) times daily. Rinse mouth out after each use. (Patient not taking: Reported on 03/13/2022), Disp: 10.6 g, Rfl: 5   Budeson-Glycopyrrol-Formoterol (BREZTRI AEROSPHERE) 160-9-4.8 MCG/ACT AERO, Inhale 2 puffs into the lungs in the morning and at bedtime., Disp: 10.7 g, Rfl: 5   EPINEPHrine 0.3 mg/0.3 mL IJ SOAJ injection, INJECT 0.3 MLS INTO MUSCLE, Disp: 1 each, Rfl: 2   fenofibrate (TRICOR) 145 MG tablet, TAKE 1 TABLET(145 MG) BY MOUTH DAILY, Disp: 90 tablet, Rfl: 1   ipratropium (ATROVENT) 0.06 % nasal spray, Place 2 sprays in each nostril up to three times a day as needed, Disp: 15 mL, Rfl: 5   losartan (COZAAR) 25 MG tablet, Take 1 tablet (25 mg total) by mouth daily., Disp: 90 tablet, Rfl: 3   meloxicam (MOBIC) 15 MG tablet, Take 1 tablet (15 mg total) by mouth daily., Disp: 15 tablet, Rfl: 0   Mepolizumab (NUCALA) 100  MG/ML SOAJ, Inject 1 mL (100 mg total) into the skin every 28 (twenty-eight) days., Disp: 1 mL, Rfl: 11   montelukast (SINGULAIR) 10 MG tablet, TAKE 1 TABLET BY MOUTH EVERYDAY AT BEDTIME, Disp: 30 tablet, Rfl: 5   ondansetron (ZOFRAN) 4 MG tablet, Take 1 tablet (4 mg total) by mouth every 8 (eight) hours as needed for nausea or vomiting., Disp: 21 tablet, Rfl: 0   pantoprazole (PROTONIX) 20 MG tablet, Take 1 tablet (20 mg total) by mouth 2 (two) times daily., Disp: 60 tablet, Rfl: 3   penicillin v potassium (VEETID) 500 MG tablet, Take 1 tablet (500 mg total) by mouth 3 (three) times daily for 10 days., Disp: 30 tablet, Rfl: 0   traMADol (ULTRAM) 50 MG tablet, Take 1 tablet (50 mg total) by mouth 2 (two) times daily as needed., Disp: 30  tablet, Rfl: 0   trolamine salicylate (ASPERCREME) 10 % cream, Apply 1 Application topically as needed for muscle pain., Disp: , Rfl:    vortioxetine HBr (TRINTELLIX) 10 MG TABS tablet, Take 1 tablet (10 mg total) by mouth daily., Disp: 30 tablet, Rfl: 5  Current Facility-Administered Medications:    Mepolizumab SOLR 100 mg, 100 mg, Subcutaneous, Q28 days, Kennith Gain, MD, 100 mg at 01/30/22 1743  Observations/Objective: Patient is well-developed, well-nourished in no acute distress.  Resting comfortably  at home.  Head is normocephalic, atraumatic.  No labored breathing.  Speech is clear and coherent with logical content.  Patient is alert and oriented at baseline.    Assessment and Plan: 1. Right hip pain  2. Right leg pain  Hold meloxicam, start prednisone, follow up with ortho as need if sx persist or worsen.   Follow Up Instructions: I discussed the assessment and treatment plan with the patient. The patient was provided an opportunity to ask questions and all were answered. The patient agreed with the plan and demonstrated an understanding of the instructions.  A copy of instructions were sent to the patient via MyChart unless  otherwise noted below.     The patient was advised to call back or seek an in-person evaluation if the symptoms worsen or if the condition fails to improve as anticipated.  Time:  I spent 8 minutes with the patient via telehealth technology discussing the above problems/concerns.    Dellia Nims, FNP

## 2022-03-26 ENCOUNTER — Telehealth: Payer: Medicare HMO | Admitting: Physician Assistant

## 2022-03-26 ENCOUNTER — Ambulatory Visit (HOSPITAL_COMMUNITY): Admission: RE | Admit: 2022-03-26 | Payer: Medicare HMO | Source: Home / Self Care | Admitting: Orthopaedic Surgery

## 2022-03-26 ENCOUNTER — Encounter (HOSPITAL_COMMUNITY): Admission: RE | Payer: Self-pay | Source: Home / Self Care

## 2022-03-26 ENCOUNTER — Ambulatory Visit (HOSPITAL_COMMUNITY): Payer: Medicare HMO | Admitting: Psychiatry

## 2022-03-26 ENCOUNTER — Other Ambulatory Visit: Payer: Self-pay | Admitting: Physician Assistant

## 2022-03-26 ENCOUNTER — Telehealth: Payer: Self-pay | Admitting: Orthopaedic Surgery

## 2022-03-26 DIAGNOSIS — M87051 Idiopathic aseptic necrosis of right femur: Secondary | ICD-10-CM

## 2022-03-26 DIAGNOSIS — L989 Disorder of the skin and subcutaneous tissue, unspecified: Secondary | ICD-10-CM

## 2022-03-26 SURGERY — ARTHROPLASTY, HIP, TOTAL, ANTERIOR APPROACH
Anesthesia: Spinal | Site: Hip | Laterality: Right

## 2022-03-26 MED ORDER — TRAMADOL HCL 50 MG PO TABS: 50.0000 mg | ORAL_TABLET | Freq: Two times a day (BID) | ORAL | 0 refills | Status: DC | PRN

## 2022-03-26 NOTE — Progress Notes (Signed)
Virtual Visit Consent   [name], you are scheduled for a virtual visit with a [facility] provider today. Just as with appointments in the office, your consent must be obtained to participate. Your consent will be active for this visit and any virtual visit you may have with one of our providers in the next 365 days. If you have a MyChart account, a copy of this consent can be sent to you electronically.  As this is a virtual visit, video technology does not allow for your provider to perform a traditional examination. This may limit your provider's ability to fully assess your condition. If your provider identifies any concerns that need to be evaluated in person or the need to arrange testing (such as labs, EKG, etc.), we will make arrangements to do so. Although advances in technology are sophisticated, we cannot ensure that it will always work on either your end or our end. If the connection with a video visit is poor, the visit may have to be switched to a telephone visit. With either a video or telephone visit, we are not always able to ensure that we have a secure connection.  By engaging in this virtual visit, you consent to the provision of healthcare and authorize for your insurance to be billed (if applicable) for the services provided during this visit. Depending on your insurance coverage, you may receive a charge related to this service.  I need to obtain your verbal consent now. Are you willing to proceed with your visit today? [name] has provided verbal consent on 03/26/2022 for a virtual visit (video or telephone). Mar Daring, PA-C  Date: 03/26/2022 1:43 PM  Virtual Visit via Video Note   I, Mar Daring, connected with  Jordan [name]  ([id number], February 22, 1976) on 03/26/22 at  1:15 PM EDT by a video-enabled telemedicine application and verified that I am speaking with the correct person using two identifiers.  Location: Patient: Virtual Visit  Location Patient: Home Provider: Virtual Visit Location Provider: Home Office   I discussed the limitations of evaluation and management by telemedicine and the availability of in person appointments. The patient expressed understanding and agreed to proceed.    History of Present Illness: Jordan [name] is a 46 y.o. who identifies as a male who was assigned male at birth, and is being seen today for possible blood clot in the left arm. He has been evaluated recently due to AVN in the right hip. He was scheduled to have surgery today for the hip, but the procedure was cancelled. However, today he noticed a "spot" arise in the left arm near the antecubital fossa. Reports it came up suddenly. Denies any known injury, bug bite, injection to the affected area. He denies any tenderness. Reports the area is soft to touch. Denies itching. Area is just swollen and discolored.    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 To be taken after surgery to prevent blood clots, Disp: 84 tablet, Rfl: 0   docusate sodium (COLACE) 100 MG capsule, Take 1 capsule (100 mg total) by mouth daily as needed., Disp: 30 capsule, Rfl: 2   methocarbamol (ROBAXIN-750) 750 MG tablet, Take 1 tablet (750 mg total) by mouth 2  (two) times daily as needed for muscle spasms., Disp: 20 tablet, Rfl: 2   ondansetron (ZOFRAN) 4 MG tablet, Take 1 tablet (4 mg total) by mouth every 8 (eight) hours as needed for nausea or vomiting., Disp: 40 tablet, Rfl: 0   oxyCODONE-acetaminophen (PERCOCET) 5-325 MG tablet, Take 1-2 tablets by mouth every 6 (six) hours as needed. To be taken after surgery, Disp: 40 tablet, Rfl: 0   albuterol (PROVENTIL) (2.5 MG/3ML) 0.083% nebulizer solution, Take 3 mLs (2.5 mg total) by nebulization every 4 (four) hours as needed for wheezing or shortness of breath., Disp: 75 mL, Rfl: 1   albuterol (VENTOLIN HFA) 108 (90 Base) MCG/ACT inhaler, Inhale 2 puffs into the lungs every 6 (six) hours as needed for wheezing or shortness of breath., Disp: 8 g, Rfl: 0   amLODipine (NORVASC) 10 MG tablet, Take 1 tablet (10 mg total) by mouth daily., Disp: 90 tablet, Rfl: 3   azelastine (ASTELIN) 0.1 % nasal spray, 2 sprays per nostril 2 times daily as needed for drainage., Disp: 30 mL, Rfl: 5   beclomethasone (QVAR REDIHALER) 80 MCG/ACT inhaler, Inhale 2 puffs into the lungs 2 (two) times daily. Rinse mouth out after each use. (Patient not taking: Reported on 03/13/2022), Disp: 10.6 g, Rfl: 5   Budeson-Glycopyrrol-Formoterol (BREZTRI AEROSPHERE) 160-9-4.8 MCG/ACT AERO, Inhale 2 puffs into the lungs in the morning and at bedtime., Disp: 10.7 g, Rfl: 5   EPINEPHrine 0.3 mg/0.3 mL IJ SOAJ injection, INJECT 0.3 MLS INTO MUSCLE, Disp: 1 each, Rfl: 2   fenofibrate (TRICOR) 145 MG tablet, TAKE 1 TABLET(145 MG) BY MOUTH DAILY, Disp: 90 tablet, Rfl: 1   ipratropium (ATROVENT) 0.06 % nasal spray, Place 2 sprays in each nostril up to three times a day as needed, Disp: 15 mL, Rfl: 5   losartan (COZAAR) 25 MG tablet, Take 1 tablet (25 mg total) by mouth daily., Disp: 90 tablet, Rfl: 3   meloxicam (MOBIC) 15 MG tablet, Take 1 tablet (15 mg total) by mouth daily., Disp: 15 tablet, Rfl: 0   Mepolizumab (NUCALA) 100 MG/ML SOAJ, Inject 1 mL  (100 mg total) into the skin every 28 (twenty-eight) days., Disp: 1 mL, Rfl: 11   montelukast (SINGULAIR) 10 MG tablet, TAKE 1 TABLET BY MOUTH EVERYDAY AT BEDTIME, Disp: 30 tablet, Rfl: 5   ondansetron (ZOFRAN) 4 MG tablet, Take 1 tablet (4 mg total) by mouth every 8 (eight) hours as needed for nausea or vomiting., Disp: 21 tablet, Rfl: 0   pantoprazole (PROTONIX) 20 MG tablet, Take 1 tablet (20 mg total) by mouth 2 (two) times daily., Disp: 60 tablet, Rfl: 3   penicillin v potassium (VEETID) 500 MG tablet, Take 1 tablet (500 mg total) by mouth 3 (three) times daily for 10 days., Disp: 30 tablet, Rfl: 0   predniSONE (DELTASONE) 10 MG tablet, Take 1 tablet (10 mg total) by mouth daily with breakfast for 6 days. 6 day dose pack of 10 mg prednisone as directed., Disp: 1 tablet, Rfl: 0  traMADol (ULTRAM) 50 MG tablet, Take 1 tablet (50 mg total) by mouth 2 (two) times daily as needed., Disp: 30 tablet, Rfl: 0   trolamine salicylate (ASPERCREME) 10 % cream, Apply 1 Application topically as needed for muscle pain., Disp: , Rfl:    vortioxetine HBr (TRINTELLIX) 10 MG TABS tablet, Take 1 tablet (10 mg total) by mouth daily., Disp: 30 tablet, Rfl: 5  Current Facility-Administered Medications:    Mepolizumab SOLR 100 mg, 100 mg, Subcutaneous, Q28 days, Marcelyn Bruins, MD, 100 mg at 01/30/22 1743  Observations/Objective: Patient is well-developed, well-nourished in no acute distress.  Resting comfortably at home.  Head is normocephalic, atraumatic.  No labored breathing.  Speech is clear and coherent with logical content.  Patient is alert and oriented at baseline.  Area noted on the left arm just superior to the antecubital fossa with reddish-purple discoloration around the wound and seeming to be moving distally; Advised very limited with evaluation through video  Assessment and Plan: 1. Arm skin lesion, left  - Advised evaluation is limited due to being a virtual visit and would most  benefit from an in person exam due to recent history and area arising spontaneously - He agrees and will seek further evaluation in person at a local Urgent Care  Follow Up Instructions: I discussed the assessment and treatment plan with the patient. The patient was provided an opportunity to ask questions and all were answered. The patient agreed with the plan and demonstrated an understanding of the instructions.  A copy of instructions were sent to the patient via MyChart unless otherwise noted below.    The patient was advised to call back or seek an in-person evaluation if the symptoms worsen or if the condition fails to improve as anticipated.  Time:  I spent 10 minutes with the patient via telehealth technology discussing the above problems/concerns.    Margaretann Loveless, PA-C

## 2022-03-26 NOTE — Telephone Encounter (Signed)
I sent in tramadol.  Not sure about the knot?

## 2022-03-26 NOTE — Patient Instructions (Signed)
Jordan [name], thank you for joining Margaretann Loveless, PA-C for today's virtual visit.  While this provider is not your primary care provider (PCP), if your PCP is located in our provider database this encounter information will be shared with them immediately following your visit.   A [facility] MyChart account gives you access to today's visit and all your visits, tests, and labs performed at [facility] " click here if you don't have a [facility] MyChart account or go to [email or web address]  Consent: (Patient) Jordan [name] provided verbal consent for this virtual visit at the beginning of the encounter.  Current Medications:  Current Outpatient Medications:    aspirin EC 81 MG tablet, Take 1 tablet (81 mg total) by mouth 2 (two) times daily. To be taken after surgery to prevent blood clots, Disp: 84 tablet, Rfl: 0   docusate sodium (COLACE) 100 MG capsule, Take 1 capsule (100 mg total) by mouth daily as needed., Disp: 30 capsule, Rfl: 2   methocarbamol (ROBAXIN-750) 750 MG tablet, Take 1 tablet (750 mg total) by mouth 2 (two) times daily as needed for muscle spasms., Disp: 20 tablet, Rfl: 2   ondansetron (ZOFRAN) 4 MG tablet, Take 1 tablet (4 mg total) by mouth every 8 (eight) hours as needed for nausea or vomiting., Disp: 40 tablet, Rfl: 0   oxyCODONE-acetaminophen (PERCOCET) 5-325 MG tablet, Take 1-2 tablets by mouth every 6 (six) hours as needed. To be taken after surgery, Disp: 40 tablet, Rfl: 0   albuterol (PROVENTIL) (2.5 MG/3ML) 0.083% nebulizer solution, Take 3 mLs (2.5 mg total) by nebulization every 4 (four) hours as needed for wheezing or shortness of breath., Disp: 75 mL, Rfl: 1   albuterol (VENTOLIN HFA) 108 (90 Base) MCG/ACT inhaler, Inhale 2 puffs into the lungs every 6 (six) hours as needed for wheezing or shortness of breath., Disp: 8 g, Rfl: 0   amLODipine (NORVASC) 10 MG tablet, Take 1 tablet (10 mg total) by mouth daily., Disp: 90 tablet, Rfl:  3   azelastine (ASTELIN) 0.1 % nasal spray, 2 sprays per nostril 2 times daily as needed for drainage., Disp: 30 mL, Rfl: 5   beclomethasone (QVAR REDIHALER) 80 MCG/ACT inhaler, Inhale 2 puffs into the lungs 2 (two) times daily. Rinse mouth out after each use. (Patient not taking: Reported on 03/13/2022), Disp: 10.6 g, Rfl: 5   Budeson-Glycopyrrol-Formoterol (BREZTRI AEROSPHERE) 160-9-4.8 MCG/ACT AERO, Inhale 2 puffs into the lungs in the morning and at bedtime., Disp: 10.7 g, Rfl: 5   EPINEPHrine 0.3 mg/0.3 mL IJ SOAJ injection, INJECT 0.3 MLS INTO MUSCLE, Disp: 1 each, Rfl: 2   fenofibrate (TRICOR) 145 MG tablet, TAKE 1 TABLET(145 MG) BY MOUTH DAILY, Disp: 90 tablet, Rfl: 1   ipratropium (ATROVENT) 0.06 % nasal spray, Place 2 sprays in each nostril up to three times a day as needed, Disp: 15 mL, Rfl: 5   losartan (COZAAR) 25 MG tablet, Take 1 tablet (25 mg total) by mouth daily., Disp: 90 tablet, Rfl: 3   meloxicam (MOBIC) 15 MG tablet, Take 1 tablet (15 mg total) by mouth daily., Disp: 15 tablet, Rfl: 0   Mepolizumab (NUCALA) 100 MG/ML SOAJ, Inject 1 mL (100 mg total) into the skin every 28 (twenty-eight) days., Disp: 1 mL, Rfl: 11   montelukast (SINGULAIR) 10 MG tablet, TAKE 1 TABLET BY MOUTH EVERYDAY AT BEDTIME, Disp: 30 tablet, Rfl: 5   ondansetron (ZOFRAN) 4 MG tablet, Take 1 tablet (4 mg total) by mouth  every 8 (eight) hours as needed for nausea or vomiting., Disp: 21 tablet, Rfl: 0   pantoprazole (PROTONIX) 20 MG tablet, Take 1 tablet (20 mg total) by mouth 2 (two) times daily., Disp: 60 tablet, Rfl: 3   penicillin v potassium (VEETID) 500 MG tablet, Take 1 tablet (500 mg total) by mouth 3 (three) times daily for 10 days., Disp: 30 tablet, Rfl: 0   predniSONE (DELTASONE) 10 MG tablet, Take 1 tablet (10 mg total) by mouth daily with breakfast for 6 days. 6 day dose pack of 10 mg prednisone as directed., Disp: 1 tablet, Rfl: 0   traMADol (ULTRAM) 50 MG tablet, Take 1 tablet (50 mg total) by  mouth 2 (two) times daily as needed., Disp: 30 tablet, Rfl: 0   trolamine salicylate (ASPERCREME) 10 % cream, Apply 1 Application topically as needed for muscle pain., Disp: , Rfl:    vortioxetine HBr (TRINTELLIX) 10 MG TABS tablet, Take 1 tablet (10 mg total) by mouth daily., Disp: 30 tablet, Rfl: 5  Current Facility-Administered Medications:    Mepolizumab SOLR 100 mg, 100 mg, Subcutaneous, Q28 days, Kennith Gain, MD, 100 mg at 01/30/22 1743   Medications ordered in this encounter:  No orders of the defined types were placed in this encounter.    *If you need refills on other medications prior to your next appointment, please contact your pharmacy*  Follow-Up: Call back or seek an in-person evaluation if the symptoms worsen or if the condition fails to improve as anticipated.  [facility] [phone]  Other Instructions Seek in person evaluation at local Urgent Care   If you have been instructed to have an in-person evaluation today at a local Urgent Care facility, please use the link below. It will take you to a list of all of our available [facility], including address, phone number and hours of operation. Please do not delay care.  [facility]  If you or a family member do not have a primary care provider, use the link below to schedule a visit and establish care. When you choose a [facility] primary care physician or advanced practice provider, you gain a long-term partner in health. Find a Primary Care Provider  Learn more about [facility]'s in-office and virtual care options: [facility]

## 2022-03-26 NOTE — Telephone Encounter (Signed)
Pt wanting someone to call about pain medication..[phone]

## 2022-03-27 NOTE — Telephone Encounter (Signed)
Called and notified patient.

## 2022-03-28 ENCOUNTER — Ambulatory Visit (HOSPITAL_COMMUNITY): Payer: Medicare HMO | Admitting: Psychiatry

## 2022-03-29 ENCOUNTER — Ambulatory Visit
Admission: EM | Admit: 2022-03-29 | Discharge: 2022-03-29 | Disposition: A | Discharge status: Home / Self Care | Payer: Medicare HMO | Attending: Physician Assistant | Admitting: Physician Assistant

## 2022-03-29 ENCOUNTER — Other Ambulatory Visit: Payer: Self-pay

## 2022-03-29 ENCOUNTER — Encounter: Payer: Self-pay | Admitting: Emergency Medicine

## 2022-03-29 DIAGNOSIS — R0789 Other chest pain: Secondary | ICD-10-CM | POA: Diagnosis not present

## 2022-03-29 MED ORDER — CYCLOBENZAPRINE HCL 10 MG PO TABS: 10.0000 mg | ORAL_TABLET | Freq: Two times a day (BID) | ORAL | 0 refills | Status: AC | PRN

## 2022-03-29 NOTE — ED Provider Notes (Signed)
[name]    CSN: [id number] Arrival date & time: 03/29/22  1739      History   Chief Complaint Chief Complaint  Patient presents with   rib pain    HPI Jordan [name] is a 46 y.o. male.   Patient here today for evaluation of pain in the left chest area that he has had for 3 days.  He reports that pain can be worse with movement but not always.  He states at times he has trouble getting comfortable due to discomfort.  He has not had any shortness of breath.  He has had other complaints recently like pain behind his left knee that has improved as well as a bruise to his left arm that he denies any injury could have caused and he is not sure if these are related. He denies any treatment for chest pain. He has not had any lightheadedness or other concerns.   The history is provided by the patient.    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[place]) 02/20/2022   Avascular necrosis of bone of left hip ([place]) 02/20/2022   Right groin pain 01/25/2022   Low testosterone in male 09/04/2021   Erectile dysfunction 11/02/2020   Depression, recurrent ([place]) 04/01/2020   Elevated LDL cholesterol level 07/29/2019   SOB (shortness of breath) 07/27/2019   Seasonal and perennial allergic rhinoconjunctivitis 07/20/2019   Asthma 07/20/2019   Heartburn 07/20/2019   Stress and adjustment reaction 07/14/2019   Hypertension 07/14/2019    Past Surgical History:  Procedure Laterality Date   NO PAST SURGERIES         Home Medications    Prior to Admission medications   Medication Sig Start Date End Date Taking? Authorizing Provider  aspirin EC 81 MG tablet Take 1 tablet (81 mg total) by mouth 2 (two) times  daily. To be taken after surgery to prevent blood clots 03/19/22 03/19/23  Aundra Dubin, PA-C  cyclobenzaprine (FLEXERIL) 10 MG tablet Take 1 tablet (10 mg total) by mouth 2 (two) times daily as needed for muscle spasms. 03/29/22  Yes Francene Finders, PA-C  docusate sodium (COLACE) 100 MG capsule Take 1 capsule (100 mg total) by mouth daily as needed. 03/19/22 03/19/23  Aundra Dubin, PA-C  ondansetron (ZOFRAN) 4 MG tablet Take 1 tablet (4 mg total) by mouth every 8 (eight) hours as needed for nausea or vomiting. 03/19/22   Aundra Dubin, PA-C  oxyCODONE-acetaminophen (PERCOCET) 5-325 MG tablet Take 1-2 tablets by mouth every 6 (six) hours as needed. To be taken after surgery 03/19/22   Aundra Dubin, PA-C  albuterol (PROVENTIL) (2.5 MG/3ML) 0.083% nebulizer solution Take 3 mLs (2.5 mg total) by nebulization every 4 (four) hours as needed for wheezing or shortness of breath. 07/12/21   Althea Charon, FNP  albuterol (VENTOLIN HFA) 108 (90 Base) MCG/ACT inhaler Inhale 2 puffs into the lungs every 6 (six) hours as needed for wheezing or shortness of breath. 11/05/21   Evelina Dun A, FNP  amLODipine (NORVASC) 10 MG tablet Take 1 tablet (10 mg total) by mouth daily. 09/04/21   Nche, Charlene Brooke,  NP  azelastine (ASTELIN) 0.1 % nasal spray 2 sprays per nostril 2 times daily as needed for drainage. 10/19/21   Kennith Gain, MD  beclomethasone (QVAR REDIHALER) 80 MCG/ACT inhaler Inhale 2 puffs into the lungs 2 (two) times daily. Rinse mouth out after each use. Patient not taking: Reported on 03/13/2022 10/19/21   Kennith Gain, MD  Budeson-Glycopyrrol-Formoterol (BREZTRI AEROSPHERE) 160-9-4.8 MCG/ACT AERO Inhale 2 puffs into the lungs in the morning and at bedtime. 10/19/21   Kennith Gain, MD  EPINEPHrine 0.3 mg/0.3 mL IJ SOAJ injection INJECT 0.3 MLS INTO MUSCLE 07/06/20   Kennith Gain, MD  fenofibrate (TRICOR) 145 MG tablet TAKE 1 TABLET(145 MG) BY  MOUTH DAILY 08/04/21   Nche, Charlene Brooke, NP  ipratropium (ATROVENT) 0.06 % nasal spray Place 2 sprays in each nostril up to three times a day as needed 10/19/21   Kennith Gain, MD  losartan (COZAAR) 25 MG tablet Take 1 tablet (25 mg total) by mouth daily. 09/04/21   Nche, Charlene Brooke, NP  meloxicam (MOBIC) 15 MG tablet Take 1 tablet (15 mg total) by mouth daily. 03/23/22   Brunetta Jeans, PA-C  Mepolizumab (NUCALA) 100 MG/ML SOAJ Inject 1 mL (100 mg total) into the skin every 28 (twenty-eight) days. 03/08/22   Kennith Gain, MD  montelukast (SINGULAIR) 10 MG tablet TAKE 1 TABLET BY MOUTH EVERYDAY AT BEDTIME 11/05/21   Hawks, Alyse Low A, FNP  ondansetron (ZOFRAN) 4 MG tablet Take 1 tablet (4 mg total) by mouth every 8 (eight) hours as needed for nausea or vomiting. 09/08/21   Nche, Charlene Brooke, NP  pantoprazole (PROTONIX) 20 MG tablet Take 1 tablet (20 mg total) by mouth 2 (two) times daily. 01/10/22   Valentina Shaggy, MD  predniSONE (DELTASONE) 10 MG tablet Take 1 tablet (10 mg total) by mouth daily with breakfast for 6 days. 6 day dose pack of 10 mg prednisone as directed. Patient not taking: Reported on 03/29/2022 03/24/22 03/30/22  Tempie Hoist, FNP  traMADol (ULTRAM) 50 MG tablet Take 1 tablet (50 mg total) by mouth 2 (two) times daily as needed. 03/26/22   Aundra Dubin, PA-C  trolamine salicylate (ASPERCREME) 10 % cream Apply 1 Application topically as needed for muscle pain.    [provider]  vortioxetine HBr (TRINTELLIX) 10 MG TABS tablet Take 1 tablet (10 mg total) by mouth daily. 11/23/21   Nche, Charlene Brooke, NP    Family History Family History  Problem Relation Age of Onset   Hypertension Mother    Glaucoma Mother 69   Hypertension Father     Social History Social History   Tobacco Use   Smoking status: Never   Smokeless tobacco: Never  Vaping Use   Vaping Use: Never used  Substance Use Topics   Alcohol use: Yes     Alcohol/week: 3.0 - 5.0 standard drinks of alcohol    Types: 3 - 5 Cans of beer per week   Drug use: No     Allergies   Patient has no known allergies.   Review of Systems Review of Systems  Constitutional:  Negative for chills and fever.  Eyes:  Negative for discharge and redness.  Respiratory:  Negative for cough and shortness of breath.   Cardiovascular:  Positive for chest pain.  Gastrointestinal:  Negative for nausea and vomiting.  Neurological:  Negative for numbness.     Physical Exam Triage Vital Signs ED Triage Vitals [03/29/22 1808]  Enc  Vitals Group     BP (!) 150/104     Pulse Rate 89     Resp 18     Temp 98.1 F (36.7 C)     Temp Source Oral     SpO2 97 %     Weight      Height      Head Circumference      Peak Flow      Pain Score 4     Pain Loc      Pain Edu?      Excl. in [place]?    No data found.  Updated Vital Signs BP (!) 150/104 (BP Location: Left Arm)   Pulse 89   Temp 98.1 F (36.7 C) (Oral)   Resp 18   SpO2 97%      Physical Exam Vitals and nursing note reviewed.  Constitutional:      General: He is not in acute distress.    Appearance: Normal appearance. He is not ill-appearing.  HENT:     Head: Normocephalic and atraumatic.  Eyes:     Conjunctiva/sclera: Conjunctivae normal.  Cardiovascular:     Rate and Rhythm: Normal rate and regular rhythm.     Heart sounds: Normal heart sounds.  Pulmonary:     Effort: Pulmonary effort is normal. No respiratory distress.     Breath sounds: Normal breath sounds. No wheezing, rhonchi or rales.  Chest:     Chest wall: Tenderness (mild TTP to left chest.) present.  Neurological:     Mental Status: He is alert.  Psychiatric:        Mood and Affect: Mood normal.        Behavior: Behavior normal.        Thought Content: Thought content normal.      UC Treatments / Results  Labs (all labs ordered are listed, but only abnormal results are displayed) Labs Reviewed - No data to  display  EKG   Radiology No results found.  Procedures Procedures (including critical care time)  Medications Ordered in UC Medications - No data to display  Initial Impression / Assessment and Plan / UC Course  I have reviewed the triage vital signs and the nursing notes.  Pertinent labs & imaging results that were available during my care of the patient were reviewed by me and considered in my medical decision making (see chart for details).    Discussed that in urgent care we are unable to rule out cardiac etiology as we do not have ability to test troponin levels.  Most likely symptoms are related to chest wall muscular injury, will order muscle relaxer but strongly recommended further evaluation in the emergency department if he is concerned for cardiac etiology or if he has worsening pain or lack of improvement.  Patient expresses understanding.  Final Clinical Impressions(s) / UC Diagnoses   Final diagnoses:  Chest wall pain   Discharge Instructions   None    ED Prescriptions     Medication Sig Dispense Auth. Provider   cyclobenzaprine (FLEXERIL) 10 MG tablet Take 1 tablet (10 mg total) by mouth 2 (two) times daily as needed for muscle spasms. 20 tablet Francene Finders, PA-C      PDMP not reviewed this encounter.   Francene Finders, PA-C 03/29/22 1936

## 2022-03-29 NOTE — ED Triage Notes (Signed)
Pt here for pain in left rib area x 3 days; pt sts had pain behind left knee last week and then had bruise to left arm; unsure if related

## 2022-04-04 ENCOUNTER — Other Ambulatory Visit: Payer: Self-pay

## 2022-04-04 ENCOUNTER — Emergency Department (HOSPITAL_BASED_OUTPATIENT_CLINIC_OR_DEPARTMENT_OTHER)
Admission: EM | Admit: 2022-04-04 | Discharge: 2022-04-04 | Disposition: A | Discharge status: Home / Self Care | Payer: Medicare HMO | Attending: Emergency Medicine | Admitting: Emergency Medicine

## 2022-04-04 ENCOUNTER — Ambulatory Visit: Payer: Medicare HMO | Admitting: Family Medicine

## 2022-04-04 ENCOUNTER — Emergency Department (HOSPITAL_BASED_OUTPATIENT_CLINIC_OR_DEPARTMENT_OTHER): Payer: Medicare HMO | Admitting: Radiology

## 2022-04-04 ENCOUNTER — Encounter (HOSPITAL_BASED_OUTPATIENT_CLINIC_OR_DEPARTMENT_OTHER): Payer: Self-pay | Admitting: Emergency Medicine

## 2022-04-04 DIAGNOSIS — R079 Chest pain, unspecified: Secondary | ICD-10-CM | POA: Insufficient documentation

## 2022-04-04 DIAGNOSIS — R600 Localized edema: Secondary | ICD-10-CM | POA: Diagnosis not present

## 2022-04-04 DIAGNOSIS — Z7982 Long term (current) use of aspirin: Secondary | ICD-10-CM | POA: Diagnosis not present

## 2022-04-04 DIAGNOSIS — R0789 Other chest pain: Secondary | ICD-10-CM | POA: Diagnosis not present

## 2022-04-04 DIAGNOSIS — Z20822 Contact with and (suspected) exposure to covid-19: Secondary | ICD-10-CM | POA: Diagnosis not present

## 2022-04-04 LAB — CBC
HCT: 41.3 % (ref 39.0–52.0)
Hemoglobin: 13.5 g/dL (ref 13.0–17.0)
MCH: 29 pg (ref 26.0–34.0)
MCHC: 32.7 g/dL (ref 30.0–36.0)
MCV: 88.8 fL (ref 80.0–100.0)
Platelets: 393 10*3/uL (ref 150–400)
RBC: 4.65 MIL/uL (ref 4.22–5.81)
RDW: 13.6 % (ref 11.5–15.5)
WBC: 6 10*3/uL (ref 4.0–10.5)
nRBC: 0 % (ref 0.0–0.2)

## 2022-04-04 LAB — D-DIMER, QUANTITATIVE: D-Dimer, Quant: <0.27 ug/mL-FEU (ref 0.00–0.50)

## 2022-04-04 LAB — RESP PANEL BY RT-PCR (FLU A&B, COVID) ARPGX2
Influenza A by PCR: NEGATIVE
Influenza B by PCR: NEGATIVE
SARS Coronavirus 2 by RT PCR: NEGATIVE

## 2022-04-04 LAB — BASIC METABOLIC PANEL
Anion gap: 10 (ref 5–15)
BUN: 10 mg/dL (ref 6–20)
CO2: 24 mmol/L (ref 22–32)
Calcium: 9.3 mg/dL (ref 8.9–10.3)
Chloride: 103 mmol/L (ref 98–111)
Creatinine, Ser: 1.01 mg/dL (ref 0.61–1.24)
GFR, Estimated: >60 mL/min (ref >60)
Glucose, Bld: 100 mg/dL — ABNORMAL HIGH (ref 70–99)
Potassium: 4 mmol/L (ref 3.5–5.1)
Sodium: 137 mmol/L (ref 135–145)

## 2022-04-04 LAB — TROPONIN I (HIGH SENSITIVITY)
Troponin I (High Sensitivity): 3 ng/L (ref <18)
Troponin I (High Sensitivity): 3 ng/L (ref <18)

## 2022-04-04 MED ORDER — NAPROXEN 375 MG PO TABS: 375.0000 mg | ORAL_TABLET | Freq: Two times a day (BID) | ORAL | 0 refills | Status: DC

## 2022-04-04 MED ORDER — PANTOPRAZOLE SODIUM 20 MG PO TBEC: 20.0000 mg | DELAYED_RELEASE_TABLET | Freq: Two times a day (BID) | ORAL | 3 refills | Status: DC

## 2022-04-04 NOTE — Discharge Instructions (Signed)
Take your antacid medications for possible acid reflux.  Take the anti-inflammatory medication to see if it helps with pain and discomfort.  Follow-up with your primary care doctor next week to be rechecked.  Return as needed for worsening symptoms

## 2022-04-04 NOTE — ED Triage Notes (Signed)
Left sided chest pain. Over a week.  Denies sobd, denies n/v/d Hx of asthma reports some pain when coughing. constant

## 2022-04-04 NOTE — ED Provider Notes (Signed)
[facility] EMERGENCY DEPT Provider Note   CSN: [id number] Arrival date & time: 04/04/22  1617     History  Chief Complaint  Patient presents with   Chest Pain    Jordan [name] is a 46 y.o. male.  The history is provided by the patient.  Chest Pain Pain location:  L chest Pain quality: pressure and sharp   Pain radiates to:  Does not radiate Duration:  1 week Chronicity:  New Worsened by:  Coughing Associated symptoms: lower extremity edema   Associated symptoms: no shortness of breath        Home Medications Prior to Admission medications   Medication Sig Start Date End Date Taking? Authorizing Provider  aspirin EC 81 MG tablet Take 1 tablet (81 mg total) by mouth 2 (two) times daily. To be taken after surgery to prevent blood clots 03/19/22 03/19/23  Cristie Hem, PA-C  docusate sodium (COLACE) 100 MG capsule Take 1 capsule (100 mg total) by mouth daily as needed. 03/19/22 03/19/23  Cristie Hem, PA-C  naproxen (NAPROSYN) 375 MG tablet Take 1 tablet (375 mg total) by mouth 2 (two) times daily. 04/04/22  Yes Linwood Dibbles, MD  ondansetron (ZOFRAN) 4 MG tablet Take 1 tablet (4 mg total) by mouth every 8 (eight) hours as needed for nausea or vomiting. 03/19/22   Cristie Hem, PA-C  oxyCODONE-acetaminophen (PERCOCET) 5-325 MG tablet Take 1-2 tablets by mouth every 6 (six) hours as needed. To be taken after surgery 03/19/22   Cristie Hem, PA-C  albuterol (PROVENTIL) (2.5 MG/3ML) 0.083% nebulizer solution Take 3 mLs (2.5 mg total) by nebulization every 4 (four) hours as needed for wheezing or shortness of breath. 07/12/21   Nehemiah Settle, FNP  albuterol (VENTOLIN HFA) 108 (90 Base) MCG/ACT inhaler Inhale 2 puffs into the lungs every 6 (six) hours as needed for wheezing or shortness of breath. 11/05/21   Jannifer Rodney A, FNP  amLODipine (NORVASC) 10 MG tablet Take 1 tablet (10 mg total) by mouth daily. 09/04/21   Nche, Bonna Gains, NP  azelastine  (ASTELIN) 0.1 % nasal spray 2 sprays per nostril 2 times daily as needed for drainage. 10/19/21   Marcelyn Bruins, MD  beclomethasone (QVAR REDIHALER) 80 MCG/ACT inhaler Inhale 2 puffs into the lungs 2 (two) times daily. Rinse mouth out after each use. Patient not taking: Reported on 03/13/2022 10/19/21   Marcelyn Bruins, MD  Budeson-Glycopyrrol-Formoterol (BREZTRI AEROSPHERE) 160-9-4.8 MCG/ACT AERO Inhale 2 puffs into the lungs in the morning and at bedtime. 10/19/21   Marcelyn Bruins, MD  cyclobenzaprine (FLEXERIL) 10 MG tablet Take 1 tablet (10 mg total) by mouth 2 (two) times daily as needed for muscle spasms. 03/29/22   Tomi Bamberger, PA-C  EPINEPHrine 0.3 mg/0.3 mL IJ SOAJ injection INJECT 0.3 MLS INTO MUSCLE 07/06/20   Marcelyn Bruins, MD  fenofibrate (TRICOR) 145 MG tablet TAKE 1 TABLET(145 MG) BY MOUTH DAILY 08/04/21   Nche, Bonna Gains, NP  ipratropium (ATROVENT) 0.06 % nasal spray Place 2 sprays in each nostril up to three times a day as needed 10/19/21   Marcelyn Bruins, MD  losartan (COZAAR) 25 MG tablet Take 1 tablet (25 mg total) by mouth daily. 09/04/21   Nche, Bonna Gains, NP  meloxicam (MOBIC) 15 MG tablet Take 1 tablet (15 mg total) by mouth daily. 03/23/22   Waldon Merl, PA-C  Mepolizumab (NUCALA) 100 MG/ML SOAJ Inject 1 mL (100 mg total) into the  skin every 28 (twenty-eight) days. 03/08/22   Marcelyn Bruins, MD  montelukast (SINGULAIR) 10 MG tablet TAKE 1 TABLET BY MOUTH EVERYDAY AT BEDTIME 11/05/21   Hawks, Neysa Bonito A, FNP  ondansetron (ZOFRAN) 4 MG tablet Take 1 tablet (4 mg total) by mouth every 8 (eight) hours as needed for nausea or vomiting. 09/08/21   Nche, Bonna Gains, NP  pantoprazole (PROTONIX) 20 MG tablet Take 1 tablet (20 mg total) by mouth 2 (two) times daily. 04/04/22   Linwood Dibbles, MD  traMADol (ULTRAM) 50 MG tablet Take 1 tablet (50 mg total) by mouth 2 (two) times daily as needed. 03/26/22   Cristie Hem, PA-C  trolamine salicylate (ASPERCREME) 10 % cream Apply 1 Application topically as needed for muscle pain.    [provider]  vortioxetine HBr (TRINTELLIX) 10 MG TABS tablet Take 1 tablet (10 mg total) by mouth daily. 11/23/21   Nche, Bonna Gains, NP      Allergies    Patient has no known allergies.    Review of Systems   Review of Systems  Respiratory:  Negative for shortness of breath.   Cardiovascular:  Positive for chest pain.    Physical Exam Updated Vital Signs BP 110/71 (BP Location: Left Arm)   Pulse 76   Temp 99.9 F (37.7 C) (Oral)   Resp 18   SpO2 99%  Physical Exam Vitals and nursing note reviewed.  Constitutional:      General: He is not in acute distress.    Appearance: He is well-developed.  HENT:     Head: Normocephalic and atraumatic.     Right Ear: External ear normal.     Left Ear: External ear normal.  Eyes:     General: No scleral icterus.       Right eye: No discharge.        Left eye: No discharge.     Conjunctiva/sclera: Conjunctivae normal.  Neck:     Trachea: No tracheal deviation.  Cardiovascular:     Rate and Rhythm: Normal rate and regular rhythm.  Pulmonary:     Effort: Pulmonary effort is normal. No respiratory distress.     Breath sounds: Normal breath sounds. No stridor. No wheezing or rales.  Abdominal:     General: Bowel sounds are normal. There is no distension.     Palpations: Abdomen is soft.     Tenderness: There is no abdominal tenderness. There is no guarding or rebound.  Musculoskeletal:        General: No tenderness or deformity.     Cervical back: Neck supple.     Right lower leg: No tenderness. No edema.     Left lower leg: No tenderness. No edema.  Skin:    General: Skin is warm and dry.     Findings: No rash.  Neurological:     General: No focal deficit present.     Mental Status: He is alert.     Cranial Nerves: No cranial nerve deficit (no facial droop, extraocular movements intact, no  slurred speech).     Sensory: No sensory deficit.     Motor: No abnormal muscle tone or seizure activity.     Coordination: Coordination normal.  Psychiatric:        Mood and Affect: Mood normal.     ED Results / Procedures / Treatments   Labs (all labs ordered are listed, but only abnormal results are displayed) Labs Reviewed  BASIC METABOLIC PANEL - Abnormal; Notable  for the following components:      Result Value   Glucose, Bld 100 (*)    All other components within normal limits  RESP PANEL BY RT-PCR (FLU A&B, COVID) ARPGX2  CBC  D-DIMER, QUANTITATIVE  TROPONIN I (HIGH SENSITIVITY)  TROPONIN I (HIGH SENSITIVITY)    EKG EKG Interpretation  Date/Time:  Wednesday April 04 2022 16:49:44 EST Ventricular Rate:  74 PR Interval:  162 QRS Duration: 82 QT Interval:  362 QTC Calculation: 401 R Axis:   4 Text Interpretation: Normal sinus rhythm Nonspecific T wave abnormality Abnormal ECG When compared with ECG of 16-Mar-2022 15:10, Nonspecific T wave abnormality no longer evident in Anterior leads Confirmed by Linwood Dibbles [phone]) on 04/04/2022 5:05:14 PM  Radiology DG Chest 2 View  Result Date: 04/04/2022 CLINICAL DATA:  Chest pain. EXAM: CHEST - 2 VIEW COMPARISON:  July 31, 2020. FINDINGS: The heart size and mediastinal contours are within normal limits. Both lungs are clear. The visualized skeletal structures are unremarkable. IMPRESSION: No active cardiopulmonary disease. Electronically Signed   By: Lupita Raider M.D.   On: 04/04/2022 18:26    Procedures Procedures    Medications Ordered in ED Medications - No data to display  ED Course/ Medical Decision Making/ A&P Clinical Course as of 04/04/22 2328  Wed Apr 04, 2022  2313 CBC CBC normal [JK]  2313 DG Chest 2 View Chest x-ray without acute findings [JK]  2313 Basic metabolic panel(!) Normal [JK]  2314 Troponin I (High Sensitivity) Troponins normal [JK]  2314 D-dimer, quantitative Dimer negative [JK]     Clinical Course User Index [JK] Linwood Dibbles, MD                           Medical Decision Making Frontal diagnosis includes but not limited to acute coronary syndrome, pulmonary embolism, pneumonia, atypical chest pain  Problems Addressed: Chest pain, unspecified type: acute illness or injury that poses a threat to life or bodily functions  Amount and/or Complexity of Data Reviewed Labs: ordered. Decision-making details documented in ED Course. Radiology: ordered and independent interpretation performed. Decision-making details documented in ED Course.  Risk Prescription drug management.   Patient with pleuritic type chest pain.  Overall low risk heart score.  ED work-up reassuring.  Serial troponins normal.  Doubt acute cardiac etiology.  No pneumonia or pneumothorax on x-ray.  D-dimer is negative.  Doubt pulmonary embolism.  We will try a course of NSAIDs and antacids.  Outpatient follow-up with PCP.  Evaluation and diagnostic testing in the emergency department does not suggest an emergent condition requiring admission or immediate intervention beyond what has been performed at this time.  The patient is safe for discharge and has been instructed to return immediately for worsening symptoms, change in symptoms or any other concerns.        Final Clinical Impression(s) / ED Diagnoses Final diagnoses:  Chest pain, unspecified type    Rx / DC Orders ED Discharge Orders          Ordered    naproxen (NAPROSYN) 375 MG tablet  2 times daily        04/04/22 2327    pantoprazole (PROTONIX) 20 MG tablet  2 times daily        04/04/22 2327              Linwood Dibbles, MD 04/04/22 2328

## 2022-04-05 ENCOUNTER — Ambulatory Visit: Payer: Medicare HMO

## 2022-04-10 ENCOUNTER — Encounter: Payer: Medicare HMO | Admitting: Orthopaedic Surgery

## 2022-04-16 ENCOUNTER — Ambulatory Visit (INDEPENDENT_AMBULATORY_CARE_PROVIDER_SITE_OTHER): Payer: Medicare HMO | Admitting: *Deleted

## 2022-04-16 DIAGNOSIS — J4550 Severe persistent asthma, uncomplicated: Secondary | ICD-10-CM | POA: Diagnosis not present

## 2022-04-18 ENCOUNTER — Other Ambulatory Visit: Payer: Self-pay | Admitting: Nurse Practitioner

## 2022-04-18 DIAGNOSIS — F411 Generalized anxiety disorder: Secondary | ICD-10-CM

## 2022-05-02 ENCOUNTER — Telehealth: Payer: Self-pay | Admitting: Nurse Practitioner

## 2022-05-02 ENCOUNTER — Telehealth: Payer: Self-pay | Admitting: *Deleted

## 2022-05-02 ENCOUNTER — Telehealth: Payer: Medicare HMO | Admitting: Physician Assistant

## 2022-05-02 DIAGNOSIS — R000 Tachycardia, unspecified: Secondary | ICD-10-CM

## 2022-05-02 NOTE — Telephone Encounter (Signed)
Caller Name: Nikkolas Coomes Call back phone #: [phone]  Reason for Call: Pt's pulse is high, hoping for temporary prescription.

## 2022-05-02 NOTE — Telephone Encounter (Signed)
Called patient and advised MD appt needed for Nucala reapproval. Patient will call back to make same

## 2022-05-02 NOTE — Progress Notes (Signed)
Virtual Visit Consent   Jordan [name], you are scheduled for a virtual visit with a [facility] provider today. Just as with appointments in the office, your consent must be obtained to participate. Your consent will be active for this visit and any virtual visit you may have with one of our providers in the next 365 days. If you have a MyChart account, a copy of this consent can be sent to you electronically.  As this is a virtual visit, video technology does not allow for your provider to perform a traditional examination. This may limit your provider's ability to fully assess your condition. If your provider identifies any concerns that need to be evaluated in person or the need to arrange testing (such as labs, EKG, etc.), we will make arrangements to do so. Although advances in technology are sophisticated, we cannot ensure that it will always work on either your end or our end. If the connection with a video visit is poor, the visit may have to be switched to a telephone visit. With either a video or telephone visit, we are not always able to ensure that we have a secure connection.  By engaging in this virtual visit, you consent to the provision of healthcare and authorize for your insurance to be billed (if applicable) for the services provided during this visit. Depending on your insurance coverage, you may receive a charge related to this service.  I need to obtain your verbal consent now. Are you willing to proceed with your visit today? Jordan [name] has provided verbal consent on 05/02/2022 for a virtual visit (video or telephone). Jordan [name], [place]  Date: 05/02/2022 3:53 PM  Virtual Visit via Video Note   I, Jordan [name], connected with  Jordan [name]  (AI:[id number], Jan 16, 1976) on 05/02/22 at  3:45 PM EST by a video-enabled telemedicine application and verified that I am speaking with the correct person using two identifiers.  Location: Patient: Virtual Visit  Location Patient: Home Provider: Virtual Visit Location Provider: Home Office   I discussed the limitations of evaluation and management by telemedicine and the availability of in person appointments. The patient expressed understanding and agreed to proceed.    History of Present Illness: Jordan [name] is a 46 y.o. who identifies as a male who was assigned male at birth, and is being seen today for ongoing elevated heart rate. Notes this happens in intermittent episodes. Was evaluated recently at ER for this with overall negative workup. Has not followed up with his PCP. Notes he needs something for heart rate before his work physical tomorrow. Patient denies chest pain, lightheadedness, dizziness, vision changes or frequent headaches.   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 To be  taken after surgery to prevent blood clots, Disp: 84 tablet, Rfl: 0   docusate sodium (COLACE) 100 MG capsule, Take 1 capsule (100 mg total) by mouth daily as needed., Disp: 30 capsule, Rfl: 2   ondansetron (ZOFRAN) 4 MG tablet, Take 1 tablet (4 mg total) by mouth every 8 (eight) hours as needed for nausea or vomiting., Disp: 40 tablet, Rfl: 0   oxyCODONE-acetaminophen (PERCOCET) 5-325 MG tablet,  Take 1-2 tablets by mouth every 6 (six) hours as needed. To be taken after surgery, Disp: 40 tablet, Rfl: 0   albuterol (PROVENTIL) (2.5 MG/3ML) 0.083% nebulizer solution, Take 3 mLs (2.5 mg total) by nebulization every 4 (four) hours as needed for wheezing or shortness of breath., Disp: 75 mL, Rfl: 1   albuterol (VENTOLIN HFA) 108 (90 Base) MCG/ACT inhaler, Inhale 2 puffs into the lungs every 6 (six) hours as needed for wheezing or shortness of breath., Disp: 8 g, Rfl: 0   amLODipine (NORVASC) 10 MG tablet, Take 1 tablet (10 mg total) by mouth daily., Disp: 90 tablet, Rfl: 3   azelastine (ASTELIN) 0.1 % nasal spray, 2 sprays per nostril 2 times daily as needed for drainage., Disp: 30 mL, Rfl: 5   beclomethasone (QVAR REDIHALER) 80 MCG/ACT inhaler, Inhale 2 puffs into the lungs 2 (two) times daily. Rinse mouth out after each use. (Patient not taking: Reported on 03/13/2022), Disp: 10.6 g, Rfl: 5   Budeson-Glycopyrrol-Formoterol (BREZTRI AEROSPHERE) 160-9-4.8 MCG/ACT AERO, Inhale 2 puffs into the lungs in the morning and at bedtime., Disp: 10.7 g, Rfl: 5   cyclobenzaprine (FLEXERIL) 10 MG tablet, Take 1 tablet (10 mg total) by mouth 2 (two) times daily as needed for muscle spasms., Disp: 20 tablet, Rfl: 0   EPINEPHrine 0.3 mg/0.3 mL IJ SOAJ injection, INJECT 0.3 MLS INTO MUSCLE, Disp: 1 each, Rfl: 2   fenofibrate (TRICOR) 145 MG tablet, TAKE 1 TABLET(145 MG) BY MOUTH DAILY, Disp: 90 tablet, Rfl: 1   ipratropium (ATROVENT) 0.06 % nasal spray, Place 2 sprays in each nostril up to three times a day as needed, Disp: 15 mL, Rfl: 5   losartan (COZAAR) 25 MG tablet, Take 1 tablet (25 mg total) by mouth daily., Disp: 90 tablet, Rfl: 3   meloxicam (MOBIC) 15 MG tablet, Take 1 tablet (15 mg total) by mouth daily., Disp: 15 tablet, Rfl: 0   Mepolizumab (NUCALA) 100 MG/ML SOAJ, Inject 1 mL (100 mg total) into the skin every 28 (twenty-eight) days., Disp: 1 mL, Rfl: 11   montelukast (SINGULAIR) 10 MG tablet, TAKE 1  TABLET BY MOUTH EVERYDAY AT BEDTIME, Disp: 30 tablet, Rfl: 5   naproxen (NAPROSYN) 375 MG tablet, Take 1 tablet (375 mg total) by mouth 2 (two) times daily., Disp: 20 tablet, Rfl: 0   ondansetron (ZOFRAN) 4 MG tablet, Take 1 tablet (4 mg total) by mouth every 8 (eight) hours as needed for nausea or vomiting., Disp: 21 tablet, Rfl: 0   pantoprazole (PROTONIX) 20 MG tablet, Take 1 tablet (20 mg total) by mouth 2 (two) times daily., Disp: 60 tablet, Rfl: 3   traMADol (ULTRAM) 50 MG tablet, Take 1 tablet (50 mg total) by mouth 2 (two) times daily as needed., Disp: 30 tablet, Rfl: 0   trolamine salicylate (ASPERCREME) 10 % cream, Apply 1 Application topically as needed for muscle pain., Disp: , Rfl:    vortioxetine HBr (TRINTELLIX) 10 MG TABS tablet, Take 1 tablet (10 mg total) by mouth daily., Disp: 30 tablet, Rfl: 5  Current Facility-Administered  Medications:    Mepolizumab SOLR 100 mg, 100 mg, Subcutaneous, Q28 days, Marcelyn Bruins, MD, 100 mg at 04/16/22 1141  Observations/Objective: Patient is well-developed, well-nourished in no acute distress.  Resting comfortably at home.  Head is normocephalic, atraumatic.  No labored breathing. Speech is clear and coherent with logical content.  Patient is alert and oriented at baseline.   Assessment and Plan: 1. Tachycardia  Episodic per patient. Recent ER shows normal heart rate at times this was checked. He is already on CCB and ARB by his PCP. Needs in person follow-up with them. Discussed would not give a medication to use to mask his HR just for work exam tomorrow. Needs to contact PCP for further evaluation and management.   Follow Up Instructions: I discussed the assessment and treatment plan with the patient. The patient was provided an opportunity to ask questions and all were answered. The patient agreed with the plan and demonstrated an understanding of the instructions.  A copy of instructions were sent to the patient via MyChart  unless otherwise noted below.   The patient was advised to call back or seek an in-person evaluation if the symptoms worsen or if the condition fails to improve as anticipated.  Time:  I spent 10 minutes with the patient via telehealth technology discussing the above problems/concerns.    Piedad Climes, PA-C

## 2022-05-02 NOTE — Patient Instructions (Addendum)
Jordan [name], thank you for joining Piedad Climes, PA-C for today's virtual visit.  While this provider is not your primary care provider (PCP), if your PCP is located in our provider database this encounter information will be shared with them immediately following your visit.   A [facility] MyChart account gives you access to today's visit and all your visits, tests, and labs performed at [facility] " click here if you don't have a [facility] MyChart account or go to [email or web address]  Consent: (Patient) Jordan [name] provided verbal consent for this virtual visit at the beginning of the encounter.  Current Medications:  Current Outpatient Medications:    aspirin EC 81 MG tablet, Take 1 tablet (81 mg total) by mouth 2 (two) times daily. To be taken after surgery to prevent blood clots, Disp: 84 tablet, Rfl: 0   docusate sodium (COLACE) 100 MG capsule, Take 1 capsule (100 mg total) by mouth daily as needed., Disp: 30 capsule, Rfl: 2   ondansetron (ZOFRAN) 4 MG tablet, Take 1 tablet (4 mg total) by mouth every 8 (eight) hours as needed for nausea or vomiting., Disp: 40 tablet, Rfl: 0   oxyCODONE-acetaminophen (PERCOCET) 5-325 MG tablet, Take 1-2 tablets by mouth every 6 (six) hours as needed. To be taken after surgery, Disp: 40 tablet, Rfl: 0   albuterol (PROVENTIL) (2.5 MG/3ML) 0.083% nebulizer solution, Take 3 mLs (2.5 mg total) by nebulization every 4 (four) hours as needed for wheezing or shortness of breath., Disp: 75 mL, Rfl: 1   albuterol (VENTOLIN HFA) 108 (90 Base) MCG/ACT inhaler, Inhale 2 puffs into the lungs every 6 (six) hours as needed for wheezing or shortness of breath., Disp: 8 g, Rfl: 0   amLODipine (NORVASC) 10 MG tablet, Take 1 tablet (10 mg total) by mouth daily., Disp: 90 tablet, Rfl: 3   azelastine (ASTELIN) 0.1 % nasal spray, 2 sprays per nostril 2 times daily as needed for drainage., Disp: 30 mL, Rfl: 5   beclomethasone (QVAR REDIHALER)  80 MCG/ACT inhaler, Inhale 2 puffs into the lungs 2 (two) times daily. Rinse mouth out after each use. (Patient not taking: Reported on 03/13/2022), Disp: 10.6 g, Rfl: 5   Budeson-Glycopyrrol-Formoterol (BREZTRI AEROSPHERE) 160-9-4.8 MCG/ACT AERO, Inhale 2 puffs into the lungs in the morning and at bedtime., Disp: 10.7 g, Rfl: 5   cyclobenzaprine (FLEXERIL) 10 MG tablet, Take 1 tablet (10 mg total) by mouth 2 (two) times daily as needed for muscle spasms., Disp: 20 tablet, Rfl: 0   EPINEPHrine 0.3 mg/0.3 mL IJ SOAJ injection, INJECT 0.3 MLS INTO MUSCLE, Disp: 1 each, Rfl: 2   fenofibrate (TRICOR) 145 MG tablet, TAKE 1 TABLET(145 MG) BY MOUTH DAILY, Disp: 90 tablet, Rfl: 1   ipratropium (ATROVENT) 0.06 % nasal spray, Place 2 sprays in each nostril up to three times a day as needed, Disp: 15 mL, Rfl: 5   losartan (COZAAR) 25 MG tablet, Take 1 tablet (25 mg total) by mouth daily., Disp: 90 tablet, Rfl: 3   meloxicam (MOBIC) 15 MG tablet, Take 1 tablet (15 mg total) by mouth daily., Disp: 15 tablet, Rfl: 0   Mepolizumab (NUCALA) 100 MG/ML SOAJ, Inject 1 mL (100 mg total) into the skin every 28 (twenty-eight) days., Disp: 1 mL, Rfl: 11   montelukast (SINGULAIR) 10 MG tablet, TAKE 1 TABLET BY MOUTH EVERYDAY AT BEDTIME, Disp: 30 tablet, Rfl: 5   naproxen (NAPROSYN) 375 MG tablet, Take 1 tablet (375 mg total) by mouth  2 (two) times daily., Disp: 20 tablet, Rfl: 0   ondansetron (ZOFRAN) 4 MG tablet, Take 1 tablet (4 mg total) by mouth every 8 (eight) hours as needed for nausea or vomiting., Disp: 21 tablet, Rfl: 0   pantoprazole (PROTONIX) 20 MG tablet, Take 1 tablet (20 mg total) by mouth 2 (two) times daily., Disp: 60 tablet, Rfl: 3   traMADol (ULTRAM) 50 MG tablet, Take 1 tablet (50 mg total) by mouth 2 (two) times daily as needed., Disp: 30 tablet, Rfl: 0   trolamine salicylate (ASPERCREME) 10 % cream, Apply 1 Application topically as needed for muscle pain., Disp: , Rfl:    vortioxetine HBr (TRINTELLIX)  10 MG TABS tablet, Take 1 tablet (10 mg total) by mouth daily., Disp: 30 tablet, Rfl: 5  Current Facility-Administered Medications:    Mepolizumab SOLR 100 mg, 100 mg, Subcutaneous, Q28 days, Marcelyn Bruins, MD, 100 mg at 04/16/22 1141   Medications ordered in this encounter:  No orders of the defined types were placed in this encounter.    *If you need refills on other medications prior to your next appointment, please contact your pharmacy*  Follow-Up: Call back or seek an in-person evaluation if the symptoms worsen or if the condition fails to improve as anticipated.  [facility] [phone]  Other Instructions Contact your primary care provider for further evaluation as discussed giving these ongoing symptoms and negative recent ER evaluation.    If you have been instructed to have an in-person evaluation today at a local Urgent Care facility, please use the link below. It will take you to a list of all of our available [facility], including address, phone number and hours of operation. Please do not delay care.  [facility]  If you or a family member do not have a primary care provider, use the link below to schedule a visit and establish care. When you choose a [facility] primary care physician or advanced practice provider, you gain a long-term partner in health. Find a Primary Care Provider  Learn more about [facility]'s in-office and virtual care options:  - Get Care Now

## 2022-05-03 NOTE — Telephone Encounter (Signed)
VM full, MyChart message sent. Teams message sent to KO for override.

## 2022-05-15 ENCOUNTER — Ambulatory Visit: Payer: Medicare HMO

## 2022-05-22 ENCOUNTER — Other Ambulatory Visit: Payer: Self-pay | Admitting: Family

## 2022-05-22 DIAGNOSIS — J45901 Unspecified asthma with (acute) exacerbation: Secondary | ICD-10-CM

## 2022-06-04 ENCOUNTER — Telehealth: Payer: Self-pay

## 2022-06-04 NOTE — Telephone Encounter (Signed)
Patient called wondering if we could give him a Nucala sample the day of his appointment with Dr. Simona Huh. (06/13/22) Please advise.

## 2022-06-05 ENCOUNTER — Other Ambulatory Visit: Payer: Self-pay | Admitting: Nurse Practitioner

## 2022-06-05 ENCOUNTER — Telehealth: Payer: Self-pay | Admitting: Allergy

## 2022-06-05 DIAGNOSIS — J45901 Unspecified asthma with (acute) exacerbation: Secondary | ICD-10-CM

## 2022-06-05 NOTE — Telephone Encounter (Signed)
Forwarding message to provider for next step. 

## 2022-06-05 NOTE — Telephone Encounter (Signed)
Requested by interface surescripts. Provider not at this practice.  Requested Prescriptions  Refused Prescriptions Disp Refills   albuterol (VENTOLIN HFA) 108 (90 Base) MCG/ACT inhaler [Pharmacy Med Name: ALBUTEROL HFA INH(200 PUFFS) 18GM] 18 g     Sig: INHALE 1 TO 2 PUFFS INTO THE LUNGS EVERY 6 HOURS AS NEEDED FOR WHEEZING OR SHORTNESS OF BREATH     Pulmonology:  Beta Agonists 2 Failed - 06/05/2022  4:06 PM      Failed - Last BP in normal range    BP Readings from Last 1 Encounters:  04/04/22 (!) 133/94         Failed - Valid encounter within last 12 months    Recent Outpatient Visits   None     Future Appointments             In 6 days Garnet Sierras, DO [facility] [place]   In 6 days Rudd, Lillette Boxer, MD [facility] [place] in normal range    Pulse Readings from Last 1 Encounters:  04/04/22 87

## 2022-06-05 NOTE — Telephone Encounter (Signed)
Patient states his insurance will no longer cover the prescription that our office sent in originally. Patient was told by pharmacist that our office can send in a prescription for 40mg  instead so the insurance will cover medication and patient can break it in half.   Please advise on adjusting prescription.  [place] [address]  Best contact number: [phone]

## 2022-06-07 MED ORDER — PANTOPRAZOLE SODIUM 40 MG PO TBEC: 40.0000 mg | DELAYED_RELEASE_TABLET | Freq: Every day | ORAL | 0 refills | Status: DC

## 2022-06-07 NOTE — Telephone Encounter (Signed)
Per provider - That's fine to send in pantoprazole 40 mg.  Called patient - DOB/Pharmacy verified - advised of provider notation above. Patient is aware he must keep office appt.  - 06/11/22 @ 10:30 am w/ Dr. Maudie Mercury for future medication refills.  Patient verbalized understanding, no further questions.

## 2022-06-07 NOTE — Telephone Encounter (Signed)
I called the patient and he verbalized understanding that he needs an office visit first before being able to restart Nucala Injections.

## 2022-06-10 NOTE — Progress Notes (Unsigned)
Follow Up Note  RE: Jordan [name] MRN: [id number] DOB: [birth year] Date of Office Visit: 06/11/2022  Referring provider: Flossie Buffy, NP Primary care provider: Flossie Buffy, NP  Chief Complaint: Other (Pt wants to restart nucala the patient states he still having problems with stuffness and short of breath,) and asthma (Pt states having trouble breathing.)  History of Present Illness: I had the pleasure of seeing Jordan [name] for a follow up visit at the [facility] on 06/11/2022. He is a 47 y.o. male, who is being followed for asthma on Nucala, allergic rhinoconjunctivitis, heartburn, recurrent infections. His previous allergy office visit was on 10/19/2021 with Dr. Nelva Bush. Today is a regular follow up visit.  Asthma Patient's last Nucala injection was on 04/16/2022. He didn't come to get injections because he was not feeling well. He also noted that they were not lasting the full 4 weeks now.  Complaining of some shortness of breath and coughing at night. Patient ran out of Breztri and Qvar a few weeks ago - did not get it filled. Apparently he had some issues with refilling? He also ran out of Singulair.   The only inhaler he has is albuterol and has been using it 2 puffs every 1 hour for 6 weeks - discussed with patient that this can make the inhaler not as effective and can cause tachycardia. He is not even sure if it helps anymore but he doesn't know what else to do when he has these episodes so he just uses the albuterol. Patient had prednisone 1 month ago with no benefit at all.   Seasonal and perennial allergic rhinoconjunctivitis Currently taking ipratropium 3 times a day with no benefit. Taking sudafed twice a day as needed for 1 month.  Denies epistaxis.   Heartburn Not taking any meds for this.    Assessment and Plan: Jordan [name] is a 47 y.o. male with: Not well controlled severe persistent asthma Patient has been non-compliant with  his therapy. Last Nucala injection on 04/16/2022 and ran out of his controller inhalers a few weeks ago. Now using albuterol 2 puffs every hour and not even sure if it's helping. Prednisone last month with no benefit.  Today's spirometry was not interpretable due to poor effort. Stressed the importance to medical compliance and NOT overusing albuterol especially if it's not even effective.  Nucala given today - continue every 4 weeks. You can get it even when you are sick. There was discussion with Dr. Nelva Bush who is his main allergist regarding trying Tezspire as Jordan [name] seems to be not as effective anymore.  Daily controller medication(s):   RESTART Breztri 2 puffs twice a day and Qvar 63mcg 2 puffs twice a day with spacer and rinse mouth afterwards.   RESTART Singulair 10mg  daily at night.  Samples of Breztri given and Rx sent for all his inhalers.  During respiratory infections/flares:  Pretreat with albuterol 2 puffs or albuterol nebulizer.  If you need to use your albuterol nebulizer machine back to back within 15-30 minutes with no relief then please go to the ER/urgent care for further evaluation.  May use albuterol rescue inhaler 2 puffs or nebulizer every 4 to 6 hours as needed for shortness of breath, chest tightness, coughing, and wheezing. May use albuterol rescue inhaler 2 puffs 5 to 15 minutes prior to strenuous physical activities. Monitor frequency of use.  Get spirometry at next visit.  Seasonal and perennial allergic rhinoconjunctivitis Past history - 2021  skin testing was positive to grass, tree, mold, dust mites. Interim history - nasal congestion and taking sudafed. Use Nasacort (triamcinolone) nasal spray 1 spray per nostril twice a day as needed for nasal congestion. Sample given.  Continue with nasal Atrovent 2 sprays each nostril up to 3-4 times a day as needed for nasal congestion and drainage. Nasal saline spray (i.e., Simply Saline) or nasal saline lavage (i.e.,  NeilMed) is recommended as needed and prior to medicated nasal sprays. May take an over the counter antihistamine such as Claritin, Allegra, Xyzal, or Zyrtec once a day as needed.  Continue Singulair 10mg  nightly. Stop sudafed.  Gastroesophageal reflux disease Not taking medication as he ran out. Take pantoprazole 20mg  in the morning. Nothing to eat or drink for 30 minutes.  Recurrent infections Keep track of current infections and number of antibiotics needed Consider getting basic immune bloodwork at next visit.   Return in about 1 month (around 07/12/2022).  Meds ordered this encounter  Medications   montelukast (SINGULAIR) 10 MG tablet    Sig: Take 1 tablet (10 mg total) by mouth at bedtime.    Dispense:  30 tablet    Refill:  3   albuterol (VENTOLIN HFA) 108 (90 Base) MCG/ACT inhaler    Sig: Inhale 2 puffs into the lungs every 4 (four) hours as needed for wheezing or shortness of breath (coughing fits).    Dispense:  18 g    Refill:  0   albuterol (PROVENTIL) (2.5 MG/3ML) 0.083% nebulizer solution    Sig: Take 3 mLs (2.5 mg total) by nebulization every 4 (four) hours as needed for wheezing or shortness of breath (coughing fits).    Dispense:  75 mL    Refill:  1   Budeson-Glycopyrrol-Formoterol (BREZTRI AEROSPHERE) 160-9-4.8 MCG/ACT AERO    Sig: Inhale 2 puffs into the lungs in the morning and at bedtime. with spacer and rinse mouth afterwards.    Dispense:  10.7 g    Refill:  3   beclomethasone (QVAR REDIHALER) 80 MCG/ACT inhaler    Sig: Inhale 2 puffs into the lungs 2 (two) times daily. Rinse mouth after each use.    Dispense:  1 each    Refill:  3   Lab Orders  No laboratory test(s) ordered today    Diagnostics: Spirometry:  Tracings reviewed. His effort: Poor effort, data can not be interpreted. Please see scanned spirometry results for details.  Medication List:  Current Outpatient Medications  Medication Sig Dispense Refill   albuterol (PROVENTIL) (2.5  MG/3ML) 0.083% nebulizer solution Take 3 mLs (2.5 mg total) by nebulization every 4 (four) hours as needed for wheezing or shortness of breath (coughing fits). 75 mL 1   albuterol (VENTOLIN HFA) 108 (90 Base) MCG/ACT inhaler Inhale 2 puffs into the lungs every 4 (four) hours as needed for wheezing or shortness of breath (coughing fits). 18 g 0   amLODipine (NORVASC) 10 MG tablet Take 1 tablet (10 mg total) by mouth daily. 90 tablet 3   aspirin EC 81 MG tablet Take 1 tablet (81 mg total) by mouth 2 (two) times daily. To be taken after surgery to prevent blood clots 84 tablet 0   beclomethasone (QVAR REDIHALER) 80 MCG/ACT inhaler Inhale 2 puffs into the lungs 2 (two) times daily. Rinse mouth after each use. 1 each 3   Budeson-Glycopyrrol-Formoterol (BREZTRI AEROSPHERE) 160-9-4.8 MCG/ACT AERO Inhale 2 puffs into the lungs in the morning and at bedtime. with spacer and rinse mouth afterwards. 10.7 g  3   cyclobenzaprine (FLEXERIL) 10 MG tablet Take 1 tablet (10 mg total) by mouth 2 (two) times daily as needed for muscle spasms. 20 tablet 0   docusate sodium (COLACE) 100 MG capsule Take 1 capsule (100 mg total) by mouth daily as needed. 30 capsule 2   EPINEPHrine 0.3 mg/0.3 mL IJ SOAJ injection INJECT 0.3 MLS INTO MUSCLE 1 each 2   fenofibrate (TRICOR) 145 MG tablet TAKE 1 TABLET(145 MG) BY MOUTH DAILY 90 tablet 1   ipratropium (ATROVENT) 0.06 % nasal spray Place 2 sprays in each nostril up to three times a day as needed 15 mL 5   losartan (COZAAR) 25 MG tablet Take 1 tablet (25 mg total) by mouth daily. 90 tablet 3   meloxicam (MOBIC) 15 MG tablet Take 1 tablet (15 mg total) by mouth daily. 15 tablet 0   Mepolizumab (NUCALA) 100 MG/ML SOAJ Inject 1 mL (100 mg total) into the skin every 28 (twenty-eight) days. 1 mL 11   montelukast (SINGULAIR) 10 MG tablet Take 1 tablet (10 mg total) by mouth at bedtime. 30 tablet 3   naproxen (NAPROSYN) 375 MG tablet Take 1 tablet (375 mg total) by mouth 2 (two) times  daily. 20 tablet 0   ondansetron (ZOFRAN) 4 MG tablet Take 1 tablet (4 mg total) by mouth every 8 (eight) hours as needed for nausea or vomiting. 21 tablet 0   ondansetron (ZOFRAN) 4 MG tablet Take 1 tablet (4 mg total) by mouth every 8 (eight) hours as needed for nausea or vomiting. 40 tablet 0   oxyCODONE-acetaminophen (PERCOCET) 5-325 MG tablet Take 1-2 tablets by mouth every 6 (six) hours as needed. To be taken after surgery 40 tablet 0   pantoprazole (PROTONIX) 40 MG tablet Take 1 tablet (40 mg total) by mouth daily. 30 tablet 0   traMADol (ULTRAM) 50 MG tablet Take 1 tablet (50 mg total) by mouth 2 (two) times daily as needed. 30 tablet 0   trolamine salicylate (ASPERCREME) 10 % cream Apply 1 Application topically as needed for muscle pain.     vortioxetine HBr (TRINTELLIX) 10 MG TABS tablet Take 1 tablet (10 mg total) by mouth daily. 30 tablet 5   Current Facility-Administered Medications  Medication Dose Route Frequency Provider Last Rate Last Admin   Mepolizumab SOLR 100 mg  100 mg Subcutaneous Q28 days Kennith Gain, MD   100 mg at 06/11/22 1043   Allergies: No Known Allergies I reviewed his past medical history, social history, family history, and environmental history and no significant changes have been reported from his previous visit.  Review of Systems  Constitutional:  Negative for appetite change, chills, fever and unexpected weight change.  HENT:  Positive for congestion. Negative for rhinorrhea.   Eyes:  Negative for itching.  Respiratory:  Positive for cough, chest tightness and shortness of breath. Negative for wheezing.   Gastrointestinal:  Negative for abdominal pain.  Skin:  Negative for rash.  Allergic/Immunologic: Positive for environmental allergies.  Neurological:  Positive for headaches.    Objective: BP 132/72   Pulse 87   Temp 97.7 F (36.5 C)   Resp 20   Ht 5\' 9"  (1.753 m)   Wt 178 lb 4.8 oz (80.9 kg)   SpO2 98%   BMI 26.33 kg/m   Body mass index is 26.33 kg/m. Physical Exam Vitals and nursing note reviewed.  Constitutional:      Appearance: Normal appearance. He is well-developed.  HENT:  Head: Normocephalic and atraumatic.     Right Ear: Tympanic membrane and external ear normal.     Left Ear: Tympanic membrane and external ear normal.     Nose: Congestion (on right side) present.     Mouth/Throat:     Mouth: Mucous membranes are moist.     Pharynx: Oropharynx is clear.  Eyes:     Conjunctiva/sclera: Conjunctivae normal.  Cardiovascular:     Rate and Rhythm: Normal rate and regular rhythm.     Heart sounds: Normal heart sounds. No murmur heard. Pulmonary:     Effort: Pulmonary effort is normal.     Breath sounds: Normal breath sounds. No wheezing, rhonchi or rales.  Musculoskeletal:     Cervical back: Neck supple.  Skin:    General: Skin is warm.     Findings: No rash.  Neurological:     Mental Status: He is alert and oriented to person, place, and time.  Psychiatric:        Behavior: Behavior normal.    Previous notes and tests were reviewed. The plan was reviewed with the patient/family, and all questions/concerned were addressed.  It was my pleasure to see Jordan [name] today and participate in his care. Please feel free to contact me with any questions or concerns.  Sincerely,  Wyline Mood, DO Allergy & Immunology  [facility] office: [phone] [facility] office: [phone]

## 2022-06-11 ENCOUNTER — Telehealth: Payer: Self-pay | Admitting: Nurse Practitioner

## 2022-06-11 ENCOUNTER — Ambulatory Visit (INDEPENDENT_AMBULATORY_CARE_PROVIDER_SITE_OTHER): Payer: Medicare HMO | Admitting: Allergy

## 2022-06-11 ENCOUNTER — Other Ambulatory Visit: Payer: Self-pay

## 2022-06-11 ENCOUNTER — Ambulatory Visit: Payer: Medicare HMO | Admitting: Family Medicine

## 2022-06-11 ENCOUNTER — Encounter: Payer: Self-pay | Admitting: Allergy

## 2022-06-11 ENCOUNTER — Telehealth: Payer: Self-pay | Admitting: *Deleted

## 2022-06-11 VITALS — BP 132/72 | HR 87 | Temp 97.7°F | Resp 20 | Ht 69.0 in | Wt 178.3 lb

## 2022-06-11 DIAGNOSIS — J302 Other seasonal allergic rhinitis: Secondary | ICD-10-CM

## 2022-06-11 DIAGNOSIS — Z91199 Patient's noncompliance with other medical treatment and regimen due to unspecified reason: Secondary | ICD-10-CM | POA: Diagnosis not present

## 2022-06-11 DIAGNOSIS — H1013 Acute atopic conjunctivitis, bilateral: Secondary | ICD-10-CM | POA: Diagnosis not present

## 2022-06-11 DIAGNOSIS — B999 Unspecified infectious disease: Secondary | ICD-10-CM | POA: Insufficient documentation

## 2022-06-11 DIAGNOSIS — J4550 Severe persistent asthma, uncomplicated: Secondary | ICD-10-CM | POA: Diagnosis not present

## 2022-06-11 DIAGNOSIS — K219 Gastro-esophageal reflux disease without esophagitis: Secondary | ICD-10-CM | POA: Insufficient documentation

## 2022-06-11 MED ORDER — ALBUTEROL SULFATE HFA 108 (90 BASE) MCG/ACT IN AERS: 2.0000 | INHALATION_SPRAY | RESPIRATORY_TRACT | 0 refills | Status: DC | PRN

## 2022-06-11 MED ORDER — MONTELUKAST SODIUM 10 MG PO TABS: 10.0000 mg | ORAL_TABLET | Freq: Every day | ORAL | 3 refills | Status: DC

## 2022-06-11 MED ORDER — ALBUTEROL SULFATE (2.5 MG/3ML) 0.083% IN NEBU: 2.5000 mg | INHALATION_SOLUTION | RESPIRATORY_TRACT | 1 refills | Status: DC | PRN

## 2022-06-11 MED ORDER — BREZTRI AEROSPHERE 160-9-4.8 MCG/ACT IN AERO: 2.0000 | INHALATION_SPRAY | Freq: Two times a day (BID) | RESPIRATORY_TRACT | 3 refills | Status: DC

## 2022-06-11 MED ORDER — QVAR REDIHALER 80 MCG/ACT IN AERB: 2.0000 | INHALATION_SPRAY | Freq: Two times a day (BID) | RESPIRATORY_TRACT | 3 refills | Status: DC

## 2022-06-11 NOTE — Assessment & Plan Note (Signed)
Not taking medication as he ran out. Take pantoprazole 20mg  in the morning. Nothing to eat or drink for 30 minutes.

## 2022-06-11 NOTE — Telephone Encounter (Signed)
Pt was a no show for an OV with Dr Gena Fray on 06/11/22, I sent a letter. He called in at noon stating he had to take his daughter to her doctor.

## 2022-06-11 NOTE — Telephone Encounter (Signed)
Can you do PA please?  Thank you.

## 2022-06-11 NOTE — Telephone Encounter (Signed)
Received a fax from the pharmacy stating that QVAR is not covered on the patients plan. It states that Arnuity is preferred alternative, would you like to switch or initiate a PA for QVAR?

## 2022-06-11 NOTE — Patient Instructions (Addendum)
Asthma:  Make sure you take your daily controller medications! Do NOT use albuterol more than every 4 hours as it can lessen its effectiveness. Albuterol can also make your heart race. Nucala given today - continue every 4 weeks. You can get it even when you are sick.  Daily controller medication(s):   RESTART Breztri 2 puffs twice a day and Qvar 46mcg 2 puffs twice a day with spacer and rinse mouth afterwards.   RESTART Singulair 10mg  daily at night.  During respiratory infections/flares:  Pretreat with albuterol 2 puffs or albuterol nebulizer.  If you need to use your albuterol nebulizer machine back to back within 15-30 minutes with no relief then please go to the ER/urgent care for further evaluation.  May use albuterol rescue inhaler 2 puffs or nebulizer every 4 to 6 hours as needed for shortness of breath, chest tightness, coughing, and wheezing. May use albuterol rescue inhaler 2 puffs 5 to 15 minutes prior to strenuous physical activities. Monitor frequency of use.  Breathing control goals:  Full participation in all desired activities (may need albuterol before activity) Albuterol use two times or less a week on average (not counting use with activity) Cough interfering with sleep two times or less a month Oral steroids no more than once a year No hospitalizations   Seasonal and perennial allergic rhinoconjunctivitis Past history - 2021 skin testing was positive to grass, tree, mold, dust mites. Use Nasacort (triamcinolone) nasal spray 1 spray per nostril twice a day as needed for nasal congestion. Sample given.  Continue with nasal Atrovent 2 sprays each nostril up to 3-4 times a day as needed for nasal congestion and drainage. Nasal saline spray (i.e., Simply Saline) or nasal saline lavage (i.e., NeilMed) is recommended as needed and prior to medicated nasal sprays. May take an over the counter antihistamine such as Claritin, Allegra, Xyzal, or Zyrtec once a day as needed.   Continue Singulair 10mg  nightly. Stop sudafed.  Heartburn Take pantoprazole 20mg  in the morning. Nothing to eat or drink for 30 minutes.  Recurrent infections Keep track of current infections and number of antibiotics needed  Follow up in 4 weeks with DR. PADGETT TO discuss changing his injectable for asthma.

## 2022-06-11 NOTE — Assessment & Plan Note (Signed)
Patient has been non-compliant with his therapy. Last Nucala injection on 04/16/2022 and ran out of his controller inhalers a few weeks ago. Now using albuterol 2 puffs every hour and not even sure if it's helping. Prednisone last month with no benefit.  Today's spirometry was not interpretable due to poor effort. Stressed the importance to medical compliance and NOT overusing albuterol especially if it's not even effective.  Nucala given today - continue every 4 weeks. You can get it even when you are sick. There was discussion with Dr. Nelva Bush who is his main allergist regarding trying Tezspire as Geradine Girt seems to be not as effective anymore.  Daily controller medication(s):   RESTART Breztri 2 puffs twice a day and Qvar 73mcg 2 puffs twice a day with spacer and rinse mouth afterwards.   RESTART Singulair 10mg  daily at night.  Samples of Breztri given and Rx sent for all his inhalers.  During respiratory infections/flares:  Pretreat with albuterol 2 puffs or albuterol nebulizer.  If you need to use your albuterol nebulizer machine back to back within 15-30 minutes with no relief then please go to the ER/urgent care for further evaluation.  May use albuterol rescue inhaler 2 puffs or nebulizer every 4 to 6 hours as needed for shortness of breath, chest tightness, coughing, and wheezing. May use albuterol rescue inhaler 2 puffs 5 to 15 minutes prior to strenuous physical activities. Monitor frequency of use.  Get spirometry at next visit.

## 2022-06-11 NOTE — Assessment & Plan Note (Signed)
Past history - 2021 skin testing was positive to grass, tree, mold, dust mites. Interim history - nasal congestion and taking sudafed. Use Nasacort (triamcinolone) nasal spray 1 spray per nostril twice a day as needed for nasal congestion. Sample given.  Continue with nasal Atrovent 2 sprays each nostril up to 3-4 times a day as needed for nasal congestion and drainage. Nasal saline spray (i.e., Simply Saline) or nasal saline lavage (i.e., NeilMed) is recommended as needed and prior to medicated nasal sprays. May take an over the counter antihistamine such as Claritin, Allegra, Xyzal, or Zyrtec once a day as needed.  Continue Singulair 10mg  nightly. Stop sudafed.

## 2022-06-11 NOTE — Assessment & Plan Note (Signed)
Keep track of current infections and number of antibiotics needed Consider getting basic immune bloodwork at next visit.

## 2022-06-12 ENCOUNTER — Telehealth: Payer: Self-pay

## 2022-06-12 ENCOUNTER — Other Ambulatory Visit (HOSPITAL_COMMUNITY): Payer: Self-pay

## 2022-06-12 NOTE — Telephone Encounter (Signed)
PA has been completed, awaiting determination, will update in additional encounter.

## 2022-06-12 NOTE — Telephone Encounter (Signed)
PA request received from providers office for Qvar RediHaler 80MCG/ACT aerosol  Patient has previously tried Theme park manager and Librarian, academic, currently on [place].   PA has been submitted via CMM to [facility] and is pending determination.   Key: [id number]

## 2022-06-13 ENCOUNTER — Ambulatory Visit: Payer: Medicare HMO | Admitting: Internal Medicine

## 2022-06-14 MED ORDER — ARNUITY ELLIPTA 100 MCG/ACT IN AEPB: 1.0000 | INHALATION_SPRAY | Freq: Every day | RESPIRATORY_TRACT | 1 refills | Status: DC

## 2022-06-14 NOTE — Telephone Encounter (Signed)
Please call patient and let him know that Qvar no longer covered.  Sent in Arnuity 114mcg 1 puff once a day to use. This is in addition to his Breztri 2 puffs twice a day.  Make sure he keeps his appointment with Dr. Nelva Bush next month.

## 2022-06-14 NOTE — Telephone Encounter (Signed)
same day cancel/not counted as no show, called at 12:00p and said he had to take daughter to the doctor

## 2022-06-14 NOTE — Telephone Encounter (Signed)
Patient Advocate Encounter  Received a fax from [facility] regarding Prior Authorization for [place] 80MCG/ACT aerosol.   Authorization has been DENIED due to We denied this request under Medicare Part D because: The requested drug is not on your plan's formulary (list of covered drugs). Your Medicare art D drug plan was asked to cover a drug that is not on the formulary (this is called a formulary exception). Your prescriber did not provide the detailed information that is required In order to approve the request. To receive a formulary exception, per the Part D Coverage Determination guidance Section 40.5 .2 and 40.5.3, your prescriber must provide the information that documents at least one of the following has occurred;  - You have tried the formulary drugs for the treatment of your condition and they did not work for you.  OR - The formulary drugs could cause adverse effects. OR - The formulary drugs would be less effective for your condition than the requested drug.  Talk to your prescriber to see if the following covered alternative(s) would be right for you: Budesonide suspension (requires prior authorization) Arnuity ellipta Inhaler (quantity limit of 30 doses every 30 days) Flovent Diskus inhaler (Different quantity limits apply depending on the strength of the medication prescribed. Please consult the Medicare Part D plan's formulary for the specific quantity limit). Flovent HFA inhaler (Different quantity limits apply depending on the strength of the medication prescribed. Please consult the Medicare Part D plan's formulary for the specific quantity limit)

## 2022-06-14 NOTE — Telephone Encounter (Signed)
Called patient - DOB verified - advised of provider notation below.  Patient verbalized understanding, no further questions. 

## 2022-06-14 NOTE — Addendum Note (Signed)
Addended by: Garnet Sierras on: 06/14/2022 04:49 PM   Modules accepted: Orders

## 2022-06-15 ENCOUNTER — Ambulatory Visit: Payer: Medicare HMO | Admitting: Nurse Practitioner

## 2022-07-03 ENCOUNTER — Ambulatory Visit: Payer: Medicare HMO | Admitting: Nurse Practitioner

## 2022-07-10 ENCOUNTER — Ambulatory Visit: Payer: Medicare HMO

## 2022-07-12 ENCOUNTER — Ambulatory Visit: Payer: Medicare HMO | Admitting: Allergy

## 2022-07-12 ENCOUNTER — Ambulatory Visit: Payer: Medicare HMO

## 2022-07-14 ENCOUNTER — Other Ambulatory Visit: Payer: Self-pay | Admitting: Allergy

## 2022-07-16 ENCOUNTER — Encounter: Payer: Self-pay | Admitting: Nurse Practitioner

## 2022-07-16 ENCOUNTER — Ambulatory Visit (INDEPENDENT_AMBULATORY_CARE_PROVIDER_SITE_OTHER): Payer: Medicare HMO | Admitting: Nurse Practitioner

## 2022-07-16 VITALS — BP 128/80 | HR 81 | Temp 98.6°F | Resp 16 | Ht 68.0 in | Wt 178.8 lb

## 2022-07-16 DIAGNOSIS — F332 Major depressive disorder, recurrent severe without psychotic features: Secondary | ICD-10-CM

## 2022-07-16 DIAGNOSIS — I10 Essential (primary) hypertension: Secondary | ICD-10-CM | POA: Diagnosis not present

## 2022-07-16 DIAGNOSIS — M87051 Idiopathic aseptic necrosis of right femur: Secondary | ICD-10-CM | POA: Diagnosis not present

## 2022-07-16 DIAGNOSIS — E781 Pure hyperglyceridemia: Secondary | ICD-10-CM

## 2022-07-16 DIAGNOSIS — R69 Illness, unspecified: Secondary | ICD-10-CM | POA: Diagnosis not present

## 2022-07-16 DIAGNOSIS — Z23 Encounter for immunization: Secondary | ICD-10-CM | POA: Diagnosis not present

## 2022-07-16 MED ORDER — FENOFIBRATE 145 MG PO TABS: ORAL_TABLET | ORAL | 1 refills | Status: AC

## 2022-07-16 MED ORDER — ESCITALOPRAM OXALATE 10 MG PO TABS: 10.0000 mg | ORAL_TABLET | Freq: Every day | ORAL | 0 refills | Status: DC

## 2022-07-16 MED ORDER — BUPROPION HCL ER (XL) 150 MG PO TB24: 150.0000 mg | ORAL_TABLET | Freq: Every day | ORAL | 0 refills | Status: DC

## 2022-07-16 NOTE — Assessment & Plan Note (Addendum)
Followed by [facility] [place] [facility] Current use of tramadol and nsaids for pain management

## 2022-07-16 NOTE — Patient Instructions (Signed)
Stop trintellix Start wellbutrin and lexapro Avoid use of ibuprofen or naproxen or meloxicam while taking lexapro. Maintain upcoming appt with psychiatry Monitor BP at home in Am Send BP reading via mychart

## 2022-07-16 NOTE — Progress Notes (Signed)
Established Patient Visit  Patient: Jordan [name]   DOB: 1976/05/04   47 y.o. Male  MRN: [id number] Visit Date: 07/16/2022  Subjective:    Chief Complaint  Patient presents with   Medication Refill   Medical Management of Chronic Issues    HTN-  Medication change from Trintellex back to what he was on before. Bupropion    Hypertension Reports he did not take BP meds today. States he does not skip med doses. BP Readings from Last 3 Encounters:  07/16/22 128/80  06/11/22 132/72  04/04/22 (!) 133/94    Maintain med doses  Depression, recurrent (HCC) Reports mood swings with trintellix. He wishes to resume wellbutrin and lexapro. He missed 2 appts with [facility], hence unable to schedule another appt. Reports he has an upcoming appt with [facility] in 47month Denies any hallucination or SI or HI No tobacco or ETOH or marijuana use or illicit drug use.  Stop trintellix Resume Wellbutrin 1582mXL and lexapro 1059maily F/up with psychiatry     07/16/2022    1:19 PM 01/25/2022    2:09 PM 11/23/2021    1:50 PM  Depression screen PHQ 2/9  Decreased Interest 3 3 2  $ Down, Depressed, Hopeless 3 3 2  $ PHQ - 2 Score 6 6 4  $ Altered sleeping 3 3 3  $ Tired, decreased energy 3 3 2  $ Change in appetite 1 0 1  Feeling bad or failure about yourself  3 3 3  $ Trouble concentrating 3 3 3  $ Moving slowly or fidgety/restless 3 3 3  $ Suicidal thoughts 0 0 0  PHQ-9 Score 22 21 19  $ Difficult doing work/chores Extremely dIfficult Extremely dIfficult Extremely dIfficult       07/16/2022    1:26 PM 01/25/2022    2:09 PM 11/23/2021    1:50 PM 09/04/2021    1:20 PM  GAD 7 : Generalized Anxiety Score  Nervous, Anxious, on Edge 3 3 3 1  $ Control/stop worrying 3 3 3 1  $ Worry too much - different things 3 3 3 1  $ Trouble relaxing 3 3 3 1  $ Restless 3 3 3 1  $ Easily annoyed or irritable 3 3 3 1  $ Afraid - awful might happen 3 3 3 1  $ Total GAD 7 Score 21 21  21 7  $ Anxiety Difficulty Extremely difficult Extremely difficult Extremely difficult Not difficult at all    Reviewed medical, surgical, and social history today  Medications: Outpatient Medications Prior to Visit  Medication Sig   albuterol (PROVENTIL) (2.5 MG/3ML) 0.083% nebulizer solution Take 3 mLs (2.5 mg total) by nebulization every 4 (four) hours as needed for wheezing or shortness of breath (coughing fits).   albuterol (VENTOLIN HFA) 108 (90 Base) MCG/ACT inhaler Inhale 2 puffs into the lungs every 4 (four) hours as needed for wheezing or shortness of breath (coughing fits).   amLODipine (NORVASC) 10 MG tablet Take 1 tablet (10 mg total) by mouth daily.   aspirin EC 81 MG tablet Take 1 tablet (81 mg total) by mouth 2 (two) times daily. To be taken after surgery to prevent blood clots   Budeson-Glycopyrrol-Formoterol (BREZTRI AEROSPHERE) 160-9-4.8 MCG/ACT AERO Inhale 2 puffs into the lungs in the morning and at bedtime. with spacer and rinse mouth afterwards.   cyclobenzaprine (FLEXERIL) 10 MG tablet Take 1 tablet (10 mg total) by mouth 2 (two) times daily as needed for  muscle spasms.   docusate sodium (COLACE) 100 MG capsule Take 1 capsule (100 mg total) by mouth daily as needed.   EPINEPHrine 0.3 mg/0.3 mL IJ SOAJ injection INJECT 0.3 MLS INTO MUSCLE   fenofibrate (TRICOR) 145 MG tablet TAKE 1 TABLET(145 MG) BY MOUTH DAILY   Fluticasone Furoate (ARNUITY ELLIPTA) 100 MCG/ACT AEPB Inhale 1 puff into the lungs daily. Rinse mouth after each use.   ipratropium (ATROVENT) 0.06 % nasal spray Place 2 sprays in each nostril up to three times a day as needed   losartan (COZAAR) 25 MG tablet Take 1 tablet (25 mg total) by mouth daily.   Mepolizumab (NUCALA) 100 MG/ML SOAJ Inject 1 mL (100 mg total) into the skin every 28 (twenty-eight) days.   montelukast (SINGULAIR) 10 MG tablet Take 1 tablet (10 mg total) by mouth at bedtime.   ondansetron (ZOFRAN) 4 MG tablet Take 1 tablet (4 mg total) by  mouth every 8 (eight) hours as needed for nausea or vomiting.   ondansetron (ZOFRAN) 4 MG tablet Take 1 tablet (4 mg total) by mouth every 8 (eight) hours as needed for nausea or vomiting.   oxyCODONE-acetaminophen (PERCOCET) 5-325 MG tablet Take 1-2 tablets by mouth every 6 (six) hours as needed. To be taken after surgery   pantoprazole (PROTONIX) 40 MG tablet Take 1 tablet (40 mg total) by mouth daily.   traMADol (ULTRAM) 50 MG tablet Take 1 tablet (50 mg total) by mouth 2 (two) times daily as needed.   trolamine salicylate (ASPERCREME) 10 % cream Apply 1 Application topically as needed for muscle pain.   [DISCONTINUED] meloxicam (MOBIC) 15 MG tablet Take 1 tablet (15 mg total) by mouth daily.   [DISCONTINUED] naproxen (NAPROSYN) 375 MG tablet Take 1 tablet (375 mg total) by mouth 2 (two) times daily.   [DISCONTINUED] vortioxetine HBr (TRINTELLIX) 10 MG TABS tablet Take 1 tablet (10 mg total) by mouth daily.   Facility-Administered Medications Prior to Visit  Medication Dose Route Frequency Provider   Mepolizumab SOLR 100 mg  100 mg Subcutaneous Q28 days Kennith Gain, MD   Reviewed past medical and social history.   ROS per HPI above      Objective:  BP 128/80   Pulse 81   Temp 98.6 F (37 C) (Temporal)   Resp 16   Ht 5' 8"$  (1.727 m)   Wt 178 lb 12.8 oz (81.1 kg)   SpO2 (!) 9%   BMI 27.19 kg/m      Physical Exam Cardiovascular:     Rate and Rhythm: Normal rate.     Pulses: Normal pulses.  Pulmonary:     Effort: Pulmonary effort is normal.  Neurological:     Mental Status: He is alert and oriented to person, place, and time.  Psychiatric:        Mood and Affect: Mood normal.        Behavior: Behavior normal.        Thought Content: Thought content normal.     No results found for any visits on 07/16/22.    Assessment & Plan:    Problem List Items Addressed This Visit       Cardiovascular and Mediastinum   Hypertension - Primary    Reports he did  not take BP meds today. States he does not skip med doses. BP Readings from Last 3 Encounters:  07/16/22 128/80  06/11/22 132/72  04/04/22 (!) 133/94    Maintain med doses  Other   Depression, recurrent ([place])    Reports mood swings with trintellix. He wishes to resume wellbutrin and lexapro. He missed 2 appts with [facility], hence unable to schedule another appt. Reports he has an upcoming appt with [facility] in 74month Denies any hallucination or SI or HI No tobacco or ETOH or marijuana use or illicit drug use.  Stop trintellix Resume Wellbutrin 1520mXL and lexapro 1069maily F/up with psychiatry      Relevant Medications   buPROPion (WELLBUTRIN XL) 150 MG 24 hr tablet   escitalopram (LEXAPRO) 10 MG tablet   Return in about 3 months (around 10/14/2022) for CPE (fasting).     ChaWilfred LacyP

## 2022-07-16 NOTE — Assessment & Plan Note (Signed)
Reports he did not take BP meds today. States he does not skip med doses. BP Readings from Last 3 Encounters:  07/16/22 128/80  06/11/22 132/72  04/04/22 (!) 133/94    Maintain med doses

## 2022-07-16 NOTE — Assessment & Plan Note (Signed)
Reports mood swings with trintellix. He wishes to resume wellbutrin and lexapro. He missed 2 appts with [facility], hence unable to schedule another appt. Reports he has an upcoming appt with [facility] in 35month Denies any hallucination or SI or HI No tobacco or ETOH or marijuana use or illicit drug use.  Stop trintellix Resume Wellbutrin 1538mXL and lexapro 1059maily F/up with psychiatry

## 2022-07-19 ENCOUNTER — Telehealth: Payer: Self-pay | Admitting: Allergy & Immunology

## 2022-07-19 MED ORDER — PANTOPRAZOLE SODIUM 40 MG PO TBEC: 40.0000 mg | DELAYED_RELEASE_TABLET | Freq: Every day | ORAL | 0 refills | Status: DC

## 2022-07-19 NOTE — Telephone Encounter (Signed)
Patient called the on call physician requesting a refill of his Protonix. I sent in one month without any refills. It looks like this has been done earlier this year, but he continues to miss appointments.   Salvatore Marvel, MD [facility]

## 2022-07-25 ENCOUNTER — Telehealth: Payer: Medicare HMO | Admitting: Family Medicine

## 2022-07-25 DIAGNOSIS — J069 Acute upper respiratory infection, unspecified: Secondary | ICD-10-CM

## 2022-07-25 MED ORDER — PSEUDOEPH-BROMPHEN-DM 30-2-10 MG/5ML PO SYRP: 5.0000 mL | ORAL_SOLUTION | Freq: Three times a day (TID) | ORAL | 0 refills | Status: DC | PRN

## 2022-07-25 MED ORDER — FLUTICASONE PROPIONATE 50 MCG/ACT NA SUSP: 2.0000 | Freq: Every day | NASAL | 0 refills | Status: DC

## 2022-07-25 NOTE — Patient Instructions (Signed)
Vertell Limber IV, thank you for joining Perlie Mayo, NP for today's virtual visit.  While this provider is not your primary care provider (PCP), if your PCP is located in our provider database this encounter information will be shared with them immediately following your visit.   [facility] account gives you access to today's visit and all your visits, tests, and labs performed at [facility] " click here if you don't have a [facility] account or go to [email or web address]  Consent: (Patient) Vertell Limber IV provided verbal consent for this virtual visit at the beginning of the encounter.  Current Medications:  Current Outpatient Medications:    brompheniramine-pseudoephedrine-DM 30-2-10 MG/5ML syrup, Take 5 mLs by mouth 3 (three) times daily as needed., Disp: 120 mL, Rfl: 0   fluticasone (FLONASE) 50 MCG/ACT nasal spray, Place 2 sprays into both nostrils daily., Disp: 16 g, Rfl: 0   albuterol (PROVENTIL) (2.5 MG/3ML) 0.083% nebulizer solution, Take 3 mLs (2.5 mg total) by nebulization every 4 (four) hours as needed for wheezing or shortness of breath (coughing fits)., Disp: 75 mL, Rfl: 1   albuterol (VENTOLIN HFA) 108 (90 Base) MCG/ACT inhaler, Inhale 2 puffs into the lungs every 4 (four) hours as needed for wheezing or shortness of breath (coughing fits)., Disp: 18 g, Rfl: 0   amLODipine (NORVASC) 10 MG tablet, Take 1 tablet (10 mg total) by mouth daily., Disp: 90 tablet, Rfl: 3   aspirin EC 81 MG tablet, Take 1 tablet (81 mg total) by mouth 2 (two) times daily. To be taken after surgery to prevent blood clots, Disp: 84 tablet, Rfl: 0   Budeson-Glycopyrrol-Formoterol (BREZTRI AEROSPHERE) 160-9-4.8 MCG/ACT AERO, Inhale 2 puffs into the lungs in the morning and at bedtime. with spacer and rinse mouth afterwards., Disp: 10.7 g, Rfl: 3   buPROPion (WELLBUTRIN XL) 150 MG 24 hr tablet, Take 1 tablet (150 mg total) by mouth daily., Disp: 90 tablet, Rfl: 0    cyclobenzaprine (FLEXERIL) 10 MG tablet, Take 1 tablet (10 mg total) by mouth 2 (two) times daily as needed for muscle spasms., Disp: 20 tablet, Rfl: 0   docusate sodium (COLACE) 100 MG capsule, Take 1 capsule (100 mg total) by mouth daily as needed., Disp: 30 capsule, Rfl: 2   EPINEPHrine 0.3 mg/0.3 mL IJ SOAJ injection, INJECT 0.3 MLS INTO MUSCLE, Disp: 1 each, Rfl: 2   escitalopram (LEXAPRO) 10 MG tablet, Take 1 tablet (10 mg total) by mouth daily., Disp: 90 tablet, Rfl: 0   fenofibrate (TRICOR) 145 MG tablet, TAKE 1 TABLET(145 MG) BY MOUTH DAILY, Disp: 90 tablet, Rfl: 1   Fluticasone Furoate (ARNUITY ELLIPTA) 100 MCG/ACT AEPB, Inhale 1 puff into the lungs daily. Rinse mouth after each use., Disp: 30 each, Rfl: 1   ipratropium (ATROVENT) 0.06 % nasal spray, Place 2 sprays in each nostril up to three times a day as needed, Disp: 15 mL, Rfl: 5   losartan (COZAAR) 25 MG tablet, Take 1 tablet (25 mg total) by mouth daily., Disp: 90 tablet, Rfl: 3   Mepolizumab (NUCALA) 100 MG/ML SOAJ, Inject 1 mL (100 mg total) into the skin every 28 (twenty-eight) days., Disp: 1 mL, Rfl: 11   montelukast (SINGULAIR) 10 MG tablet, Take 1 tablet (10 mg total) by mouth at bedtime., Disp: 30 tablet, Rfl: 3   ondansetron (ZOFRAN) 4 MG tablet, Take 1 tablet (4 mg total) by mouth every 8 (eight) hours as needed for nausea or vomiting., Disp: 21 tablet,  Rfl: 0   ondansetron (ZOFRAN) 4 MG tablet, Take 1 tablet (4 mg total) by mouth every 8 (eight) hours as needed for nausea or vomiting., Disp: 40 tablet, Rfl: 0   oxyCODONE-acetaminophen (PERCOCET) 5-325 MG tablet, Take 1-2 tablets by mouth every 6 (six) hours as needed. To be taken after surgery, Disp: 40 tablet, Rfl: 0   pantoprazole (PROTONIX) 40 MG tablet, Take 1 tablet (40 mg total) by mouth daily., Disp: 30 tablet, Rfl: 0   traMADol (ULTRAM) 50 MG tablet, Take 1 tablet (50 mg total) by mouth 2 (two) times daily as needed., Disp: 30 tablet, Rfl: 0   trolamine salicylate  (ASPERCREME) 10 % cream, Apply 1 Application topically as needed for muscle pain., Disp: , Rfl:   Current Facility-Administered Medications:    Mepolizumab SOLR 100 mg, 100 mg, Subcutaneous, Q28 days, Kennith Gain, MD, 100 mg at 06/11/22 1043   Medications ordered in this encounter:  Meds ordered this encounter  Medications   fluticasone (FLONASE) 50 MCG/ACT nasal spray    Sig: Place 2 sprays into both nostrils daily.    Dispense:  16 g    Refill:  0    Order Specific Question:   Supervising Provider    Answer:   Chase Picket [id number]   brompheniramine-pseudoephedrine-DM 30-2-10 MG/5ML syrup    Sig: Take 5 mLs by mouth 3 (three) times daily as needed.    Dispense:  120 mL    Refill:  0    Order Specific Question:   Supervising Provider    Answer:   Chase Picket [id number]     *If you need refills on other medications prior to your next appointment, please contact your pharmacy*  Follow-Up: Call back or seek an in-person evaluation if the symptoms worsen or if the condition fails to improve as anticipated.  [place] [phone]  Other Instructions  -Take meds as prescribed -Rest -Use a cool mist humidifier especially during the winter months when heat dries out the air. - Use saline nose sprays frequently to help soothe nasal passages and promote drainage. -Saline irrigations of the nose can be very helpful if done frequently.             * 4X daily for 1 week*             * Use of a nettie pot can be helpful with this.  *Follow directions with this* *Boiled or distilled water only -stay hydrated by drinking plenty of fluids - Keep thermostat turn down low to prevent drying out sinuses  - For fever or aches or pains- take tylenol or ibuprofen as directed on bottle             * for fevers greater than 101 orally you may alternate ibuprofen and tylenol every 3 hours.  If you do not improve you will need a follow up visit in  person.                 If you have been instructed to have an in-person evaluation today at a local Urgent Care facility, please use the link below. It will take you to a list of all of our available [facility], including address, phone number and hours of operation. Please do not delay care.   Urgent Cares  If you or a family member do not have a primary care provider, use the link below to schedule a visit and establish care.  When you choose a [facility] primary care physician or advanced practice provider, you gain a long-term partner in health. Find a Primary Care Provider  Learn more about [facility]'s in-office and virtual care options: [facility]

## 2022-07-25 NOTE — Progress Notes (Signed)
Virtual Visit Consent   Jordan [name], you are scheduled for a virtual visit with a [facility] provider today. Just as with appointments in the office, your consent must be obtained to participate. Your consent will be active for this visit and any virtual visit you may have with one of our providers in the next 365 days. If you have a MyChart account, a copy of this consent can be sent to you electronically.  As this is a virtual visit, video technology does not allow for your provider to perform a traditional examination. This may limit your provider's ability to fully assess your condition. If your provider identifies any concerns that need to be evaluated in person or the need to arrange testing (such as labs, EKG, etc.), we will make arrangements to do so. Although advances in technology are sophisticated, we cannot ensure that it will always work on either your end or our end. If the connection with a video visit is poor, the visit may have to be switched to a telephone visit. With either a video or telephone visit, we are not always able to ensure that we have a secure connection.  By engaging in this virtual visit, you consent to the provision of healthcare and authorize for your insurance to be billed (if applicable) for the services provided during this visit. Depending on your insurance coverage, you may receive a charge related to this service.  I need to obtain your verbal consent now. Are you willing to proceed with your visit today? Jordan [name] has provided verbal consent on 07/25/2022 for a virtual visit (video or telephone). Jordan Mayo, NP  Date: 07/25/2022 2:19 PM  Virtual Visit via Video Note   I, Jordan [name], connected with  Jordan [name]  ([id number], 08/15/1975) on 07/25/22 at  2:30 PM EST by a video-enabled telemedicine application and verified that I am speaking with the correct person using two identifiers.  Location: Patient: Virtual Visit Location Patient:  Home Provider: Virtual Visit Location Provider: Home Office   I discussed the limitations of evaluation and management by telemedicine and the availability of in person appointments. The patient expressed understanding and agreed to proceed.    History of Present Illness: Jordan [name] is a 47 y.o. who identifies as a male who was assigned male at birth, and is being seen today for sinus drainage. Onset was two days ago- sinus drainage, congestion, ear discomfort Associated symptoms are cough starting, congestion, ear discomfort both sides, sinus pressure, "can feel drainage from sinus to ear", mild chest pain with cough Modifying factors are tylenol, singular, OTC claritin, and inhalers, ipratropium. Denies chest pain, shortness of breath, fevers, chills Exposure to sick contacts- unknown COVID test: negative Vaccines: flu   Problems:  Patient Active Problem List   Diagnosis Date Noted   Not well controlled severe persistent asthma 06/11/2022   Non compliance with medical treatment 06/11/2022   Gastroesophageal reflux disease 06/11/2022   Recurrent infections 06/11/2022   Avascular necrosis of bone of right hip ([place]) 02/20/2022   Avascular necrosis of bone of left hip ([place]) 02/20/2022   Right groin pain 01/25/2022   Low testosterone in male 09/04/2021   Erectile dysfunction 11/02/2020   Depression, recurrent ([place]) 04/01/2020   Elevated LDL cholesterol level 07/29/2019   SOB (shortness of breath) 07/27/2019   Seasonal and perennial allergic rhinoconjunctivitis 07/20/2019   Asthma 07/20/2019   Heartburn 07/20/2019   Hypertension 07/14/2019  Allergies: No Known Allergies Medications:  Current Outpatient Medications:    albuterol (PROVENTIL) (2.5 MG/3ML) 0.083% nebulizer solution, Take 3 mLs (2.5 mg total) by nebulization every 4 (four) hours as needed for wheezing or shortness of breath (coughing fits)., Disp: 75 mL, Rfl: 1   albuterol (VENTOLIN HFA) 108 (90 Base) MCG/ACT  inhaler, Inhale 2 puffs into the lungs every 4 (four) hours as needed for wheezing or shortness of breath (coughing fits)., Disp: 18 g, Rfl: 0   amLODipine (NORVASC) 10 MG tablet, Take 1 tablet (10 mg total) by mouth daily., Disp: 90 tablet, Rfl: 3   aspirin EC 81 MG tablet, Take 1 tablet (81 mg total) by mouth 2 (two) times daily. To be taken after surgery to prevent blood clots, Disp: 84 tablet, Rfl: 0   Budeson-Glycopyrrol-Formoterol (BREZTRI AEROSPHERE) 160-9-4.8 MCG/ACT AERO, Inhale 2 puffs into the lungs in the morning and at bedtime. with spacer and rinse mouth afterwards., Disp: 10.7 g, Rfl: 3   buPROPion (WELLBUTRIN XL) 150 MG 24 hr tablet, Take 1 tablet (150 mg total) by mouth daily., Disp: 90 tablet, Rfl: 0   cyclobenzaprine (FLEXERIL) 10 MG tablet, Take 1 tablet (10 mg total) by mouth 2 (two) times daily as needed for muscle spasms., Disp: 20 tablet, Rfl: 0   docusate sodium (COLACE) 100 MG capsule, Take 1 capsule (100 mg total) by mouth daily as needed., Disp: 30 capsule, Rfl: 2   EPINEPHrine 0.3 mg/0.3 mL IJ SOAJ injection, INJECT 0.3 MLS INTO MUSCLE, Disp: 1 each, Rfl: 2   escitalopram (LEXAPRO) 10 MG tablet, Take 1 tablet (10 mg total) by mouth daily., Disp: 90 tablet, Rfl: 0   fenofibrate (TRICOR) 145 MG tablet, TAKE 1 TABLET(145 MG) BY MOUTH DAILY, Disp: 90 tablet, Rfl: 1   Fluticasone Furoate (ARNUITY ELLIPTA) 100 MCG/ACT AEPB, Inhale 1 puff into the lungs daily. Rinse mouth after each use., Disp: 30 each, Rfl: 1   ipratropium (ATROVENT) 0.06 % nasal spray, Place 2 sprays in each nostril up to three times a day as needed, Disp: 15 mL, Rfl: 5   losartan (COZAAR) 25 MG tablet, Take 1 tablet (25 mg total) by mouth daily., Disp: 90 tablet, Rfl: 3   Mepolizumab (NUCALA) 100 MG/ML SOAJ, Inject 1 mL (100 mg total) into the skin every 28 (twenty-eight) days., Disp: 1 mL, Rfl: 11   montelukast (SINGULAIR) 10 MG tablet, Take 1 tablet (10 mg total) by mouth at bedtime., Disp: 30 tablet, Rfl:  3   ondansetron (ZOFRAN) 4 MG tablet, Take 1 tablet (4 mg total) by mouth every 8 (eight) hours as needed for nausea or vomiting., Disp: 21 tablet, Rfl: 0   ondansetron (ZOFRAN) 4 MG tablet, Take 1 tablet (4 mg total) by mouth every 8 (eight) hours as needed for nausea or vomiting., Disp: 40 tablet, Rfl: 0   oxyCODONE-acetaminophen (PERCOCET) 5-325 MG tablet, Take 1-2 tablets by mouth every 6 (six) hours as needed. To be taken after surgery, Disp: 40 tablet, Rfl: 0   pantoprazole (PROTONIX) 40 MG tablet, Take 1 tablet (40 mg total) by mouth daily., Disp: 30 tablet, Rfl: 0   traMADol (ULTRAM) 50 MG tablet, Take 1 tablet (50 mg total) by mouth 2 (two) times daily as needed., Disp: 30 tablet, Rfl: 0   trolamine salicylate (ASPERCREME) 10 % cream, Apply 1 Application topically as needed for muscle pain., Disp: , Rfl:   Current Facility-Administered Medications:    Mepolizumab SOLR 100 mg, 100 mg, Subcutaneous, Q28 days, Padgett, [employer],  MD, 100 mg at 06/11/22 1043  Observations/Objective: Patient is well-developed, well-nourished in no acute distress.  Resting comfortably  at home.  Head is normocephalic, atraumatic.  No labored breathing.  Speech is clear and coherent with logical content.  Patient is alert and oriented at baseline.    Assessment and Plan:  1. Viral URI with cough  - fluticasone (FLONASE) 50 MCG/ACT nasal spray; Place 2 sprays into both nostrils daily.  Dispense: 16 g; Refill: 0 - brompheniramine-pseudoephedrine-DM 30-2-10 MG/5ML syrup; Take 5 mLs by mouth 3 (three) times daily as needed.  Dispense: 120 mL; Refill: 0   -Take meds as prescribed -Rest -Use a cool mist humidifier especially during the winter months when heat dries out the air. - Use saline nose sprays frequently to help soothe nasal passages and promote drainage. -Saline irrigations of the nose can be very helpful if done frequently.             * 4X daily for 1 week*             * Use of a  nettie pot can be helpful with this.  *Follow directions with this* *Boiled or distilled water only -stay hydrated by drinking plenty of fluids - Keep thermostat turn down low to prevent drying out sinuses - For fever or aches or pains- take tylenol or ibuprofen as directed on bottle             * for fevers greater than 101 orally you may alternate ibuprofen and tylenol every 3 hours.  If you do not improve you will need a follow up visit in person.               Reviewed side effects, risks and benefits of medication.    Patient acknowledged agreement and understanding of the plan.   Past Medical, Surgical, Social History, Allergies, and Medications have been Reviewed.    Follow Up Instructions: I discussed the assessment and treatment plan with the patient. The patient was provided an opportunity to ask questions and all were answered. The patient agreed with the plan and demonstrated an understanding of the instructions.  A copy of instructions were sent to the patient via MyChart unless otherwise noted below.    The patient was advised to call back or seek an in-person evaluation if the symptoms worsen or if the condition fails to improve as anticipated.  Time:  I spent 10 minutes with the patient via telehealth technology discussing the above problems/concerns.    Jordan Mayo, NP

## 2022-07-26 ENCOUNTER — Other Ambulatory Visit: Payer: Self-pay | Admitting: Physician Assistant

## 2022-07-30 ENCOUNTER — Encounter: Payer: Self-pay | Admitting: Nurse Practitioner

## 2022-08-03 ENCOUNTER — Ambulatory Visit (INDEPENDENT_AMBULATORY_CARE_PROVIDER_SITE_OTHER): Payer: Medicare HMO | Admitting: Allergy

## 2022-08-03 ENCOUNTER — Other Ambulatory Visit: Payer: Self-pay

## 2022-08-03 ENCOUNTER — Encounter: Payer: Self-pay | Admitting: Allergy

## 2022-08-03 VITALS — BP 138/98 | HR 82 | Temp 98.4°F | Resp 16 | Ht 68.0 in | Wt 177.5 lb

## 2022-08-03 DIAGNOSIS — H1010 Acute atopic conjunctivitis, unspecified eye: Secondary | ICD-10-CM

## 2022-08-03 DIAGNOSIS — J4550 Severe persistent asthma, uncomplicated: Secondary | ICD-10-CM

## 2022-08-03 DIAGNOSIS — J302 Other seasonal allergic rhinitis: Secondary | ICD-10-CM

## 2022-08-03 DIAGNOSIS — H1013 Acute atopic conjunctivitis, bilateral: Secondary | ICD-10-CM | POA: Diagnosis not present

## 2022-08-03 DIAGNOSIS — K219 Gastro-esophageal reflux disease without esophagitis: Secondary | ICD-10-CM | POA: Diagnosis not present

## 2022-08-03 DIAGNOSIS — B999 Unspecified infectious disease: Secondary | ICD-10-CM | POA: Diagnosis not present

## 2022-08-03 MED ORDER — PANTOPRAZOLE SODIUM 40 MG PO TBEC: DELAYED_RELEASE_TABLET | ORAL | 1 refills | Status: DC

## 2022-08-03 MED ORDER — FLUTICASONE PROPIONATE 50 MCG/ACT NA SUSP: 2.0000 | Freq: Every day | NASAL | 1 refills | Status: DC

## 2022-08-03 MED ORDER — ALBUTEROL SULFATE HFA 108 (90 BASE) MCG/ACT IN AERS: 2.0000 | INHALATION_SPRAY | RESPIRATORY_TRACT | 1 refills | Status: DC | PRN

## 2022-08-03 MED ORDER — BREZTRI AEROSPHERE 160-9-4.8 MCG/ACT IN AERO: 2.0000 | INHALATION_SPRAY | Freq: Two times a day (BID) | RESPIRATORY_TRACT | 1 refills | Status: DC

## 2022-08-03 MED ORDER — MONTELUKAST SODIUM 10 MG PO TABS: 10.0000 mg | ORAL_TABLET | Freq: Every day | ORAL | 1 refills | Status: DC

## 2022-08-03 MED ORDER — IPRATROPIUM BROMIDE 0.06 % NA SOLN: NASAL | 1 refills | Status: AC

## 2022-08-03 MED ORDER — ALBUTEROL SULFATE (2.5 MG/3ML) 0.083% IN NEBU: 2.5000 mg | INHALATION_SOLUTION | RESPIRATORY_TRACT | 1 refills | Status: DC | PRN

## 2022-08-03 NOTE — Patient Instructions (Signed)
Asthma:  Make sure you take your daily controller medications! Do NOT use albuterol more than every 4 hours as it can lessen its effectiveness. Albuterol can also make your heart race. Nucala given today - continue every 4 weeks. You can get it even when you are sick. Will see if [place] monthly injections will be covered with insurance and if so will likely change from [place] to [place] as this may provide better asthma control for you.  Tammy will notify if Cheron Every is an approved medication for you.  Continue to come to office once a month for asthma medication injection  Daily controller medication(s):   Breztri 2 puffs twice a day with spacer and rinse mouth afterwards.   Sample provided Singulair '10mg'$  daily at night.  During respiratory infections/flares:  Pretreat with albuterol 2 puffs or albuterol nebulizer.  If you need to use your albuterol nebulizer machine back to back within 15-30 minutes with no relief then please go to the ER/urgent care for further evaluation.  May use albuterol rescue inhaler 2 puffs or nebulizer every 4 to 6 hours as needed for shortness of breath, chest tightness, coughing, and wheezing. May use albuterol rescue inhaler 2 puffs 5 to 15 minutes prior to strenuous physical activities. Monitor frequency of use.  Breathing control goals:  Full participation in all desired activities (may need albuterol before activity) Albuterol use two times or less a week on average (not counting use with activity) Cough interfering with sleep two times or less a month Oral steroids no more than once a year No hospitalizations   Seasonal and perennial allergic rhinoconjunctivitis Past history - 2021 skin testing was positive to grass, tree, mold, dust mites. Use Nasacort (triamcinolone) nasal spray 1 spray per nostril twice a day as needed for nasal congestion. Sample given.  Continue with nasal Atrovent 2 sprays each nostril up to 3-4 times a day as needed for nasal  congestion and drainage. Nasal saline spray (i.e., Simply Saline) or nasal saline lavage (i.e., NeilMed) is recommended as needed and prior to medicated nasal sprays. May take an over the counter antihistamine Allegra or Xyzal once a day.  Xyzal samples provided.   Continue Singulair '10mg'$  nightly. Stop sudafed.  Heartburn Take pantoprazole '20mg'$  in the morning. Nothing to eat or drink for 30 minutes.  Recurrent infections Keep track of current infections and number of antibiotics needed  Follow up in 3-4 months or sooner if needed

## 2022-08-03 NOTE — Progress Notes (Signed)
Follow-up Note  RE: Jordan [name] MRN: [id number] DOB: November 12, 1975 Date of Office Visit: 08/03/2022   History of present illness: Jordan [name] IV is a 47 y.o. male presenting today for follow-up of asthma, allergic rhinitis with conjunctivitis and GERD.  He was last seen in the home on 06/11/22 by Dr Maudie Mercury.  He states he is struggling again with his asthma and sinuses.  He states he is out again of most of his medications.  He is using [place] he states 3 times a day and albuterol via neb 3 times a day due to cough and shortness of breath.  He states he is out of his albuterol inhaler.  He also does not have Qvar.  He states he is getting close to running out of Singulair as well.  He did receive a Nucala injection in at last visit however did not receive dose in February and states he is to get injection today.  He has mentioned on previous visits that he does not feel the Nucala is lasting all 4 weeks.  Fortunately he has not had any systemic steroids or antibiotics since last visit.  With his sinuses he states he is super congested.  He states he does use the nasal atrovent and nasacort but again is almost out of these.  He is currently taking claritin and does not believe it is doing anything. He states he has not taken sudafed since last visit.   He does take pantoprazole 1/2 tab daily for reflux control.    Review of systems: Review of Systems  Constitutional: Negative.   HENT:  Positive for congestion and sinus pressure.   Eyes: Negative.   Respiratory:  Positive for cough and shortness of breath.   Cardiovascular: Negative.   Musculoskeletal: Negative.   Skin: Negative.   Allergic/Immunologic: Negative.   Neurological: Negative.      All other systems negative unless noted above in HPI  Past medical/social/surgical/family history have been reviewed and are unchanged unless specifically indicated below.  No changes  Medication List: Current Outpatient Medications   Medication Sig Dispense Refill   albuterol (PROVENTIL) (2.5 MG/3ML) 0.083% nebulizer solution Take 3 mLs (2.5 mg total) by nebulization [name] 4 (four) hours as needed for wheezing or shortness of breath (coughing fits). 75 mL 1   albuterol (VENTOLIN HFA) 108 (90 Base) MCG/ACT inhaler Inhale 2 puffs into the lungs [name] 4 (four) hours as needed for wheezing or shortness of breath (coughing fits). 18 g 0   amLODipine (NORVASC) 10 MG tablet Take 1 tablet (10 mg total) by mouth daily. 90 tablet 3   aspirin EC 81 MG tablet Take 1 tablet (81 mg total) by mouth 2 (two) times daily. To be taken after surgery to prevent blood clots 84 tablet 0   Azelastine HCl 137 MCG/SPRAY SOLN      Budeson-Glycopyrrol-Formoterol (BREZTRI AEROSPHERE) 160-9-4.8 MCG/ACT AERO Inhale 2 puffs into the lungs in the morning and at bedtime. with spacer and rinse mouth afterwards. 10.7 g 3   buPROPion (WELLBUTRIN XL) 150 MG 24 hr tablet Take 1 tablet (150 mg total) by mouth daily. 90 tablet 0   cyclobenzaprine (FLEXERIL) 10 MG tablet Take 1 tablet (10 mg total) by mouth 2 (two) times daily as needed for muscle spasms. 20 tablet 0   docusate sodium (COLACE) 100 MG capsule Take 1 capsule (100 mg total) by mouth daily as needed. 30 capsule 2   escitalopram (LEXAPRO) 10 MG tablet Take 1 tablet (  10 mg total) by mouth daily. 90 tablet 0   fenofibrate (TRICOR) 145 MG tablet TAKE 1 TABLET(145 MG) BY MOUTH DAILY 90 tablet 1   fluticasone (FLONASE) 50 MCG/ACT nasal spray Place 2 sprays into both nostrils daily. 16 g 0   Fluticasone Furoate (ARNUITY ELLIPTA) 100 MCG/ACT AEPB Inhale 1 puff into the lungs daily. Rinse mouth after each use. 30 each 1   ipratropium (ATROVENT) 0.06 % nasal spray Place 2 sprays in each nostril up to three times a day as needed 15 mL 5   losartan (COZAAR) 25 MG tablet Take 1 tablet (25 mg total) by mouth daily. 90 tablet 3   Mepolizumab (NUCALA) 100 MG/ML SOAJ Inject 1 mL (100 mg total) into the skin [name] 28  (twenty-eight) days. 1 mL 11   montelukast (SINGULAIR) 10 MG tablet Take 1 tablet (10 mg total) by mouth at bedtime. 30 tablet 3   ondansetron (ZOFRAN) 4 MG tablet Take 1 tablet (4 mg total) by mouth [name] 8 (eight) hours as needed for nausea or vomiting. 21 tablet 0   ondansetron (ZOFRAN) 4 MG tablet Take 1 tablet (4 mg total) by mouth [name] 8 (eight) hours as needed for nausea or vomiting. 40 tablet 0   pantoprazole (PROTONIX) 40 MG tablet Take 1 tablet (40 mg total) by mouth daily. 30 tablet 0   traMADol (ULTRAM) 50 MG tablet Take 1 tablet (50 mg total) by mouth 2 (two) times daily as needed. 30 tablet 0   brompheniramine-pseudoephedrine-DM 30-2-10 MG/5ML syrup Take 5 mLs by mouth 3 (three) times daily as needed. (Patient not taking: Reported on 08/03/2022) 120 mL 0   EPINEPHrine 0.3 mg/0.3 mL IJ SOAJ injection INJECT 0.3 MLS INTO MUSCLE (Patient not taking: Reported on 08/03/2022) 1 each 2   oxyCODONE-acetaminophen (PERCOCET) 5-325 MG tablet Take 1-2 tablets by mouth [name] 6 (six) hours as needed. To be taken after surgery (Patient not taking: Reported on 08/03/2022) 40 tablet 0   trolamine salicylate (ASPERCREME) 10 % cream Apply 1 Application topically as needed for muscle pain. (Patient not taking: Reported on 08/03/2022)     Current Facility-Administered Medications  Medication Dose Route Frequency Provider Last Rate Last Admin   Mepolizumab SOLR 100 mg  100 mg Subcutaneous Q28 days Kennith Gain, MD   100 mg at 08/03/22 1200     Known medication allergies: No Known Allergies   Physical examination: Blood pressure (!) 138/98, pulse 82, temperature 98.4 F (36.9 C), resp. rate 16, height '5\' 8"'$  (1.727 m), weight 177 lb 8 oz (80.5 kg), SpO2 98 %.  General: Alert, interactive, in no acute distress. HEENT: PERRLA, TMs pearly gray, turbinates moderately edematous without discharge, post-pharynx non erythematous. Neck: Supple without lymphadenopathy. Lungs: Clear to auscultation  without wheezing, rhonchi or rales. {no increased work of breathing. CV: Normal S1, S2 without murmurs. Abdomen: Nondistended, nontender. Skin: Warm and dry, without lesions or rashes. Extremities:  No clubbing, cyanosis or edema. Neuro:   Grossly intact.  Diagnositics/Labs: Nucala injection give in clinic today  Assessment and plan:   Asthma:  Make sure you take your daily controller medications! Do NOT use albuterol more than [name] 4 hours as it can lessen its effectiveness. Albuterol can also make your heart race. Nucala given today - continue [name] 4 weeks. You can get it even when you are sick. Will see if [place] monthly injections will be covered with insurance and if so will likely change from [place] to [place] as this may provide better  asthma control for you.  Tammy will notify if Jordan [name] is an approved medication for you.  Continue to come to office once a month for asthma medication injection  Daily controller medication(s):   Breztri 2 puffs twice a day with spacer and rinse mouth afterwards.   Sample provided Singulair '10mg'$  daily at night.  During respiratory infections/flares:  Pretreat with albuterol 2 puffs or albuterol nebulizer.  If you need to use your albuterol nebulizer machine back to back within 15-30 minutes with no relief then please go to the ER/urgent care for further evaluation.  May use albuterol rescue inhaler 2 puffs or nebulizer [name] 4 to 6 hours as needed for shortness of breath, chest tightness, coughing, and wheezing. May use albuterol rescue inhaler 2 puffs 5 to 15 minutes prior to strenuous physical activities. Monitor frequency of use.  Breathing control goals:  Full participation in all desired activities (may need albuterol before activity) Albuterol use two times or less a week on average (not counting use with activity) Cough interfering with sleep two times or less a month Oral steroids no more than once a year No hospitalizations    Seasonal and perennial allergic rhinoconjunctivitis Past history - 2021 skin testing was positive to grass, tree, mold, dust mites. Use Nasacort (triamcinolone) nasal spray 1 spray per nostril twice a day as needed for nasal congestion. Sample given.  Continue with nasal Atrovent 2 sprays each nostril up to 3-4 times a day as needed for nasal congestion and drainage. Nasal saline spray (i.e., Simply Saline) or nasal saline lavage (i.e., NeilMed) is recommended as needed and prior to medicated nasal sprays. May take an over the counter antihistamine Allegra or Xyzal once a day.  Xyzal samples provided.   Continue Singulair '10mg'$  nightly. Stop sudafed.  Heartburn Take pantoprazole '20mg'$  in the morning. Nothing to eat or drink for 30 minutes.  Recurrent infections Keep track of current infections and number of antibiotics needed  Follow up in 3-4 months or sooner if needed  I appreciate the opportunity to take part in Jordan [name]'s care. Please do not hesitate to contact me with questions.  Sincerely,   Prudy Feeler, MD Allergy/Immunology [facility]

## 2022-08-15 ENCOUNTER — Telehealth: Payer: Self-pay | Admitting: Orthopaedic Surgery

## 2022-08-15 NOTE — Telephone Encounter (Signed)
Patient called needing something for pain called into his pharmacy.  Patient uses [facility] on [address]. The number to contact patient is [phone]

## 2022-08-16 ENCOUNTER — Other Ambulatory Visit: Payer: Self-pay | Admitting: Physician Assistant

## 2022-08-16 MED ORDER — TRAMADOL HCL 50 MG PO TABS: 50.0000 mg | ORAL_TABLET | Freq: Two times a day (BID) | ORAL | 0 refills | Status: AC | PRN

## 2022-08-16 NOTE — Telephone Encounter (Signed)
Sent in tramadol

## 2022-08-17 ENCOUNTER — Telehealth: Payer: Self-pay | Admitting: Orthopaedic Surgery

## 2022-08-17 NOTE — Telephone Encounter (Signed)
Patient aware that per Erlinda Hong he can not have anything stronger until he has surgery

## 2022-08-17 NOTE — Telephone Encounter (Signed)
Patient called advised he can not take Tramadol because it does not help with the pain. Patient asked if he can get something stronger? The number to contact patient is [phone]

## 2022-08-17 NOTE — Telephone Encounter (Signed)
Pt last in office 02/20/2022 AVN right hip. You sent in rx for tramadol earlier this week. Please see message below and advise.

## 2022-08-30 ENCOUNTER — Ambulatory Visit (INDEPENDENT_AMBULATORY_CARE_PROVIDER_SITE_OTHER): Payer: Medicare HMO

## 2022-08-30 DIAGNOSIS — J4550 Severe persistent asthma, uncomplicated: Secondary | ICD-10-CM

## 2022-09-27 ENCOUNTER — Ambulatory Visit: Payer: Medicare HMO

## 2022-09-28 ENCOUNTER — Other Ambulatory Visit: Payer: Self-pay | Admitting: Nurse Practitioner

## 2022-09-28 DIAGNOSIS — I10 Essential (primary) hypertension: Secondary | ICD-10-CM

## 2022-09-28 NOTE — Telephone Encounter (Signed)
Pt needs to schedule his 3 month f/u

## 2022-10-08 NOTE — Progress Notes (Deleted)
   Office Visit Note   Patient: Jordan [name]           Date of Birth: 05/30/1975           MRN: [id number] Visit Date: 10/09/2022              Requested by: Anne Ng, NP [address] [place],  [place] [address] PCP: Anne Ng, NP   Assessment & Plan: Visit Diagnoses:  1. Avascular necrosis of bone of right hip (HCC)     Plan: ***  Follow-Up Instructions: No follow-ups on file.   Orders:  No orders of the defined types were placed in this encounter.  No orders of the defined types were placed in this encounter.     Procedures: No procedures performed   Clinical Data: No additional findings.   Subjective: No chief complaint on file.   HPI  Review of Systems   Objective: Vital Signs: There were no vitals taken for this visit.  Physical Exam  Ortho Exam  Specialty Comments:  No specialty comments available.  Imaging: No results found.   PMFS History: Patient Active Problem List   Diagnosis Date Noted   Not well controlled severe persistent asthma 06/11/2022   Non compliance with medical treatment 06/11/2022   Gastroesophageal reflux disease 06/11/2022   Recurrent infections 06/11/2022   Avascular necrosis of bone of right hip (HCC) 02/20/2022   Avascular necrosis of bone of left hip (HCC) 02/20/2022   Right groin pain 01/25/2022   Low testosterone in male 09/04/2021   Erectile dysfunction 11/02/2020   Depression, recurrent (HCC) 04/01/2020   Elevated LDL cholesterol level 07/29/2019   SOB (shortness of breath) 07/27/2019   Seasonal and perennial allergic rhinoconjunctivitis 07/20/2019   Asthma 07/20/2019   Heartburn 07/20/2019   Hypertension 07/14/2019   Past Medical History:  Diagnosis Date   Anxiety    Asthma    Bipolar disorder (HCC)    Depression    Elevated LFTs 07/29/2019   GERD (gastroesophageal reflux disease)    Hypertension    Reflux esophagitis     Family History  Problem Relation Age of  Onset   Hypertension Mother    Glaucoma Mother 75   Hypertension Father     Past Surgical History:  Procedure Laterality Date   NO PAST SURGERIES     Social History   Occupational History   Not on file  Tobacco Use   Smoking status: Never   Smokeless tobacco: Never  Vaping Use   Vaping Use: Never used  Substance and Sexual Activity   Alcohol use: Yes    Alcohol/week: 3.0 - 5.0 standard drinks of alcohol    Types: 3 - 5 Cans of beer per week   Drug use: No   Sexual activity: Yes    Birth control/protection: None

## 2022-10-09 ENCOUNTER — Ambulatory Visit: Payer: Medicare HMO | Admitting: Orthopaedic Surgery

## 2022-10-09 DIAGNOSIS — M87051 Idiopathic aseptic necrosis of right femur: Secondary | ICD-10-CM

## 2022-10-10 ENCOUNTER — Telehealth: Payer: Self-pay | Admitting: Nurse Practitioner

## 2022-10-10 ENCOUNTER — Ambulatory Visit: Payer: Medicare HMO | Admitting: Nurse Practitioner

## 2022-10-10 NOTE — Telephone Encounter (Signed)
5.15.24 no show letter sent 

## 2022-10-11 ENCOUNTER — Ambulatory Visit: Payer: Medicare HMO

## 2022-10-19 NOTE — Telephone Encounter (Signed)
No show/cancel history 10/02/2021 no show 11/02/2021 no show (final warning sent 12/18/21) 06/11/2022 same day cancel/daughter sick - did not count 10/10/2022 had wrong appt date, thought it was on 10/12/2022  Pt has rescheduled for 10/25/2022  Do you want to send another final warning, give to pt at visit 5/30, or proceed to dismissal?

## 2022-10-22 NOTE — Progress Notes (Unsigned)
   Office Visit Note   Patient: Jordan [name]           Date of Birth: Jan 13, 1976           MRN: [id number] Visit Date: 10/23/2022              Requested by: Anne Ng, NP [address] [place],  [place] [address] PCP: Anne Ng, NP   Assessment & Plan: Visit Diagnoses:  1. Avascular necrosis of bone of right hip (HCC)   2. Avascular necrosis of bone of left hip (HCC)     Plan: no show  Follow-Up Instructions: No follow-ups on file.   Orders:  No orders of the defined types were placed in this encounter.  No orders of the defined types were placed in this encounter.     Procedures: No procedures performed   Clinical Data: No additional findings.   Subjective: No chief complaint on file.   HPI  Review of Systems   Objective: Vital Signs: There were no vitals taken for this visit.  Physical Exam  Ortho Exam  Specialty Comments:  No specialty comments available.  Imaging: No results found.   PMFS History: Patient Active Problem List   Diagnosis Date Noted   Not well controlled severe persistent asthma 06/11/2022   Non compliance with medical treatment 06/11/2022   Gastroesophageal reflux disease 06/11/2022   Recurrent infections 06/11/2022   Avascular necrosis of bone of right hip (HCC) 02/20/2022   Avascular necrosis of bone of left hip (HCC) 02/20/2022   Right groin pain 01/25/2022   Low testosterone in male 09/04/2021   Erectile dysfunction 11/02/2020   Depression, recurrent (HCC) 04/01/2020   Elevated LDL cholesterol level 07/29/2019   SOB (shortness of breath) 07/27/2019   Seasonal and perennial allergic rhinoconjunctivitis 07/20/2019   Asthma 07/20/2019   Heartburn 07/20/2019   Hypertension 07/14/2019   Past Medical History:  Diagnosis Date   Anxiety    Asthma    Bipolar disorder (HCC)    Depression    Elevated LFTs 07/29/2019   GERD (gastroesophageal reflux disease)    Hypertension    Reflux  esophagitis     Family History  Problem Relation Age of Onset   Hypertension Mother    Glaucoma Mother 31   Hypertension Father     Past Surgical History:  Procedure Laterality Date   NO PAST SURGERIES     Social History   Occupational History   Not on file  Tobacco Use   Smoking status: Never   Smokeless tobacco: Never  Vaping Use   Vaping Use: Never used  Substance and Sexual Activity   Alcohol use: Yes    Alcohol/week: 3.0 - 5.0 standard drinks of alcohol    Types: 3 - 5 Cans of beer per week   Drug use: No   Sexual activity: Yes    Birth control/protection: None

## 2022-10-23 ENCOUNTER — Encounter: Payer: Self-pay | Admitting: Nurse Practitioner

## 2022-10-23 ENCOUNTER — Ambulatory Visit (INDEPENDENT_AMBULATORY_CARE_PROVIDER_SITE_OTHER): Payer: Medicare HMO | Admitting: Orthopaedic Surgery

## 2022-10-23 DIAGNOSIS — M87052 Idiopathic aseptic necrosis of left femur: Secondary | ICD-10-CM

## 2022-10-23 DIAGNOSIS — M87051 Idiopathic aseptic necrosis of right femur: Secondary | ICD-10-CM

## 2022-10-23 NOTE — Telephone Encounter (Signed)
Final warning letter sent via mail, mychart and sent text

## 2022-10-24 ENCOUNTER — Ambulatory Visit: Payer: Medicare HMO

## 2022-10-25 ENCOUNTER — Ambulatory Visit: Payer: Medicare HMO | Admitting: Nurse Practitioner

## 2022-10-26 ENCOUNTER — Telehealth: Payer: Self-pay | Admitting: Nurse Practitioner

## 2022-10-26 NOTE — Telephone Encounter (Signed)
Pt was a no show for an OV with [place] on 10/25/22, he said "we just had a baby". I did not send a letter.

## 2022-10-29 NOTE — Telephone Encounter (Signed)
No show history:   10/02/2021  11/02/2021 no show (final warning sent 12/18/21)  06/11/2022 same day cancel/daughter sick - did not count  10/10/2022 had wrong appt date, thought it was on 10/12/2022, sent final warning letter 10/23/2022 via mail and mychart  10/25/2022 no show, called 5/31 stating they had a baby  Pt has been advised verbay by Almira Coaster that he has been dismissed and we can see for urgent needs for 30 days. Dismissal being mailed to pt.

## 2022-10-30 ENCOUNTER — Encounter: Payer: Self-pay | Admitting: Orthopaedic Surgery

## 2022-10-30 ENCOUNTER — Ambulatory Visit (INDEPENDENT_AMBULATORY_CARE_PROVIDER_SITE_OTHER): Payer: Medicare HMO | Admitting: Orthopaedic Surgery

## 2022-10-30 DIAGNOSIS — M87051 Idiopathic aseptic necrosis of right femur: Secondary | ICD-10-CM

## 2022-10-30 NOTE — Progress Notes (Signed)
Office Visit Note   Patient: Jordan [name]           Date of Birth: 11/18/1975           MRN: [id number] Visit Date: 10/30/2022              Requested by: Anne Ng, NP [address] [place],  [place] [address] PCP: Pcp, No   Assessment & Plan: Visit Diagnoses:  1. Avascular necrosis of bone of right hip (HCC)     Plan: Impression is bilateral hip avascular necrosis.  The right hip is symptomatic and he would like to try a steroid injection.  We will send him to Dr. Shon Baton for this.  Follow-up as needed.  Follow-Up Instructions: No follow-ups on file.   Orders:  No orders of the defined types were placed in this encounter.  No orders of the defined types were placed in this encounter.     Procedures: No procedures performed   Clinical Data: No additional findings.   Subjective: Chief Complaint  Patient presents with   Right Hip - Pain    HPI Patient comes in today for follow-up of bilateral hip avascular necrosis.  He is symptomatic on the right side.  He is interested in trying a cortisone injection as his symptoms have gotten worse. Review of Systems  Constitutional: Negative.   HENT: Negative.    Eyes: Negative.   Respiratory: Negative.    Cardiovascular: Negative.   Gastrointestinal: Negative.   Endocrine: Negative.   Genitourinary: Negative.   Skin: Negative.   Allergic/Immunologic: Negative.   Neurological: Negative.   Hematological: Negative.   Psychiatric/Behavioral: Negative.    All other systems reviewed and are negative.    Objective: Vital Signs: There were no vitals taken for this visit.  Physical Exam Vitals and nursing note reviewed.  Constitutional:      Appearance: He is well-developed.  Pulmonary:     Effort: Pulmonary effort is normal.  Abdominal:     Palpations: Abdomen is soft.  Skin:    General: Skin is warm.  Neurological:     Mental Status: He is alert and oriented to person, place, and time.   Psychiatric:        Behavior: Behavior normal.        Thought Content: Thought content normal.        Judgment: Judgment normal.     Ortho Exam Examination of bilateral hips are unchanged. Specialty Comments:  No specialty comments available.  Imaging: No results found.   PMFS History: Patient Active Problem List   Diagnosis Date Noted   Not well controlled severe persistent asthma 06/11/2022   Non compliance with medical treatment 06/11/2022   Gastroesophageal reflux disease 06/11/2022   Recurrent infections 06/11/2022   Avascular necrosis of bone of right hip (HCC) 02/20/2022   Avascular necrosis of bone of left hip (HCC) 02/20/2022   Right groin pain 01/25/2022   Low testosterone in male 09/04/2021   Erectile dysfunction 11/02/2020   Depression, recurrent (HCC) 04/01/2020   Elevated LDL cholesterol level 07/29/2019   SOB (shortness of breath) 07/27/2019   Seasonal and perennial allergic rhinoconjunctivitis 07/20/2019   Asthma 07/20/2019   Heartburn 07/20/2019   Hypertension 07/14/2019   Past Medical History:  Diagnosis Date   Anxiety    Asthma    Bipolar disorder (HCC)    Depression    Elevated LFTs 07/29/2019   GERD (gastroesophageal reflux disease)    Hypertension  Reflux esophagitis     Family History  Problem Relation Age of Onset   Hypertension Mother    Glaucoma Mother 40   Hypertension Father     Past Surgical History:  Procedure Laterality Date   NO PAST SURGERIES     Social History   Occupational History   Not on file  Tobacco Use   Smoking status: Never   Smokeless tobacco: Never  Vaping Use   Vaping Use: Never used  Substance and Sexual Activity   Alcohol use: Yes    Alcohol/week: 3.0 - 5.0 standard drinks of alcohol    Types: 3 - 5 Cans of beer per week   Drug use: No   Sexual activity: Yes    Birth control/protection: None

## 2022-11-01 ENCOUNTER — Other Ambulatory Visit: Payer: Self-pay | Admitting: Nurse Practitioner

## 2022-11-01 DIAGNOSIS — I10 Essential (primary) hypertension: Secondary | ICD-10-CM

## 2022-11-02 ENCOUNTER — Other Ambulatory Visit: Payer: Self-pay | Admitting: Nurse Practitioner

## 2022-11-02 ENCOUNTER — Telehealth: Payer: Self-pay

## 2022-11-02 DIAGNOSIS — F332 Major depressive disorder, recurrent severe without psychotic features: Secondary | ICD-10-CM

## 2022-11-02 DIAGNOSIS — I10 Essential (primary) hypertension: Secondary | ICD-10-CM

## 2022-11-02 NOTE — Telephone Encounter (Signed)
Pt called to make an appt was told he had been discharged. He said the last appt was due to his wife having a baby.

## 2022-11-05 ENCOUNTER — Ambulatory Visit: Payer: Medicare HMO

## 2022-11-05 NOTE — Telephone Encounter (Signed)
Pt has missed several appointments and received letters to notify him of policy, additional missed visits would be dismissal, and has been sent dismissal letter.  Dismissal stands. Mychart msg sent to pt.

## 2022-11-08 ENCOUNTER — Ambulatory Visit (INDEPENDENT_AMBULATORY_CARE_PROVIDER_SITE_OTHER): Payer: Medicare HMO

## 2022-11-08 DIAGNOSIS — J4550 Severe persistent asthma, uncomplicated: Secondary | ICD-10-CM | POA: Diagnosis not present

## 2022-11-13 ENCOUNTER — Other Ambulatory Visit: Payer: Self-pay

## 2022-11-13 ENCOUNTER — Ambulatory Visit (INDEPENDENT_AMBULATORY_CARE_PROVIDER_SITE_OTHER): Payer: Medicare HMO | Admitting: Sports Medicine

## 2022-11-13 DIAGNOSIS — M25551 Pain in right hip: Secondary | ICD-10-CM | POA: Diagnosis not present

## 2022-11-13 DIAGNOSIS — M87051 Idiopathic aseptic necrosis of right femur: Secondary | ICD-10-CM | POA: Diagnosis not present

## 2022-11-13 DIAGNOSIS — M87052 Idiopathic aseptic necrosis of left femur: Secondary | ICD-10-CM

## 2022-11-13 MED ORDER — METHYLPREDNISOLONE ACETATE 40 MG/ML IJ SUSP
80.0000 mg | INTRAMUSCULAR | Status: AC | PRN
Start: 2022-11-13 — End: 2022-11-13
  Administered 2022-11-13: 80 mg via INTRA_ARTICULAR

## 2022-11-13 MED ORDER — LIDOCAINE HCL 1 % IJ SOLN
4.0000 mL | INTRAMUSCULAR | Status: AC | PRN
Start: 2022-11-13 — End: 2022-11-13
  Administered 2022-11-13: 4 mL

## 2022-11-13 NOTE — Progress Notes (Signed)
   Procedure Note  Patient: Jordan [name]             Date of Birth: Apr 29, 1976           MRN: [id number]             Visit Date: 11/13/2022  Procedures: Visit Diagnoses:  1. Avascular necrosis of bone of right hip (HCC)   2. Avascular necrosis of bone of left hip (HCC)   3. Pain in right hip    Large Joint Inj: R hip joint on 11/13/2022 4:25 PM Indications: pain Details: 22 G 3.5 in needle, ultrasound-guided anterior approach Medications: 4 mL lidocaine 1 %; 80 mg methylPREDNISolone acetate 40 MG/ML Outcome: tolerated well, no immediate complications  Procedure: US-guided intra-articular hip injection, right After discussion on risks/benefits/indications and informed verbal consent was obtained, a timeout was performed. Patient was lying supine on exam table. The hip was cleaned with betadine and alcohol swabs. Then utilizing ultrasound guidance, the patient's femoral head and neck junction was identified and subsequently injected with 4:2 lidocaine:depomedrol via an in-plane approach with ultrasound visualization of the injectate administered into the hip joint. Patient tolerated procedure well without immediate complications.  Procedure, treatment alternatives, risks and benefits explained, specific risks discussed. Consent was given by the patient. Immediately prior to procedure a time out was called to verify the correct patient, procedure, equipment, support staff and site/side marked as required. Patient was prepped and draped in the usual sterile fashion.     - I evaluated the patient about 5 minutes post-injection and he had some improvement in pain and range of motion - follow-up with Dr. Roda Shutters as indicated; I am happy to see them as needed  Madelyn Brunner, DO Primary Care Sports Medicine Physician  [facility] - Orthopedics  This note was dictated using Dragon naturally speaking software and may contain errors in syntax, spelling, or content which have not been  identified prior to signing this note.

## 2022-11-14 ENCOUNTER — Other Ambulatory Visit: Payer: Self-pay | Admitting: Allergy

## 2022-11-14 ENCOUNTER — Other Ambulatory Visit: Payer: Self-pay | Admitting: Nurse Practitioner

## 2022-11-14 DIAGNOSIS — I10 Essential (primary) hypertension: Secondary | ICD-10-CM

## 2022-12-06 ENCOUNTER — Ambulatory Visit: Payer: Medicare HMO

## 2022-12-20 ENCOUNTER — Ambulatory Visit: Payer: Medicare HMO

## 2022-12-22 ENCOUNTER — Telehealth: Payer: Medicare HMO | Admitting: Physician Assistant

## 2022-12-22 DIAGNOSIS — F339 Major depressive disorder, recurrent, unspecified: Secondary | ICD-10-CM | POA: Diagnosis not present

## 2022-12-22 MED ORDER — ESCITALOPRAM OXALATE 20 MG PO TABS
20.0000 mg | ORAL_TABLET | Freq: Every day | ORAL | 0 refills | Status: DC
Start: 2022-12-22 — End: 2023-01-22

## 2022-12-22 NOTE — Patient Instructions (Signed)
Jordan [name], thank you for joining Margaretann Loveless, PA-C for today's virtual visit.  While this provider is not your primary care provider (PCP), if your PCP is located in our provider database this encounter information will be shared with them immediately following your visit.   A [facility] MyChart account gives you access to today's visit and all your visits, tests, and labs performed at [facility] " click here if you don't have a [facility] MyChart account or go to [email or web address]  Consent: (Patient) Jordan [name] provided verbal consent for this virtual visit at the beginning of the encounter.  Current Medications:  Current Outpatient Medications:    escitalopram (LEXAPRO) 20 MG tablet, Take 1 tablet (20 mg total) by mouth daily., Disp: 30 tablet, Rfl: 0   albuterol (PROVENTIL) (2.5 MG/3ML) 0.083% nebulizer solution, Take 3 mLs (2.5 mg total) by nebulization every 4 (four) hours as needed for wheezing or shortness of breath (coughing fits)., Disp: 75 mL, Rfl: 1   albuterol (VENTOLIN HFA) 108 (90 Base) MCG/ACT inhaler, Inhale 2 puffs into the lungs every 4 (four) hours as needed for wheezing or shortness of breath (coughing fits)., Disp: 18 g, Rfl: 1   amLODipine (NORVASC) 10 MG tablet, TAKE 1 TABLET(10 MG) BY MOUTH DAILY, Disp: 90 tablet, Rfl: 3   aspirin EC 81 MG tablet, Take 1 tablet (81 mg total) by mouth 2 (two) times daily. To be taken after surgery to prevent blood clots, Disp: 84 tablet, Rfl: 0   BREZTRI AEROSPHERE 160-9-4.8 MCG/ACT AERO, Inhale 2 puffs into the lungs in the morning and at bedtime. with spacer and rinse mouth afterwards., Disp: 32.1 g, Rfl: 1   buPROPion (WELLBUTRIN XL) 150 MG 24 hr tablet, TAKE 1 TABLET BY MOUTH EVERY DAY, Disp: 90 tablet, Rfl: 0   cyclobenzaprine (FLEXERIL) 10 MG tablet, Take 1 tablet (10 mg total) by mouth 2 (two) times daily as needed for muscle spasms., Disp: 20 tablet, Rfl: 0   docusate sodium (COLACE) 100  MG capsule, Take 1 capsule (100 mg total) by mouth daily as needed., Disp: 30 capsule, Rfl: 2   EPINEPHrine 0.3 mg/0.3 mL IJ SOAJ injection, INJECT 0.3 MLS INTO MUSCLE, Disp: 1 each, Rfl: 2   fenofibrate (TRICOR) 145 MG tablet, TAKE 1 TABLET(145 MG) BY MOUTH DAILY, Disp: 90 tablet, Rfl: 1   fluticasone (FLONASE) 50 MCG/ACT nasal spray, Place 2 sprays into both nostrils daily., Disp: 48 g, Rfl: 1   ipratropium (ATROVENT) 0.06 % nasal spray, Place 2 sprays in each nostril up to three times a day as needed, Disp: 90 mL, Rfl: 1   losartan (COZAAR) 25 MG tablet, Take 1 tablet (25 mg total) by mouth daily., Disp: 90 tablet, Rfl: 3   Mepolizumab (NUCALA) 100 MG/ML SOAJ, Inject 1 mL (100 mg total) into the skin every 28 (twenty-eight) days., Disp: 1 mL, Rfl: 11   montelukast (SINGULAIR) 10 MG tablet, Take 1 tablet (10 mg total) by mouth at bedtime., Disp: 90 tablet, Rfl: 1   ondansetron (ZOFRAN) 4 MG tablet, Take 1 tablet (4 mg total) by mouth every 8 (eight) hours as needed for nausea or vomiting., Disp: 21 tablet, Rfl: 0   ondansetron (ZOFRAN) 4 MG tablet, Take 1 tablet (4 mg total) by mouth every 8 (eight) hours as needed for nausea or vomiting., Disp: 40 tablet, Rfl: 0   oxyCODONE-acetaminophen (PERCOCET) 5-325 MG tablet, Take 1-2 tablets by mouth every 6 (six) hours as needed. To be taken  after surgery, Disp: 40 tablet, Rfl: 0   pantoprazole (PROTONIX) 40 MG tablet, TAKE 1/2 TABLET BY MOUTH DAILY, Disp: 45 tablet, Rfl: 0   traMADol (ULTRAM) 50 MG tablet, Take 1 tablet (50 mg total) by mouth 2 (two) times daily as needed., Disp: 30 tablet, Rfl: 0   trolamine salicylate (ASPERCREME) 10 % cream, Apply 1 Application topically as needed for muscle pain., Disp: , Rfl:   Current Facility-Administered Medications:    Mepolizumab SOLR 100 mg, 100 mg, Subcutaneous, Q28 days, Marcelyn Bruins, MD, 100 mg at 11/08/22 1752   Medications ordered in this encounter:  Meds ordered this encounter   Medications   escitalopram (LEXAPRO) 20 MG tablet    Sig: Take 1 tablet (20 mg total) by mouth daily.    Dispense:  30 tablet    Refill:  0    Order Specific Question:   Supervising Provider    Answer:   Merrilee Jansky [id number]     *If you need refills on other medications prior to your next appointment, please contact your pharmacy*  Follow-Up: Call back or seek an in-person evaluation if the symptoms worsen or if the condition fails to improve as anticipated.  [facility] [phone]  Other Instructions Escitalopram Tablets What is this medication? ESCITALOPRAM (es sye TAL oh pram) treats depression and anxiety. It increases the amount of serotonin in the brain, a hormone that helps regulate mood. It belongs to a group of medications called SSRIs. This medicine may be used for other purposes; ask your health care provider or pharmacist if you have questions. COMMON BRAND NAME(S): Lexapro, PramLyte What should I tell my care team before I take this medication? They need to know if you have any of these conditions: Bipolar disorder or a family history of bipolar disorder Diabetes Glaucoma Heart disease Kidney disease Liver disease Receiving electroconvulsive therapy Seizures Suicidal thoughts, plans, or attempt by you or a family member An unusual or allergic reaction to escitalopram, other medications, foods, dyes, or preservatives Pregnant or trying to become pregnant Breastfeeding How should I use this medication? Take this medication by mouth with a glass of water. Take it as directed on the prescription label at the same time every day. You can take it with or without food. If it upsets your stomach, take it with food. Do not take it more often than directed. Do not stop taking this medication suddenly except upon the advice of your care team. Stopping this medication too quickly may cause serious side effects or your condition may worsen. A special  MedGuide will be given to you by the pharmacist with each prescription and refill. Be sure to read this information carefully each time. Talk to your care team about the use of this medication in children. Special care may be needed. Overdosage: If you think you have taken too much of this medicine contact a poison control center or emergency room at once. NOTE: This medicine is only for you. Do not share this medicine with others. What if I miss a dose? If you miss a dose, take it as soon as you can. If it is almost time for your next dose, take only that dose. Do not take double or extra doses. What may interact with this medication? Do not take this medication with any of the following: Certain medications for fungal infections, such as fluconazole, itraconazole, ketoconazole, posaconazole, voriconazole Cisapride Citalopram Dronedarone Linezolid MAOIs, such as Carbex, Eldepryl, Marplan, Nardil, and Parnate  Methylene blue (injected into a vein) Pimozide Thioridazine This medication may also interact with the following: Alcohol Amphetamines Aspirin and aspirin-like medications Carbamazepine Certain medications for mental health conditions Certain medications for migraine headache, such as almotriptan, eletriptan, frovatriptan, naratriptan, rizatriptan, sumatriptan, zolmitriptan Certain medications for sleep Certain medications that treat or prevent blood clots, such as warfarin, enoxaparin, dalteparin Cimetidine Diuretics Dofetilide Fentanyl Furazolidone Isoniazid Lithium Metoprolol NSAIDs, medications for pain and inflammation, such as ibuprofen or naproxen Other medications that cause heart rhythm changes Procarbazine Rasagiline Supplements, such as St. John's wort, kava kava, valerian Tramadol Tryptophan Ziprasidone This list may not describe all possible interactions. Give your health care provider a list of all the medicines, herbs, non-prescription drugs, or dietary  supplements you use. Also tell them if you smoke, drink alcohol, or use illegal drugs. Some items may interact with your medicine. What should I watch for while using this medication? Tell your care team if your symptoms do not get better or if they get worse. Visit your care team for regular checks on your progress. Because it may take several weeks to see the full effects of this medication, it is important to continue your treatment as prescribed by your care team. Watch for new or worsening thoughts of suicide or depression. This includes sudden changes in mood, behaviors, or thoughts. These changes can happen at any time but are more common in the beginning of treatment or after a change in dose. Call your care team right away if you experience these thoughts or worsening depression. This medication may cause mood and behavior changes, such as anxiety, nervousness, irritability, hostility, restlessness, excitability, hyperactivity, or trouble sleeping. These changes can happen at any time but are more common in the beginning of treatment or after a change in dose. Call your care team right away if you notice any of these symptoms. This medication may affect your coordination, reaction time, or judgment. Do not drive or operate machinery until you know how this medication affects you. Sit up or stand slowly to reduce the risk of dizzy or fainting spells. Drinking alcohol with this medication can increase the risk of these side effects. Your mouth may get dry. Chewing sugarless gum or sucking hard candy and drinking plenty of water may help. Contact your care team if the problem does not go away or is severe. What side effects may I notice from receiving this medication? Side effects that you should report to your care team as soon as possible: Allergic reactions--skin rash, itching, hives, swelling of the face, lips, tongue, or throat Bleeding--bloody or black, tar-like stools, red or dark brown urine,  vomiting blood or brown material that looks like coffee grounds, small, red or purple spots on skin, unusual bleeding or bruising Heart rhythm changes--fast or irregular heartbeat, dizziness, feeling faint or lightheaded, chest pain, trouble breathing Low sodium level--muscle weakness, fatigue, dizziness, headache, confusion Serotonin syndrome--irritability, confusion, fast or irregular heartbeat, muscle stiffness, twitching muscles, sweating, high fever, seizure, chills, vomiting, diarrhea Sudden eye pain or change in vision such as blurry vision, seeing halos around lights, vision loss Thoughts of suicide or self-harm, worsening mood, feelings of depression Side effects that usually do not require medical attention (report to your care team if they continue or are bothersome): Change in sex drive or performance Diarrhea Excessive sweating Nausea Tremors or shaking Upset stomach This list may not describe all possible side effects. Call your doctor for medical advice about side effects. You may report side effects to FDA  at 1-800-FDA-1088. Where should I keep my medication? Keep out of reach of children and pets. Store at room temperature between 15 and 30 degrees C (59 and 86 degrees F). Throw away any unused medication after the expiration date. NOTE: This sheet is a summary. It may not cover all possible information. If you have questions about this medicine, talk to your doctor, pharmacist, or health care provider.  2024 Elsevier/Gold Standard (2022-02-19 00:00:00)    If you have been instructed to have an in-person evaluation today at a local Urgent Care facility, please use the link below. It will take you to a list of all of our available [facility], including address, phone number and hours of operation. Please do not delay care.  [facility]  If you or a family member do not have a primary care provider, use the link below to schedule a visit and  establish care. When you choose a [facility] primary care physician or advanced practice provider, you gain a long-term partner in health. Find a Primary Care Provider  Learn more about [facility]'s in-office and virtual care options: [facility] - Get Care Now

## 2022-12-22 NOTE — Progress Notes (Signed)
Virtual Visit Consent   [name], you are scheduled for a virtual visit with a [facility] provider today. Just as with appointments in the office, your consent must be obtained to participate. Your consent will be active for this visit and any virtual visit you may have with one of our providers in the next 365 days. If you have a MyChart account, a copy of this consent can be sent to you electronically.  As this is a virtual visit, video technology does not allow for your provider to perform a traditional examination. This may limit your provider's ability to fully assess your condition. If your provider identifies any concerns that need to be evaluated in person or the need to arrange testing (such as labs, EKG, etc.), we will make arrangements to do so. Although advances in technology are sophisticated, we cannot ensure that it will always work on either your end or our end. If the connection with a video visit is poor, the visit may have to be switched to a telephone visit. With either a video or telephone visit, we are not always able to ensure that we have a secure connection.  By engaging in this virtual visit, you consent to the provision of healthcare and authorize for your insurance to be billed (if applicable) for the services provided during this visit. Depending on your insurance coverage, you may receive a charge related to this service.  I need to obtain your verbal consent now. Are you willing to proceed with your visit today? [name] has provided verbal consent on 12/22/2022 for a virtual visit (video or telephone). Margaretann Loveless, PA-C  Date: 12/22/2022 7:04 PM  Virtual Visit via Video Note   I, Margaretann Loveless, connected with  Jordan [name]  ([id number], 07/02/1975) on 12/22/22 at  7:00 PM EDT by a video-enabled telemedicine application and verified that I am speaking with the correct person using two identifiers.  Location: Patient: Virtual Visit  Location Patient: Home Provider: Virtual Visit Location Provider: Home Office   I discussed the limitations of evaluation and management by telemedicine and the availability of in person appointments. The patient expressed understanding and agreed to proceed.    History of Present Illness: Jordan PATRICELLI [name] is a 47 y.o. who identifies as a male who was assigned male at birth, and is being seen today for medication refill. Reports he takes Bupropion XL 150mg  daily and escitalopram 10mg  Takes 2 tablets daily. He reports that the escitalopram was not wrote correctly the last time it was filled so he has been trying to stretch his prescription. He did drop to once daily dosing but still ran out of the medication just recently. He has been having headaches, fatigue, palpitations, increased anxiety, increased agitation and irritability without it. He does have an appt with a new PCP scheduled for 01/14/23. He reports his other medications are fine, he just needs the refill of the escitalopram.   Problems:  Patient Active Problem List   Diagnosis Date Noted   Not well controlled severe persistent asthma 06/11/2022   Non compliance with medical treatment 06/11/2022   Gastroesophageal reflux disease 06/11/2022   Recurrent infections 06/11/2022   Avascular necrosis of bone of right hip (HCC) 02/20/2022   Avascular necrosis of bone of left hip (HCC) 02/20/2022   Right groin pain 01/25/2022   Low testosterone in male 09/04/2021   Erectile dysfunction 11/02/2020   Depression, recurrent (HCC) 04/01/2020   Elevated  LDL cholesterol level 07/29/2019   SOB (shortness of breath) 07/27/2019   Seasonal and perennial allergic rhinoconjunctivitis 07/20/2019   Asthma 07/20/2019   Heartburn 07/20/2019   Hypertension 07/14/2019    Allergies: No Known Allergies Medications:  Current Outpatient Medications:    escitalopram (LEXAPRO) 20 MG tablet, Take 1 tablet (20 mg total) by mouth daily., Disp: 30 tablet, Rfl:  0   albuterol (PROVENTIL) (2.5 MG/3ML) 0.083% nebulizer solution, Take 3 mLs (2.5 mg total) by nebulization every 4 (four) hours as needed for wheezing or shortness of breath (coughing fits)., Disp: 75 mL, Rfl: 1   albuterol (VENTOLIN HFA) 108 (90 Base) MCG/ACT inhaler, Inhale 2 puffs into the lungs every 4 (four) hours as needed for wheezing or shortness of breath (coughing fits)., Disp: 18 g, Rfl: 1   amLODipine (NORVASC) 10 MG tablet, TAKE 1 TABLET(10 MG) BY MOUTH DAILY, Disp: 90 tablet, Rfl: 3   aspirin EC 81 MG tablet, Take 1 tablet (81 mg total) by mouth 2 (two) times daily. To be taken after surgery to prevent blood clots, Disp: 84 tablet, Rfl: 0   BREZTRI AEROSPHERE 160-9-4.8 MCG/ACT AERO, Inhale 2 puffs into the lungs in the morning and at bedtime. with spacer and rinse mouth afterwards., Disp: 32.1 g, Rfl: 1   buPROPion (WELLBUTRIN XL) 150 MG 24 hr tablet, TAKE 1 TABLET BY MOUTH EVERY DAY, Disp: 90 tablet, Rfl: 0   cyclobenzaprine (FLEXERIL) 10 MG tablet, Take 1 tablet (10 mg total) by mouth 2 (two) times daily as needed for muscle spasms., Disp: 20 tablet, Rfl: 0   docusate sodium (COLACE) 100 MG capsule, Take 1 capsule (100 mg total) by mouth daily as needed., Disp: 30 capsule, Rfl: 2   EPINEPHrine 0.3 mg/0.3 mL IJ SOAJ injection, INJECT 0.3 MLS INTO MUSCLE, Disp: 1 each, Rfl: 2   fenofibrate (TRICOR) 145 MG tablet, TAKE 1 TABLET(145 MG) BY MOUTH DAILY, Disp: 90 tablet, Rfl: 1   fluticasone (FLONASE) 50 MCG/ACT nasal spray, Place 2 sprays into both nostrils daily., Disp: 48 g, Rfl: 1   ipratropium (ATROVENT) 0.06 % nasal spray, Place 2 sprays in each nostril up to three times a day as needed, Disp: 90 mL, Rfl: 1   losartan (COZAAR) 25 MG tablet, Take 1 tablet (25 mg total) by mouth daily., Disp: 90 tablet, Rfl: 3   Mepolizumab (NUCALA) 100 MG/ML SOAJ, Inject 1 mL (100 mg total) into the skin every 28 (twenty-eight) days., Disp: 1 mL, Rfl: 11   montelukast (SINGULAIR) 10 MG tablet, Take 1  tablet (10 mg total) by mouth at bedtime., Disp: 90 tablet, Rfl: 1   ondansetron (ZOFRAN) 4 MG tablet, Take 1 tablet (4 mg total) by mouth every 8 (eight) hours as needed for nausea or vomiting., Disp: 21 tablet, Rfl: 0   ondansetron (ZOFRAN) 4 MG tablet, Take 1 tablet (4 mg total) by mouth every 8 (eight) hours as needed for nausea or vomiting., Disp: 40 tablet, Rfl: 0   oxyCODONE-acetaminophen (PERCOCET) 5-325 MG tablet, Take 1-2 tablets by mouth every 6 (six) hours as needed. To be taken after surgery, Disp: 40 tablet, Rfl: 0   pantoprazole (PROTONIX) 40 MG tablet, TAKE 1/2 TABLET BY MOUTH DAILY, Disp: 45 tablet, Rfl: 0   traMADol (ULTRAM) 50 MG tablet, Take 1 tablet (50 mg total) by mouth 2 (two) times daily as needed., Disp: 30 tablet, Rfl: 0   trolamine salicylate (ASPERCREME) 10 % cream, Apply 1 Application topically as needed for muscle pain., Disp: , Rfl:  Current Facility-Administered Medications:    Mepolizumab SOLR 100 mg, 100 mg, Subcutaneous, Q28 days, Marcelyn Bruins, MD, 100 mg at 11/08/22 1752  Observations/Objective: Patient is well-developed, well-nourished in no acute distress.  Resting comfortably at home.  Head is normocephalic, atraumatic.  No labored breathing.  Speech is clear and coherent with logical content.  Patient is alert and oriented at baseline.    Assessment and Plan: 1. Depression, recurrent (HCC) - escitalopram (LEXAPRO) 20 MG tablet; Take 1 tablet (20 mg total) by mouth daily.  Dispense: 30 tablet; Refill: 0  - Lexapro refilled x 30 days - Continue all medications as prescribed - Keep scheduled follow up with PCP on 01/14/23  Follow Up Instructions: I discussed the assessment and treatment plan with the patient. The patient was provided an opportunity to ask questions and all were answered. The patient agreed with the plan and demonstrated an understanding of the instructions.  A copy of instructions were sent to the patient via MyChart  unless otherwise noted below.    The patient was advised to call back or seek an in-person evaluation if the symptoms worsen or if the condition fails to improve as anticipated.  Time:  I spent 9 minutes with the patient via telehealth technology discussing the above problems/concerns.    Margaretann Loveless, PA-C

## 2022-12-24 ENCOUNTER — Ambulatory Visit: Payer: Medicare HMO

## 2023-01-03 ENCOUNTER — Ambulatory Visit: Payer: Medicare HMO

## 2023-01-10 ENCOUNTER — Other Ambulatory Visit: Payer: Self-pay | Admitting: Nurse Practitioner

## 2023-01-10 DIAGNOSIS — I10 Essential (primary) hypertension: Secondary | ICD-10-CM

## 2023-01-14 ENCOUNTER — Ambulatory Visit: Payer: Medicare HMO

## 2023-01-14 ENCOUNTER — Ambulatory Visit (INDEPENDENT_AMBULATORY_CARE_PROVIDER_SITE_OTHER): Payer: Medicare HMO | Admitting: Primary Care

## 2023-01-15 ENCOUNTER — Ambulatory Visit (INDEPENDENT_AMBULATORY_CARE_PROVIDER_SITE_OTHER): Payer: Medicare HMO | Admitting: *Deleted

## 2023-01-15 ENCOUNTER — Other Ambulatory Visit: Payer: Self-pay | Admitting: *Deleted

## 2023-01-15 DIAGNOSIS — J4550 Severe persistent asthma, uncomplicated: Secondary | ICD-10-CM

## 2023-01-15 MED ORDER — PANTOPRAZOLE SODIUM 40 MG PO TBEC: DELAYED_RELEASE_TABLET | ORAL | 1 refills | Status: DC

## 2023-01-22 ENCOUNTER — Telehealth: Payer: Medicare HMO | Admitting: Physician Assistant

## 2023-01-22 DIAGNOSIS — F339 Major depressive disorder, recurrent, unspecified: Secondary | ICD-10-CM

## 2023-01-22 MED ORDER — BUPROPION HCL ER (XL) 150 MG PO TB24
150.0000 mg | ORAL_TABLET | Freq: Every day | ORAL | 0 refills | Status: AC
Start: 2023-01-22 — End: ?

## 2023-01-22 MED ORDER — ESCITALOPRAM OXALATE 20 MG PO TABS: 20.0000 mg | ORAL_TABLET | Freq: Every day | ORAL | 0 refills | Status: DC

## 2023-01-22 NOTE — Progress Notes (Signed)
Virtual Visit Consent   Jordan [name], you are scheduled for a virtual visit with a [facility] provider today. Just as with appointments in the office, your consent must be obtained to participate. Your consent will be active for this visit and any virtual visit you may have with one of our providers in the next 365 days. If you have a MyChart account, a copy of this consent can be sent to you electronically.  As this is a virtual visit, video technology does not allow for your provider to perform a traditional examination. This may limit your provider's ability to fully assess your condition. If your provider identifies any concerns that need to be evaluated in person or the need to arrange testing (such as labs, EKG, etc.), we will make arrangements to do so. Although advances in technology are sophisticated, we cannot ensure that it will always work on either your end or our end. If the connection with a video visit is poor, the visit may have to be switched to a telephone visit. With either a video or telephone visit, we are not always able to ensure that we have a secure connection.  By engaging in this virtual visit, you consent to the provision of healthcare and authorize for your insurance to be billed (if applicable) for the services provided during this visit. Depending on your insurance coverage, you may receive a charge related to this service.  I need to obtain your verbal consent now. Are you willing to proceed with your visit today? Jordan [name] has provided verbal consent on 01/22/2023 for a virtual visit (video or telephone). Jordan [name], [place]  Date: 01/22/2023 7:29 PM  Virtual Visit via Video Note   I, Jordan [name], connected with  Jordan [name]  ([id number], 47-26-1977) on 01/22/23 at  7:30 PM EDT by a video-enabled telemedicine application and verified that I am speaking with the correct person using two identifiers.  Location: Patient: Virtual Visit  Location Patient: Home Provider: Virtual Visit Location Provider: Home Office   I discussed the limitations of evaluation and management by telemedicine and the availability of in person appointments. The patient expressed understanding and agreed to proceed.    History of Present Illness: Jordan [name] [name] is a 47 y.o. who identifies as a male who was assigned male at birth, and is being seen today for request of medication refill of anxiety/depression medications. Is completely out of Escitalopram 20 mg daily and almost out of his Bupropion 150 mg (4 tablets left). Already noting increase in anxiety off of his Escitalopram. Denies SI/HI. Was scheduled for 8/19 with new PCP but this was canceled/rescheduled and he was told he could not be seen until 10/27 -- Is currently on a cancellation list.   HPI: HPI  Problems:  Patient Active Problem List   Diagnosis Date Noted   Not well controlled severe persistent asthma 06/11/2022   Non compliance with medical treatment 06/11/2022   Gastroesophageal reflux disease 06/11/2022   Recurrent infections 06/11/2022   Avascular necrosis of bone of right hip (HCC) 02/20/2022   Avascular necrosis of bone of left hip (HCC) 02/20/2022   Right groin pain 01/25/2022   Low testosterone in male 09/04/2021   Erectile dysfunction 11/02/2020   Depression, recurrent (HCC) 04/01/2020   Elevated LDL cholesterol level 07/29/2019   SOB (shortness of breath) 07/27/2019   Seasonal and perennial allergic rhinoconjunctivitis 07/20/2019   Asthma 07/20/2019   Heartburn 07/20/2019  Hypertension 07/14/2019    Allergies: No Known Allergies Medications:  Current Outpatient Medications:    albuterol (PROVENTIL) (2.5 MG/3ML) 0.083% nebulizer solution, Take 3 mLs (2.5 mg total) by nebulization every 4 (four) hours as needed for wheezing or shortness of breath (coughing fits)., Disp: 75 mL, Rfl: 1   albuterol (VENTOLIN HFA) 108 (90 Base) MCG/ACT inhaler, Inhale 2 puffs into  the lungs every 4 (four) hours as needed for wheezing or shortness of breath (coughing fits)., Disp: 18 g, Rfl: 1   amLODipine (NORVASC) 10 MG tablet, TAKE 1 TABLET(10 MG) BY MOUTH DAILY, Disp: 90 tablet, Rfl: 3   aspirin EC 81 MG tablet, Take 1 tablet (81 mg total) by mouth 2 (two) times daily. To be taken after surgery to prevent blood clots, Disp: 84 tablet, Rfl: 0   BREZTRI AEROSPHERE 160-9-4.8 MCG/ACT AERO, Inhale 2 puffs into the lungs in the morning and at bedtime. with spacer and rinse mouth afterwards., Disp: 32.1 g, Rfl: 1   buPROPion (WELLBUTRIN XL) 150 MG 24 hr tablet, Take 1 tablet (150 mg total) by mouth daily., Disp: 30 tablet, Rfl: 0   cyclobenzaprine (FLEXERIL) 10 MG tablet, Take 1 tablet (10 mg total) by mouth 2 (two) times daily as needed for muscle spasms., Disp: 20 tablet, Rfl: 0   docusate sodium (COLACE) 100 MG capsule, Take 1 capsule (100 mg total) by mouth daily as needed., Disp: 30 capsule, Rfl: 2   EPINEPHrine 0.3 mg/0.3 mL IJ SOAJ injection, INJECT 0.3 MLS INTO MUSCLE, Disp: 1 each, Rfl: 2   escitalopram (LEXAPRO) 20 MG tablet, Take 1 tablet (20 mg total) by mouth daily., Disp: 30 tablet, Rfl: 0   fenofibrate (TRICOR) 145 MG tablet, TAKE 1 TABLET(145 MG) BY MOUTH DAILY, Disp: 90 tablet, Rfl: 1   fluticasone (FLONASE) 50 MCG/ACT nasal spray, Place 2 sprays into both nostrils daily., Disp: 48 g, Rfl: 1   ipratropium (ATROVENT) 0.06 % nasal spray, Place 2 sprays in each nostril up to three times a day as needed, Disp: 90 mL, Rfl: 1   losartan (COZAAR) 25 MG tablet, Take 1 tablet (25 mg total) by mouth daily., Disp: 90 tablet, Rfl: 3   Mepolizumab (NUCALA) 100 MG/ML SOAJ, Inject 1 mL (100 mg total) into the skin every 28 (twenty-eight) days., Disp: 1 mL, Rfl: 11   montelukast (SINGULAIR) 10 MG tablet, Take 1 tablet (10 mg total) by mouth at bedtime., Disp: 90 tablet, Rfl: 1   ondansetron (ZOFRAN) 4 MG tablet, Take 1 tablet (4 mg total) by mouth every 8 (eight) hours as needed  for nausea or vomiting., Disp: 40 tablet, Rfl: 0   pantoprazole (PROTONIX) 40 MG tablet, TAKE 1/2 TABLET BY MOUTH DAILY, Disp: 45 tablet, Rfl: 1   traMADol (ULTRAM) 50 MG tablet, Take 1 tablet (50 mg total) by mouth 2 (two) times daily as needed., Disp: 30 tablet, Rfl: 0   trolamine salicylate (ASPERCREME) 10 % cream, Apply 1 Application topically as needed for muscle pain., Disp: , Rfl:   Current Facility-Administered Medications:    Mepolizumab SOLR 100 mg, 100 mg, Subcutaneous, Q28 days, Marcelyn Bruins, MD, 100 mg at 01/15/23 1826  Observations/Objective: Patient is well-developed, well-nourished in no acute distress.  Resting comfortably at home.  Head is normocephalic, atraumatic.  No labored breathing. Speech is clear and coherent with logical content.  Patient is alert and oriented at baseline.   Assessment and Plan: 1. Depression, recurrent (HCC) - buPROPion (WELLBUTRIN XL) 150 MG 24 hr tablet; Take  1 tablet (150 mg total) by mouth daily.  Dispense: 30 tablet; Refill: 0 - escitalopram (LEXAPRO) 20 MG tablet; Take 1 tablet (20 mg total) by mouth daily.  Dispense: 30 tablet; Refill: 0  One-time refill of medications given. He is aware that if PCP cannot get him in before he is due for next fill of medicine, he will have to be evaluated at in-person urgent care.   Follow Up Instructions: I discussed the assessment and treatment plan with the patient. The patient was provided an opportunity to ask questions and all were answered. The patient agreed with the plan and demonstrated an understanding of the instructions.  A copy of instructions were sent to the patient via MyChart unless otherwise noted below.   The patient was advised to call back or seek an in-person evaluation if the symptoms worsen or if the condition fails to improve as anticipated.  Time:  I spent 10 minutes with the patient via telehealth technology discussing the above problems/concerns.    Jordan Climes, PA-C

## 2023-01-22 NOTE — Patient Instructions (Signed)
Jordan [name], thank you for joining Piedad Climes, PA-C for today's virtual visit.  While this provider is not your primary care provider (PCP), if your PCP is located in our provider database this encounter information will be shared with them immediately following your visit.   A [facility] MyChart account gives you access to today's visit and all your visits, tests, and labs performed at [facility] " click here if you don't have a [facility] MyChart account or go to [email or web address]  Consent: (Patient) Jordan [name] provided verbal consent for this virtual visit at the beginning of the encounter.  Current Medications:  Current Outpatient Medications:    albuterol (PROVENTIL) (2.5 MG/3ML) 0.083% nebulizer solution, Take 3 mLs (2.5 mg total) by nebulization every 4 (four) hours as needed for wheezing or shortness of breath (coughing fits)., Disp: 75 mL, Rfl: 1   albuterol (VENTOLIN HFA) 108 (90 Base) MCG/ACT inhaler, Inhale 2 puffs into the lungs every 4 (four) hours as needed for wheezing or shortness of breath (coughing fits)., Disp: 18 g, Rfl: 1   amLODipine (NORVASC) 10 MG tablet, TAKE 1 TABLET(10 MG) BY MOUTH DAILY, Disp: 90 tablet, Rfl: 3   aspirin EC 81 MG tablet, Take 1 tablet (81 mg total) by mouth 2 (two) times daily. To be taken after surgery to prevent blood clots, Disp: 84 tablet, Rfl: 0   BREZTRI AEROSPHERE 160-9-4.8 MCG/ACT AERO, Inhale 2 puffs into the lungs in the morning and at bedtime. with spacer and rinse mouth afterwards., Disp: 32.1 g, Rfl: 1   buPROPion (WELLBUTRIN XL) 150 MG 24 hr tablet, TAKE 1 TABLET BY MOUTH EVERY DAY, Disp: 90 tablet, Rfl: 0   cyclobenzaprine (FLEXERIL) 10 MG tablet, Take 1 tablet (10 mg total) by mouth 2 (two) times daily as needed for muscle spasms., Disp: 20 tablet, Rfl: 0   docusate sodium (COLACE) 100 MG capsule, Take 1 capsule (100 mg total) by mouth daily as needed., Disp: 30 capsule, Rfl: 2   EPINEPHrine  0.3 mg/0.3 mL IJ SOAJ injection, INJECT 0.3 MLS INTO MUSCLE, Disp: 1 each, Rfl: 2   escitalopram (LEXAPRO) 20 MG tablet, Take 1 tablet (20 mg total) by mouth daily., Disp: 30 tablet, Rfl: 0   fenofibrate (TRICOR) 145 MG tablet, TAKE 1 TABLET(145 MG) BY MOUTH DAILY, Disp: 90 tablet, Rfl: 1   fluticasone (FLONASE) 50 MCG/ACT nasal spray, Place 2 sprays into both nostrils daily., Disp: 48 g, Rfl: 1   ipratropium (ATROVENT) 0.06 % nasal spray, Place 2 sprays in each nostril up to three times a day as needed, Disp: 90 mL, Rfl: 1   losartan (COZAAR) 25 MG tablet, Take 1 tablet (25 mg total) by mouth daily., Disp: 90 tablet, Rfl: 3   Mepolizumab (NUCALA) 100 MG/ML SOAJ, Inject 1 mL (100 mg total) into the skin every 28 (twenty-eight) days., Disp: 1 mL, Rfl: 11   montelukast (SINGULAIR) 10 MG tablet, Take 1 tablet (10 mg total) by mouth at bedtime., Disp: 90 tablet, Rfl: 1   ondansetron (ZOFRAN) 4 MG tablet, Take 1 tablet (4 mg total) by mouth every 8 (eight) hours as needed for nausea or vomiting., Disp: 21 tablet, Rfl: 0   ondansetron (ZOFRAN) 4 MG tablet, Take 1 tablet (4 mg total) by mouth every 8 (eight) hours as needed for nausea or vomiting., Disp: 40 tablet, Rfl: 0   oxyCODONE-acetaminophen (PERCOCET) 5-325 MG tablet, Take 1-2 tablets by mouth every 6 (six) hours as needed. To be taken  after surgery, Disp: 40 tablet, Rfl: 0   pantoprazole (PROTONIX) 40 MG tablet, TAKE 1/2 TABLET BY MOUTH DAILY, Disp: 45 tablet, Rfl: 1   traMADol (ULTRAM) 50 MG tablet, Take 1 tablet (50 mg total) by mouth 2 (two) times daily as needed., Disp: 30 tablet, Rfl: 0   trolamine salicylate (ASPERCREME) 10 % cream, Apply 1 Application topically as needed for muscle pain., Disp: , Rfl:   Current Facility-Administered Medications:    Mepolizumab SOLR 100 mg, 100 mg, Subcutaneous, Q28 days, Marcelyn Bruins, MD, 100 mg at 01/15/23 1826   Medications ordered in this encounter:  No orders of the defined types were  placed in this encounter.    *If you need refills on other medications prior to your next appointment, please contact your pharmacy*  Follow-Up: Call back or seek an in-person evaluation if the symptoms worsen or if the condition fails to improve as anticipated.  [facility] [phone]  Other Instructions  If you have been instructed to have an in-person evaluation today at a local Urgent Care facility, please use the link below. It will take you to a list of all of our available [facility], including address, phone number and hours of operation. Please do not delay care.  [facility]  If you or a family member do not have a primary care provider, use the link below to schedule a visit and establish care. When you choose a [facility] primary care physician or advanced practice provider, you gain a long-term partner in health. Find a Primary Care Provider  Learn more about [facility]'s in-office and virtual care options:  - Get Care Now

## 2023-02-12 ENCOUNTER — Ambulatory Visit: Payer: Medicare HMO

## 2023-02-22 ENCOUNTER — Telehealth: Payer: Medicare HMO | Admitting: Family Medicine

## 2023-02-22 DIAGNOSIS — F339 Major depressive disorder, recurrent, unspecified: Secondary | ICD-10-CM | POA: Diagnosis not present

## 2023-02-22 MED ORDER — ESCITALOPRAM OXALATE 20 MG PO TABS
20.0000 mg | ORAL_TABLET | Freq: Every day | ORAL | 0 refills | Status: DC
Start: 2023-02-22 — End: 2023-03-27

## 2023-02-22 MED ORDER — BUPROPION HCL ER (XL) 150 MG PO TB24: 150.0000 mg | ORAL_TABLET | Freq: Every day | ORAL | 0 refills | Status: DC

## 2023-02-22 NOTE — Progress Notes (Signed)
Virtual Visit Consent   Jordan [name], you are scheduled for a virtual visit with a [facility] provider today. Just as with appointments in the office, your consent must be obtained to participate. Your consent will be active for this visit and any virtual visit you may have with one of our providers in the next 365 days. If you have a MyChart account, a copy of this consent can be sent to you electronically.  As this is a virtual visit, video technology does not allow for your provider to perform a traditional examination. This may limit your provider's ability to fully assess your condition. If your provider identifies any concerns that need to be evaluated in person or the need to arrange testing (such as labs, EKG, etc.), we will make arrangements to do so. Although advances in technology are sophisticated, we cannot ensure that it will always work on either your end or our end. If the connection with a video visit is poor, the visit may have to be switched to a telephone visit. With either a video or telephone visit, we are not always able to ensure that we have a secure connection.  By engaging in this virtual visit, you consent to the provision of healthcare and authorize for your insurance to be billed (if applicable) for the services provided during this visit. Depending on your insurance coverage, you may receive a charge related to this service.  I need to obtain your verbal consent now. Are you willing to proceed with your visit today? Jordan [name] has provided verbal consent on 02/22/2023 for a virtual visit (video or telephone). Georgana Curio, FNP  Date: 02/22/2023 3:18 PM  Virtual Visit via Video Note   I, Georgana Curio, connected with  Jordan [name]  ([id number], 05-07-1976) on 02/22/23 at  3:15 PM EDT by a video-enabled telemedicine application and verified that I am speaking with the correct person using two identifiers.  Location: Patient: Virtual Visit Location Patient:  Home Provider: Virtual Visit Location Provider: Home Office   I discussed the limitations of evaluation and management by telemedicine and the availability of in person appointments. The patient expressed understanding and agreed to proceed.    History of Present Illness: Jordan [name] is a 47 y.o. who identifies as a male who was assigned male at birth, and is being seen today for refill on bupropion and lexapro. He says he could not get apptmt with pcp until end of October and has not been able to move it up. He is stable on it.  HPI: HPI  Problems:  Patient Active Problem List   Diagnosis Date Noted   Not well controlled severe persistent asthma 06/11/2022   Non compliance with medical treatment 06/11/2022   Gastroesophageal reflux disease 06/11/2022   Recurrent infections 06/11/2022   Avascular necrosis of bone of right hip (HCC) 02/20/2022   Avascular necrosis of bone of left hip (HCC) 02/20/2022   Right groin pain 01/25/2022   Low testosterone in male 09/04/2021   Erectile dysfunction 11/02/2020   Depression, recurrent (HCC) 04/01/2020   Elevated LDL cholesterol level 07/29/2019   SOB (shortness of breath) 07/27/2019   Seasonal and perennial allergic rhinoconjunctivitis 07/20/2019   Asthma 07/20/2019   Heartburn 07/20/2019   Hypertension 07/14/2019    Allergies: No Known Allergies Medications:  Current Outpatient Medications:    albuterol (PROVENTIL) (2.5 MG/3ML) 0.083% nebulizer solution, Take 3 mLs (2.5 mg total) by nebulization every 4 (four) hours as  needed for wheezing or shortness of breath (coughing fits)., Disp: 75 mL, Rfl: 1   albuterol (VENTOLIN HFA) 108 (90 Base) MCG/ACT inhaler, Inhale 2 puffs into the lungs every 4 (four) hours as needed for wheezing or shortness of breath (coughing fits)., Disp: 18 g, Rfl: 1   amLODipine (NORVASC) 10 MG tablet, TAKE 1 TABLET(10 MG) BY MOUTH DAILY, Disp: 90 tablet, Rfl: 3   aspirin EC 81 MG tablet, Take 1 tablet (81 mg total)  by mouth 2 (two) times daily. To be taken after surgery to prevent blood clots, Disp: 84 tablet, Rfl: 0   BREZTRI AEROSPHERE 160-9-4.8 MCG/ACT AERO, Inhale 2 puffs into the lungs in the morning and at bedtime. with spacer and rinse mouth afterwards., Disp: 32.1 g, Rfl: 1   buPROPion (WELLBUTRIN XL) 150 MG 24 hr tablet, Take 1 tablet (150 mg total) by mouth daily., Disp: 30 tablet, Rfl: 0   cyclobenzaprine (FLEXERIL) 10 MG tablet, Take 1 tablet (10 mg total) by mouth 2 (two) times daily as needed for muscle spasms., Disp: 20 tablet, Rfl: 0   docusate sodium (COLACE) 100 MG capsule, Take 1 capsule (100 mg total) by mouth daily as needed., Disp: 30 capsule, Rfl: 2   EPINEPHrine 0.3 mg/0.3 mL IJ SOAJ injection, INJECT 0.3 MLS INTO MUSCLE, Disp: 1 each, Rfl: 2   escitalopram (LEXAPRO) 20 MG tablet, Take 1 tablet (20 mg total) by mouth daily., Disp: 30 tablet, Rfl: 0   fenofibrate (TRICOR) 145 MG tablet, TAKE 1 TABLET(145 MG) BY MOUTH DAILY, Disp: 90 tablet, Rfl: 1   fluticasone (FLONASE) 50 MCG/ACT nasal spray, Place 2 sprays into both nostrils daily., Disp: 48 g, Rfl: 1   ipratropium (ATROVENT) 0.06 % nasal spray, Place 2 sprays in each nostril up to three times a day as needed, Disp: 90 mL, Rfl: 1   losartan (COZAAR) 25 MG tablet, Take 1 tablet (25 mg total) by mouth daily., Disp: 90 tablet, Rfl: 3   Mepolizumab (NUCALA) 100 MG/ML SOAJ, Inject 1 mL (100 mg total) into the skin every 28 (twenty-eight) days., Disp: 1 mL, Rfl: 11   montelukast (SINGULAIR) 10 MG tablet, Take 1 tablet (10 mg total) by mouth at bedtime., Disp: 90 tablet, Rfl: 1   ondansetron (ZOFRAN) 4 MG tablet, Take 1 tablet (4 mg total) by mouth every 8 (eight) hours as needed for nausea or vomiting., Disp: 40 tablet, Rfl: 0   pantoprazole (PROTONIX) 40 MG tablet, TAKE 1/2 TABLET BY MOUTH DAILY, Disp: 45 tablet, Rfl: 1   traMADol (ULTRAM) 50 MG tablet, Take 1 tablet (50 mg total) by mouth 2 (two) times daily as needed., Disp: 30 tablet,  Rfl: 0   trolamine salicylate (ASPERCREME) 10 % cream, Apply 1 Application topically as needed for muscle pain., Disp: , Rfl:   Current Facility-Administered Medications:    Mepolizumab SOLR 100 mg, 100 mg, Subcutaneous, Q28 days, Marcelyn Bruins, MD, 100 mg at 01/15/23 1826  Observations/Objective: Patient is well-developed, well-nourished in no acute distress.  Resting comfortably  at home.  Head is normocephalic, atraumatic.  No labored breathing.  Speech is clear and coherent with logical content.  Patient is alert and oriented at baseline.   Assessment and Plan: 1. Depression, recurrent (HCC) - escitalopram (LEXAPRO) 20 MG tablet; Take 1 tablet (20 mg total) by mouth daily.  Dispense: 30 tablet; Refill: 0 - buPROPion (WELLBUTRIN XL) 150 MG 24 hr tablet; Take 1 tablet (150 mg total) by mouth daily.  Dispense: 30 tablet; Refill: 0  Keep apptmt October 27 as we will not be able to refill them again.   Follow Up Instructions: I discussed the assessment and treatment plan with the patient. The patient was provided an opportunity to ask questions and all were answered. The patient agreed with the plan and demonstrated an understanding of the instructions.  A copy of instructions were sent to the patient via MyChart unless otherwise noted below.     The patient was advised to call back or seek an in-person evaluation if the symptoms worsen or if the condition fails to improve as anticipated.  Time:  I spent 10 minutes with the patient via telehealth technology discussing the above problems/concerns.    Georgana Curio, FNP

## 2023-03-06 ENCOUNTER — Other Ambulatory Visit: Payer: Self-pay | Admitting: *Deleted

## 2023-03-06 MED ORDER — PANTOPRAZOLE SODIUM 40 MG PO TBEC: DELAYED_RELEASE_TABLET | ORAL | 1 refills | Status: DC

## 2023-03-11 ENCOUNTER — Ambulatory Visit: Payer: Medicare HMO

## 2023-03-25 ENCOUNTER — Ambulatory Visit: Payer: Medicare HMO

## 2023-03-26 ENCOUNTER — Telehealth (INDEPENDENT_AMBULATORY_CARE_PROVIDER_SITE_OTHER): Payer: Self-pay | Admitting: Primary Care

## 2023-03-26 NOTE — Telephone Encounter (Signed)
Called pt to inform them about moving apt to virtual.

## 2023-03-26 NOTE — Telephone Encounter (Signed)
Spoke to pt and will be at apt

## 2023-03-27 ENCOUNTER — Telehealth (INDEPENDENT_AMBULATORY_CARE_PROVIDER_SITE_OTHER): Payer: Medicare HMO | Admitting: Primary Care

## 2023-03-27 ENCOUNTER — Telehealth: Payer: Medicare HMO | Admitting: Physician Assistant

## 2023-03-27 DIAGNOSIS — F339 Major depressive disorder, recurrent, unspecified: Secondary | ICD-10-CM | POA: Diagnosis not present

## 2023-03-27 MED ORDER — BUPROPION HCL ER (XL) 150 MG PO TB24
150.0000 mg | ORAL_TABLET | Freq: Every day | ORAL | 0 refills | Status: DC
Start: 2023-03-27 — End: 2023-05-30

## 2023-03-27 MED ORDER — ESCITALOPRAM OXALATE 20 MG PO TABS: 20.0000 mg | ORAL_TABLET | Freq: Every day | ORAL | 0 refills | Status: DC

## 2023-03-27 NOTE — Patient Instructions (Signed)
Jordan [name], thank you for joining Piedad Climes, PA-C for today's virtual visit.  While this provider is not your primary care provider (PCP), if your PCP is located in our provider database this encounter information will be shared with them immediately following your visit.   A [facility] MyChart account gives you access to today's visit and all your visits, tests, and labs performed at [facility] " click here if you don't have a [facility] MyChart account or go to [email or web address]  Consent: (Patient) Jordan [name] provided verbal consent for this virtual visit at the beginning of the encounter.  Current Medications:  Current Outpatient Medications:    albuterol (PROVENTIL) (2.5 MG/3ML) 0.083% nebulizer solution, Take 3 mLs (2.5 mg total) by nebulization every 4 (four) hours as needed for wheezing or shortness of breath (coughing fits)., Disp: 75 mL, Rfl: 1   albuterol (VENTOLIN HFA) 108 (90 Base) MCG/ACT inhaler, Inhale 2 puffs into the lungs every 4 (four) hours as needed for wheezing or shortness of breath (coughing fits)., Disp: 18 g, Rfl: 1   amLODipine (NORVASC) 10 MG tablet, TAKE 1 TABLET(10 MG) BY MOUTH DAILY, Disp: 90 tablet, Rfl: 3   BREZTRI AEROSPHERE 160-9-4.8 MCG/ACT AERO, Inhale 2 puffs into the lungs in the morning and at bedtime. with spacer and rinse mouth afterwards., Disp: 32.1 g, Rfl: 1   buPROPion (WELLBUTRIN XL) 150 MG 24 hr tablet, Take 1 tablet (150 mg total) by mouth daily., Disp: 30 tablet, Rfl: 0   cyclobenzaprine (FLEXERIL) 10 MG tablet, Take 1 tablet (10 mg total) by mouth 2 (two) times daily as needed for muscle spasms., Disp: 20 tablet, Rfl: 0   EPINEPHrine 0.3 mg/0.3 mL IJ SOAJ injection, INJECT 0.3 MLS INTO MUSCLE, Disp: 1 each, Rfl: 2   escitalopram (LEXAPRO) 20 MG tablet, Take 1 tablet (20 mg total) by mouth daily., Disp: 30 tablet, Rfl: 0   fenofibrate (TRICOR) 145 MG tablet, TAKE 1 TABLET(145 MG) BY MOUTH DAILY, Disp:  90 tablet, Rfl: 1   fluticasone (FLONASE) 50 MCG/ACT nasal spray, Place 2 sprays into both nostrils daily., Disp: 48 g, Rfl: 1   ipratropium (ATROVENT) 0.06 % nasal spray, Place 2 sprays in each nostril up to three times a day as needed, Disp: 90 mL, Rfl: 1   losartan (COZAAR) 25 MG tablet, Take 1 tablet (25 mg total) by mouth daily., Disp: 90 tablet, Rfl: 3   Mepolizumab (NUCALA) 100 MG/ML SOAJ, Inject 1 mL (100 mg total) into the skin every 28 (twenty-eight) days., Disp: 1 mL, Rfl: 11   montelukast (SINGULAIR) 10 MG tablet, Take 1 tablet (10 mg total) by mouth at bedtime., Disp: 90 tablet, Rfl: 1   ondansetron (ZOFRAN) 4 MG tablet, Take 1 tablet (4 mg total) by mouth every 8 (eight) hours as needed for nausea or vomiting., Disp: 40 tablet, Rfl: 0   pantoprazole (PROTONIX) 40 MG tablet, TAKE 1/2 TABLET BY MOUTH DAILY, Disp: 45 tablet, Rfl: 1   traMADol (ULTRAM) 50 MG tablet, Take 1 tablet (50 mg total) by mouth 2 (two) times daily as needed., Disp: 30 tablet, Rfl: 0   trolamine salicylate (ASPERCREME) 10 % cream, Apply 1 Application topically as needed for muscle pain., Disp: , Rfl:   Current Facility-Administered Medications:    Mepolizumab SOLR 100 mg, 100 mg, Subcutaneous, Q28 days, Marcelyn Bruins, MD, 100 mg at 01/15/23 1826   Medications ordered in this encounter:  No orders of the defined types were placed  in this encounter.    *If you need refills on other medications prior to your next appointment, please contact your pharmacy*  Follow-Up: Call back or seek an in-person evaluation if the symptoms worsen or if the condition fails to improve as anticipated.  [facility] [phone]  Other Instructions If unable to get back in with your new PCP within 2 weeks, please be evaluated at [facility] at [address], [place] [address] [phone]. Open 24/7   If you have been instructed to have an in-person evaluation today at a  local Urgent Care facility, please use the link below. It will take you to a list of all of our available [facility], including address, phone number and hours of operation. Please do not delay care.  [facility]  If you or a family member do not have a primary care provider, use the link below to schedule a visit and establish care. When you choose a [facility] primary care physician or advanced practice provider, you gain a long-term partner in health. Find a Primary Care Provider  Learn more about [facility]'s in-office and virtual care options:  - Get Care Now

## 2023-03-27 NOTE — Progress Notes (Signed)
Virtual Visit Consent   Jordan [name], you are scheduled for a virtual visit with a [facility] provider today. Just as with appointments in the office, your consent must be obtained to participate. Your consent will be active for this visit and any virtual visit you may have with one of our providers in the next 365 days. If you have a MyChart account, a copy of this consent can be sent to you electronically.  As this is a virtual visit, video technology does not allow for your provider to perform a traditional examination. This may limit your provider's ability to fully assess your condition. If your provider identifies any concerns that need to be evaluated in person or the need to arrange testing (such as labs, EKG, etc.), we will make arrangements to do so. Although advances in technology are sophisticated, we cannot ensure that it will always work on either your end or our end. If the connection with a video visit is poor, the visit may have to be switched to a telephone visit. With either a video or telephone visit, we are not always able to ensure that we have a secure connection.  By engaging in this virtual visit, you consent to the provision of healthcare and authorize for your insurance to be billed (if applicable) for the services provided during this visit. Depending on your insurance coverage, you may receive a charge related to this service.  I need to obtain your verbal consent now. Are you willing to proceed with your visit today? Jordan [name] has provided verbal consent on 03/27/2023 for a virtual visit (video or telephone). Piedad Climes, [place]  Date: 03/27/2023 7:47 PM  Virtual Visit via Video Note   I, Piedad Climes, connected with  Jordan [name]  ([id number], [birth year]) on 03/27/23 at  7:45 PM EDT by a video-enabled telemedicine application and verified that I am speaking with the correct person using two identifiers.  Location: Patient: Virtual Visit  Location Patient: Home Provider: Virtual Visit Location Provider: Home Office   I discussed the limitations of evaluation and management by telemedicine and the availability of in person appointments. The patient expressed understanding and agreed to proceed.    History of Present Illness: Jordan [name] is a 47 y.o. who identifies as a male who was assigned male at birth, and is being seen today for request of additional medication refill. Is completely out of his Bupropion and Escitalopram which he takes for anxiety and depression. Previously given a couple of months of refills by our department and instructed he would have to be evaluated in person as no further refills could be given. Was actually scheduled today with a new PCP at [facility] but his daughter went into labor so he was unable to attend. Notes he tried to cancel appt via MyChart and reschedule but it would not let him. Marland Kitchen  HPI: HPI  Problems:  Patient Active Problem List   Diagnosis Date Noted   Not well controlled severe persistent asthma 06/11/2022   Non compliance with medical treatment 06/11/2022   Gastroesophageal reflux disease 06/11/2022   Recurrent infections 06/11/2022   Avascular necrosis of bone of right hip (HCC) 02/20/2022   Avascular necrosis of bone of left hip (HCC) 02/20/2022   Right groin pain 01/25/2022   Low testosterone in male 09/04/2021   Erectile dysfunction 11/02/2020   Depression, recurrent (HCC) 04/01/2020   Elevated LDL cholesterol level 07/29/2019   SOB (shortness of  breath) 07/27/2019   Seasonal and perennial allergic rhinoconjunctivitis 07/20/2019   Asthma 07/20/2019   Heartburn 07/20/2019   Hypertension 07/14/2019    Allergies: No Known Allergies Medications:  Current Outpatient Medications:    albuterol (PROVENTIL) (2.5 MG/3ML) 0.083% nebulizer solution, Take 3 mLs (2.5 mg total) by nebulization every 4 (four) hours as needed for wheezing or shortness of breath (coughing fits)., Disp: 75  mL, Rfl: 1   albuterol (VENTOLIN HFA) 108 (90 Base) MCG/ACT inhaler, Inhale 2 puffs into the lungs every 4 (four) hours as needed for wheezing or shortness of breath (coughing fits)., Disp: 18 g, Rfl: 1   amLODipine (NORVASC) 10 MG tablet, TAKE 1 TABLET(10 MG) BY MOUTH DAILY, Disp: 90 tablet, Rfl: 3   BREZTRI AEROSPHERE 160-9-4.8 MCG/ACT AERO, Inhale 2 puffs into the lungs in the morning and at bedtime. with spacer and rinse mouth afterwards., Disp: 32.1 g, Rfl: 1   buPROPion (WELLBUTRIN XL) 150 MG 24 hr tablet, Take 1 tablet (150 mg total) by mouth daily., Disp: 15 tablet, Rfl: 0   cyclobenzaprine (FLEXERIL) 10 MG tablet, Take 1 tablet (10 mg total) by mouth 2 (two) times daily as needed for muscle spasms., Disp: 20 tablet, Rfl: 0   EPINEPHrine 0.3 mg/0.3 mL IJ SOAJ injection, INJECT 0.3 MLS INTO MUSCLE, Disp: 1 each, Rfl: 2   escitalopram (LEXAPRO) 20 MG tablet, Take 1 tablet (20 mg total) by mouth daily., Disp: 15 tablet, Rfl: 0   fenofibrate (TRICOR) 145 MG tablet, TAKE 1 TABLET(145 MG) BY MOUTH DAILY, Disp: 90 tablet, Rfl: 1   fluticasone (FLONASE) 50 MCG/ACT nasal spray, Place 2 sprays into both nostrils daily., Disp: 48 g, Rfl: 1   ipratropium (ATROVENT) 0.06 % nasal spray, Place 2 sprays in each nostril up to three times a day as needed, Disp: 90 mL, Rfl: 1   losartan (COZAAR) 25 MG tablet, Take 1 tablet (25 mg total) by mouth daily., Disp: 90 tablet, Rfl: 3   Mepolizumab (NUCALA) 100 MG/ML SOAJ, Inject 1 mL (100 mg total) into the skin every 28 (twenty-eight) days., Disp: 1 mL, Rfl: 11   montelukast (SINGULAIR) 10 MG tablet, Take 1 tablet (10 mg total) by mouth at bedtime., Disp: 90 tablet, Rfl: 1   ondansetron (ZOFRAN) 4 MG tablet, Take 1 tablet (4 mg total) by mouth every 8 (eight) hours as needed for nausea or vomiting., Disp: 40 tablet, Rfl: 0   pantoprazole (PROTONIX) 40 MG tablet, TAKE 1/2 TABLET BY MOUTH DAILY, Disp: 45 tablet, Rfl: 1   traMADol (ULTRAM) 50 MG tablet, Take 1 tablet  (50 mg total) by mouth 2 (two) times daily as needed., Disp: 30 tablet, Rfl: 0   trolamine salicylate (ASPERCREME) 10 % cream, Apply 1 Application topically as needed for muscle pain., Disp: , Rfl:   Current Facility-Administered Medications:    Mepolizumab SOLR 100 mg, 100 mg, Subcutaneous, Q28 days, Marcelyn Bruins, MD, 100 mg at 01/15/23 1826  Observations/Objective: Patient is well-developed, well-nourished in no acute distress.  Resting comfortably at home.  Head is normocephalic, atraumatic.  No labored breathing. Speech is clear and coherent with logical content.  Patient is alert and oriented at baseline.   Assessment and Plan: 1. Depression, recurrent (HCC) - buPROPion (WELLBUTRIN XL) 150 MG 24 hr tablet; Take 1 tablet (150 mg total) by mouth daily.  Dispense: 15 tablet; Refill: 0 - escitalopram (LEXAPRO) 20 MG tablet; Take 1 tablet (20 mg total) by mouth daily.  Dispense: 15 tablet; Refill: 0  Will  give 15 day supply to give him time to attempt to reschedule new patient appt with new PCP office. If unable to get in at that time, will have [facility] for additional [facility] management.  Follow Up Instructions: I discussed the assessment and treatment plan with the patient. The patient was provided an opportunity to ask questions and all were answered. The patient agreed with the plan and demonstrated an understanding of the instructions.  A copy of instructions were sent to the patient via MyChart unless otherwise noted below.   The patient was advised to call back or seek an in-person evaluation if the symptoms worsen or if the condition fails to improve as anticipated.    Piedad Climes, PA-C

## 2023-03-27 NOTE — Progress Notes (Deleted)
Sent patient a link than called and left message.

## 2023-04-01 ENCOUNTER — Ambulatory Visit: Payer: Medicare HMO

## 2023-04-10 ENCOUNTER — Telehealth: Payer: Medicare HMO | Admitting: Physician Assistant

## 2023-04-10 DIAGNOSIS — F32A Depression, unspecified: Secondary | ICD-10-CM

## 2023-04-10 DIAGNOSIS — F419 Anxiety disorder, unspecified: Secondary | ICD-10-CM

## 2023-04-10 NOTE — Progress Notes (Signed)
Virtual Visit Consent   Jordan [name], you are scheduled for a virtual visit with a [facility] provider today. Just as with appointments in the office, your consent must be obtained to participate. Your consent will be active for this visit and any virtual visit you may have with one of our providers in the next 365 days. If you have a MyChart account, a copy of this consent can be sent to you electronically.  As this is a virtual visit, video technology does not allow for your provider to perform a traditional examination. This may limit your provider's ability to fully assess your condition. If your provider identifies any concerns that need to be evaluated in person or the need to arrange testing (such as labs, EKG, etc.), we will make arrangements to do so. Although advances in technology are sophisticated, we cannot ensure that it will always work on either your end or our end. If the connection with a video visit is poor, the visit may have to be switched to a telephone visit. With either a video or telephone visit, we are not always able to ensure that we have a secure connection.  By engaging in this virtual visit, you consent to the provision of healthcare and authorize for your insurance to be billed (if applicable) for the services provided during this visit. Depending on your insurance coverage, you may receive a charge related to this service.  I need to obtain your verbal consent now. Are you willing to proceed with your visit today? Jordan [name] has provided verbal consent on 04/10/2023 for a virtual visit (video or telephone). Jordan [name], [place]  Date: 04/10/2023 7:48 PM  Virtual Visit via Video Note   I, Jordan [name], connected with  Jordan [name]  ([id number], [birth year]) on 04/10/23 at  7:45 PM EST by a video-enabled telemedicine application and verified that I am speaking with the correct person using two identifiers.  Location: Patient: Virtual Visit  Location Patient: Home Provider: Virtual Visit Location Provider: Home Office   I discussed the limitations of evaluation and management by telemedicine and the availability of in person appointments. The patient expressed understanding and agreed to proceed.    History of Present Illness: Jordan [name] is a 47 y.o. who identifies as a male who was assigned male at birth, and is being seen today for need of refills and ongoing management of chronic anxiety and depression. Has been seen for this issue dating back ~ 5 months. Has no showed with a new PCP last month and was given a final 15-day supply of medications until he could either reschedule with PCP or be evaluated in person at urgent care. Has not done this. Has been splitting pills in half instead to make last longer. Notes increased anxiety levels over past few days with some lucid dreaming. Denies SI/HI, aggressive behavior.   HPI: HPI  Problems:  Patient Active Problem List   Diagnosis Date Noted   Not well controlled severe persistent asthma 06/11/2022   Non compliance with medical treatment 06/11/2022   Gastroesophageal reflux disease 06/11/2022   Recurrent infections 06/11/2022   Avascular necrosis of bone of right hip (HCC) 02/20/2022   Avascular necrosis of bone of left hip (HCC) 02/20/2022   Right groin pain 01/25/2022   Low testosterone in male 09/04/2021   Erectile dysfunction 11/02/2020   Depression, recurrent (HCC) 04/01/2020   Elevated LDL cholesterol level 07/29/2019   SOB (shortness of  breath) 07/27/2019   Seasonal and perennial allergic rhinoconjunctivitis 07/20/2019   Asthma 07/20/2019   Heartburn 07/20/2019   Hypertension 07/14/2019    Allergies: No Known Allergies Medications:  Current Outpatient Medications:    albuterol (PROVENTIL) (2.5 MG/3ML) 0.083% nebulizer solution, Take 3 mLs (2.5 mg total) by nebulization every 4 (four) hours as needed for wheezing or shortness of breath (coughing fits)., Disp:  75 mL, Rfl: 1   albuterol (VENTOLIN HFA) 108 (90 Base) MCG/ACT inhaler, Inhale 2 puffs into the lungs every 4 (four) hours as needed for wheezing or shortness of breath (coughing fits)., Disp: 18 g, Rfl: 1   amLODipine (NORVASC) 10 MG tablet, TAKE 1 TABLET(10 MG) BY MOUTH DAILY, Disp: 90 tablet, Rfl: 3   BREZTRI AEROSPHERE 160-9-4.8 MCG/ACT AERO, Inhale 2 puffs into the lungs in the morning and at bedtime. with spacer and rinse mouth afterwards., Disp: 32.1 g, Rfl: 1   buPROPion (WELLBUTRIN XL) 150 MG 24 hr tablet, Take 1 tablet (150 mg total) by mouth daily., Disp: 15 tablet, Rfl: 0   cyclobenzaprine (FLEXERIL) 10 MG tablet, Take 1 tablet (10 mg total) by mouth 2 (two) times daily as needed for muscle spasms., Disp: 20 tablet, Rfl: 0   EPINEPHrine 0.3 mg/0.3 mL IJ SOAJ injection, INJECT 0.3 MLS INTO MUSCLE, Disp: 1 each, Rfl: 2   escitalopram (LEXAPRO) 20 MG tablet, Take 1 tablet (20 mg total) by mouth daily., Disp: 15 tablet, Rfl: 0   fenofibrate (TRICOR) 145 MG tablet, TAKE 1 TABLET(145 MG) BY MOUTH DAILY, Disp: 90 tablet, Rfl: 1   fluticasone (FLONASE) 50 MCG/ACT nasal spray, Place 2 sprays into both nostrils daily., Disp: 48 g, Rfl: 1   ipratropium (ATROVENT) 0.06 % nasal spray, Place 2 sprays in each nostril up to three times a day as needed, Disp: 90 mL, Rfl: 1   losartan (COZAAR) 25 MG tablet, Take 1 tablet (25 mg total) by mouth daily., Disp: 90 tablet, Rfl: 3   Mepolizumab (NUCALA) 100 MG/ML SOAJ, Inject 1 mL (100 mg total) into the skin every 28 (twenty-eight) days., Disp: 1 mL, Rfl: 11   montelukast (SINGULAIR) 10 MG tablet, Take 1 tablet (10 mg total) by mouth at bedtime., Disp: 90 tablet, Rfl: 1   ondansetron (ZOFRAN) 4 MG tablet, Take 1 tablet (4 mg total) by mouth every 8 (eight) hours as needed for nausea or vomiting., Disp: 40 tablet, Rfl: 0   pantoprazole (PROTONIX) 40 MG tablet, TAKE 1/2 TABLET BY MOUTH DAILY, Disp: 45 tablet, Rfl: 1   traMADol (ULTRAM) 50 MG tablet, Take 1 tablet  (50 mg total) by mouth 2 (two) times daily as needed., Disp: 30 tablet, Rfl: 0   trolamine salicylate (ASPERCREME) 10 % cream, Apply 1 Application topically as needed for muscle pain., Disp: , Rfl:   Current Facility-Administered Medications:    Mepolizumab SOLR 100 mg, 100 mg, Subcutaneous, Q28 days, Marcelyn Bruins, MD, 100 mg at 01/15/23 1826  Observations/Objective: Patient is well-developed, well-nourished in no acute distress.  Resting comfortably  at home.  Head is normocephalic, atraumatic.  No labored breathing.  Speech is clear and coherent with logical content.  Patient is alert and oriented at baseline.   Assessment and Plan: 1. Anxiety and depression  Discussed as noted multiple times previously we cannot and will not provide further refills of medications. If unable to reschedule with PCP, he is to be evaluated at [facility]. Resources given. He agrees to call them now to get an  evaluation so he can get medication refills/adjustments until re-establishing with PCP.  Follow Up Instructions: I discussed the assessment and treatment plan with the patient. The patient was provided an opportunity to ask questions and all were answered. The patient agreed with the plan and demonstrated an understanding of the instructions.  A copy of instructions were sent to the patient via MyChart unless otherwise noted below.   The patient was advised to call back or seek an in-person evaluation if the symptoms worsen or if the condition fails to improve as anticipated.    Jordan Climes, PA-C

## 2023-04-10 NOTE — Patient Instructions (Signed)
Jordan [name], thank you for joining Piedad Climes, PA-C for today's virtual visit.  While this provider is not your primary care provider (PCP), if your PCP is located in our provider database this encounter information will be shared with them immediately following your visit.   A [place] MyChart account gives you access to today's visit and all your visits, tests, and labs performed at [facility] " click here if you don't have a [facility] MyChart account or go to [email or web address]  Consent: (Patient) Jordan [name] provided verbal consent for this virtual visit at the beginning of the encounter.  Current Medications:  Current Outpatient Medications:    albuterol (PROVENTIL) (2.5 MG/3ML) 0.083% nebulizer solution, Take 3 mLs (2.5 mg total) by nebulization every 4 (four) hours as needed for wheezing or shortness of breath (coughing fits)., Disp: 75 mL, Rfl: 1   albuterol (VENTOLIN HFA) 108 (90 Base) MCG/ACT inhaler, Inhale 2 puffs into the lungs every 4 (four) hours as needed for wheezing or shortness of breath (coughing fits)., Disp: 18 g, Rfl: 1   amLODipine (NORVASC) 10 MG tablet, TAKE 1 TABLET(10 MG) BY MOUTH DAILY, Disp: 90 tablet, Rfl: 3   BREZTRI AEROSPHERE 160-9-4.8 MCG/ACT AERO, Inhale 2 puffs into the lungs in the morning and at bedtime. with spacer and rinse mouth afterwards., Disp: 32.1 g, Rfl: 1   buPROPion (WELLBUTRIN XL) 150 MG 24 hr tablet, Take 1 tablet (150 mg total) by mouth daily., Disp: 15 tablet, Rfl: 0   cyclobenzaprine (FLEXERIL) 10 MG tablet, Take 1 tablet (10 mg total) by mouth 2 (two) times daily as needed for muscle spasms., Disp: 20 tablet, Rfl: 0   EPINEPHrine 0.3 mg/0.3 mL IJ SOAJ injection, INJECT 0.3 MLS INTO MUSCLE, Disp: 1 each, Rfl: 2   escitalopram (LEXAPRO) 20 MG tablet, Take 1 tablet (20 mg total) by mouth daily., Disp: 15 tablet, Rfl: 0   fenofibrate (TRICOR) 145 MG tablet, TAKE 1 TABLET(145 MG) BY MOUTH DAILY, Disp:  90 tablet, Rfl: 1   fluticasone (FLONASE) 50 MCG/ACT nasal spray, Place 2 sprays into both nostrils daily., Disp: 48 g, Rfl: 1   ipratropium (ATROVENT) 0.06 % nasal spray, Place 2 sprays in each nostril up to three times a day as needed, Disp: 90 mL, Rfl: 1   losartan (COZAAR) 25 MG tablet, Take 1 tablet (25 mg total) by mouth daily., Disp: 90 tablet, Rfl: 3   Mepolizumab (NUCALA) 100 MG/ML SOAJ, Inject 1 mL (100 mg total) into the skin every 28 (twenty-eight) days., Disp: 1 mL, Rfl: 11   montelukast (SINGULAIR) 10 MG tablet, Take 1 tablet (10 mg total) by mouth at bedtime., Disp: 90 tablet, Rfl: 1   ondansetron (ZOFRAN) 4 MG tablet, Take 1 tablet (4 mg total) by mouth every 8 (eight) hours as needed for nausea or vomiting., Disp: 40 tablet, Rfl: 0   pantoprazole (PROTONIX) 40 MG tablet, TAKE 1/2 TABLET BY MOUTH DAILY, Disp: 45 tablet, Rfl: 1   traMADol (ULTRAM) 50 MG tablet, Take 1 tablet (50 mg total) by mouth 2 (two) times daily as needed., Disp: 30 tablet, Rfl: 0   trolamine salicylate (ASPERCREME) 10 % cream, Apply 1 Application topically as needed for muscle pain., Disp: , Rfl:   Current Facility-Administered Medications:    Mepolizumab SOLR 100 mg, 100 mg, Subcutaneous, Q28 days, Marcelyn Bruins, MD, 100 mg at 01/15/23 1826   Medications ordered in this encounter:  No orders of the defined types were placed  in this encounter.    *If you need refills on other medications prior to your next appointment, please contact your pharmacy*  Follow-Up: Call back or seek an in-person evaluation if the symptoms worsen or if the condition fails to improve as anticipated.  [facility] [phone]  Other Instructions [facility] [phone] [address] [place], [place] [address]   If you have been instructed to have an in-person evaluation today at a local Urgent Care facility, please use the link below. It will take you to a list of all  of our available [facility], including address, phone number and hours of operation. Please do not delay care.  [facility]  If you or a family member do not have a primary care provider, use the link below to schedule a visit and establish care. When you choose a [facility] primary care physician or advanced practice provider, you gain a long-term partner in health. Find a Primary Care Provider  Learn more about [facility] in-office and virtual care options: [facility] - Get Care Now

## 2023-04-15 ENCOUNTER — Ambulatory Visit: Payer: Medicare HMO

## 2023-04-22 DIAGNOSIS — F419 Anxiety disorder, unspecified: Secondary | ICD-10-CM | POA: Diagnosis not present

## 2023-04-22 DIAGNOSIS — F32A Depression, unspecified: Secondary | ICD-10-CM | POA: Diagnosis not present

## 2023-05-02 ENCOUNTER — Ambulatory Visit: Payer: Medicare HMO | Admitting: Allergy

## 2023-05-06 ENCOUNTER — Ambulatory Visit: Payer: Medicare HMO | Admitting: Sports Medicine

## 2023-05-07 ENCOUNTER — Ambulatory Visit: Payer: Medicare HMO | Admitting: Family

## 2023-05-30 ENCOUNTER — Telehealth: Payer: Medicare HMO | Admitting: Nurse Practitioner

## 2023-05-30 DIAGNOSIS — F339 Major depressive disorder, recurrent, unspecified: Secondary | ICD-10-CM

## 2023-05-30 DIAGNOSIS — I10 Essential (primary) hypertension: Secondary | ICD-10-CM | POA: Diagnosis not present

## 2023-05-30 DIAGNOSIS — K219 Gastro-esophageal reflux disease without esophagitis: Secondary | ICD-10-CM | POA: Diagnosis not present

## 2023-05-30 MED ORDER — PANTOPRAZOLE SODIUM 40 MG PO TBEC
DELAYED_RELEASE_TABLET | ORAL | 0 refills | Status: DC
Start: 2023-05-30 — End: 2023-06-13

## 2023-05-30 MED ORDER — AMLODIPINE BESYLATE 10 MG PO TABS
10.0000 mg | ORAL_TABLET | Freq: Every day | ORAL | 0 refills | Status: DC
Start: 2023-05-30 — End: 2023-07-31

## 2023-05-30 MED ORDER — BUPROPION HCL ER (XL) 150 MG PO TB24
150.0000 mg | ORAL_TABLET | Freq: Every day | ORAL | 0 refills | Status: DC
Start: 2023-05-30 — End: 2023-07-01

## 2023-05-30 NOTE — Progress Notes (Signed)
 Virtual Visit Consent   Jordan [name], you are scheduled for a virtual visit with a [facility] provider today. Just as with appointments in the office, your consent must be obtained to participate. Your consent will be active for this visit and any virtual visit you may have with one of our providers in the next 365 days. If you have a MyChart account, a copy of this consent can be sent to you electronically.  As this is a virtual visit, video technology does not allow for your provider to perform a traditional examination. This may limit your provider's ability to fully assess your condition. If your provider identifies any concerns that need to be evaluated in person or the need to arrange testing (such as labs, EKG, etc.), we will make arrangements to do so. Although advances in technology are sophisticated, we cannot ensure that it will always work on either your end or our end. If the connection with a video visit is poor, the visit may have to be switched to a telephone visit. With either a video or telephone visit, we are not always able to ensure that we have a secure connection.  By engaging in this virtual visit, you consent to the provision of healthcare and authorize for your insurance to be billed (if applicable) for the services provided during this visit. Depending on your insurance coverage, you may receive a charge related to this service.  I need to obtain your verbal consent now. Are you willing to proceed with your visit today? Jordan [name] has provided verbal consent on 05/30/2023 for a virtual visit (video or telephone). Lauraine Kitty, FNP  Date: 05/30/2023 6:10 PM  Virtual Visit via Video Note   I, Lauraine Kitty, connected with  Jordan [name], 31-Oct-1975) on 05/30/23 at  6:15 PM EST by a video-enabled telemedicine application and verified that I am speaking with the correct person using two identifiers.  Location: Patient: Virtual Visit Location Patient:  Home Provider: Virtual Visit Location Provider: Home Office   I discussed the limitations of evaluation and management by telemedicine and the availability of in person appointments. The patient expressed understanding and agreed to proceed.    History of Present Illness: Jordan [name] is a 48 y.o. who identifies as a male who was assigned male at birth, and is being seen today for medication refill   He has been out of his Norvasc  / Protonix  and Wellbutrin  for three days  Has PCP appointment on the 30th   Has been experiencing nausea and headaches today   Tried to call PCPs office but they have been closed for the Holiday   Problems:  Patient Active Problem List   Diagnosis Date Noted   Not well controlled severe persistent asthma 06/11/2022   Non compliance with medical treatment 06/11/2022   Gastroesophageal reflux disease 06/11/2022   Recurrent infections 06/11/2022   Avascular necrosis of bone of right hip (HCC) 02/20/2022   Avascular necrosis of bone of left hip (HCC) 02/20/2022   Right groin pain 01/25/2022   Low testosterone  in male 09/04/2021   Erectile dysfunction 11/02/2020   Depression, recurrent (HCC) 04/01/2020   Elevated LDL cholesterol level 07/29/2019   SOB (shortness of breath) 07/27/2019   Seasonal and perennial allergic rhinoconjunctivitis 07/20/2019   Asthma 07/20/2019   Heartburn 07/20/2019   Hypertension 07/14/2019    Allergies: No Known Allergies Medications:  Current Outpatient Medications:    albuterol  (PROVENTIL ) (2.5 MG/3ML) 0.083%  nebulizer solution, Take 3 mLs (2.5 mg total) by nebulization every 4 (four) hours as needed for wheezing or shortness of breath (coughing fits)., Disp: 75 mL, Rfl: 1   albuterol  (VENTOLIN  HFA) 108 (90 Base) MCG/ACT inhaler, Inhale 2 puffs into the lungs every 4 (four) hours as needed for wheezing or shortness of breath (coughing fits)., Disp: 18 g, Rfl: 1   amLODipine  (NORVASC ) 10 MG tablet, TAKE 1 TABLET(10 MG) BY  MOUTH DAILY, Disp: 90 tablet, Rfl: 3   BREZTRI  AEROSPHERE 160-9-4.8 MCG/ACT AERO, Inhale 2 puffs into the lungs in the morning and at bedtime. with spacer and rinse mouth afterwards., Disp: 32.1 g, Rfl: 1   buPROPion  (WELLBUTRIN  XL) 150 MG 24 hr tablet, Take 1 tablet (150 mg total) by mouth daily., Disp: 15 tablet, Rfl: 0   cyclobenzaprine  (FLEXERIL ) 10 MG tablet, Take 1 tablet (10 mg total) by mouth 2 (two) times daily as needed for muscle spasms., Disp: 20 tablet, Rfl: 0   EPINEPHrine  0.3 mg/0.3 mL IJ SOAJ injection, INJECT 0.3 MLS INTO MUSCLE, Disp: 1 each, Rfl: 2   escitalopram  (LEXAPRO ) 20 MG tablet, Take 1 tablet (20 mg total) by mouth daily., Disp: 15 tablet, Rfl: 0   fenofibrate  (TRICOR ) 145 MG tablet, TAKE 1 TABLET(145 MG) BY MOUTH DAILY, Disp: 90 tablet, Rfl: 1   fluticasone  (FLONASE ) 50 MCG/ACT nasal spray, Place 2 sprays into both nostrils daily., Disp: 48 g, Rfl: 1   ipratropium (ATROVENT ) 0.06 % nasal spray, Place 2 sprays in each nostril up to three times a day as needed, Disp: 90 mL, Rfl: 1   losartan  (COZAAR ) 25 MG tablet, Take 1 tablet (25 mg total) by mouth daily., Disp: 90 tablet, Rfl: 3   Mepolizumab  (NUCALA ) 100 MG/ML SOAJ, Inject 1 mL (100 mg total) into the skin every 28 (twenty-eight) days., Disp: 1 mL, Rfl: 11   montelukast  (SINGULAIR ) 10 MG tablet, Take 1 tablet (10 mg total) by mouth at bedtime., Disp: 90 tablet, Rfl: 1   ondansetron  (ZOFRAN ) 4 MG tablet, Take 1 tablet (4 mg total) by mouth every 8 (eight) hours as needed for nausea or vomiting., Disp: 40 tablet, Rfl: 0   pantoprazole  (PROTONIX ) 40 MG tablet, TAKE 1/2 TABLET BY MOUTH DAILY, Disp: 45 tablet, Rfl: 1   traMADol  (ULTRAM ) 50 MG tablet, Take 1 tablet (50 mg total) by mouth 2 (two) times daily as needed., Disp: 30 tablet, Rfl: 0   trolamine salicylate (ASPERCREME) 10 % cream, Apply 1 Application topically as needed for muscle pain., Disp: , Rfl:   Current Facility-Administered Medications:    Mepolizumab  SOLR  100 mg, 100 mg, Subcutaneous, Q28 days, Jeneal Danita Macintosh, MD, 100 mg at 01/15/23 1826  Observations/Objective: Patient is well-developed, well-nourished in no acute distress.  Resting comfortably  at home.  Head is normocephalic, atraumatic.  No labored breathing.  Speech is clear and coherent with logical content.  Patient is alert and oriented at baseline.    Assessment and Plan:   Patient is aware this is one time refill, he must keep appointment at the end of the month or be seen in UC for follow up prior to future refills   1. Depression, recurrent (HCC)  - buPROPion  (WELLBUTRIN  XL) 150 MG 24 hr tablet; Take 1 tablet (150 mg total) by mouth daily.  Dispense: 30 tablet; Refill: 0  2. Primary hypertension  - amLODipine  (NORVASC ) 10 MG tablet; Take 1 tablet (10 mg total) by mouth daily.  Dispense: 30 tablet; Refill: 0  3. Gastroesophageal reflux disease without esophagitis (Primary)  - pantoprazole  (PROTONIX ) 40 MG tablet; TAKE 1/2 TABLET BY MOUTH DAILY  Dispense: 30 tablet; Refill: 0     Follow Up Instructions: I discussed the assessment and treatment plan with the patient. The patient was provided an opportunity to ask questions and all were answered. The patient agreed with the plan and demonstrated an understanding of the instructions.  A copy of instructions were sent to the patient via MyChart unless otherwise noted below.    The patient was advised to call back or seek an in-person evaluation if the symptoms worsen or if the condition fails to improve as anticipated.    Lauraine Kitty, FNP

## 2023-06-01 ENCOUNTER — Telehealth: Payer: Medicare HMO | Admitting: Nurse Practitioner

## 2023-06-01 DIAGNOSIS — K219 Gastro-esophageal reflux disease without esophagitis: Secondary | ICD-10-CM | POA: Diagnosis not present

## 2023-06-01 MED ORDER — FAMOTIDINE 20 MG PO TABS
20.0000 mg | ORAL_TABLET | Freq: Two times a day (BID) | ORAL | 0 refills | Status: DC
Start: 2023-06-01 — End: 2023-06-03

## 2023-06-01 NOTE — Progress Notes (Signed)
 Virtual Visit Consent   Jordan [name], you are scheduled for a virtual visit with a [facility] provider today. Just as with appointments in the office, your consent must be obtained to participate. Your consent will be active for this visit and any virtual visit you may have with one of our providers in the next 365 days. If you have a MyChart account, a copy of this consent can be sent to you electronically.  As this is a virtual visit, video technology does not allow for your provider to perform a traditional examination. This may limit your provider's ability to fully assess your condition. If your provider identifies any concerns that need to be evaluated in person or the need to arrange testing (such as labs, EKG, etc.), we will make arrangements to do so. Although advances in technology are sophisticated, we cannot ensure that it will always work on either your end or our end. If the connection with a video visit is poor, the visit may have to be switched to a telephone visit. With either a video or telephone visit, we are not always able to ensure that we have a secure connection.  By engaging in this virtual visit, you consent to the provision of healthcare and authorize for your insurance to be billed (if applicable) for the services provided during this visit. Depending on your insurance coverage, you may receive a charge related to this service.  I need to obtain your verbal consent now. Are you willing to proceed with your visit today? Jordan [name] has provided verbal consent on 06/01/2023 for a virtual visit (video or telephone). Jordan LELON Servant, NP  Date: 06/01/2023 7:54 PM  Virtual Visit via Video Note   I, Jordan [name], connected with  Jordan [name], January 01, 1976) on 06/01/23 at  7:45 PM EST by a video-enabled telemedicine application and verified that I am speaking with the correct person using two identifiers.  Location: Patient: Virtual Visit Location Patient:  Home Provider: Virtual Visit Location Provider: Home Office   I discussed the limitations of evaluation and management by telemedicine and the availability of in person appointments. The patient expressed understanding and agreed to proceed.    History of Present Illness: Jordan [name] is a 48 y.o. who identifies as a male who was assigned male at birth, and is being seen today for GERD.  Mr. Gregson was recently restarted on buproprion and now experiencing GERD symptoms. Insurance will not cover pantoprazole . Associated symptoms include nausea. He was instructed previously to establish care with PCP for all his medication refills. He did ask for celexa today which I have declined to restart as he needs to be seen in office   Problems:  Patient Active Problem List   Diagnosis Date Noted   Not well controlled severe persistent asthma 06/11/2022   Non compliance with medical treatment 06/11/2022   Gastroesophageal reflux disease 06/11/2022   Recurrent infections 06/11/2022   Avascular necrosis of bone of right hip (HCC) 02/20/2022   Avascular necrosis of bone of left hip (HCC) 02/20/2022   Right groin pain 01/25/2022   Low testosterone  in male 09/04/2021   Erectile dysfunction 11/02/2020   Depression, recurrent (HCC) 04/01/2020   Elevated LDL cholesterol level 07/29/2019   SOB (shortness of breath) 07/27/2019   Seasonal and perennial allergic rhinoconjunctivitis 07/20/2019   Asthma 07/20/2019   Heartburn 07/20/2019   Hypertension 07/14/2019    Allergies: No Known Allergies Medications:  Current  Outpatient Medications:    famotidine  (PEPCID ) 20 MG tablet, Take 1 tablet (20 mg total) by mouth 2 (two) times daily. May take an addition to omeprazole , Disp: 60 tablet, Rfl: 0   albuterol  (PROVENTIL ) (2.5 MG/3ML) 0.083% nebulizer solution, Take 3 mLs (2.5 mg total) by nebulization every 4 (four) hours as needed for wheezing or shortness of breath (coughing fits)., Disp: 75 mL, Rfl: 1    albuterol  (VENTOLIN  HFA) 108 (90 Base) MCG/ACT inhaler, Inhale 2 puffs into the lungs every 4 (four) hours as needed for wheezing or shortness of breath (coughing fits)., Disp: 18 g, Rfl: 1   amLODipine  (NORVASC ) 10 MG tablet, Take 1 tablet (10 mg total) by mouth daily., Disp: 30 tablet, Rfl: 0   BREZTRI  AEROSPHERE 160-9-4.8 MCG/ACT AERO, Inhale 2 puffs into the lungs in the morning and at bedtime. with spacer and rinse mouth afterwards., Disp: 32.1 g, Rfl: 1   buPROPion  (WELLBUTRIN  XL) 150 MG 24 hr tablet, Take 1 tablet (150 mg total) by mouth daily., Disp: 30 tablet, Rfl: 0   cyclobenzaprine  (FLEXERIL ) 10 MG tablet, Take 1 tablet (10 mg total) by mouth 2 (two) times daily as needed for muscle spasms., Disp: 20 tablet, Rfl: 0   EPINEPHrine  0.3 mg/0.3 mL IJ SOAJ injection, INJECT 0.3 MLS INTO MUSCLE, Disp: 1 each, Rfl: 2   escitalopram  (LEXAPRO ) 20 MG tablet, Take 1 tablet (20 mg total) by mouth daily., Disp: 15 tablet, Rfl: 0   fenofibrate  (TRICOR ) 145 MG tablet, TAKE 1 TABLET(145 MG) BY MOUTH DAILY, Disp: 90 tablet, Rfl: 1   fluticasone  (FLONASE ) 50 MCG/ACT nasal spray, Place 2 sprays into both nostrils daily., Disp: 48 g, Rfl: 1   ipratropium (ATROVENT ) 0.06 % nasal spray, Place 2 sprays in each nostril up to three times a day as needed, Disp: 90 mL, Rfl: 1   losartan  (COZAAR ) 25 MG tablet, Take 1 tablet (25 mg total) by mouth daily., Disp: 90 tablet, Rfl: 3   Mepolizumab  (NUCALA ) 100 MG/ML SOAJ, Inject 1 mL (100 mg total) into the skin every 28 (twenty-eight) days., Disp: 1 mL, Rfl: 11   montelukast  (SINGULAIR ) 10 MG tablet, Take 1 tablet (10 mg total) by mouth at bedtime., Disp: 90 tablet, Rfl: 1   ondansetron  (ZOFRAN ) 4 MG tablet, Take 1 tablet (4 mg total) by mouth every 8 (eight) hours as needed for nausea or vomiting., Disp: 40 tablet, Rfl: 0   pantoprazole  (PROTONIX ) 40 MG tablet, TAKE 1/2 TABLET BY MOUTH DAILY, Disp: 30 tablet, Rfl: 0   traMADol  (ULTRAM ) 50 MG tablet, Take 1 tablet (50 mg  total) by mouth 2 (two) times daily as needed., Disp: 30 tablet, Rfl: 0   trolamine salicylate (ASPERCREME) 10 % cream, Apply 1 Application topically as needed for muscle pain., Disp: , Rfl:   Current Facility-Administered Medications:    Mepolizumab  SOLR 100 mg, 100 mg, Subcutaneous, Q28 days, Jeneal Danita Macintosh, MD, 100 mg at 01/15/23 1826  Observations/Objective: Patient is well-developed, well-nourished in no acute distress.  Resting comfortably at home.  Head is normocephalic, atraumatic.  No labored breathing.  Speech is clear and coherent with logical content.  Patient is alert and oriented at baseline.    Assessment and Plan: 1. GERD without esophagitis (Primary) - famotidine  (PEPCID ) 20 MG tablet; Take 1 tablet (20 mg total) by mouth 2 (two) times daily. May take an addition to omeprazole   Dispense: 60 tablet; Refill: 0  INSTRUCTIONS: Avoid GERD Triggers: acidic, spicy or fried foods, caffeine, coffee, sodas,  alcohol and chocolate.    Follow Up Instructions: I discussed the assessment and treatment plan with the patient. The patient was provided an opportunity to ask questions and all were answered. The patient agreed with the plan and demonstrated an understanding of the instructions.  A copy of instructions were sent to the patient via MyChart unless otherwise noted below.    The patient was advised to call back or seek an in-person evaluation if the symptoms worsen or if the condition fails to improve as anticipated.    Jobin Montelongo W Bryker Fletchall, NP

## 2023-06-01 NOTE — Patient Instructions (Signed)
 [name], thank you for joining [name], NP for today's virtual visit.  While this provider is not your primary care provider (PCP), if your PCP is located in our provider database this encounter information will be shared with them immediately following your visit.   A [facility] MyChart account gives you access to today's visit and all your visits, tests, and labs performed at [facility]  click here if you don't have a [facility] MyChart account or go to [email or web address]  Consent: (Patient) Jordan [name] provided verbal consent for this virtual visit at the beginning of the encounter.  Current Medications:  Current Outpatient Medications:    famotidine  (PEPCID ) 20 MG tablet, Take 1 tablet (20 mg total) by mouth 2 (two) times daily. May take an addition to omeprazole , Disp: 60 tablet, Rfl: 0   albuterol  (PROVENTIL ) (2.5 MG/3ML) 0.083% nebulizer solution, Take 3 mLs (2.5 mg total) by nebulization every 4 (four) hours as needed for wheezing or shortness of breath (coughing fits)., Disp: 75 mL, Rfl: 1   albuterol  (VENTOLIN  HFA) 108 (90 Base) MCG/ACT inhaler, Inhale 2 puffs into the lungs every 4 (four) hours as needed for wheezing or shortness of breath (coughing fits)., Disp: 18 g, Rfl: 1   amLODipine  (NORVASC ) 10 MG tablet, Take 1 tablet (10 mg total) by mouth daily., Disp: 30 tablet, Rfl: 0   BREZTRI  AEROSPHERE 160-9-4.8 MCG/ACT AERO, Inhale 2 puffs into the lungs in the morning and at bedtime. with spacer and rinse mouth afterwards., Disp: 32.1 g, Rfl: 1   buPROPion  (WELLBUTRIN  XL) 150 MG 24 hr tablet, Take 1 tablet (150 mg total) by mouth daily., Disp: 30 tablet, Rfl: 0   cyclobenzaprine  (FLEXERIL ) 10 MG tablet, Take 1 tablet (10 mg total) by mouth 2 (two) times daily as needed for muscle spasms., Disp: 20 tablet, Rfl: 0   EPINEPHrine  0.3 mg/0.3 mL IJ SOAJ injection, INJECT 0.3 MLS INTO MUSCLE, Disp: 1 each, Rfl: 2   escitalopram  (LEXAPRO ) 20 MG  tablet, Take 1 tablet (20 mg total) by mouth daily., Disp: 15 tablet, Rfl: 0   fenofibrate  (TRICOR ) 145 MG tablet, TAKE 1 TABLET(145 MG) BY MOUTH DAILY, Disp: 90 tablet, Rfl: 1   fluticasone  (FLONASE ) 50 MCG/ACT nasal spray, Place 2 sprays into both nostrils daily., Disp: 48 g, Rfl: 1   ipratropium (ATROVENT ) 0.06 % nasal spray, Place 2 sprays in each nostril up to three times a day as needed, Disp: 90 mL, Rfl: 1   losartan  (COZAAR ) 25 MG tablet, Take 1 tablet (25 mg total) by mouth daily., Disp: 90 tablet, Rfl: 3   Mepolizumab  (NUCALA ) 100 MG/ML SOAJ, Inject 1 mL (100 mg total) into the skin every 28 (twenty-eight) days., Disp: 1 mL, Rfl: 11   montelukast  (SINGULAIR ) 10 MG tablet, Take 1 tablet (10 mg total) by mouth at bedtime., Disp: 90 tablet, Rfl: 1   ondansetron  (ZOFRAN ) 4 MG tablet, Take 1 tablet (4 mg total) by mouth every 8 (eight) hours as needed for nausea or vomiting., Disp: 40 tablet, Rfl: 0   pantoprazole  (PROTONIX ) 40 MG tablet, TAKE 1/2 TABLET BY MOUTH DAILY, Disp: 30 tablet, Rfl: 0   traMADol  (ULTRAM ) 50 MG tablet, Take 1 tablet (50 mg total) by mouth 2 (two) times daily as needed., Disp: 30 tablet, Rfl: 0   trolamine salicylate (ASPERCREME) 10 % cream, Apply 1 Application topically as needed for muscle pain., Disp: , Rfl:   Current Facility-Administered Medications:    Mepolizumab  SOLR  100 mg, 100 mg, Subcutaneous, Q28 days, Jeneal Danita Macintosh, MD, 100 mg at 01/15/23 1826   Medications ordered in this encounter:  Meds ordered this encounter  Medications   famotidine  (PEPCID ) 20 MG tablet    Sig: Take 1 tablet (20 mg total) by mouth 2 (two) times daily. May take an addition to omeprazole     Dispense:  60 tablet    Refill:  0    Supervising Provider:   LAMPTEY, PHILIP O [[id number]]     *If you need refills on other medications prior to your next appointment, please contact your pharmacy*  Follow-Up: Call back or seek an in-person evaluation if the symptoms worsen or  if the condition fails to improve as anticipated.  [facility] [phone]  Other Instructions INSTRUCTIONS: Avoid GERD Triggers: acidic, spicy or fried foods, caffeine, coffee, sodas,  alcohol and chocolate.     If you have been instructed to have an in-person evaluation today at a local Urgent Care facility, please use the link below. It will take you to a list of all of our available [facility], including address, phone number and hours of operation. Please do not delay care.  [facility]  If you or a family member do not have a primary care provider, use the link below to schedule a visit and establish care. When you choose a [facility] primary care physician or advanced practice provider, you gain a long-term partner in health. Find a Primary Care Provider  Learn more about [facility] in-office and virtual care options: [facility] - Get Care Now

## 2023-06-03 ENCOUNTER — Other Ambulatory Visit: Payer: Self-pay | Admitting: Nurse Practitioner

## 2023-06-03 DIAGNOSIS — K219 Gastro-esophageal reflux disease without esophagitis: Secondary | ICD-10-CM

## 2023-06-06 ENCOUNTER — Ambulatory Visit: Payer: Medicare HMO | Admitting: Family Medicine

## 2023-06-06 NOTE — Progress Notes (Deleted)
   [address] [place] [address] Dept: [phone]  FOLLOW UP NOTE  Patient ID: Jordan [name], male    DOB: 10-14-1975  Age: 48 y.o. MRN: [id number] Date of Office Visit: 06/06/2023  Assessment  Chief Complaint: No chief complaint on file.  HPI Jordan [name] is a 48 year old male who presents to the clinic for follow-up visit.  He was last seen in this clinic on 08/03/2022 by Dr. Jeneal for evaluation of asthma, allergic rhinitis, reflux, and recurrent infection.  His last environmental allergy skin testing was on 06/10/2019 and was positive to grass pollen, tree pollen, mold, and dust mite.  Discussed the use of AI scribe software for clinical note transcription with the patient, who gave verbal consent to proceed.  History of Present Illness             Drug Allergies:  No Known Allergies  Physical Exam: There were no vitals taken for this visit.   Physical Exam  Diagnostics:    Assessment and Plan: No diagnosis found.  No orders of the defined types were placed in this encounter.   There are no Patient Instructions on file for this visit.  No follow-ups on file.    Thank you for the opportunity to care for this patient.  Please do not hesitate to contact me with questions.  Arlean Mutter, FNP [facility]

## 2023-06-10 ENCOUNTER — Encounter: Payer: Self-pay | Admitting: Sports Medicine

## 2023-06-13 ENCOUNTER — Other Ambulatory Visit: Payer: Self-pay

## 2023-06-13 ENCOUNTER — Ambulatory Visit: Payer: Medicare HMO | Admitting: Family Medicine

## 2023-06-13 ENCOUNTER — Encounter: Payer: Self-pay | Admitting: Family Medicine

## 2023-06-13 VITALS — BP 122/70 | HR 90 | Temp 98.6°F | Resp 16 | Wt 170.8 lb

## 2023-06-13 DIAGNOSIS — K219 Gastro-esophageal reflux disease without esophagitis: Secondary | ICD-10-CM

## 2023-06-13 DIAGNOSIS — J4550 Severe persistent asthma, uncomplicated: Secondary | ICD-10-CM

## 2023-06-13 DIAGNOSIS — H1013 Acute atopic conjunctivitis, bilateral: Secondary | ICD-10-CM

## 2023-06-13 DIAGNOSIS — H1010 Acute atopic conjunctivitis, unspecified eye: Secondary | ICD-10-CM

## 2023-06-13 DIAGNOSIS — Z91199 Patient's noncompliance with other medical treatment and regimen due to unspecified reason: Secondary | ICD-10-CM

## 2023-06-13 DIAGNOSIS — J3089 Other allergic rhinitis: Secondary | ICD-10-CM

## 2023-06-13 DIAGNOSIS — J302 Other seasonal allergic rhinitis: Secondary | ICD-10-CM

## 2023-06-13 MED ORDER — PANTOPRAZOLE SODIUM 40 MG PO TBEC: DELAYED_RELEASE_TABLET | ORAL | 1 refills | Status: DC

## 2023-06-13 MED ORDER — ALBUTEROL SULFATE (2.5 MG/3ML) 0.083% IN NEBU: 2.5000 mg | INHALATION_SOLUTION | RESPIRATORY_TRACT | 1 refills | Status: AC | PRN

## 2023-06-13 MED ORDER — MONTELUKAST SODIUM 10 MG PO TABS: 10.0000 mg | ORAL_TABLET | Freq: Every day | ORAL | 1 refills | Status: DC

## 2023-06-13 MED ORDER — ALBUTEROL SULFATE HFA 108 (90 BASE) MCG/ACT IN AERS: 2.0000 | INHALATION_SPRAY | RESPIRATORY_TRACT | 1 refills | Status: DC | PRN

## 2023-06-13 MED ORDER — FLUTICASONE PROPIONATE 50 MCG/ACT NA SUSP: 2.0000 | Freq: Every day | NASAL | 1 refills | Status: DC

## 2023-06-13 MED ORDER — BREZTRI AEROSPHERE 160-9-4.8 MCG/ACT IN AERO: 2.0000 | INHALATION_SPRAY | Freq: Two times a day (BID) | RESPIRATORY_TRACT | 1 refills | Status: DC

## 2023-06-13 NOTE — Progress Notes (Signed)
[address] [place] [address] Dept: [phone]  FOLLOW UP NOTE  Patient ID: Jordan [name], male    DOB: [birth year]  Age: 48 y.o. MRN: [id number] Date of Office Visit: 06/13/2023  Assessment  Chief Complaint: Follow-up (Nasal drainage and coughing( long periods of time). ) and Medication Refill  HPI Jordan [name] is a 48 year old male who presents to the clinic for follow-up visit.  He was last seen in this clinic on 08/03/2022 by Dr. Delorse Lek for evaluation of asthma, allergic rhinitis, reflux, and recurrent infection.    At today's visit, he reports his asthma has been poorly controlled with symptoms including shortness of breath and dry cough that started about 1 month ago.  He reports that over the last several weeks he has been using breast tree 4 to 5 puffs a day and has been out of albuterol for the last several months.  He has previously received Nucala injections with no large or local reactions.  His last injection was on 01/15/2023.  He reports significant decrease in his symptoms of asthma while continuing on Nucala injections.    Allergic rhinitis is reported as poorly controlled with symptoms including nasal congestion, sneezing, and postnasal drainage.  He reports his nostrils frequently feel dry.  He has previously taken montelukast, however, reports that he is out of this medication.  He is using nasal steroid spray that he purchased over-the-counter.  He is not currently using an antihistamine or nasal saline rinses.  His last environmental allergy skin testing was on 06/10/2019 and was positive to grass pollen, tree pollen, mold, and dust mite.  Allergic conjunctivitis is reported as moderately well controlled with occasional red and itchy eyes for which he continues olopatadine with relief of symptoms.   Reflux is reported as moderately well controlled with heartburn occurring almost daily. He denies vomiting. He reports his reflux had been well controlled while  taking pantoprazole, however, he is currently out of this medication.   His current medications are listed in the chart.  Drug Allergies:  No Known Allergies  Physical Exam: BP 122/70   Pulse 90   Temp 98.6 F (37 C) (Temporal)   Resp 16   Wt 170 lb 12.8 oz (77.5 kg)   SpO2 99%   BMI 25.97 kg/m    Physical Exam Vitals reviewed.  Constitutional:      Appearance: Normal appearance.  HENT:     Head: Normocephalic and atraumatic.     Right Ear: Tympanic membrane normal.     Left Ear: Tympanic membrane normal.     Nose:     Comments: Bilateral nares slightly erythematous with clear nasal drainage noted. Pharynx normal. Ears normal. Eyes normal.    Mouth/Throat:     Pharynx: Oropharynx is clear.  Eyes:     Conjunctiva/sclera: Conjunctivae normal.  Cardiovascular:     Rate and Rhythm: Normal rate and regular rhythm.     Heart sounds: Normal heart sounds. No murmur heard. Pulmonary:     Effort: Pulmonary effort is normal.     Breath sounds: Normal breath sounds.     Comments: Lungs clear to auscultation Musculoskeletal:        General: Normal range of motion.     Cervical back: Normal range of motion and neck supple.  Skin:    General: Skin is warm and dry.  Neurological:     Mental Status: He is alert and oriented to person, place, and time.  Psychiatric:  Mood and Affect: Mood normal.        Behavior: Behavior normal.        Thought Content: Thought content normal.        Judgment: Judgment normal.     Diagnostics: FVC 2.90 which is 70% of predicted value, FEV1 2.46 which is 74% of predicted value.  Spirometry indicates possible restriction.  Postbronchodilator therapy indicates no improvement in FEV1  Assessment and Plan: 1. Not well controlled severe persistent asthma   2. Seasonal and perennial allergic rhinitis   3. Seasonal allergic conjunctivitis   4. Gastroesophageal reflux disease, unspecified whether esophagitis present   5. Non compliance with  medical treatment   6. Gastroesophageal reflux disease without esophagitis     Meds ordered this encounter  Medications   albuterol (PROVENTIL) (2.5 MG/3ML) 0.083% nebulizer solution    Sig: Take 3 mLs (2.5 mg total) by nebulization every 4 (four) hours as needed for wheezing or shortness of breath (coughing fits).    Dispense:  75 mL    Refill:  1   albuterol (VENTOLIN HFA) 108 (90 Base) MCG/ACT inhaler    Sig: Inhale 2 puffs into the lungs every 4 (four) hours as needed for wheezing or shortness of breath (coughing fits).    Dispense:  18 g    Refill:  1   BREZTRI AEROSPHERE 160-9-4.8 MCG/ACT AERO    Sig: Inhale 2 puffs into the lungs in the morning and at bedtime. with spacer and rinse mouth afterwards.    Dispense:  32.1 g    Refill:  1   fluticasone (FLONASE) 50 MCG/ACT nasal spray    Sig: Place 2 sprays into both nostrils daily.    Dispense:  48 g    Refill:  1   montelukast (SINGULAIR) 10 MG tablet    Sig: Take 1 tablet (10 mg total) by mouth at bedtime.    Dispense:  90 tablet    Refill:  1   pantoprazole (PROTONIX) 40 MG tablet    Sig: TAKE 1/2 TABLET BY MOUTH DAILY    Dispense:  45 tablet    Refill:  1    **Patient requests 90 days supply**    Patient Instructions  Asthma Continue montelukast 10 mg once a day to prevent cough or wheeze Continue Breztri 2 puffs twice a day with a spacer to prevent cough or wheeze Continue albuterol 2 puffs every 4 hours as needed for cough or wheeze OR Instead use albuterol 0.083% solution via nebulizer one unit vial every 4 hours as needed for cough or wheeze Restart Nucala injections once a month to control asthma symptoms  Allergic rhinitis Continue allergen avoidance measures directed toward grass pollen, tree pollen, mold, and dust mites as listed below Restart cetirizine 10 mg once a day if needed for runny nose or itch Continue montelukast 10 mg once a day as listed above to control allergy symptoms Restart Nasacort 2  sprays in each nostril once a day if needed for stuffy nose Consider saline nasal rinses as needed for nasal symptoms. Use this before any medicated nasal sprays for best result Consider allergen immunotherapy if your symptoms are not well-controlled with the treatment plan as listed above call  Allergic conjunctivitis Some over the counter eye drops include Pataday one drop in each eye once a day as needed for red, itchy eyes OR Zaditor one drop in each eye twice a day as needed for red itchy eyes. Avoid eye drops that say red eye relief  as they may contain medications that dry out your eyes.   Reflux Continue dietary and lifestyle modifications as listed below Restart pantoprazole 20 mg once a day.  Take this medication 30 to 60 minutes before your first meal  Call the clinic if this treatment plan is not working well for you.  Follow up in 1 month or sooner if needed.   Return in about 4 weeks (around 07/11/2023), or if symptoms worsen or fail to improve.    Thank you for the opportunity to care for this patient.  Please do not hesitate to contact me with questions.  Thermon Leyland, FNP [facility]

## 2023-06-13 NOTE — Patient Instructions (Signed)
Asthma Continue montelukast 10 mg once a day to prevent cough or wheeze Continue Breztri 2 puffs twice a day with a spacer to prevent cough or wheeze Continue albuterol 2 puffs every 4 hours as needed for cough or wheeze OR Instead use albuterol 0.083% solution via nebulizer one unit vial every 4 hours as needed for cough or wheeze Restart Nucala injections once a month to control asthma symptoms  Allergic rhinitis Continue allergen avoidance measures directed toward grass pollen, tree pollen, mold, and dust mites as listed below Restart cetirizine 10 mg once a day if needed for runny nose or itch Continue montelukast 10 mg once a day as listed above to control allergy symptoms Restart Nasacort 2 sprays in each nostril once a day if needed for stuffy nose Consider saline nasal rinses as needed for nasal symptoms. Use this before any medicated nasal sprays for best result Consider allergen immunotherapy if your symptoms are not well-controlled with the treatment plan as listed above call  Allergic conjunctivitis Some over the counter eye drops include Pataday one drop in each eye once a day as needed for red, itchy eyes OR Zaditor one drop in each eye twice a day as needed for red itchy eyes. Avoid eye drops that say red eye relief as they may contain medications that dry out your eyes.   Reflux Continue dietary and lifestyle modifications as listed below Restart pantoprazole 20 mg once a day.  Take this medication 30 to 60 minutes before your first meal  Call the clinic if this treatment plan is not working well for you.  Follow up in 1 month or sooner if needed.   Lifestyle Changes for Controlling GERD When you have GERD, stomach acid feels as if it's backing up toward your mouth. Whether or not you take medication to control your GERD, your symptoms can often be improved with lifestyle changes.   Raise Your Head Reflux is more likely to strike when you're lying down flat, because  stomach fluid can flow backward more easily. Raising the head of your bed 4-6 inches can help. To do this: Slide blocks or books under the legs at the head of your bed. Or, place a wedge under the mattress. Many foam stores can make a suitable wedge for you. The wedge should run from your waist to the top of your head. Don't just prop your head on several pillows. This increases pressure on your stomach. It can make GERD worse.  Watch Your Eating Habits Certain foods may increase the acid in your stomach or relax the lower esophageal sphincter, making GERD more likely. It's best to avoid the following: Coffee, tea, and carbonated drinks (with and without caffeine) Fatty, fried, or spicy food Mint, chocolate, onions, and tomatoes Any other foods that seem to irritate your stomach or cause you pain  Relieve the Pressure Eat smaller meals, even if you have to eat more often. Don't lie down right after you eat. Wait a few hours for your stomach to empty. Avoid tight belts and tight-fitting clothes. Lose excess weight.  Tobacco and Alcohol Avoid smoking tobacco and drinking alcohol. They can make GERD symptoms worse.  Reducing Pollen Exposure The American Academy of Allergy, Asthma and Immunology suggests the following steps to reduce your exposure to pollen during allergy seasons. Do not hang sheets or clothing out to dry; pollen may collect on these items. Do not mow lawns or spend time around freshly cut grass; mowing stirs up pollen. Keep windows closed  at night.  Keep car windows closed while driving. Minimize morning activities outdoors, a time when pollen counts are usually at their highest. Stay indoors as much as possible when pollen counts or humidity is high and on windy days when pollen tends to remain in the air longer. Use air conditioning when possible.  Many air conditioners have filters that trap the pollen spores. Use a HEPA room air filter to remove pollen form the  indoor air you breathe.  Control of Mold Allergen Mold and fungi can grow on a variety of surfaces provided certain temperature and moisture conditions exist.  Outdoor molds grow on plants, decaying vegetation and soil.  The major outdoor mold, Alternaria and Cladosporium, are found in very high numbers during hot and dry conditions.  Generally, a late Summer - Fall peak is seen for common outdoor fungal spores.  Rain will temporarily lower outdoor mold spore count, but counts rise rapidly when the rainy period ends.  The most important indoor molds are Aspergillus and Penicillium.  Dark, humid and poorly ventilated basements are ideal sites for mold growth.  The next most common sites of mold growth are the bathroom and the kitchen.  Outdoor [employer] Use air conditioning and keep windows closed Avoid exposure to decaying vegetation. Avoid leaf raking. Avoid grain handling. Consider wearing a face mask if working in moldy areas.  Indoor Mold Control Maintain humidity below 50%. Clean washable surfaces with 5% bleach solution. Remove sources e.g. Contaminated carpets.   Control of Dust Mite Allergen Dust mites play a major role in allergic asthma and rhinitis. They occur in environments with high humidity wherever human skin is found. Dust mites absorb humidity from the atmosphere (ie, they do not drink) and feed on organic matter (including shed human and animal skin). Dust mites are a microscopic type of insect that you cannot see with the naked eye. High levels of dust mites have been detected from mattresses, pillows, carpets, upholstered furniture, bed covers, clothes, soft toys and any woven material. The principal allergen of the dust mite is found in its feces. A gram of dust may contain 1,000 mites and 250,000 fecal particles. Mite antigen is easily measured in the air during house cleaning activities. Dust mites do not bite and do not cause harm to humans, other than by triggering  allergies/asthma.  Ways to decrease your exposure to dust mites in your home:  1. Encase mattresses, box springs and pillows with a mite-impermeable barrier or cover  2. Wash sheets, blankets and drapes weekly in hot water (130 F) with detergent and dry them in a dryer on the hot setting.  3. Have the room cleaned frequently with a vacuum cleaner and a damp dust-mop. For carpeting or rugs, vacuuming with a vacuum cleaner equipped with a high-efficiency particulate air (HEPA) filter. The dust mite allergic individual should not be in a room which is being cleaned and should wait 1 hour after cleaning before going into the room.  4. Do not sleep on upholstered furniture (eg, couches).  5. If possible removing carpeting, upholstered furniture and drapery from the home is ideal. Horizontal blinds should be eliminated in the rooms where the person spends the most time (bedroom, study, television room). Washable vinyl, roller-type shades are optimal.  6. Remove all non-washable stuffed toys from the bedroom. Wash stuffed toys weekly like sheets and blankets above.  7. Reduce indoor humidity to less than 50%. Inexpensive humidity monitors can be purchased at most hardware stores. Do  not use a humidifier as can make the problem worse and are not recommended.

## 2023-06-14 ENCOUNTER — Other Ambulatory Visit (HOSPITAL_COMMUNITY): Payer: Self-pay

## 2023-06-14 ENCOUNTER — Telehealth: Payer: Self-pay

## 2023-06-14 MED ORDER — PANTOPRAZOLE SODIUM 20 MG PO TBEC: 20.0000 mg | DELAYED_RELEASE_TABLET | Freq: Every day | ORAL | 3 refills | Status: DC

## 2023-06-14 NOTE — Telephone Encounter (Signed)
Pharmacy Patient Advocate Encounter   Received notification from Fax that prior authorization for Jordan [name] is required/requested.   Insurance verification completed.   The patient is insured through [employer] .   Per test claim: The current 30 day co-pay is, $0.00.  No PA needed at this time. This test claim was processed through [facility]- copay amounts may vary at other pharmacies due to pharmacy/plan contracts, or as the patient moves through the different stages of their insurance plan.

## 2023-06-14 NOTE — Telephone Encounter (Signed)
Received fax from [facility]/[address] - DOB verified - stated the following:  Pantoprazole 40 mg  - can not be split  - new prescription needs to be sent in.  Pantoprazole 20 mg instead of 40 mg.

## 2023-06-24 ENCOUNTER — Ambulatory Visit: Payer: Medicare HMO | Admitting: Sports Medicine

## 2023-06-27 ENCOUNTER — Other Ambulatory Visit: Payer: Self-pay | Admitting: Allergy

## 2023-06-27 ENCOUNTER — Ambulatory Visit (INDEPENDENT_AMBULATORY_CARE_PROVIDER_SITE_OTHER): Payer: Medicare HMO | Admitting: Primary Care

## 2023-07-01 ENCOUNTER — Telehealth: Payer: Medicare HMO | Admitting: Family Medicine

## 2023-07-01 DIAGNOSIS — F339 Major depressive disorder, recurrent, unspecified: Secondary | ICD-10-CM | POA: Diagnosis not present

## 2023-07-01 DIAGNOSIS — I10 Essential (primary) hypertension: Secondary | ICD-10-CM

## 2023-07-01 MED ORDER — AMLODIPINE BESYLATE 10 MG PO TABS: 10.0000 mg | ORAL_TABLET | Freq: Every day | ORAL | 0 refills | Status: DC

## 2023-07-01 MED ORDER — ESCITALOPRAM OXALATE 20 MG PO TABS: 20.0000 mg | ORAL_TABLET | Freq: Every day | ORAL | 0 refills | Status: DC

## 2023-07-01 MED ORDER — BUPROPION HCL ER (XL) 150 MG PO TB24: 150.0000 mg | ORAL_TABLET | Freq: Every day | ORAL | 0 refills | Status: DC

## 2023-07-01 NOTE — Progress Notes (Signed)
Virtual Visit Consent   Jordan [name], you are scheduled for a virtual visit with a [facility] provider today. Just as with appointments in the office, your consent must be obtained to participate. Your consent will be active for this visit and any virtual visit you may have with one of our providers in the next 365 days. If you have a MyChart account, a copy of this consent can be sent to you electronically.  As this is a virtual visit, video technology does not allow for your provider to perform a traditional examination. This may limit your provider's ability to fully assess your condition. If your provider identifies any concerns that need to be evaluated in person or the need to arrange testing (such as labs, EKG, etc.), we will make arrangements to do so. Although advances in technology are sophisticated, we cannot ensure that it will always work on either your end or our end. If the connection with a video visit is poor, the visit may have to be switched to a telephone visit. With either a video or telephone visit, we are not always able to ensure that we have a secure connection.  By engaging in this virtual visit, you consent to the provision of healthcare and authorize for your insurance to be billed (if applicable) for the services provided during this visit. Depending on your insurance coverage, you may receive a charge related to this service.  I need to obtain your verbal consent now. Are you willing to proceed with your visit today? Jordan [name] has provided verbal consent on 07/01/2023 for a virtual visit (video or telephone). Georgana Curio, FNP  Date: 07/01/2023 4:42 PM  Virtual Visit via Video Note   I, Georgana Curio, connected with  Jordan [name]  ([id number], 1975-09-17) on 07/01/23 at  4:30 PM EST by a video-enabled telemedicine application and verified that I am speaking with the correct person using two identifiers.  Location: Patient: Virtual Visit Location Patient:  Home Provider: Virtual Visit Location Provider: Home Office   I discussed the limitations of evaluation and management by telemedicine and the availability of in person appointments. The patient expressed understanding and agreed to proceed.    History of Present Illness: Jordan [name] is a 48 y.o. who identifies as a male who was assigned male at birth, and is being seen today for refills on bp and anxiety meds. He has not been able to get in with his pcp. We will refill for 30 days pending apptmt. Marland Kitchen  HPI: HPI  Problems:  Patient Active Problem List   Diagnosis Date Noted   Not well controlled severe persistent asthma 06/11/2022   Non compliance with medical treatment 06/11/2022   Gastroesophageal reflux disease 06/11/2022   Recurrent infections 06/11/2022   Avascular necrosis of bone of right hip (HCC) 02/20/2022   Avascular necrosis of bone of left hip (HCC) 02/20/2022   Right groin pain 01/25/2022   Low testosterone in male 09/04/2021   Erectile dysfunction 11/02/2020   Depression, recurrent (HCC) 04/01/2020   Elevated LDL cholesterol level 07/29/2019   SOB (shortness of breath) 07/27/2019   Seasonal allergic conjunctivitis 07/20/2019   Asthma 07/20/2019   Heartburn 07/20/2019   Hypertension 07/14/2019    Allergies: No Known Allergies Medications:  Current Outpatient Medications:    amLODipine (NORVASC) 10 MG tablet, Take 1 tablet (10 mg total) by mouth daily., Disp: 30 tablet, Rfl: 0   albuterol (PROVENTIL) (2.5 MG/3ML) 0.083% nebulizer solution,  Take 3 mLs (2.5 mg total) by nebulization every 4 (four) hours as needed for wheezing or shortness of breath (coughing fits)., Disp: 75 mL, Rfl: 1   albuterol (VENTOLIN HFA) 108 (90 Base) MCG/ACT inhaler, Inhale 2 puffs into the lungs every 4 (four) hours as needed for wheezing or shortness of breath (coughing fits)., Disp: 18 g, Rfl: 1   amLODipine (NORVASC) 10 MG tablet, Take 1 tablet (10 mg total) by mouth daily., Disp: 30  tablet, Rfl: 0   BREZTRI AEROSPHERE 160-9-4.8 MCG/ACT AERO, Inhale 2 puffs into the lungs in the morning and at bedtime. with spacer and rinse mouth afterwards., Disp: 32.1 g, Rfl: 1   buPROPion (WELLBUTRIN XL) 150 MG 24 hr tablet, Take 1 tablet (150 mg total) by mouth daily., Disp: 30 tablet, Rfl: 0   cyclobenzaprine (FLEXERIL) 10 MG tablet, Take 1 tablet (10 mg total) by mouth 2 (two) times daily as needed for muscle spasms., Disp: 20 tablet, Rfl: 0   EPINEPHrine 0.3 mg/0.3 mL IJ SOAJ injection, INJECT 0.3 MLS INTO MUSCLE, Disp: 1 each, Rfl: 2   escitalopram (LEXAPRO) 20 MG tablet, Take 1 tablet (20 mg total) by mouth daily., Disp: 30 tablet, Rfl: 0   famotidine (PEPCID) 20 MG tablet, TAKE 1 TABLET(20 MG) BY MOUTH TWICE DAILY. MAY TAKE AN ADDITION TO OMEPRAZOLE, Disp: 180 tablet, Rfl: 0   fenofibrate (TRICOR) 145 MG tablet, TAKE 1 TABLET(145 MG) BY MOUTH DAILY, Disp: 90 tablet, Rfl: 1   fluticasone (FLONASE) 50 MCG/ACT nasal spray, Place 2 sprays into both nostrils daily., Disp: 48 g, Rfl: 1   ipratropium (ATROVENT) 0.06 % nasal spray, Place 2 sprays in each nostril up to three times a day as needed, Disp: 90 mL, Rfl: 1   losartan (COZAAR) 25 MG tablet, Take 1 tablet (25 mg total) by mouth daily., Disp: 90 tablet, Rfl: 3   montelukast (SINGULAIR) 10 MG tablet, Take 1 tablet (10 mg total) by mouth at bedtime., Disp: 90 tablet, Rfl: 1   NUCALA 100 MG/ML SOAJ, INJECT 1 PEN UNDER THE SKIN EVERY 28 DAYS, Disp: 1 mL, Rfl: 11   ondansetron (ZOFRAN) 4 MG tablet, Take 1 tablet (4 mg total) by mouth every 8 (eight) hours as needed for nausea or vomiting., Disp: 40 tablet, Rfl: 0   pantoprazole (PROTONIX) 20 MG tablet, Take 1 tablet (20 mg total) by mouth daily., Disp: 30 tablet, Rfl: 3   traMADol (ULTRAM) 50 MG tablet, Take 1 tablet (50 mg total) by mouth 2 (two) times daily as needed., Disp: 30 tablet, Rfl: 0   trolamine salicylate (ASPERCREME) 10 % cream, Apply 1 Application topically as needed for muscle  pain., Disp: , Rfl:   Current Facility-Administered Medications:    Mepolizumab SOLR 100 mg, 100 mg, Subcutaneous, Q28 days, Marcelyn Bruins, MD, 100 mg at 06/13/23 1721  Observations/Objective: Patient is well-developed, well-nourished in no acute distress.  Resting comfortably  at home.  Head is normocephalic, atraumatic.  No labored breathing.  Speech is clear and coherent with logical content.  Patient is alert and oriented at baseline.    Assessment and Plan: 1. Depression, recurrent (HCC) - buPROPion (WELLBUTRIN XL) 150 MG 24 hr tablet; Take 1 tablet (150 mg total) by mouth daily.  Dispense: 30 tablet; Refill: 0 - escitalopram (LEXAPRO) 20 MG tablet; Take 1 tablet (20 mg total) by mouth daily.  Dispense: 30 tablet; Refill: 0  2. Primary hypertension  Meds refills, follow up with pcp.   Follow Up Instructions: I  discussed the assessment and treatment plan with the patient. The patient was provided an opportunity to ask questions and all were answered. The patient agreed with the plan and demonstrated an understanding of the instructions.  A copy of instructions were sent to the patient via MyChart unless otherwise noted below.     The patient was advised to call back or seek an in-person evaluation if the symptoms worsen or if the condition fails to improve as anticipated.    Georgana Curio, FNP

## 2023-07-01 NOTE — Patient Instructions (Signed)

## 2023-07-02 ENCOUNTER — Ambulatory Visit: Payer: Medicare HMO | Admitting: Sports Medicine

## 2023-07-11 ENCOUNTER — Ambulatory Visit: Payer: Medicare HMO

## 2023-07-11 ENCOUNTER — Other Ambulatory Visit: Payer: Self-pay | Admitting: Family Medicine

## 2023-07-11 ENCOUNTER — Ambulatory Visit: Payer: Medicare HMO | Admitting: Family Medicine

## 2023-07-11 DIAGNOSIS — J4550 Severe persistent asthma, uncomplicated: Secondary | ICD-10-CM

## 2023-07-12 ENCOUNTER — Telehealth: Payer: Self-pay

## 2023-07-12 NOTE — Telephone Encounter (Signed)
Ambs, Norvel Richards, FNP  [facility] Can you please call this patient and let him know that he will need a lab test before we can move forward with the biologic therapy for asthma. CBC has been ordered and he can get this at any [facility]. Thank you   Tried calling pt but voicemail box is full at this time

## 2023-07-15 NOTE — Telephone Encounter (Signed)
Tried calling pt no voicemail box is set up at this time

## 2023-07-16 NOTE — Telephone Encounter (Signed)
Called and attempted to leave a message but the recording stated that my call could not be completed as dialed. Will attempt another time.

## 2023-07-18 NOTE — Telephone Encounter (Signed)
Attempted to call patient, there was no answer and was unable to leave a voicemail.  ?

## 2023-07-31 ENCOUNTER — Encounter: Payer: Self-pay | Admitting: Nurse Practitioner

## 2023-07-31 ENCOUNTER — Telehealth: Admitting: Nurse Practitioner

## 2023-07-31 DIAGNOSIS — I10 Essential (primary) hypertension: Secondary | ICD-10-CM | POA: Diagnosis not present

## 2023-07-31 DIAGNOSIS — F339 Major depressive disorder, recurrent, unspecified: Secondary | ICD-10-CM

## 2023-07-31 MED ORDER — BUPROPION HCL ER (XL) 150 MG PO TB24: 150.0000 mg | ORAL_TABLET | Freq: Every day | ORAL | 0 refills | Status: DC

## 2023-07-31 MED ORDER — ESCITALOPRAM OXALATE 20 MG PO TABS: 20.0000 mg | ORAL_TABLET | Freq: Every day | ORAL | 0 refills | Status: DC

## 2023-07-31 MED ORDER — AMLODIPINE BESYLATE 10 MG PO TABS: 10.0000 mg | ORAL_TABLET | Freq: Every day | ORAL | 0 refills | Status: DC

## 2023-07-31 NOTE — Progress Notes (Signed)
 Virtual Visit Consent   [name], you are scheduled for a virtual visit with a [facility] provider today. Just as with appointments in the office, your consent must be obtained to participate. Your consent will be active for this visit and any virtual visit you may have with one of our providers in the next 365 days. If you have a MyChart account, a copy of this consent can be sent to you electronically.  As this is a virtual visit, video technology does not allow for your provider to perform a traditional examination. This may limit your provider's ability to fully assess your condition. If your provider identifies any concerns that need to be evaluated in person or the need to arrange testing (such as labs, EKG, etc.), we will make arrangements to do so. Although advances in technology are sophisticated, we cannot ensure that it will always work on either your end or our end. If the connection with a video visit is poor, the visit may have to be switched to a telephone visit. With either a video or telephone visit, we are not always able to ensure that we have a secure connection.  By engaging in this virtual visit, you consent to the provision of healthcare and authorize for your insurance to be billed (if applicable) for the services provided during this visit. Depending on your insurance coverage, you may receive a charge related to this service.  I need to obtain your verbal consent now. Are you willing to proceed with your visit today? [name] has provided verbal consent on 07/31/2023 for a virtual visit (video or telephone). Viviano Simas, FNP  Date: 07/31/2023 7:51 PM   Virtual Visit via Video Note   I, Viviano Simas, connected with  Jordan [name]  ([id number], 1975/10/01) on 07/31/23 at  7:45 PM EST by a video-enabled telemedicine application and verified that I am speaking with the correct person using two identifiers.  Location: Patient: Virtual Visit Location Patient:  Home Provider: Virtual Visit Location Provider: Home Office   I discussed the limitations of evaluation and management by telemedicine and the availability of in person appointments. The patient expressed understanding and agreed to proceed.    History of Present Illness: Jordan [name] is a 48 y.o. who identifies as a male who was assigned male at birth, and is being seen today for a request for medication refill   He has attempted to get a PCP and has had multiple no shows  He has Medicare and is disability  [facility] [place] is in temporary housing without transportation   He was seen by allergy and asthma in January Vitals were recorded at that time   Aside from that visit he has had multiple Video Visits for medication refills    Problems:  Patient Active Problem List   Diagnosis Date Noted   Not well controlled severe persistent asthma 06/11/2022   Non compliance with medical treatment 06/11/2022   Gastroesophageal reflux disease 06/11/2022   Recurrent infections 06/11/2022   Avascular necrosis of bone of right hip (HCC) 02/20/2022   Avascular necrosis of bone of left hip (HCC) 02/20/2022   Right groin pain 01/25/2022   Low testosterone in male 09/04/2021   Erectile dysfunction 11/02/2020   Depression, recurrent (HCC) 04/01/2020   Elevated LDL cholesterol level 07/29/2019   SOB (shortness of breath) 07/27/2019   Seasonal allergic conjunctivitis 07/20/2019   Asthma 07/20/2019   Heartburn 07/20/2019  Hypertension 07/14/2019    Allergies: No Known Allergies Medications:  Current Outpatient Medications:    albuterol (PROVENTIL) (2.5 MG/3ML) 0.083% nebulizer solution, Take 3 mLs (2.5 mg total) by nebulization every 4 (four) hours as needed for wheezing or shortness of breath (coughing fits)., Disp: 75 mL, Rfl: 1   albuterol (VENTOLIN HFA) 108 (90 Base) MCG/ACT inhaler, Inhale 2 puffs into the lungs every 4 (four) hours as needed for wheezing or  shortness of breath (coughing fits)., Disp: 18 g, Rfl: 1   amLODipine (NORVASC) 10 MG tablet, Take 1 tablet (10 mg total) by mouth daily., Disp: 30 tablet, Rfl: 0   amLODipine (NORVASC) 10 MG tablet, Take 1 tablet (10 mg total) by mouth daily., Disp: 30 tablet, Rfl: 0   BREZTRI AEROSPHERE 160-9-4.8 MCG/ACT AERO, Inhale 2 puffs into the lungs in the morning and at bedtime. with spacer and rinse mouth afterwards., Disp: 32.1 g, Rfl: 1   buPROPion (WELLBUTRIN XL) 150 MG 24 hr tablet, Take 1 tablet (150 mg total) by mouth daily., Disp: 30 tablet, Rfl: 0   cyclobenzaprine (FLEXERIL) 10 MG tablet, Take 1 tablet (10 mg total) by mouth 2 (two) times daily as needed for muscle spasms., Disp: 20 tablet, Rfl: 0   EPINEPHrine 0.3 mg/0.3 mL IJ SOAJ injection, INJECT 0.3 MLS INTO MUSCLE, Disp: 1 each, Rfl: 2   escitalopram (LEXAPRO) 20 MG tablet, Take 1 tablet (20 mg total) by mouth daily., Disp: 30 tablet, Rfl: 0   famotidine (PEPCID) 20 MG tablet, TAKE 1 TABLET(20 MG) BY MOUTH TWICE DAILY. MAY TAKE AN ADDITION TO OMEPRAZOLE, Disp: 180 tablet, Rfl: 0   fenofibrate (TRICOR) 145 MG tablet, TAKE 1 TABLET(145 MG) BY MOUTH DAILY, Disp: 90 tablet, Rfl: 1   fluticasone (FLONASE) 50 MCG/ACT nasal spray, Place 2 sprays into both nostrils daily., Disp: 48 g, Rfl: 1   ipratropium (ATROVENT) 0.06 % nasal spray, Place 2 sprays in each nostril up to three times a day as needed, Disp: 90 mL, Rfl: 1   losartan (COZAAR) 25 MG tablet, Take 1 tablet (25 mg total) by mouth daily., Disp: 90 tablet, Rfl: 3   montelukast (SINGULAIR) 10 MG tablet, Take 1 tablet (10 mg total) by mouth at bedtime., Disp: 90 tablet, Rfl: 1   NUCALA 100 MG/ML SOAJ, INJECT 1 PEN UNDER THE SKIN EVERY 28 DAYS, Disp: 1 mL, Rfl: 11   ondansetron (ZOFRAN) 4 MG tablet, Take 1 tablet (4 mg total) by mouth every 8 (eight) hours as needed for nausea or vomiting., Disp: 40 tablet, Rfl: 0   pantoprazole (PROTONIX) 20 MG tablet, Take 1 tablet (20 mg total) by mouth  daily., Disp: 30 tablet, Rfl: 3   traMADol (ULTRAM) 50 MG tablet, Take 1 tablet (50 mg total) by mouth 2 (two) times daily as needed., Disp: 30 tablet, Rfl: 0   trolamine salicylate (ASPERCREME) 10 % cream, Apply 1 Application topically as needed for muscle pain., Disp: , Rfl:   Current Facility-Administered Medications:    Mepolizumab SOLR 100 mg, 100 mg, Subcutaneous, Q28 days, Marcelyn Bruins, MD, 100 mg at 06/13/23 1721  Observations/Objective: Patient is well-developed, well-nourished in no acute distress.  Resting comfortably  at home.  Head is normocephalic, atraumatic.  No labored breathing.  Speech is clear and coherent with logical content.  Patient is alert and oriented at baseline.    Assessment and Plan:  Offered next day appointment with [facility] - patient is in need of transportation  Will call patient tomorrow to attempt  arrangement for follow up appointment   1. Depression, recurrent (HCC)  - buPROPion (WELLBUTRIN XL) 150 MG 24 hr tablet; Take 1 tablet (150 mg total) by mouth daily.  Dispense: 30 tablet; Refill: 0 - escitalopram (LEXAPRO) 20 MG tablet; Take 1 tablet (20 mg total) by mouth daily.  Dispense: 30 tablet; Refill: 0  2. Primary hypertension  - amLODipine (NORVASC) 10 MG tablet; Take 1 tablet (10 mg total) by mouth daily.  Dispense: 30 tablet; Refill: 0     Follow Up Instructions: I discussed the assessment and treatment plan with the patient. The patient was provided an opportunity to ask questions and all were answered. The patient agreed with the plan and demonstrated an understanding of the instructions.  A copy of instructions were sent to the patient via MyChart unless otherwise noted below.    The patient was advised to call back or seek an in-person evaluation if the symptoms worsen or if the condition fails to improve as anticipated.    Viviano Simas, FNP

## 2023-08-23 ENCOUNTER — Ambulatory Visit

## 2023-08-28 ENCOUNTER — Other Ambulatory Visit: Payer: Self-pay | Admitting: *Deleted

## 2023-08-28 ENCOUNTER — Ambulatory Visit: Admitting: *Deleted

## 2023-08-28 DIAGNOSIS — J4550 Severe persistent asthma, uncomplicated: Secondary | ICD-10-CM

## 2023-08-28 MED ORDER — BREZTRI AEROSPHERE 160-9-4.8 MCG/ACT IN AERO: 2.0000 | INHALATION_SPRAY | Freq: Two times a day (BID) | RESPIRATORY_TRACT | 1 refills | Status: DC

## 2023-08-28 MED ORDER — PANTOPRAZOLE SODIUM 20 MG PO TBEC: 20.0000 mg | DELAYED_RELEASE_TABLET | Freq: Every day | ORAL | 3 refills | Status: DC

## 2023-08-28 MED ORDER — ALBUTEROL SULFATE HFA 108 (90 BASE) MCG/ACT IN AERS: 2.0000 | INHALATION_SPRAY | RESPIRATORY_TRACT | 1 refills | Status: DC | PRN

## 2023-09-17 ENCOUNTER — Telehealth: Admitting: Family Medicine

## 2023-09-17 DIAGNOSIS — F339 Major depressive disorder, recurrent, unspecified: Secondary | ICD-10-CM | POA: Diagnosis not present

## 2023-09-17 DIAGNOSIS — I10 Essential (primary) hypertension: Secondary | ICD-10-CM

## 2023-09-17 DIAGNOSIS — F419 Anxiety disorder, unspecified: Secondary | ICD-10-CM | POA: Diagnosis not present

## 2023-09-17 DIAGNOSIS — F32A Depression, unspecified: Secondary | ICD-10-CM

## 2023-09-17 MED ORDER — ESCITALOPRAM OXALATE 20 MG PO TABS: 20.0000 mg | ORAL_TABLET | Freq: Every day | ORAL | 0 refills | Status: DC

## 2023-09-17 MED ORDER — BUPROPION HCL ER (XL) 150 MG PO TB24: 150.0000 mg | ORAL_TABLET | Freq: Every day | ORAL | 0 refills | Status: DC

## 2023-09-17 MED ORDER — AMLODIPINE BESYLATE 10 MG PO TABS
10.0000 mg | ORAL_TABLET | Freq: Every day | ORAL | 0 refills | Status: DC
Start: 2023-09-17 — End: 2023-11-04

## 2023-09-17 NOTE — Patient Instructions (Signed)

## 2023-09-17 NOTE — Progress Notes (Signed)
 Virtual Visit Consent   Jordan [name], you are scheduled for a virtual visit with a [facility] provider today. Just as with appointments in the office, your consent must be obtained to participate. Your consent will be active for this visit and any virtual visit you may have with one of our providers in the next 365 days. If you have a MyChart account, a copy of this consent can be sent to you electronically.  As this is a virtual visit, video technology does not allow for your provider to perform a traditional examination. This may limit your provider's ability to fully assess your condition. If your provider identifies any concerns that need to be evaluated in person or the need to arrange testing (such as labs, EKG, etc.), we will make arrangements to do so. Although advances in technology are sophisticated, we cannot ensure that it will always work on either your end or our end. If the connection with a video visit is poor, the visit may have to be switched to a telephone visit. With either a video or telephone visit, we are not always able to ensure that we have a secure connection.  By engaging in this virtual visit, you consent to the provision of healthcare and authorize for your insurance to be billed (if applicable) for the services provided during this visit. Depending on your insurance coverage, you may receive a charge related to this service.  I need to obtain your verbal consent now. Are you willing to proceed with your visit today? Jordan [name] has provided verbal consent on 09/17/2023 for a virtual visit (video or telephone). Albertha Huger, FNP  Date: 09/17/2023 5:59 PM   Virtual Visit via Video Note   I, Albertha Huger, connected with  Jordan [name]  ([id number], 1976/01/14) on 09/17/23 at  5:45 PM EDT by a video-enabled telemedicine application and verified that I am speaking with the correct person using two identifiers.  Location: Patient: Virtual Visit Location Patient:  Home Provider: Virtual Visit Location Provider: Home Office   I discussed the limitations of evaluation and management by telemedicine and the availability of in person appointments. The patient expressed understanding and agreed to proceed.    History of Present Illness: Jordan [name] is a 48 y.o. who identifies as a male who was assigned male at birth, and is being seen today for anxiety, headache, nervousness and nausea. He has been out of meds for a week and feel sick. He says he is waiting on apptmt with pcp. Denies suicidal and homicidal thoughts. Aaron Aas  HPI: HPI  Problems:  Patient Active Problem List   Diagnosis Date Noted   Not well controlled severe persistent asthma 06/11/2022   Non compliance with medical treatment 06/11/2022   Gastroesophageal reflux disease 06/11/2022   Recurrent infections 06/11/2022   Avascular necrosis of bone of right hip (HCC) 02/20/2022   Avascular necrosis of bone of left hip (HCC) 02/20/2022   Right groin pain 01/25/2022   Low testosterone  in male 09/04/2021   Erectile dysfunction 11/02/2020   Depression, recurrent (HCC) 04/01/2020   Elevated LDL cholesterol level 07/29/2019   SOB (shortness of breath) 07/27/2019   Seasonal allergic conjunctivitis 07/20/2019   Asthma 07/20/2019   Heartburn 07/20/2019   Hypertension 07/14/2019    Allergies: No Known Allergies Medications:  Current Outpatient Medications:    albuterol  (PROVENTIL ) (2.5 MG/3ML) 0.083% nebulizer solution, Take 3 mLs (2.5 mg total) by nebulization every 4 (four) hours as needed  for wheezing or shortness of breath (coughing fits)., Disp: 75 mL, Rfl: 1   albuterol  (VENTOLIN  HFA) 108 (90 Base) MCG/ACT inhaler, Inhale 2 puffs into the lungs every 4 (four) hours as needed for wheezing or shortness of breath (coughing fits)., Disp: 18 g, Rfl: 1   amLODipine  (NORVASC ) 10 MG tablet, Take 1 tablet (10 mg total) by mouth daily., Disp: 30 tablet, Rfl: 0   amLODipine  (NORVASC ) 10 MG tablet, Take  1 tablet (10 mg total) by mouth daily., Disp: 30 tablet, Rfl: 0   BREZTRI  AEROSPHERE 160-9-4.8 MCG/ACT AERO, Inhale 2 puffs into the lungs in the morning and at bedtime. with spacer and rinse mouth afterwards., Disp: 32.1 g, Rfl: 1   buPROPion  (WELLBUTRIN  XL) 150 MG 24 hr tablet, Take 1 tablet (150 mg total) by mouth daily., Disp: 30 tablet, Rfl: 0   cyclobenzaprine  (FLEXERIL ) 10 MG tablet, Take 1 tablet (10 mg total) by mouth 2 (two) times daily as needed for muscle spasms., Disp: 20 tablet, Rfl: 0   EPINEPHrine  0.3 mg/0.3 mL IJ SOAJ injection, INJECT 0.3 MLS INTO MUSCLE, Disp: 1 each, Rfl: 2   escitalopram  (LEXAPRO ) 20 MG tablet, Take 1 tablet (20 mg total) by mouth daily., Disp: 30 tablet, Rfl: 0   famotidine  (PEPCID ) 20 MG tablet, TAKE 1 TABLET(20 MG) BY MOUTH TWICE DAILY. MAY TAKE AN ADDITION TO OMEPRAZOLE , Disp: 180 tablet, Rfl: 0   fenofibrate  (TRICOR ) 145 MG tablet, TAKE 1 TABLET(145 MG) BY MOUTH DAILY, Disp: 90 tablet, Rfl: 1   fluticasone  (FLONASE ) 50 MCG/ACT nasal spray, Place 2 sprays into both nostrils daily., Disp: 48 g, Rfl: 1   ipratropium (ATROVENT ) 0.06 % nasal spray, Place 2 sprays in each nostril up to three times a day as needed, Disp: 90 mL, Rfl: 1   losartan  (COZAAR ) 25 MG tablet, Take 1 tablet (25 mg total) by mouth daily., Disp: 90 tablet, Rfl: 3   montelukast  (SINGULAIR ) 10 MG tablet, Take 1 tablet (10 mg total) by mouth at bedtime., Disp: 90 tablet, Rfl: 1   NUCALA  100 MG/ML SOAJ, INJECT 1 PEN UNDER THE SKIN EVERY 28 DAYS, Disp: 1 mL, Rfl: 11   ondansetron  (ZOFRAN ) 4 MG tablet, Take 1 tablet (4 mg total) by mouth every 8 (eight) hours as needed for nausea or vomiting., Disp: 40 tablet, Rfl: 0   pantoprazole  (PROTONIX ) 20 MG tablet, Take 1 tablet (20 mg total) by mouth daily., Disp: 30 tablet, Rfl: 3   traMADol  (ULTRAM ) 50 MG tablet, Take 1 tablet (50 mg total) by mouth 2 (two) times daily as needed., Disp: 30 tablet, Rfl: 0   trolamine salicylate (ASPERCREME) 10 % cream,  Apply 1 Application topically as needed for muscle pain., Disp: , Rfl:   Current Facility-Administered Medications:    Mepolizumab  SOLR 100 mg, 100 mg, Subcutaneous, Q28 days, Brian Campanile, MD, 100 mg at 08/28/23 1616  Observations/Objective: Patient is well-developed, well-nourished in no acute distress.  Resting comfortably  at home.  Head is normocephalic, atraumatic.  No labored breathing.  Speech is clear and coherent with logical content.  Patient is alert and oriented at baseline.    Assessment and Plan: 1. Depression, recurrent (HCC) - buPROPion  (WELLBUTRIN  XL) 150 MG 24 hr tablet; Take 1 tablet (150 mg total) by mouth daily.  Dispense: 30 tablet; Refill: 0 - escitalopram  (LEXAPRO ) 20 MG tablet; Take 1 tablet (20 mg total) by mouth daily.  Dispense: 30 tablet; Refill: 0  2. Anxiety and depression (Primary)  3. Hypertension, unspecified  type  Pt is given the number on chart for virtual primary care to call for apptmt. Discussed he has to see them this month or go to urgent care. We will not refill any more on urgent telehealth.   Follow Up Instructions: I discussed the assessment and treatment plan with the patient. The patient was provided an opportunity to ask questions and all were answered. The patient agreed with the plan and demonstrated an understanding of the instructions.  A copy of instructions were sent to the patient via MyChart unless otherwise noted below.     The patient was advised to call back or seek an in-person evaluation if the symptoms worsen or if the condition fails to improve as anticipated.    Shannen Flansburg, FNP

## 2023-09-23 ENCOUNTER — Ambulatory Visit: Admitting: Nurse Practitioner

## 2023-09-25 ENCOUNTER — Ambulatory Visit

## 2023-09-26 ENCOUNTER — Telehealth: Admitting: Emergency Medicine

## 2023-10-03 ENCOUNTER — Ambulatory Visit: Admitting: Emergency Medicine

## 2023-10-03 ENCOUNTER — Encounter: Payer: Self-pay | Admitting: Nurse Practitioner

## 2023-10-07 ENCOUNTER — Ambulatory Visit: Admitting: Nurse Practitioner

## 2023-10-07 ENCOUNTER — Telehealth: Admitting: Nurse Practitioner

## 2023-10-07 DIAGNOSIS — J4550 Severe persistent asthma, uncomplicated: Secondary | ICD-10-CM | POA: Diagnosis not present

## 2023-10-07 DIAGNOSIS — T7840XA Allergy, unspecified, initial encounter: Secondary | ICD-10-CM

## 2023-10-07 DIAGNOSIS — F332 Major depressive disorder, recurrent severe without psychotic features: Secondary | ICD-10-CM

## 2023-10-07 DIAGNOSIS — F419 Anxiety disorder, unspecified: Secondary | ICD-10-CM | POA: Diagnosis not present

## 2023-10-07 DIAGNOSIS — J4540 Moderate persistent asthma, uncomplicated: Secondary | ICD-10-CM

## 2023-10-07 DIAGNOSIS — K219 Gastro-esophageal reflux disease without esophagitis: Secondary | ICD-10-CM | POA: Diagnosis not present

## 2023-10-07 MED ORDER — PANTOPRAZOLE SODIUM 20 MG PO TBEC: 20.0000 mg | DELAYED_RELEASE_TABLET | Freq: Every day | ORAL | 3 refills | Status: DC

## 2023-10-07 MED ORDER — ALBUTEROL SULFATE HFA 108 (90 BASE) MCG/ACT IN AERS: 2.0000 | INHALATION_SPRAY | RESPIRATORY_TRACT | 1 refills | Status: DC | PRN

## 2023-10-07 MED ORDER — BREZTRI AEROSPHERE 160-9-4.8 MCG/ACT IN AERO: 2.0000 | INHALATION_SPRAY | Freq: Two times a day (BID) | RESPIRATORY_TRACT | 1 refills | Status: DC

## 2023-10-07 MED ORDER — HYDROXYZINE PAMOATE 25 MG PO CAPS: 25.0000 mg | ORAL_CAPSULE | Freq: Every day | ORAL | 0 refills | Status: AC

## 2023-10-07 NOTE — Progress Notes (Signed)
 New Patient Office Visit  Virtual Visit Consent:   ZAKARIYYA CHILSON IV, you are scheduled for a virtual visit with a [facility] provider today.     Just as with appointments in the office, your consent must be obtained to participate.  Your consent will be active for this visit and any virtual visit you may have with one of our providers in the next 365 days.     If you have a MyChart account, a copy of this consent can be sent to you electronically.  All virtual visits are billed to your insurance company just like a traditional visit in the office.    As this is a virtual visit, video technology does not allow for your provider to perform a traditional examination.  This may limit your provider's ability to fully assess your condition.  If your provider identifies any concerns that need to be evaluated in person or the need to arrange testing (such as labs, EKG, etc.), we will make arrangements to do so.     Although advances in technology are sophisticated, we cannot ensure that it will always work on either your end or our end.  If the connection with a video visit is poor, the visit may have to be switched to a telephone visit.  With either a video or telephone visit, we are not always able to ensure that we have a secure connection.     I need to obtain your verbal consent now.   Are you willing to proceed with your visit today?    Avel Leiter IV has provided verbal consent on 10/07/2023 for a virtual visit (video or telephone).   Mardene Shake, FNP  Date: 10/07/2023 12:58 PM  SUBJECTIVE    Patient ID: Refugia Canton, male    DOB: 1976-05-21  Age: 48 y.o. MRN: [id number]  Milderd Alken, connected with  ANIKETH GRASSL IV  ([id number], 1976/03/20) on 10/07/23 at  1:20 PM EDT by a video-enabled telemedicine application and verified that I am speaking with the correct person using two identifiers.  Location:  Patient: Home  Provider: Virtual Visit Location Provider: Home   I  discussed the limitations of evaluation and management by telemedicine and the availability of in person appointments. The patient expressed understanding and agreed to proceed.     HPI LAIF KOHLHOFF IV is a 48 y.o. who identifies as a male who was assigned male at birth, and is being seen today for anxiety   He has been going through a traumatic even where his wife left and took 3 of his kids with her. He has moved to [place] , [place] where he has support though he currently has no transportation.  He was a patient at [facility] in [place] previously for his asthma care   Has also required management for HTN, anxiety and GERD in the past   Today he is complaining that  He gets frequent headaches and has frequent nightmares   He has also felt like he is having spasms in his hands and that he will just start to cry at any given moment without reason    Meds are updated below  He has been in Singulair  and feels that this has worsened his nightmares   Denies any SI/HI      Current Outpatient Medications:    hydrOXYzine (VISTARIL) 25 MG capsule, Take 1 capsule (25 mg total) by mouth at bedtime., Disp: 90 capsule, Rfl: 0  albuterol  (PROVENTIL ) (2.5 MG/3ML) 0.083% nebulizer solution, Take 3 mLs (2.5 mg total) by nebulization every 4 (four) hours as needed for wheezing or shortness of breath (coughing fits)., Disp: 75 mL, Rfl: 1   albuterol  (VENTOLIN  HFA) 108 (90 Base) MCG/ACT inhaler, Inhale 2 puffs into the lungs every 4 (four) hours as needed for wheezing or shortness of breath (coughing fits)., Disp: 18 g, Rfl: 1   amLODipine  (NORVASC ) 10 MG tablet, Take 1 tablet (10 mg total) by mouth daily., Disp: 30 tablet, Rfl: 0   amLODipine  (NORVASC ) 10 MG tablet, Take 1 tablet (10 mg total) by mouth daily., Disp: 30 tablet, Rfl: 0   BREZTRI  AEROSPHERE 160-9-4.8 MCG/ACT AERO inhaler, Inhale 2 puffs into the lungs in the morning and at bedtime. with spacer and rinse mouth afterwards., Disp:  32.1 g, Rfl: 1   buPROPion  (WELLBUTRIN  XL) 150 MG 24 hr tablet, Take 1 tablet (150 mg total) by mouth daily., Disp: 30 tablet, Rfl: 0   cyclobenzaprine  (FLEXERIL ) 10 MG tablet, Take 1 tablet (10 mg total) by mouth 2 (two) times daily as needed for muscle spasms., Disp: 20 tablet, Rfl: 0   EPINEPHrine  0.3 mg/0.3 mL IJ SOAJ injection, INJECT 0.3 MLS INTO MUSCLE, Disp: 1 each, Rfl: 2   escitalopram  (LEXAPRO ) 20 MG tablet, Take 1 tablet (20 mg total) by mouth daily., Disp: 30 tablet, Rfl: 0   famotidine  (PEPCID ) 20 MG tablet, TAKE 1 TABLET(20 MG) BY MOUTH TWICE DAILY. MAY TAKE AN ADDITION TO OMEPRAZOLE , Disp: 180 tablet, Rfl: 0   fenofibrate  (TRICOR ) 145 MG tablet, TAKE 1 TABLET(145 MG) BY MOUTH DAILY, Disp: 90 tablet, Rfl: 1   fluticasone  (FLONASE ) 50 MCG/ACT nasal spray, Place 2 sprays into both nostrils daily., Disp: 48 g, Rfl: 1   ipratropium (ATROVENT ) 0.06 % nasal spray, Place 2 sprays in each nostril up to three times a day as needed, Disp: 90 mL, Rfl: 1   losartan  (COZAAR ) 25 MG tablet, Take 1 tablet (25 mg total) by mouth daily., Disp: 90 tablet, Rfl: 3   NUCALA  100 MG/ML SOAJ, INJECT 1 PEN UNDER THE SKIN EVERY 28 DAYS, Disp: 1 mL, Rfl: 11   ondansetron  (ZOFRAN ) 4 MG tablet, Take 1 tablet (4 mg total) by mouth every 8 (eight) hours as needed for nausea or vomiting., Disp: 40 tablet, Rfl: 0   pantoprazole  (PROTONIX ) 20 MG tablet, Take 1 tablet (20 mg total) by mouth daily., Disp: 30 tablet, Rfl: 3   traMADol  (ULTRAM ) 50 MG tablet, Take 1 tablet (50 mg total) by mouth 2 (two) times daily as needed., Disp: 30 tablet, Rfl: 0   trolamine salicylate (ASPERCREME) 10 % cream, Apply 1 Application topically as needed for muscle pain., Disp: , Rfl:   Current Facility-Administered Medications:    Mepolizumab  SOLR 100 mg, 100 mg, Subcutaneous, Q28 days, Brian Campanile, MD, 100 mg at 08/28/23 1616    Past Medical History:  Diagnosis Date   Anxiety    Asthma    Bipolar disorder (HCC)     Depression    Elevated LFTs 07/29/2019   GERD (gastroesophageal reflux disease)    Hypertension    Reflux esophagitis     Past Surgical History:  Procedure Laterality Date   NO PAST SURGERIES      Family History  Problem Relation Age of Onset   Hypertension Mother    Glaucoma Mother 16   Hypertension Father     Social History   Socioeconomic History   Marital status: Married  Spouse name: Not on file   Number of children: 4   Years of education: Not on file   Highest education level: GED or equivalent  Occupational History   Not on file  Tobacco Use   Smoking status: Never   Smokeless tobacco: Never  Vaping Use   Vaping status: Never Used  Substance and Sexual Activity   Alcohol use: Yes    Alcohol/week: 3.0 - 5.0 standard drinks of alcohol    Types: 3 - 5 Cans of beer per week   Drug use: No   Sexual activity: Yes    Birth control/protection: None  Other Topics Concern   Not on file  Social History Narrative   Not on file   Social Drivers of Health   Financial Resource Strain: Low Risk  (10/07/2023)   Overall Financial Resource Strain (CARDIA)    Difficulty of Paying Living Expenses: Not hard at all  Food Insecurity: Food Insecurity Present (10/07/2023)   Hunger Vital Sign    Worried About Running Out of Food in the Last Year: Sometimes true    Ran Out of Food in the Last Year: Sometimes true  Transportation Needs: Unmet Transportation Needs (10/07/2023)   PRAPARE - Transportation    Lack of Transportation (Medical): Yes    Lack of Transportation (Non-Medical): Yes  Physical Activity: Unknown (10/07/2023)   Exercise Vital Sign    Days of Exercise per Week: 0 days    Minutes of Exercise per Session: Not on file  Stress: Stress Concern Present (10/07/2023)   [employer] of Occupational Health - Occupational Stress Questionnaire    Feeling of Stress : Very much  Social Connections: Unknown (10/07/2023)   Social Connection and Isolation Panel [NHANES]     Frequency of Communication with Friends and Family: Once a week    Frequency of Social Gatherings with Friends and Family: Patient declined    Attends Religious Services: Patient declined    Database administrator or Organizations: No    Attends Engineer, structural: Not on file    Marital Status: Separated  Intimate Partner Violence: Not on file    Review of Systems  Constitutional: Negative.   Eyes: Negative.   Respiratory: Negative.    Skin: Negative.   Neurological:  Positive for headaches.  Psychiatric/Behavioral:  Positive for depression. The patient has insomnia.         OBJECTIVE      Physical Exam Constitutional:      Appearance: Normal appearance.  HENT:     Head: Normocephalic.     Nose: Nose normal.  Pulmonary:     Effort: Pulmonary effort is normal.  Neurological:     Mental Status: He is alert and oriented to person, place, and time.  Psychiatric:        Attention and Perception: Attention normal.        Mood and Affect: Mood is depressed.        Speech: Speech normal.        Behavior: Behavior is cooperative.        Thought Content: Thought content normal.        Cognition and Memory: Cognition normal.        Judgment: Judgment normal.         Assessment & Plan:   Problem List Items Addressed This Visit   None     Follow Up Instructions:  1. Gastroesophageal reflux disease without esophagitis   2. Moderate persistent asthma, unspecified whether complicated  3. Allergy, initial encounter   4. Anxiety   Meds ordered this encounter  Medications   albuterol  (VENTOLIN  HFA) 108 (90 Base) MCG/ACT inhaler    Sig: Inhale 2 puffs into the lungs every 4 (four) hours as needed for wheezing or shortness of breath (coughing fits).    Dispense:  18 g    Refill:  1   pantoprazole  (PROTONIX ) 20 MG tablet    Sig: Take 1 tablet (20 mg total) by mouth daily.    Dispense:  30 tablet    Refill:  3   BREZTRI  AEROSPHERE 160-9-4.8  MCG/ACT AERO inhaler    Sig: Inhale 2 puffs into the lungs in the morning and at bedtime. with spacer and rinse mouth afterwards.    Dispense:  32.1 g    Refill:  1   hydrOXYzine (VISTARIL) 25 MG capsule    Sig: Take 1 capsule (25 mg total) by mouth at bedtime.    Dispense:  90 capsule    Refill:  0     Referred to psych- though currently living outside of the area for [facility] so sent additional resources for [place] to establish with [facility].   Advised to call back if he moves to the [place] area for additional resources, but that he will need to find PCP and Psych in his area if that is where he will be residing.   Refills provided today  Stopped Singulair  due to SE    I discussed the assessment and treatment plan with the patient. The patient was provided an opportunity to ask questions and all were answered. The patient agreed with the plan and demonstrated an understanding of the instructions.  A copy of instructions were sent to the patient via MyChart unless otherwise noted below.     The patient was advised to call back or seek an in-person evaluation if the symptoms worsen or if the condition fails to improve as anticipated.    Mardene Shake, FNP  **Disclaimer: This note may have been dictated with voice recognition software. Similar sounding words can inadvertently be transcribed and this note may contain transcription errors which may not have been corrected upon publication of note.**

## 2023-10-07 NOTE — Patient Instructions (Signed)
 [facility] [place]  Behavioral Health [address] [place] , [place]  [address]   [phone]

## 2023-10-23 ENCOUNTER — Telehealth: Payer: Self-pay

## 2023-10-23 NOTE — Telephone Encounter (Signed)
 Att to contact pt about CRM no ans lvm to call to schedule Mychart appt

## 2023-10-30 ENCOUNTER — Ambulatory Visit: Payer: Self-pay

## 2023-10-30 NOTE — Telephone Encounter (Signed)
 Copied from CRM [id number]. Topic: Clinical - Red Word Triage >> Oct 30, 2023 12:38 PM Tisa Forester wrote: Red Word that prompted transfer to Nurse Triage: Anxiety flareup  Patient call back # [phone]

## 2023-10-30 NOTE — Telephone Encounter (Signed)
 FYI Only or Action Required?: FYI only for provider  Patient was last seen in primary care on 10/07/2023 by Mardene Shake, FNP. Called Nurse Triage reporting Sleeping Problem. Symptoms began several weeks ago. Interventions attempted: Nothing. Symptoms are: unchanged.  Triage Disposition: Pt only wants to establish care within [facility], [place].   Patient/caregiver understands and will follow disposition?: Reason for Disposition . Insomnia interferes with work or school  Answer Assessment - Initial Assessment Questions 1. DESCRIPTION: "Tell me about your sleeping problem."      Does not get a lot of sleep 2. ONSET: "How long have you been having trouble sleeping?" (e.g., days, weeks, months)     A couple weeks 3. RECURRENT: "Have you had sleeping problems before?"  If Yes, ask: "What happened that time?" "What helped your sleeping problem go away in the past?"      denies 4. STRESS: "Is there anything in your life that is making you feel stressed or tense?"     Yes new stress, plenty of stress 5. PAIN: "Do you have any pain that is keeping you awake?" (e.g., back pain, headache, abdomen pain)     denies 6. CAFFEINE ABUSE: "Do you drink caffeinated beverages, and how much each day?" (e.g., coffee, tea, colas)     denies 7. ALCOHOL USE OR SUBSTANCE USE (DRUG USE): "Do you drink alcohol or use any illegal drugs?"     denies 8. OTHER SYMPTOMS: "Do you have any other symptoms?"  (e.g., difficulty breathing)     Pt has persistent airway disease,  Protocols used: Insomnia-A-AH

## 2023-11-01 ENCOUNTER — Telehealth: Payer: Self-pay

## 2023-11-01 NOTE — Telephone Encounter (Signed)
 Patient called and states he is currently living in [place] and does not know until when he will be there.   He needs someone to take over his care and would like for us  to refer him to:   [facility] [place]  [facility]ology & Res[facility] [address] [place] , [place]  [address]  Phone number: [phone]

## 2023-11-04 ENCOUNTER — Telehealth: Admitting: Nurse Practitioner

## 2023-11-04 ENCOUNTER — Ambulatory Visit: Admitting: Nurse Practitioner

## 2023-11-04 DIAGNOSIS — I10 Essential (primary) hypertension: Secondary | ICD-10-CM | POA: Diagnosis not present

## 2023-11-04 DIAGNOSIS — J4540 Moderate persistent asthma, uncomplicated: Secondary | ICD-10-CM | POA: Diagnosis not present

## 2023-11-04 DIAGNOSIS — F339 Major depressive disorder, recurrent, unspecified: Secondary | ICD-10-CM | POA: Diagnosis not present

## 2023-11-04 MED ORDER — BUPROPION HCL ER (XL) 150 MG PO TB24: 150.0000 mg | ORAL_TABLET | Freq: Every day | ORAL | 0 refills | Status: DC

## 2023-11-04 MED ORDER — ALBUTEROL SULFATE HFA 108 (90 BASE) MCG/ACT IN AERS: 2.0000 | INHALATION_SPRAY | RESPIRATORY_TRACT | 1 refills | Status: DC | PRN

## 2023-11-04 MED ORDER — BREZTRI AEROSPHERE 160-9-4.8 MCG/ACT IN AERO: 2.0000 | INHALATION_SPRAY | Freq: Two times a day (BID) | RESPIRATORY_TRACT | 1 refills | Status: DC

## 2023-11-04 MED ORDER — ESCITALOPRAM OXALATE 20 MG PO TABS: 20.0000 mg | ORAL_TABLET | Freq: Every day | ORAL | 0 refills | Status: DC

## 2023-11-04 MED ORDER — HYDROCHLOROTHIAZIDE 12.5 MG PO TABS: 12.5000 mg | ORAL_TABLET | Freq: Every day | ORAL | 0 refills | Status: AC

## 2023-11-04 MED ORDER — AMLODIPINE BESYLATE 10 MG PO TABS: 10.0000 mg | ORAL_TABLET | Freq: Every day | ORAL | 0 refills | Status: DC

## 2023-11-04 MED ORDER — FLUTICASONE PROPIONATE 50 MCG/ACT NA SUSP: 2.0000 | Freq: Every day | NASAL | 1 refills | Status: DC

## 2023-11-04 NOTE — Progress Notes (Signed)
 Virtual Visit Consent   Jordan [name], you are scheduled for a virtual visit with a [facility] provider today. Just as with appointments in the office, your consent must be obtained to participate. Your consent will be active for this visit and any virtual visit you may have with one of our providers in the next 365 days. If you have a MyChart account, a copy of this consent can be sent to you electronically.  As this is a virtual visit, video technology does not allow for your provider to perform a traditional examination. This may limit your provider's ability to fully assess your condition. If your provider identifies any concerns that need to be evaluated in person or the need to arrange testing (such as labs, EKG, etc.), we will make arrangements to do so. Although advances in technology are sophisticated, we cannot ensure that it will always work on either your end or our end. If the connection with a video visit is poor, the visit may have to be switched to a telephone visit. With either a video or telephone visit, we are not always able to ensure that we have a secure connection.  By engaging in this virtual visit, you consent to the provision of healthcare and authorize for your insurance to be billed (if applicable) for the services provided during this visit. Depending on your insurance coverage, you may receive a charge related to this service.  I need to obtain your verbal consent now. Are you willing to proceed with your visit today? Jordan [name] has provided verbal consent on 11/04/2023 for a virtual visit (video or telephone). Jordan Shake, FNP  Date: 11/04/2023 2:05 PM   Virtual Visit via Video Note   I, Jordan [name], connected with  Jordan [name]  ([id number], 09-10-1975) on 11/04/23 at  2:00 PM EDT by a video-enabled telemedicine application and verified that I am speaking with the correct person using two identifiers.  Location: Patient: Virtual Visit Location  Patient: Home Provider: Virtual Visit Location Provider: Home Office   I discussed the limitations of evaluation and management by telemedicine and the availability of in person appointments. The patient expressed understanding and agreed to proceed.    History of Present Illness: Jordan [name] is a 48 y.o. who identifies as a male who was assigned male at birth, and is being seen today for ongoing care. The patient has been seen frequently in Virtual Urgent Care. We have attempted to establish care in the Virtual Primary Care setting to assist with transportation needs, and while he has had multiple living situations in the past months. He has moved from [place], [place] to [place], [place] to [place] , [place] and is now residing in [place], [place]. He also says he is helping with his mother right now and is going between his home and [place] to help his mother.   He does have a BP machine at home and during the visit today his BP was 141/81 Last recorded CBC/chemistry was from 03/2022 only abnormal was glucose@100 . Last lipid panel was from 6/22 with elevated Total Cholesterol and triglycerides (217/270)    He has a significant PMH of allergies and severe persistent asthma for which he is a patient at [facility] in [place]. He states that he recently lost his inhalers   He also has a significant history of depression for which he was been on several medications in the past. Has  had referrals to Psychiatry and Counseling prior as well. Due to his frequent change in location he has been lost to follow up and has had frequent no show visits with multiple providers. He was referred back to [facility] psychiatry after his last visit, but states today that he was able to connect with [facility] a local BH agency in [place].    He expresses stressors over living, family support, loss of custody of his children and not knowing where they are. He has trouble sleeping, and is  worried a lot of the time.   Avascular necrosis of right hip, orthopedics was treating him with injections, most recently seen one year ago (10/2022)   He has noted that he has recently noted swelling in his feet that is mostly notable at night. He has trouble walking sometimes. The swelling will go between both feet, sometimes one more than the other. They are OK in the morning and then start to swell throughout the day more so when he is sitting.   He is requesting refills on his medications today to bridge him to his doctor's appointment to establish care with a PCP in [place] on the 17th of June    Problems:  Patient Active Problem List   Diagnosis Date Noted   Severe episode of recurrent major depressive disorder, without psychotic features (HCC) 10/07/2023   Not well controlled severe persistent asthma 06/11/2022   Non compliance with medical treatment 06/11/2022   Gastroesophageal reflux disease 06/11/2022   Recurrent infections 06/11/2022   Avascular necrosis of bone of right hip (HCC) 02/20/2022   Avascular necrosis of bone of left hip (HCC) 02/20/2022   Right groin pain 01/25/2022   Low testosterone  in male 09/04/2021   Erectile dysfunction 11/02/2020   Depression, recurrent (HCC) 04/01/2020   Elevated LDL cholesterol level 07/29/2019   SOB (shortness of breath) 07/27/2019   Seasonal allergic conjunctivitis 07/20/2019   Asthma 07/20/2019   Heartburn 07/20/2019   Hypertension 07/14/2019    Allergies: No Known Allergies Medications:  Current Outpatient Medications:    albuterol  (PROVENTIL ) (2.5 MG/3ML) 0.083% nebulizer solution, Take 3 mLs (2.5 mg total) by nebulization every 4 (four) hours as needed for wheezing or shortness of breath (coughing fits)., Disp: 75 mL, Rfl: 1   albuterol  (VENTOLIN  HFA) 108 (90 Base) MCG/ACT inhaler, Inhale 2 puffs into the lungs every 4 (four) hours as needed for wheezing or shortness of breath (coughing fits)., Disp: 18 g, Rfl: 1    amLODipine  (NORVASC ) 10 MG tablet, Take 1 tablet (10 mg total) by mouth daily., Disp: 30 tablet, Rfl: 0   BREZTRI  AEROSPHERE 160-9-4.8 MCG/ACT AERO inhaler, Inhale 2 puffs into the lungs in the morning and at bedtime. with spacer and rinse mouth afterwards., Disp: 32.1 g, Rfl: 1   buPROPion  (WELLBUTRIN  XL) 150 MG 24 hr tablet, Take 1 tablet (150 mg total) by mouth daily., Disp: 30 tablet, Rfl: 0   cyclobenzaprine  (FLEXERIL ) 10 MG tablet, Take 1 tablet (10 mg total) by mouth 2 (two) times daily as needed for muscle spasms., Disp: 20 tablet, Rfl: 0   EPINEPHrine  0.3 mg/0.3 mL IJ SOAJ injection, INJECT 0.3 MLS INTO MUSCLE, Disp: 1 each, Rfl: 2   escitalopram  (LEXAPRO ) 20 MG tablet, Take 1 tablet (20 mg total) by mouth daily., Disp: 30 tablet, Rfl: 0   famotidine  (PEPCID ) 20 MG tablet, TAKE 1 TABLET(20 MG) BY MOUTH TWICE DAILY. MAY TAKE AN ADDITION TO OMEPRAZOLE , Disp: 180 tablet, Rfl: 0   fenofibrate  (TRICOR ) 145 MG  tablet, TAKE 1 TABLET(145 MG) BY MOUTH DAILY, Disp: 90 tablet, Rfl: 1   fluticasone  (FLONASE ) 50 MCG/ACT nasal spray, Place 2 sprays into both nostrils daily., Disp: 48 g, Rfl: 1   hydrOXYzine  (VISTARIL ) 25 MG capsule, Take 1 capsule (25 mg total) by mouth at bedtime., Disp: 90 capsule, Rfl: 0   ipratropium (ATROVENT ) 0.06 % nasal spray, Place 2 sprays in each nostril up to three times a day as needed, Disp: 90 mL, Rfl: 1   losartan  (COZAAR ) 25 MG tablet, Take 1 tablet (25 mg total) by mouth daily., Disp: 90 tablet, Rfl: 3   NUCALA  100 MG/ML SOAJ, INJECT 1 PEN UNDER THE SKIN EVERY 28 DAYS, Disp: 1 mL, Rfl: 11   ondansetron  (ZOFRAN ) 4 MG tablet, Take 1 tablet (4 mg total) by mouth every 8 (eight) hours as needed for nausea or vomiting., Disp: 40 tablet, Rfl: 0   pantoprazole  (PROTONIX ) 20 MG tablet, Take 1 tablet (20 mg total) by mouth daily., Disp: 30 tablet, Rfl: 3   traMADol  (ULTRAM ) 50 MG tablet, Take 1 tablet (50 mg total) by mouth 2 (two) times daily as needed., Disp: 30 tablet, Rfl: 0    trolamine salicylate (ASPERCREME) 10 % cream, Apply 1 Application topically as needed for muscle pain., Disp: , Rfl:   Current Facility-Administered Medications:    Mepolizumab  SOLR 100 mg, 100 mg, Subcutaneous, Q28 days, Brian Campanile, MD, 100 mg at 08/28/23 1616  Observations/Objective: Patient is well-developed, well-nourished in no acute distress.  Resting comfortably  at home.  Head is normocephalic, atraumatic.  No labored breathing.  Speech is clear and coherent with logical content.  Patient is alert and oriented at baseline.    Assessment and Plan: 1. Depression, recurrent (HCC)  2. Primary hypertension  3. Moderate persistent asthma, unspecified whether complicated   Adding hydrochlorothiazide today with strict follow up precautions with PCP next week for lab check.  Advised to stop with any low blood pressure readings, less than or equal to 100/70, dizziness, lightheaded  He is encouraged to follow up with [facility] or [facility] with any acute needs prior to establishing with care next week in [place]  Advised hydration, keeping legs elevated when possible and seeking urgent care if swelling become permament/ worsens   Patient may need to transition asthma and allergy care as well, encouraged to discuss at PCP's appointment   He is now well aware of the care available in the [place] area   Meds ordered this encounter  Medications   buPROPion  (WELLBUTRIN  XL) 150 MG 24 hr tablet    Sig: Take 1 tablet (150 mg total) by mouth daily.    Dispense:  30 tablet    Refill:  0    One-time refill. Needs PCP follow-up for any additional medication   escitalopram  (LEXAPRO ) 20 MG tablet    Sig: Take 1 tablet (20 mg total) by mouth daily.    Dispense:  30 tablet    Refill:  0    One-time refill. Needs PCP follow-up for any additional medication   amLODipine  (NORVASC ) 10 MG tablet    Sig: Take 1 tablet (10 mg total) by mouth daily.    Dispense:  30 tablet    Refill:  0    albuterol  (VENTOLIN  HFA) 108 (90 Base) MCG/ACT inhaler    Sig: Inhale 2 puffs into the lungs every 4 (four) hours as needed for wheezing or shortness of breath (coughing fits).    Dispense:  18 g  Refill:  1   BREZTRI  AEROSPHERE 160-9-4.8 MCG/ACT AERO inhaler    Sig: Inhale 2 puffs into the lungs in the morning and at bedtime. with spacer and rinse mouth afterwards.    Dispense:  32.1 g    Refill:  1   fluticasone  (FLONASE ) 50 MCG/ACT nasal spray    Sig: Place 2 sprays into both nostrils daily.    Dispense:  48 g    Refill:  1   hydrochlorothiazide (HYDRODIURIL) 12.5 MG tablet    Sig: Take 1 tablet (12.5 mg total) by mouth daily.    Dispense:  90 tablet    Refill:  0     Emphasized the importance of in person evaluation and for lab collection to determine best care.   He is scheduled to see a primary care in [place] on June 17th in [place].  He is also getting set up with [facility] in [place] he is registering online to get set up   Patient may follow up with [facility] as needed, if care is esatblished in [place] and [place], [place] becomes his more permament address we will transition all care to those teams.      Assessment & Plan:   Problem List Items Addressed This Visit       Cardiovascular and Mediastinum   Hypertension     Respiratory   Asthma     Other   Depression, recurrent (HCC)      Follow Up Instructions:  I discussed the assessment and treatment plan with the patient. The patient was provided an opportunity to ask questions and all were answered. The patient agreed with the plan and demonstrated an understanding of the instructions.  A copy of instructions were sent to the patient via MyChart unless otherwise noted below.    The patient was advised to call back or seek an in-person evaluation if the symptoms worsen or if the condition fails to improve as anticipated.   Jordan Shake, FNP  **Disclaimer: This note may have been  dictated with voice recognition software. Similar sounding words can inadvertently be transcribed and this note may contain transcription errors which may not have been corrected upon publication of note.**

## 2023-11-12 DIAGNOSIS — I10 Essential (primary) hypertension: Secondary | ICD-10-CM | POA: Diagnosis not present

## 2023-11-12 DIAGNOSIS — K219 Gastro-esophageal reflux disease without esophagitis: Secondary | ICD-10-CM | POA: Diagnosis not present

## 2023-11-12 DIAGNOSIS — E039 Hypothyroidism, unspecified: Secondary | ICD-10-CM | POA: Diagnosis not present

## 2023-12-12 DIAGNOSIS — K219 Gastro-esophageal reflux disease without esophagitis: Secondary | ICD-10-CM | POA: Diagnosis not present

## 2023-12-12 DIAGNOSIS — E039 Hypothyroidism, unspecified: Secondary | ICD-10-CM | POA: Diagnosis not present

## 2023-12-12 DIAGNOSIS — I10 Essential (primary) hypertension: Secondary | ICD-10-CM | POA: Diagnosis not present

## 2023-12-12 DIAGNOSIS — F419 Anxiety disorder, unspecified: Secondary | ICD-10-CM | POA: Diagnosis not present

## 2024-01-23 DIAGNOSIS — J4550 Severe persistent asthma, uncomplicated: Secondary | ICD-10-CM | POA: Diagnosis not present

## 2024-02-08 NOTE — Progress Notes (Signed)
 Erroneous Encounter

## 2024-02-17 NOTE — Progress Notes (Incomplete Revision)
 The patient presented for a primary care visit on 10/07/23 to establish care.  During the appointment, the patient reported  (SDOH). As a precaution, the patient was provided with (blank) SDOH resources.  A review of the patient's chart revealed that they do  currently have a primary care provider (PCP) and no future appointments were indicated. At this time, no additional support from the Health Equity team is necessary. /An additional follow up will be done according to the health equity team's protocol. First call attempt left a vm.     Use may 12th visit. Pt nvr came in person , no test results, pt has housing, food and transportation sdoh, pt has a pcp and insurance. Call about sdoh. [phone], [phone]. Dont think pt lives in [place].  [place] form pcp  you moved lives near [place], had an office visit in [place]  [place].

## 2024-02-17 NOTE — Progress Notes (Addendum)
 The patient presented for a virtual primary care visit on 10/07/23 to establish care.  During the appointment, the patient reported  housing, food and transportation (SDOH).   A review of the patient's chart revealed that they do currently have a primary care provider (PCP) and no future appointments were indicated. Chart review also indicated that the pt is receiving care at [facility] in [place].  \CHW called pt two times with no answer and a VM was left. CHW spoke with current pcp in chart as well as CMA and they stated that the pt has since moved and no longer lives in the area. A letter was sent to pt letting them know to reach back out if they move back in the county or city.  At this time, no additional support from the Health Equity team is necessary.

## 2024-03-18 LAB — COLOGUARD

## 2024-03-30 ENCOUNTER — Encounter: Payer: Self-pay | Admitting: Radiology

## 2024-04-02 DIAGNOSIS — J4550 Severe persistent asthma, uncomplicated: Secondary | ICD-10-CM | POA: Diagnosis not present

## 2024-04-03 ENCOUNTER — Telehealth: Admitting: Nurse Practitioner

## 2024-04-03 ENCOUNTER — Telehealth

## 2024-04-03 ENCOUNTER — Telehealth: Payer: Self-pay | Admitting: Nurse Practitioner

## 2024-04-03 DIAGNOSIS — J0110 Acute frontal sinusitis, unspecified: Secondary | ICD-10-CM

## 2024-04-03 DIAGNOSIS — J0140 Acute pansinusitis, unspecified: Secondary | ICD-10-CM

## 2024-04-03 DIAGNOSIS — J4540 Moderate persistent asthma, uncomplicated: Secondary | ICD-10-CM

## 2024-04-03 DIAGNOSIS — I10 Essential (primary) hypertension: Secondary | ICD-10-CM

## 2024-04-03 DIAGNOSIS — K219 Gastro-esophageal reflux disease without esophagitis: Secondary | ICD-10-CM

## 2024-04-03 MED ORDER — LOSARTAN POTASSIUM 25 MG PO TABS: 25.0000 mg | ORAL_TABLET | Freq: Every day | ORAL | 3 refills | Status: AC

## 2024-04-03 MED ORDER — AMOXICILLIN-POT CLAVULANATE 875-125 MG PO TABS: 1.0000 | ORAL_TABLET | Freq: Two times a day (BID) | ORAL | 0 refills | Status: AC

## 2024-04-03 MED ORDER — BREZTRI AEROSPHERE 160-9-4.8 MCG/ACT IN AERO: 2.0000 | INHALATION_SPRAY | Freq: Two times a day (BID) | RESPIRATORY_TRACT | 1 refills | Status: DC

## 2024-04-03 MED ORDER — AMLODIPINE BESYLATE 10 MG PO TABS: 10.0000 mg | ORAL_TABLET | Freq: Every day | ORAL | 3 refills | Status: AC

## 2024-04-03 MED ORDER — PANTOPRAZOLE SODIUM 20 MG PO TBEC: 20.0000 mg | DELAYED_RELEASE_TABLET | Freq: Every day | ORAL | 3 refills | Status: DC

## 2024-04-03 MED ORDER — ALBUTEROL SULFATE HFA 108 (90 BASE) MCG/ACT IN AERS: 2.0000 | INHALATION_SPRAY | RESPIRATORY_TRACT | 1 refills | Status: DC | PRN

## 2024-04-03 MED ORDER — FLUTICASONE PROPIONATE 50 MCG/ACT NA SUSP: 2.0000 | Freq: Every day | NASAL | 1 refills | Status: AC

## 2024-04-03 NOTE — Progress Notes (Signed)
 Acute Video Visit    Virtual Visit Consent:   [name] [name], you are scheduled for a virtual visit with a [facility] provider today.     Just as with appointments in the office, your consent must be obtained to participate.  Your consent will be active for this visit and any virtual visit you may have with one of our providers in the next 365 days.     If you have a MyChart account, a copy of this consent can be sent to you electronically.  All virtual visits are billed to your insurance company just like a traditional visit in the office.    If the connection with a video visit is poor, the visit may have to be switched to a telephone visit.  With either a video or telephone visit, we are not always able to ensure that we have a secure connection.     I need to obtain your verbal consent now.   Are you willing to proceed with your visit today?    Jordan [name] has provided verbal consent on 04/03/2024 for a virtual visit (video or telephone).   Lauraine Kitty, FNP  Date: 04/03/2024 12:04 PM  Subjective:     Patient ID: Jordan [name], male    DOB: [birth year], 48 y.o.   MRN: [id number]  LILLETTE Lauraine Kitty, connected with  Jordan [name], 08-Apr-1976) on 04/03/24 at 11:40 AM EST by a video-enabled telemedicine application and verified that I am speaking with the correct person using two identifiers.   Location: Patient: Home  Provider: Virtual Visit Location Provider: Home Office   I discussed the limitations of evaluation and management by telemedicine and the availability of in person appointments. The patient expressed understanding and agreed to proceed.    No chief complaint on file.   HPI Jordan [name] is a 49 y.o. who identifies as a male who was assigned male at birth, and is being seen today for request of medication refills.   He has [facility] a patient of [facility] was recently transitioned to a practice in [place]  [place] where he has been staying with family.    States he plans to return to [place] area, but is out of BP medication and asthma medications today  Continue to take his anxiety and depression medicine  He is also complaining of sinus congestion and pressure. Has not been using flonase , feels symptoms have been getting worse over the past week and is requesting treatment for suspected sinus infection. Denies involvement of chest or any systemic symptoms/fever         Objective:     Physical Exam Constitutional:      General: He is not in acute distress.    Appearance: He is not ill-appearing.  HENT:     Nose: Congestion present.  Pulmonary:     Effort: Pulmonary effort is normal.  Neurological:     Mental Status: He is alert and oriented to person, place, and time.  Psychiatric:        Mood and Affect: Mood normal.         Assessment & Plan:   1. Acute non-recurrent frontal sinusitis (Primary) 2. Primary hypertension  Refill of HTN medications and inhalers sent to pharmacy  Will cover with Augmentin  for 7 days and restart Flonase  for sinus congestion/sinusitis   Encouraged follow up in person at a [facility] location when back in [place] or [place] or may  continue care in [place] .   Follow Up Instructions: I discussed the assessment and treatment plan with the patient. The patient was provided an opportunity to ask questions and all were answered. The patient agreed with the plan and demonstrated an understanding of the instructions.  A copy of instructions were sent to the patient via MyChart unless otherwise noted below.    The patient was advised to call back or seek an in-person evaluation if the symptoms worsen or if the condition fails to improve as anticipated.    Lauraine Kitty, FNP  **Disclaimer: This note may have been dictated with voice recognition software. Similar sounding words can inadvertently be transcribed and this note may contain transcription errors which may not have been corrected  upon publication of note.**

## 2024-04-03 NOTE — Telephone Encounter (Signed)
 Patient called to request medication refills, he was scheduled in [facility] and for in person Monday in [facility] will transition to [facility] mychart video today

## 2024-04-06 ENCOUNTER — Ambulatory Visit: Admitting: Nurse Practitioner

## 2024-05-06 ENCOUNTER — Telehealth: Payer: Self-pay | Admitting: Allergy

## 2024-05-06 NOTE — Telephone Encounter (Addendum)
 Patient states he is currently on Tezspire through another practice. He is not interested in doing Nucala  at the moment and is not living in the area.

## 2024-05-14 ENCOUNTER — Encounter: Payer: Self-pay | Admitting: Nurse Practitioner

## 2024-05-14 ENCOUNTER — Telehealth: Admitting: Nurse Practitioner

## 2024-05-14 DIAGNOSIS — J111 Influenza due to unidentified influenza virus with other respiratory manifestations: Secondary | ICD-10-CM | POA: Diagnosis not present

## 2024-05-14 DIAGNOSIS — J029 Acute pharyngitis, unspecified: Secondary | ICD-10-CM | POA: Diagnosis not present

## 2024-06-18 ENCOUNTER — Telehealth

## 2024-06-18 ENCOUNTER — Telehealth: Admitting: Nurse Practitioner

## 2024-06-18 DIAGNOSIS — J4540 Moderate persistent asthma, uncomplicated: Secondary | ICD-10-CM

## 2024-06-18 DIAGNOSIS — F339 Major depressive disorder, recurrent, unspecified: Secondary | ICD-10-CM

## 2024-06-18 DIAGNOSIS — K219 Gastro-esophageal reflux disease without esophagitis: Secondary | ICD-10-CM | POA: Diagnosis not present

## 2024-06-18 MED ORDER — FAMOTIDINE 20 MG PO TABS: 20.0000 mg | ORAL_TABLET | Freq: Two times a day (BID) | ORAL | 0 refills | Status: AC | PRN

## 2024-06-18 MED ORDER — ESCITALOPRAM OXALATE 20 MG PO TABS: 20.0000 mg | ORAL_TABLET | Freq: Every day | ORAL | 0 refills | Status: AC

## 2024-06-18 MED ORDER — BREZTRI AEROSPHERE 160-9-4.8 MCG/ACT IN AERO: 2.0000 | INHALATION_SPRAY | Freq: Two times a day (BID) | RESPIRATORY_TRACT | 1 refills | Status: AC

## 2024-06-18 MED ORDER — PANTOPRAZOLE SODIUM 20 MG PO TBEC: 20.0000 mg | DELAYED_RELEASE_TABLET | Freq: Every day | ORAL | 3 refills | Status: AC

## 2024-06-18 MED ORDER — BUPROPION HCL ER (XL) 150 MG PO TB24: 150.0000 mg | ORAL_TABLET | Freq: Every day | ORAL | 0 refills | Status: AC

## 2024-06-18 MED ORDER — ALBUTEROL SULFATE HFA 108 (90 BASE) MCG/ACT IN AERS: 2.0000 | INHALATION_SPRAY | RESPIRATORY_TRACT | 1 refills | Status: AC | PRN

## 2024-06-18 NOTE — Progress Notes (Signed)
 "  Acute Video Visit    Virtual Visit Consent:   [name], you are scheduled for a virtual visit with a [facility] provider today.     Just as with appointments in the office, your consent must be obtained to participate.  Your consent will be active for this visit and any virtual visit you may have with one of our providers in the next 365 days.     If you have a MyChart account, a copy of this consent can be sent to you electronically.  All virtual visits are billed to your insurance company just like a traditional visit in the office.    If the connection with a video visit is poor, the visit may have to be switched to a telephone visit.  With either a video or telephone visit, we are not always able to ensure that we have a secure connection.     I need to obtain your verbal consent now.   Are you willing to proceed with your visit today?    Jordan [name] has provided verbal consent on 06/18/2024 for a virtual visit (video or telephone).   Lauraine Kitty, FNP  Date: 06/18/2024 4:54 PM  Subjective:     Patient ID: Jordan [name], male    DOB: [birth year], 49 y.o.   MRN: [id number]  Jordan [name] Lauraine Kitty, connected with  NEKHI LIWANAG [name], 03/26/1976) on 06/18/24 at  3:00 PM EST by a video-enabled telemedicine application and verified that I am speaking with the correct person using two identifiers.   Location: Patient: Home  Provider: Virtual Visit Location Provider: Home Office   I discussed the limitations of evaluation and management by telemedicine and the availability of in person appointments. The patient expressed understanding and agreed to proceed.     HPI Jordan [name] is a 49 y.o. who identifies as a male who was assigned male at birth, and is being seen today for request of medication refills.   He is still staying with family in [place] , [place]. At previous visits we have discussed his need for follow up in clinic to continue care with [facility]  Today  he is without his medications and has no way to schedule a visit with a local provider  He was seen last month in the ED for URI symptoms but no labs were drawn at that time.  We was also seen by a PCP in August of this year at [facility] in [place] .   He does not have a permanent residence at this time and care has been inconsistent due to this.  Patient feels that his anxiety and depression are well managed when he continued on his Wellbutrin  and Lexapro . Asthma is well controlled when he uses his Breztri  daily as directed and uses Albuterol  only as needed and rarely. He was previously a patient of allergy and asthma in [place] where his asthma has been well managed for years   His history is also significant for HTN, during ED visit last month his BP was found to be 162/107 and patient states he had not been taking prescribed Hypertensive medications.   Labs performed in August were specific to allergies and CBC only without recent CMP         Objective:    There were no vitals taken for this visit. BP Readings from Last 3 Encounters:  06/13/23 122/70  08/03/22 (!) 138/98  07/16/22 128/80  Physical Exam Constitutional:      General: He is not in acute distress.    Appearance: Normal appearance. He is not ill-appearing.  HENT:     Nose: Nose normal.     Mouth/Throat:     Mouth: Mucous membranes are moist.  Pulmonary:     Effort: Pulmonary effort is normal.  Musculoskeletal:     Cervical back: Neck supple.  Neurological:     Mental Status: He is alert and oriented to person, place, and time.  Psychiatric:        Mood and Affect: Mood normal.        Behavior: Behavior normal.        Thought Content: Thought content normal.        Judgment: Judgment normal.    Denies SI/HI      Assessment & Plan:    Refills provided, instructed patient to schedule follow up in [place] or [facility] when able, if residence continues in [place] , [place] he will need to transfer  care to a provider there.   In order to avoid lapse in therapeutic medication regimen a 30day refill was provided for the patient today   Problem List Items Addressed This Visit       Respiratory   Asthma   Relevant Medications   BREZTRI  AEROSPHERE 160-9-4.8 MCG/ACT AERO inhaler   albuterol  (VENTOLIN  HFA) 108 (90 Base) MCG/ACT inhaler     Digestive   Gastroesophageal reflux disease   Relevant Medications   pantoprazole  (PROTONIX ) 20 MG tablet   famotidine  (PEPCID ) 20 MG tablet     Other   Depression, recurrent   Relevant Medications   buPROPion  (WELLBUTRIN  XL) 150 MG 24 hr tablet   escitalopram  (LEXAPRO ) 20 MG tablet   Other Visit Diagnoses       GERD without esophagitis       Relevant Medications   pantoprazole  (PROTONIX ) 20 MG tablet   famotidine  (PEPCID ) 20 MG tablet       Meds ordered this encounter  Medications   buPROPion  (WELLBUTRIN  XL) 150 MG 24 hr tablet    Sig: Take 1 tablet (150 mg total) by mouth daily.    Dispense:  30 tablet    Refill:  0    One-time refill. Needs PCP follow-up for any additional medication   escitalopram  (LEXAPRO ) 20 MG tablet    Sig: Take 1 tablet (20 mg total) by mouth daily.    Dispense:  30 tablet    Refill:  0    One-time refill. Needs PCP follow-up for any additional medication   pantoprazole  (PROTONIX ) 20 MG tablet    Sig: Take 1 tablet (20 mg total) by mouth daily.    Dispense:  30 tablet    Refill:  3   BREZTRI  AEROSPHERE 160-9-4.8 MCG/ACT AERO inhaler    Sig: Inhale 2 puffs into the lungs in the morning and at bedtime. with spacer and rinse mouth afterwards.    Dispense:  32.1 g    Refill:  1   albuterol  (VENTOLIN  HFA) 108 (90 Base) MCG/ACT inhaler    Sig: Inhale 2 puffs into the lungs every 4 (four) hours as needed for wheezing or shortness of breath (coughing fits).    Dispense:  18 g    Refill:  1   famotidine  (PEPCID ) 20 MG tablet    Sig: Take 1 tablet (20 mg total) by mouth 2 (two) times daily as needed for  heartburn or indigestion.    Dispense:  180 tablet  Refill:  0    **Patient requests 90 days supply**    No follow-ups on file.  Follow Up Instructions: I discussed the assessment and treatment plan with the patient. The patient was provided an opportunity to ask questions and all were answered. The patient agreed with the plan and demonstrated an understanding of the instructions.  A copy of instructions were sent to the patient via MyChart unless otherwise noted below.    The patient was advised to call back or seek an in-person evaluation if the symptoms worsen or if the condition fails to improve as anticipated.    Lauraine Kitty, FNP  **Disclaimer: This note may have been dictated with voice recognition software. Similar sounding words can inadvertently be transcribed and this note may contain transcription errors which may not have been corrected upon publication of note.** "

## 2024-07-03 ENCOUNTER — Telehealth

## 2024-07-03 ENCOUNTER — Encounter: Payer: Self-pay | Admitting: Nurse Practitioner

## 2024-07-03 ENCOUNTER — Telehealth: Admitting: Nurse Practitioner
